# Patient Record
Sex: Male | Born: 1940 | Race: White | Hispanic: No | Marital: Married | Smoking: Never smoker
Health system: Southern US, Community
[De-identification: ages and names within clinical notes are randomized; demographics above are authoritative.]

## PROBLEM LIST (undated history)

## (undated) DIAGNOSIS — I2510 Atherosclerotic heart disease of native coronary artery without angina pectoris: Secondary | ICD-10-CM

## (undated) DIAGNOSIS — Z889 Allergy status to unspecified drugs, medicaments and biological substances status: Secondary | ICD-10-CM

## (undated) DIAGNOSIS — E559 Vitamin D deficiency, unspecified: Secondary | ICD-10-CM

## (undated) DIAGNOSIS — I10 Essential (primary) hypertension: Secondary | ICD-10-CM

## (undated) DIAGNOSIS — E785 Hyperlipidemia, unspecified: Secondary | ICD-10-CM

## (undated) DIAGNOSIS — K449 Diaphragmatic hernia without obstruction or gangrene: Secondary | ICD-10-CM

## (undated) DIAGNOSIS — N529 Male erectile dysfunction, unspecified: Secondary | ICD-10-CM

## (undated) DIAGNOSIS — I4891 Unspecified atrial fibrillation: Secondary | ICD-10-CM

## (undated) DIAGNOSIS — B359 Dermatophytosis, unspecified: Secondary | ICD-10-CM

## (undated) DIAGNOSIS — I429 Cardiomyopathy, unspecified: Secondary | ICD-10-CM

## (undated) DIAGNOSIS — K219 Gastro-esophageal reflux disease without esophagitis: Secondary | ICD-10-CM

## (undated) DIAGNOSIS — C61 Malignant neoplasm of prostate: Secondary | ICD-10-CM

## (undated) DIAGNOSIS — R7303 Prediabetes: Secondary | ICD-10-CM

## (undated) DIAGNOSIS — I739 Peripheral vascular disease, unspecified: Secondary | ICD-10-CM

## (undated) DIAGNOSIS — Z9289 Personal history of other medical treatment: Secondary | ICD-10-CM

## (undated) HISTORY — DX: Hyperlipidemia, unspecified: E78.5

## (undated) HISTORY — DX: Vitamin D deficiency, unspecified: E55.9

## (undated) HISTORY — DX: Malignant neoplasm of prostate: C61

## (undated) HISTORY — DX: Diaphragmatic hernia without obstruction or gangrene: K44.9

## (undated) HISTORY — DX: Prediabetes: R73.03

## (undated) HISTORY — PX: HERNIA REPAIR: SHX51

## (undated) HISTORY — DX: Personal history of other medical treatment: Z92.89

## (undated) HISTORY — DX: Dermatophytosis, unspecified: B35.9

## (undated) HISTORY — DX: Peripheral vascular disease, unspecified: I73.9

## (undated) HISTORY — PX: OTHER SURGICAL HISTORY: SHX169

## (undated) HISTORY — DX: Cardiomyopathy, unspecified: I42.9

## (undated) HISTORY — DX: Essential (primary) hypertension: I10

## (undated) HISTORY — DX: Atherosclerotic heart disease of native coronary artery without angina pectoris: I25.10

## (undated) HISTORY — DX: Male erectile dysfunction, unspecified: N52.9

---

## 1998-08-01 ENCOUNTER — Inpatient Hospital Stay (HOSPITAL_COMMUNITY): Admission: AD | Admit: 1998-08-01 | Discharge: 1998-08-05 | Payer: Self-pay | Admitting: Interventional Cardiology

## 1998-08-01 ENCOUNTER — Encounter: Payer: Self-pay | Admitting: Emergency Medicine

## 1998-08-01 DIAGNOSIS — I219 Acute myocardial infarction, unspecified: Secondary | ICD-10-CM

## 1998-08-01 HISTORY — DX: Acute myocardial infarction, unspecified: I21.9

## 1998-08-18 ENCOUNTER — Encounter (HOSPITAL_COMMUNITY): Admission: RE | Admit: 1998-08-18 | Discharge: 1998-11-16 | Payer: Self-pay | Admitting: Interventional Cardiology

## 2007-06-05 ENCOUNTER — Ambulatory Visit: Admission: RE | Admit: 2007-06-05 | Discharge: 2007-08-08 | Payer: Self-pay | Admitting: Radiation Oncology

## 2007-08-09 ENCOUNTER — Ambulatory Visit: Admission: RE | Admit: 2007-08-09 | Discharge: 2007-11-07 | Payer: Self-pay | Admitting: Radiation Oncology

## 2007-09-14 ENCOUNTER — Encounter: Admission: RE | Admit: 2007-09-14 | Discharge: 2007-09-14 | Payer: Self-pay | Admitting: Urology

## 2007-10-15 ENCOUNTER — Ambulatory Visit (HOSPITAL_BASED_OUTPATIENT_CLINIC_OR_DEPARTMENT_OTHER): Admission: RE | Admit: 2007-10-15 | Discharge: 2007-10-15 | Payer: Self-pay | Admitting: Urology

## 2007-11-21 ENCOUNTER — Ambulatory Visit: Admission: RE | Admit: 2007-11-21 | Discharge: 2007-12-09 | Payer: Self-pay | Admitting: Radiation Oncology

## 2010-08-30 ENCOUNTER — Encounter: Payer: Self-pay | Admitting: Internal Medicine

## 2010-09-15 NOTE — Letter (Addendum)
Summary: Letter from patient  Letter from patient   Imported By: Sherian Rein 09/10/2010 09:11:37  _____________________________________________________________________  External Attachment:    Type:   Image     Comment:   External Document  Appended Document: Letter from patient reviewed

## 2010-09-20 ENCOUNTER — Encounter (INDEPENDENT_AMBULATORY_CARE_PROVIDER_SITE_OTHER): Payer: Self-pay | Admitting: *Deleted

## 2010-09-22 ENCOUNTER — Encounter: Payer: Self-pay | Admitting: Internal Medicine

## 2010-09-29 NOTE — Letter (Signed)
Summary: [facility] Instructions  [facility]  [address] [place], [place] [address]   Phone: [phone]  Fax: [phone]       Ronald Duran    [birth year]    MRN: [id number]       Procedure Day Dorna Bloom:  Jake Shark  10/05/10     Arrival Time:   8:30AM     Procedure Time:  9:30AM     Location of Procedure:                    _ X_  [facility] (4th Floor)   PREPARATION FOR COLONOSCOPY WITH MIRALAX  Starting 5 days prior to your procedure 09/30/10 do not eat nuts, seeds, popcorn, corn, beans, peas,  salads, or any raw vegetables.  Do not take any fiber supplements (e.g. Metamucil, Citrucel, and Benefiber). ____________________________________________________________________________________________________   THE DAY BEFORE YOUR PROCEDURE         DATE: 10/04/10   DAY: MONDAY  1   Drink clear liquids the entire day-NO SOLID FOOD  2   Do not drink anything colored red or purple.  Avoid juices with pulp.  No orange juice.  3   Drink at least 64 oz. (8 glasses) of fluid/clear liquids during the day to prevent dehydration and help the prep work efficiently.  CLEAR LIQUIDS INCLUDE: Water Jello Ice Popsicles Tea (sugar ok, no milk/cream) Powdered fruit flavored drinks Coffee (sugar ok, no milk/cream) Gatorade Juice: apple, white grape, white cranberry  Lemonade Clear bullion, consomm, broth Carbonated beverages (any kind) Strained chicken noodle soup Hard Candy  4   Mix the entire bottle of Miralax with 64 oz. of Gatorade/Powerade in the morning and put in the refrigerator to chill.  5   At 3:00 pm take 2 Dulcolax/Bisacodyl tablets.  6   At 4:30 pm take one Reglan/Metoclopramide tablet.  7  Starting at 5:00 pm drink one 8 oz glass of the Miralax mixture every 15-20 minutes until you have finished drinking the entire 64 oz.  You should finish drinking prep around 7:30 or 8:00 pm.  8   If you are nauseated, you may take the 2nd Reglan/Metoclopramide  tablet at 6:30 pm.        9    At 8:00 pm take 2 more DULCOLAX/Bisacodyl tablets.     THE DAY OF YOUR PROCEDURE      DATE:  10/05/10  DAY: Jake Shark  You may drink clear liquids until 7:30AM  (2 HOURS BEFORE PROCEDURE).   MEDICATION INSTRUCTIONS  Unless otherwise instructed, you should take regular prescription medications with a small sip of water as early as possible the morning of your procedure.         OTHER INSTRUCTIONS  You will need a responsible adult at least 68 years of age to accompany you and drive you home.   This person must remain in the waiting room during your procedure.  Wear loose fitting clothing that is easily removed.  Leave jewelry and other valuables at home.  However, you may wish to bring a book to read or an iPod/MP3 player to listen to music as you wait for your procedure to start.  Remove all body piercing jewelry and leave at home.  Total time from sign-in until discharge is approximately 2-3 hours.  You should go home directly after your procedure and rest.  You can resume normal activities the day after your procedure.  The day of your procedure you should not:  Drive   Make legal decisions   Operate machinery   Drink alcohol   Return to work  You will receive specific instructions about eating, activities and medications before you leave.   The above instructions have been reviewed and explained to me by   Wyona Almas RN  September 22, 2010 9:56 AM     I fully understand and can verbalize these instructions _____________________________ Date _______

## 2010-09-29 NOTE — Miscellaneous (Signed)
Summary: [facility] Previsit/prep  Clinical Lists Changes  Medications: Added new medication of DULCOLAX 5 MG  TBEC (BISACODYL) Day before procedure take 2 at 3pm and 2 at 8pm. - Signed Added new medication of METOCLOPRAMIDE HCL 10 MG  TABS (METOCLOPRAMIDE HCL) As per prep instructions. - Signed Added new medication of MIRALAX   POWD (POLYETHYLENE GLYCOL 3350) As per prep  instructions. - Signed Rx of DULCOLAX 5 MG  TBEC (BISACODYL) Day before procedure take 2 at 3pm and 2 at 8pm.;  #4 x 0;  Signed;  Entered by: Wyona Almas RN;  Authorized by: Hart Carwin MD;  Method used: Electronically to [facility]. #[id number]*, [address] [place], [place], [place]  [address], Ph: [phone], Fax: [phone] Rx of METOCLOPRAMIDE HCL 10 MG  TABS (METOCLOPRAMIDE HCL) As per prep instructions.;  #2 x 0;  Signed;  Entered by: Wyona Almas RN;  Authorized by: Hart Carwin MD;  Method used: Electronically to [facility]. #[id number]*, [address], [place], [place], [place]  [address], Ph: [phone], Fax: [phone] Rx of MIRALAX   POWD (POLYETHYLENE GLYCOL 3350) As per prep  instructions.;  #255gm x 0;  Signed;  Entered by: Wyona Almas RN;  Authorized by: Hart Carwin MD;  Method used: Electronically to [facility] #[id number]*, [address], [place], [place], [place]  [address], Ph: [phone], Fax: [phone] Observations: Added new observation of NKA: T (09/22/2010 9:26)    Prescriptions: MIRALAX   POWD (POLYETHYLENE GLYCOL 3350) As per prep  instructions.  #255gm x 0   Entered by:   Wyona Almas RN   Authorized by:   Hart Carwin MD   Signed by:   Wyona Almas RN on 09/22/2010   Method used:   Electronically to        [facility]. [phone]* (retail)       [address]       [place], [place]  [address]       Ph: [phone]       Fax: [phone]   RxID:   [id number] METOCLOPRAMIDE HCL 10 MG  TABS  (METOCLOPRAMIDE HCL) As per prep instructions.  #2 x 0   Entered by:   Wyona Almas RN   Authorized by:   Hart Carwin MD   Signed by:   Wyona Almas RN on 09/22/2010   Method used:   Electronically to        [facility]. [phone]* (retail)       [address]       [place], [place]  [address]       Ph: [phone]       Fax: [phone]   RxID:   [id number] DULCOLAX 5 MG  TBEC (BISACODYL) Day before procedure take 2 at 3pm and 2 at 8pm.  #4 x 0   Entered by:   Wyona Almas RN   Authorized by:   Hart Carwin MD   Signed by:   Wyona Almas RN on 09/22/2010   Method used:   Electronically to        [facility]. [phone]* (retail)       [address]       [place], [place]  [address]       Ph: [phone]  Fax: [phone]   RxID:   [id number]

## 2010-10-05 ENCOUNTER — Other Ambulatory Visit: Payer: Self-pay | Admitting: Internal Medicine

## 2010-10-05 ENCOUNTER — Other Ambulatory Visit (AMBULATORY_SURGERY_CENTER): Payer: Medicare Other | Admitting: Internal Medicine

## 2010-10-05 DIAGNOSIS — Z1211 Encounter for screening for malignant neoplasm of colon: Secondary | ICD-10-CM

## 2010-10-05 DIAGNOSIS — D128 Benign neoplasm of rectum: Secondary | ICD-10-CM

## 2010-10-05 DIAGNOSIS — D129 Benign neoplasm of anus and anal canal: Secondary | ICD-10-CM

## 2010-10-05 DIAGNOSIS — D126 Benign neoplasm of colon, unspecified: Secondary | ICD-10-CM

## 2010-10-11 ENCOUNTER — Encounter: Payer: Self-pay | Admitting: Internal Medicine

## 2010-10-14 NOTE — Procedures (Addendum)
Summary: Colonoscopy  Patient: Ronald Duran Note: All result statuses are Final unless otherwise noted.  Tests: (1) Colonoscopy (COL)   COL Colonoscopy           DONE     [facility]     [address]     [place], [place]  [address]          COLONOSCOPY PROCEDURE REPORT          PATIENT:  Ronald, Duran  MR#:  [id number]     BIRTHDATE:  1941-05-27, 69 yrs. old  GENDER:  male     ENDOSCOPIST:  Hedwig Morton. Juanda Chance, MD     REF. BY:  R. Robley Fries, M.D.     PROCEDURE DATE:  10/05/2010     PROCEDURE:  Colonoscopy 44010     ASA CLASS:  Class II     INDICATIONS:  Routine Risk Screening last colon 2001 was normal,     s/p prostate cancer, s/p radioactive seed implants 3 years ago     MEDICATIONS:   Versed 6 mg, Fentanyl 50 mcg          DESCRIPTION OF PROCEDURE:   After the risks benefits and     alternatives of the procedure were thoroughly explained, informed     consent was obtained.  Digital rectal exam was performed and     revealed no rectal masses.   The LB [id number]-[id number] endoscope     was introduced through the anus and advanced to the cecum, which     was identified by both the appendix and ileocecal valve, without     limitations.  The quality of the prep was good, using MiraLax.     The instrument was then slowly withdrawn as the colon was fully     examined.     <<PROCEDUREIMAGES>>          FINDINGS:  Two polyps were found. at 5 cm and 15 cm sessile 4 mm     polyps The polyps were removed using cold biopsy forceps (see     image6, image5, and image4).  This was otherwise a normal     examination of the colon (see image3, image2, and image1).     Retroflexed views in the rectum revealed no abnormalities.    The     scope was then withdrawn from the patient and the procedure     completed.          COMPLICATIONS:  None     ENDOSCOPIC IMPRESSION:     1) Two polyps     2) Otherwise normal examination     no evidence of radiation proctitis  RECOMMENDATIONS:     1) Await pathology results     2) High fiber diet.     REPEAT EXAM:  In 5 - 10 year(s) for.  recall interval depending on     the pathology          ______________________________     Hedwig Morton. Juanda Chance, MD          CC:          n.     eSIGNED:   Hedwig Morton. Larah Kuntzman at 10/05/2010 10:14 AM          Elberta Fortis, [phone]  Note: An exclamation mark (!) indicates a result that was not dispersed into the flowsheet. Document Creation Date: 10/05/2010 10:14 AM _______________________________________________________________________  (1) Order result status: Final Collection or observation date-time: 10/05/2010 10:05  Requested date-time:  Receipt date-time:  Reported date-time:  Referring Physician:   Ordering Physician: [name] [phone]) Specimen Source:  Source: Launa Grill Order Number: [id number] Lab site:   Appended Document: Colonoscopy     Procedures Next Due Date:    Colonoscopy: 10/2015

## 2010-10-19 NOTE — Letter (Signed)
Summary: Patient Notice- Polyp Results  [facility]  [address] [place], [place] [address]   Phone: [phone]  Fax: [phone]        October 11, 2010 MRN: [id number]    Ronald Duran [address] [place], [place]  [address]    Dear [name],  I am pleased to inform you that the colon polyp(s) removed during your recent colonoscopy was (were) found to be benign (no cancer detected) upon pathologic examination.The polyp was adenomatous, precancerous.  I recommend you have a repeat colonoscopy examination in 5_ years to look for recurrent polyps, as having colon polyps increases your risk for having recurrent polyps or even colon cancer in the future.  Should you develop new or worsening symptoms of abdominal pain, bowel habit changes or bleeding from the rectum or bowels, please schedule an evaluation with either your primary care physician or with me.  Additional information/recommendations:  _x_ No further action with gastroenterology is needed at this time. Please      follow-up with your primary care physician for your other healthcare      needs.  __ Please call [phone] to schedule a return visit to review your      situation.  __ Please keep your follow-up visit as already scheduled.  __ Continue treatment plan as outlined the day of your exam.  Please call us if you are having persistent problems or have questions about your condition that have not been fully answered at this time.  Sincerely,  Hart Carwin MD  This letter has been electronically signed by your physician.  Appended Document: Patient Notice- Polyp Results letter mailed

## 2010-12-16 ENCOUNTER — Other Ambulatory Visit: Payer: Self-pay | Admitting: Dermatology

## 2010-12-21 NOTE — Op Note (Signed)
NAME:  Ronald Duran, Ronald Duran NO.:  [id number]   MEDICAL RECORD NO.:  [id number]          PATIENT TYPE:  AMB   LOCATION:  [facility]                         FACILITY:  [facility]   PHYSICIAN:  Valetta Fuller, M.D.  DATE OF BIRTH:  [birth year]   DATE OF PROCEDURE:  10/15/2007  DATE OF DISCHARGE:                               OPERATIVE REPORT   PREOPERATIVE DIAGNOSIS:  Clinical stage T1C adenocarcinoma prostate.   POSTOPERATIVE DIAGNOSIS:  Clinical stage T1C adenocarcinoma prostate.   PROCEDURE PERFORMED:  1. I-125 seed implantation of the prostate utilizing a robotic      nucletron robotic implanter.  2. Flexible cystoscopy.   SURGEON:  Valetta Fuller, M.D.   ASSISTANT:  Maryln Gottron, M.D.   ANESTHESIA:  General.   INDICATIONS:  Ronald Duran is a 70 year old male.  He was diagnosed with  favorable clinical stage T1C adenocarcinoma of the prostate.  At the  time of biopsy, the patient had a 65 gram gland.  PSA was only minimally  elevated at about 5.2.  Biopsy was positive but only 1 out of 12 cores  was positive for Gleason 3+3=6 tumor involving only about 5% of biopsy  material.  We talked about surveillance versus attempt at curative  therapy.  We did feel the surgical approach would probably be over  aggressive given this pathology.  We did feel that if we could get a  reasonable downsizing of his prostate that it would be reasonable to  consider I-125 brachytherapy.  The patient was diagnosed in October of  last year.  The patient did receive Lupron for downsizing.  He  essentially followed up with Dr. [name] and his prostate was in the 35-  40 grams range.  He was felt to be a candidate for implantation based on  his CT ARCH study.  He presents now for the procedure.   TECHNIQUE AND FINDINGS:  The patient was brought to the operating room  where he had successful induction of general anesthesia.  He received  ciprofloxacin.  He was placed in a moderate lithotomy  position and  prepped and draped in the usual manner.  Dr. [name] in the radiation  oncology team initiated the procedure.  Transrectal ultrasound probe was  placed and anchored to a stand.  Anchoring needles were also placed in  the prostate.  Real time imaging and contouring of the prostate, rectum  and urethra was then performed.  Intraoperative plan was created.  Once  they were very satisfied with the plan, we came into the operating room.  A total of 24 needles were placed.  Each needle was placed with direct  real time ultrasound guidance.  Once needle positioning was confirmed  then the nucletron robotic implanter implanted the seeds.  A total  number of 72 active seeds was implanted.  No complications or problems  occurred.  At the completion of procedure, fluoroscopic imaging revealed  an excellent distribution of the seeds.  Representative pictures were  taken.  The Foley catheter which had previously been placed with the  contrast in the balloon was removed.  Flexible cystoscopy was performed.  There were no seeds noted  within the prostatic urethra or bladder.  Lidocaine jelly was instilled  per urethra and a new catheter was placed which drained clear urine.  The patient was brought to recovery room in stable condition.  No  obvious complications or problems occurred.           ______________________________  Valetta Fuller, M.D.  Electronically Signed     DSG/MEDQ  D:  10/15/2007  T:  10/16/2007  Job:  [id number]

## 2011-05-02 LAB — CBC
HCT: 43.2
Hemoglobin: 15.1
MCHC: 35
MCV: 92.5
Platelets: 121 — ABNORMAL LOW
RBC: 4.68
RDW: 13.2
WBC: 10

## 2011-05-02 LAB — COMPREHENSIVE METABOLIC PANEL
ALT: 37
AST: 31
Albumin: 3.9
Calcium: 9.5
Chloride: 106
Creatinine, Ser: 1.14
GFR calc Af Amer: >60
Sodium: 140

## 2011-05-02 LAB — APTT: aPTT: 28

## 2013-06-11 ENCOUNTER — Encounter: Payer: Self-pay | Admitting: *Deleted

## 2013-06-11 DIAGNOSIS — E785 Hyperlipidemia, unspecified: Secondary | ICD-10-CM | POA: Insufficient documentation

## 2013-06-11 DIAGNOSIS — I5022 Chronic systolic (congestive) heart failure: Secondary | ICD-10-CM | POA: Insufficient documentation

## 2013-06-11 DIAGNOSIS — C61 Malignant neoplasm of prostate: Secondary | ICD-10-CM | POA: Insufficient documentation

## 2013-06-11 DIAGNOSIS — E559 Vitamin D deficiency, unspecified: Secondary | ICD-10-CM | POA: Insufficient documentation

## 2013-06-11 DIAGNOSIS — B359 Dermatophytosis, unspecified: Secondary | ICD-10-CM | POA: Insufficient documentation

## 2013-06-11 DIAGNOSIS — N529 Male erectile dysfunction, unspecified: Secondary | ICD-10-CM | POA: Insufficient documentation

## 2013-06-11 DIAGNOSIS — I10 Essential (primary) hypertension: Secondary | ICD-10-CM | POA: Insufficient documentation

## 2013-06-11 DIAGNOSIS — I2510 Atherosclerotic heart disease of native coronary artery without angina pectoris: Secondary | ICD-10-CM | POA: Insufficient documentation

## 2013-06-11 DIAGNOSIS — R7303 Prediabetes: Secondary | ICD-10-CM | POA: Insufficient documentation

## 2013-06-11 DIAGNOSIS — K449 Diaphragmatic hernia without obstruction or gangrene: Secondary | ICD-10-CM | POA: Insufficient documentation

## 2013-06-11 DIAGNOSIS — E782 Mixed hyperlipidemia: Secondary | ICD-10-CM | POA: Insufficient documentation

## 2013-06-12 ENCOUNTER — Encounter: Payer: Self-pay | Admitting: Interventional Cardiology

## 2013-06-12 ENCOUNTER — Ambulatory Visit (INDEPENDENT_AMBULATORY_CARE_PROVIDER_SITE_OTHER): Payer: Medicare Other | Admitting: Interventional Cardiology

## 2013-06-12 VITALS — BP 140/82 | HR 55 | Ht 73.0 in | Wt 207.0 lb

## 2013-06-12 DIAGNOSIS — M25511 Pain in right shoulder: Secondary | ICD-10-CM

## 2013-06-12 DIAGNOSIS — I2510 Atherosclerotic heart disease of native coronary artery without angina pectoris: Secondary | ICD-10-CM

## 2013-06-12 DIAGNOSIS — E785 Hyperlipidemia, unspecified: Secondary | ICD-10-CM

## 2013-06-12 DIAGNOSIS — M25519 Pain in unspecified shoulder: Secondary | ICD-10-CM

## 2013-06-12 DIAGNOSIS — I10 Essential (primary) hypertension: Secondary | ICD-10-CM

## 2013-06-12 NOTE — Progress Notes (Signed)
Patient ID: Ronald Duran, male   DOB: 12/11/1940, 72 y.o.   MRN: [id number]    [address] [place], [place]  [address] Phone: [phone] Fax:  [phone]  Date:  06/12/2013   ID:  Ronald Duran, DOB 1941-01-18, MRN [id number]  PCP:  Pearla Dubonnet, MD   ASSESSMENT:  1. coronary artery disease, asymptomatic 2. Hypertension, controlled 3. Hyperlipidemia followed by primary physician, Shaune Pollack 4. Right shoulder discomfort, felt to be mechanical and 9 cardiac related. Okay to use ibuprofen in a limited fashion  PLAN:  1. I encouraged aerobic activities such as walking and biking 2. One-year clinical followup 3. Call of chest discomfort 4. Okay to use ibuprofen in a limited manner for the right shoulder discomfort   SUBJECTIVE: Ronald Duran is a 72 y.o. male is doing well. His only trouble his right arm/shoulder discomfort while playing tennis and golf. Movement exacerbates her discomfort. It improved after a steroid injection at [facility]. He denies orthopnea, palpitations, transient neurological symptoms, angina, lower extremity edema, and other complaints.   Wt Readings from Last 3 Encounters:  06/12/13 207 lb (93.895 kg)     Past Medical History  Diagnosis Date  . Erectile dysfunction   . Hyperlipidemia   . HTN (hypertension)   . Ringworm   . Vitamin D deficiency   . Pre-diabetes   . CAD (coronary artery disease)     status post MI with LAD stent placement December, 1999 -last stress test November 2008 with no EKG evidence of ischemia   . Prostate cancer     with seed implant therapy following Lupron therapy in 2008 and 2009 - followed by Dr. Isabel Caprice  . Hiatal hernia     with esophageal strictures and dilatations in the past - Dr. [name]  . Dyslipidemia   . Cardiomyopathy     Ischemic cardiomyopathy with EF 40% by echo 2005    Current Outpatient Prescriptions  Medication Sig Dispense Refill  . aspirin 81 MG tablet  Take 81 mg by mouth daily.      . B Complex Vitamins (B COMPLEX 100) tablet Take 1 tablet by mouth daily.      . benazepril-hydrochlorthiazide (LOTENSIN HCT) 20-12.5 MG per tablet daily.       . cetirizine (ZYRTEC) 10 MG tablet Take 10 mg by mouth daily.      . Cholecalciferol (VITAMIN D3) 2000 UNITS TABS Take by mouth.      . Coenzyme Q10 (CO Q-10) 100 MG CAPS Take by mouth.      . fish oil-omega-3 fatty acids 1000 MG capsule Take by mouth daily. 28000 iu's      . FLUZONE HIGH-DOSE injection Flu  shot      . metoprolol (LOPRESSOR) 50 MG tablet 25 mg daily. in the morning and 50 mg in the evening      . Multiple Vitamins-Minerals (CENTRUM) tablet Take 1 tablet by mouth daily.      . niacin (NIASPAN) 500 MG CR tablet 1,000 mg Nightly.       . nitroGLYCERIN (NITROSTAT) 0.4 MG SL tablet Place 0.4 mg under the tongue every 5 (five) minutes as needed for chest pain.      Marland Kitchen omeprazole (PRILOSEC) 20 MG capsule 20 mg daily.       . simvastatin (ZOCOR) 80 MG tablet 40 mg daily.        No current facility-administered medications for this visit.    Allergies:  No Known Allergies  Social History:  The patient  reports that he has never smoked. He does not have any smokeless tobacco history on file.   ROS:  Please see the history of present illness.   Absent   All other systems reviewed and negative.   OBJECTIVE: VS:  BP 140/82  Pulse 55  Ht 6\' 1"  (1.854 m)  Wt 207 lb (93.895 kg)  BMI 27.32 kg/m2 Well nourished, well developed, in no acute distress, appearing younger than stated age HEENT: normal Neck: JVD flat. Carotid bruit 2+  Cardiac:  normal S1, S2; RRR; no murmur Lungs:  clear to auscultation bilaterally, no wheezing, rhonchi or rales Abd: soft, nontender, no hepatomegaly. No pulsatile masses Ext: Edema absent. Pulses 2+ Skin: warm and dry Neuro:  CNs 2-12 intact, no focal abnormalities noted  EKG:  Normal sinus rhythm with old anteroseptal myocardial infarction        Signed, Darci Needle III, MD 06/12/2013 10:39 AM

## 2013-06-12 NOTE — Patient Instructions (Signed)
Your physician recommends that you continue on your current medications as directed. Please refer to the Current Medication list given to you today.  Your physician wants you to follow-up in: 1 year You will receive a reminder letter in the mail two months in advance. If you don't receive a letter, please call our office to schedule the follow-up appointment.  Continue to stay active and monitor your diet

## 2013-07-30 ENCOUNTER — Other Ambulatory Visit: Payer: Self-pay

## 2013-07-30 MED ORDER — BENAZEPRIL-HYDROCHLOROTHIAZIDE 20-12.5 MG PO TABS: 1.0000 | ORAL_TABLET | Freq: Every day | ORAL | Status: DC

## 2014-03-11 ENCOUNTER — Encounter: Payer: Self-pay | Admitting: Interventional Cardiology

## 2014-03-12 ENCOUNTER — Other Ambulatory Visit: Payer: Self-pay

## 2014-03-12 MED ORDER — BENAZEPRIL-HYDROCHLOROTHIAZIDE 20-12.5 MG PO TABS: 1.0000 | ORAL_TABLET | Freq: Every day | ORAL | Status: DC

## 2014-06-12 ENCOUNTER — Encounter: Payer: Self-pay | Admitting: Interventional Cardiology

## 2014-06-12 ENCOUNTER — Ambulatory Visit (INDEPENDENT_AMBULATORY_CARE_PROVIDER_SITE_OTHER): Payer: Medicare Other | Admitting: Interventional Cardiology

## 2014-06-12 VITALS — BP 144/82 | HR 55 | Ht 72.0 in | Wt 211.0 lb

## 2014-06-12 DIAGNOSIS — I5042 Chronic combined systolic (congestive) and diastolic (congestive) heart failure: Secondary | ICD-10-CM

## 2014-06-12 DIAGNOSIS — I10 Essential (primary) hypertension: Secondary | ICD-10-CM

## 2014-06-12 DIAGNOSIS — I2510 Atherosclerotic heart disease of native coronary artery without angina pectoris: Secondary | ICD-10-CM

## 2014-06-12 DIAGNOSIS — E785 Hyperlipidemia, unspecified: Secondary | ICD-10-CM

## 2014-06-12 NOTE — Patient Instructions (Signed)
Your physician recommends that you continue on your current medications as directed. Please refer to the Current Medication list given to you today.  Your physician has requested that you have an exercise tolerance test. For further information please visit [email or web address]. Please also follow instruction sheet, as given.( To be scheduled in November 2016)  Your physician wants you to follow-up in: 2 years with Dr.Smith You will receive a reminder letter in the mail two months in advance. If you don't receive a letter, please call our office to schedule the follow-up appointment.

## 2014-06-12 NOTE — Progress Notes (Signed)
Patient ID: Ronald Duran, male   DOB: 06-04-1941, 73 y.o.   MRN: [id number]    [address] [place], [place]  [address] Phone: [phone] Fax:  [phone]  Date:  06/12/2014   ID:  Ronald Duran, DOB [birth year], MRN [id number]  PCP:  Henrine Screws, MD   ASSESSMENT:  1. Coronary atherosclerotic heart disease stable without angina 2. Hyperlipidemia  3. Hypertension, essential, controlled 4. Combined chronic systolic and diastolic heart failure  PLAN:  1. No change in current medical regimen  2. Excess treadmill test in 12 months 3. Office visit with me in 2 years unless symptoms develop in the interim    SUBJECTIVE: Ronald Duran is a 73 y.o. male who is doing well. He has no limitations when he plays golf or exercises. He denies orthopnea, PND, claudication, palpitations, and syncope.   Wt Readings from Last 3 Encounters:  06/12/14 211 lb (95.709 kg)  06/12/13 207 lb (93.895 kg)     Past Medical History  Diagnosis Date  . Erectile dysfunction   . Hyperlipidemia   . HTN (hypertension)   . Ringworm   . Vitamin D deficiency   . Pre-diabetes   . CAD (coronary artery disease)     status post MI with LAD stent placement December, 1999 -last stress test November 2008 with no EKG evidence of ischemia   . Prostate cancer     with seed implant therapy following Lupron therapy in 2008 and 2009 - followed by [name]  . Hiatal hernia     with esophageal strictures and dilatations in the past - Dr. Delfin Edis  . Dyslipidemia   . Cardiomyopathy     Ischemic cardiomyopathy with EF 40% by echo 2005    Current Outpatient Prescriptions  Medication Sig Dispense Refill  . aspirin 81 MG tablet Take 81 mg by mouth daily.    . B Complex Vitamins (B COMPLEX 100) tablet Take 1 tablet by mouth daily.    . benazepril-hydrochlorthiazide (LOTENSIN HCT) 20-12.5 MG per tablet Take 1 tablet by mouth daily. 90 tablet 1  . cetirizine (ZYRTEC) 10 MG tablet  Take 10 mg by mouth daily.    . Cholecalciferol (VITAMIN D3) 2000 UNITS TABS Take by mouth.    . Coenzyme Q10 (CO Q-10) 100 MG CAPS Take by mouth.    . fish oil-omega-3 fatty acids 1000 MG capsule Take by mouth daily. 28000 iu's    . FLUZONE HIGH-DOSE injection Flu  shot    . metoprolol (LOPRESSOR) 50 MG tablet 25 mg daily. in the morning and 50 mg in the evening    . Multiple Vitamins-Minerals (CENTRUM) tablet Take 1 tablet by mouth daily.    . niacin (NIASPAN) 500 MG CR tablet 1,000 mg Nightly.     . nitroGLYCERIN (NITROSTAT) 0.4 MG SL tablet Place 0.4 mg under the tongue every 5 (five) minutes as needed for chest pain.    Marland Kitchen omeprazole (PRILOSEC) 20 MG capsule 20 mg daily.     . simvastatin (ZOCOR) 80 MG tablet 40 mg daily.      No current facility-administered medications for this visit.    Allergies:   No Known Allergies  Social History:  The patient  reports that he has never smoked. He does not have any smokeless tobacco history on file.   ROS:  Please see the history of present illness.    There is no peripheral edema. Denies transient neurological symptoms. No abdominal swelling.  All other systems reviewed and negative.   OBJECTIVE: VS:  BP 144/82 mmHg  Pulse 55  Ht 6' (1.829 m)  Wt 211 lb (95.709 kg)  BMI 28.61 kg/m2 Well nourished, well developed, in no acute distress, younger than stated age 38: normal Neck: JVD flat. Carotid bruit absent  Cardiac:  normal S1, S2; RRR; no murmur Lungs:  clear to auscultation bilaterally, no wheezing, rhonchi or rales Abd: soft, nontender, no hepatomegaly Ext: Edema absent. Pulses 2+ and symmetric Skin: warm and dry Neuro:  CNs 2-12 intact, no focal abnormalities noted  EKG:  Normal sinus rhythm with poor R-wave progression V1 through V5, otherwise no change.   Ronald Desanctis III, MD 06/12/2014 8:48 AM

## 2014-09-12 ENCOUNTER — Other Ambulatory Visit: Payer: Self-pay | Admitting: *Deleted

## 2014-09-12 ENCOUNTER — Encounter: Payer: Self-pay | Admitting: Interventional Cardiology

## 2014-09-12 MED ORDER — BENAZEPRIL-HYDROCHLOROTHIAZIDE 20-12.5 MG PO TABS: 1.0000 | ORAL_TABLET | Freq: Every day | ORAL | Status: DC

## 2015-02-02 ENCOUNTER — Other Ambulatory Visit: Payer: Self-pay

## 2015-02-23 ENCOUNTER — Other Ambulatory Visit: Payer: Self-pay | Admitting: Interventional Cardiology

## 2015-02-26 ENCOUNTER — Encounter: Payer: Self-pay | Admitting: Internal Medicine

## 2015-02-27 ENCOUNTER — Encounter: Payer: Self-pay | Admitting: Internal Medicine

## 2015-03-10 ENCOUNTER — Other Ambulatory Visit: Payer: Self-pay | Admitting: *Deleted

## 2015-03-10 DIAGNOSIS — I2510 Atherosclerotic heart disease of native coronary artery without angina pectoris: Secondary | ICD-10-CM

## 2015-06-09 ENCOUNTER — Other Ambulatory Visit: Payer: Self-pay | Admitting: Internal Medicine

## 2015-06-09 DIAGNOSIS — I739 Peripheral vascular disease, unspecified: Secondary | ICD-10-CM

## 2015-06-10 ENCOUNTER — Ambulatory Visit
Admission: RE | Admit: 2015-06-10 | Discharge: 2015-06-10 | Disposition: A | Discharge status: Home / Self Care | Payer: Medicare Other | Source: Ambulatory Visit | Attending: Internal Medicine | Admitting: Internal Medicine

## 2015-06-10 ENCOUNTER — Encounter: Payer: Self-pay | Admitting: Physician Assistant

## 2015-06-10 DIAGNOSIS — I739 Peripheral vascular disease, unspecified: Secondary | ICD-10-CM

## 2015-06-15 ENCOUNTER — Other Ambulatory Visit: Payer: Self-pay | Admitting: Internal Medicine

## 2015-06-15 DIAGNOSIS — I739 Peripheral vascular disease, unspecified: Secondary | ICD-10-CM

## 2015-06-18 ENCOUNTER — Other Ambulatory Visit: Payer: Medicare Other

## 2015-06-23 ENCOUNTER — Other Ambulatory Visit: Payer: Self-pay | Admitting: Interventional Radiology

## 2015-06-23 ENCOUNTER — Ambulatory Visit
Admission: RE | Admit: 2015-06-23 | Discharge: 2015-06-23 | Disposition: A | Discharge status: Home / Self Care | Payer: Medicare Other | Source: Ambulatory Visit | Attending: Internal Medicine | Admitting: Internal Medicine

## 2015-06-23 DIAGNOSIS — I2510 Atherosclerotic heart disease of native coronary artery without angina pectoris: Secondary | ICD-10-CM

## 2015-06-23 DIAGNOSIS — I739 Peripheral vascular disease, unspecified: Secondary | ICD-10-CM | POA: Insufficient documentation

## 2015-06-23 NOTE — Consult Note (Signed)
Chief Complaint: Right leg pain with walking.  Referring Physician(s): Dr. Josetta Huddle  History of Present Illness: Ronald Duran is a 74 y.o. male presenting to vascular and interventional radiology clinic today for evaluation of right lower extremity pain with walking. He has been kindly referred by Dr. Josetta Huddle on the results of a noninvasive arterial examination demonstrating moderate right arterial occlusive disease.  Mr. Tano reports that he is quite active in retirement, and in September 2016, specifically September 20, he noticed painful right calf during a round of golf. Pain is only present with short distance walking, he estimates about a quarter mile, and is not present at rest. He has no complaints of prior lower extremity ulcers, and no complaints of sensory changes, muscle weakness, or a cold right lower extremity. He denies any left sided symptoms. Symptoms have been reliably reproducible since September when they first started with short distance ambulation.  Mr. Comolli is a gentleman with a history of vascular disease, with cardiovascular risk factors including coronary artery disease (myocardial infarction treated by Dr. Tamala Julian with coronary stenting in 1999), hypertension, dyslipidemia, family history.  Noninvasive arterial examination which was performed 06/10/2015 demonstrates right-sided resting ABI which is moderate range of arterial occlusive disease. Remainder of the study demonstrates evidence of superficial femoral artery occlusion or possibly high-grade stenosis. No significant left-sided disease.  Mr. Kulikowski has palpable pulses on exam although they are decreased.  His goals are to be able to ambulate comfortably with golf, as he states that he is playing less tennis these days. He frequently goes to the gym and walks on indoor track, which she finds to be uncomfortable after 3/10 of a mile.  Past Medical History  Diagnosis Date  . Erectile  dysfunction   . Hyperlipidemia   . HTN (hypertension)   . Ringworm   . Vitamin D deficiency   . Pre-diabetes   . CAD (coronary artery disease)     status post MI with LAD stent placement December, 1999 -last stress test November 2008 with no EKG evidence of ischemia   . Prostate cancer [facility])     with seed implant therapy following Lupron therapy in 2008 and 2009 - followed by [name]  . Hiatal hernia     with esophageal strictures and dilatations in the past - Dr. Delfin Edis  . Dyslipidemia   . Cardiomyopathy ([place])     Ischemic cardiomyopathy with EF 40% by echo 2005    Past Surgical History  Procedure Laterality Date  . Lad stent placement - dr. Daneen Schick    . Esophageal strictures with dilatations in the past per dr. Delfin Edis    . Prostatic radioactive seed implantation - dr. Valere Dross    . Left inguinal hernia repair - dr. Magdalene River      Allergies: Review of patient's allergies indicates no known allergies.  Medications: Prior to Admission medications   Medication Sig Start Date End Date Taking? Authorizing Provider  aspirin 81 MG tablet Take 81 mg by mouth daily.    Historical Provider, MD  B Complex Vitamins (B COMPLEX 100) tablet Take 1 tablet by mouth daily.    Historical Provider, MD  benazepril-hydrochlorthiazide (LOTENSIN HCT) 20-12.5 MG per tablet Take 1 tablet by mouth daily. 09/12/14   Belva Crome, MD  cetirizine (ZYRTEC) 10 MG tablet Take 10 mg by mouth daily.    Historical Provider, MD  Cholecalciferol (VITAMIN D3) 2000 UNITS TABS Take by mouth.  Historical Provider, MD  Coenzyme Q10 (CO Q-10) 100 MG CAPS Take by mouth.    Historical Provider, MD  fish oil-omega-3 fatty acids 1000 MG capsule Take by mouth daily. 28000 iu's    Historical Provider, MD  FLUZONE HIGH-DOSE injection Flu  shot 04/19/13   Historical Provider, MD  metoprolol (LOPRESSOR) 50 MG tablet 25 mg daily. in the morning and 50 mg in the evening 03/19/13   Historical Provider, MD    Multiple Vitamins-Minerals (CENTRUM) tablet Take 1 tablet by mouth daily.    Historical Provider, MD  niacin (NIASPAN) 500 MG CR tablet 1,000 mg Nightly.  03/19/13   Historical Provider, MD  NITROSTAT 0.4 MG SL tablet place 1 tablet under the tongue if needed every 5 minutes for chest pain for 3 doses IF NO RELIEF AFTER 3RD DOSE CALL PRESCRIBER OR 911. 02/24/15   Belva Crome, MD  omeprazole (PRILOSEC) 20 MG capsule 20 mg daily.  05/13/13   Historical Provider, MD  simvastatin (ZOCOR) 80 MG tablet 40 mg daily.  03/19/13   Historical Provider, MD     Family History  Problem Relation Age of Onset  . CAD      Social History   Social History  . Marital Status: Married    Spouse Name: N/A  . Number of Children: N/A  . Years of Education: N/A   Social History Main Topics  . Smoking status: Never Smoker   . Smokeless tobacco: Never Used  . Alcohol Use: 0.0 oz/week    0 Standard drinks or equivalent per week     Comment: occasionally for special occasions    . Drug Use: Not on file  . Sexual Activity: Not on file   Other Topics Concern  . Not on file   Social History Narrative       Review of Systems: A 12 point ROS discussed and pertinent positives are indicated in the HPI above.  All other systems are negative.  Review of Systems  Vital Signs: BP 145/86 mmHg  Pulse 57  Temp(Src) 97.8 F (36.6 C) (Oral)  Resp 14  Ht 6' 0.5" (1.842 m)  Wt 203 lb (92.08 kg)  BMI 27.14 kg/m2  SpO2 94%  Physical Exam   Atraumatic, normocephalic. Mucous membranes moist pink. Conjugate gaze. No scleral icterus or scleral injection. Full range of motion of the cervical region. Alert and oriented to person place and time. Appropriate insight and questions. Moving all 4 extremities equally. Gross motor intact. Gross sensory intact. Clear to auscultation bilaterally with no wheezes rhonchi or rails. Regular rate and rhythm with no third heart sound appreciated. No bruit was auscultated at  the left or right neck. Abdomen soft nontender with no pulsatile mass. No guarding or rigidity. Genitourinary deferred. Palpable pulses at the bilateral pedal, though decreased on the right. Bilateral radial pulses palpable. No swelling of the lower extremities. No open wound or ulceration, including the intertriginous regions. Flaking skin. No telangiectasias or stasis dermatitis. No varicosities. Bilateral thickened nails.  Mallampati Score:  2  Imaging: US Arterial Seg Multiple  06/10/2015  CLINICAL DATA:  74 year old male with a history of claudication. Cardiovascular risk factors include prior Coronary artery disease, known vascular disease. EXAM: NONINVASIVE PHYSIOLOGIC VASCULAR STUDY OF BILATERAL LOWER EXTREMITIES TECHNIQUE: Evaluation of both lower extremities was performed at rest, including calculation of ankle-brachial indices, multiple segmental pressure evaluation, segmental Doppler and segmental pulse volume recording. COMPARISON:  None. FINDINGS: Right: Resting ankle brachial index:  0.80 Segmental blood pressure:  Symmetric upper extremity brachial pressures. Appropriate increase in the right thigh compared to the arm. Significant drop from low thigh the calf. Doppler: Segmental Doppler of the right lower extremity demonstrates patent common femoral artery with triphasic waveform. Monophasic waveform of the popliteal, posterior tibial and dorsalis pedis artery. Pulse volume recording: Segmental PVR of the right lower extremity demonstrates maintained amplitude in the thigh. Loss of augmentation from high thigh to below knee. Deterioration at the ankle and right great toe. Left: Resting ankle brachial index: 1.10 Segmental blood pressure: Symmetric upper extremity pressures. Appropriate increase in the thigh from the upper extremity. Doppler: Segmental Doppler of the left lower extremity demonstrates patent common femoral artery with triphasic waveform. Triphasic waveform of the popliteal  artery, posterior tibial artery, and dorsalis pedis. Pulse volume recording: Segmental PVR on the left demonstrates maintained waveform in high thigh and below knee with augmentation maintained. Ankle segment maintained with decreased amplitude at the left great toe. Additional: IMPRESSION: Right: Resting ankle-brachial index demonstrates mild arterial occlusive disease, though the value would likely decrease after exercise. Remainder of the study demonstrates either SFA or proximal popliteal high-grade stenosis or occlusion, with likely developing tibial disease. Left: No evidence of significant arterial disease by resting ABI. Remainder of the study suggest developing small vessel disease of the foot. These results were called by telephone at the time of interpretation on 06/10/2015 at 4:41 pm to Dr. Josetta Huddle , who verbally acknowledged these results. Signed, Dulcy Fanny. Earleen Newport, DO [facility] Electronically Signed   By: Corrie Mckusick D.O.   On: 06/10/2015 16:41    Labs:  CBC: No results for input(s): WBC, HGB, HCT, PLT in the last 8760 hours.  COAGS: No results for input(s): INR, APTT in the last 8760 hours.  BMP: No results for input(s): NA, K, CL, CO2, GLUCOSE, BUN, CALCIUM, CREATININE, GFRNONAA, GFRAA in the last 8760 hours.  Invalid input(s): CMP  LIVER FUNCTION TESTS: No results for input(s): BILITOT, AST, ALT, ALKPHOS, PROT, ALBUMIN in the last 8760 hours.  TUMOR MARKERS: No results for input(s): AFPTM, CEA, CA199, CHROMGRNA in the last 8760 hours.  Assessment and Plan:  Mr. Goldsworthy is a 74 year old gentleman with lifestyle limiting [place] 3 class symptoms of peripheral arterial disease. Noninvasive arterial examination suggests a right superficial femoral artery high-grade stenosis or occlusion.  I had a lengthy discussion with Mr. Discher regarding peripheral arterial disease, anatomy, pathology/pathophysiology, and  natural history. I discussed medical options and more aggressive endovascular options for improving his symptoms. I did discuss with him available literature regarding success with supervised walking program and medical therapy.  I also discussed endovascular treatment, including risks and benefit of the procedure. Specific risks discussed include bleeding, infection, arterial injury, need for further surgery/procedure, contrast reaction, kidney injury, limb threat/loss, cardiopulmonary collapse, death.  After our discussion, Mr Vanwie would prefer to continue a walking program and medical therapy.  We will see him in 2 - 3 months, or at his convenience, if anything changes in the interval, or should he decide that he would prefer to try a more aggressive approach to his right sided claudication.    I would not repeat a non-invasive examination before 6 months unless any acute changes occur, and I see that he is currently on appropriate medical therapy with anti-hypertensive meds, cholesterol meds, and he is taking 81mg  per day of ASA.        Thank you for this interesting consult.  I greatly enjoyed  meeting Thedore Mins and look forward to participating in their care.    SignedCorrie Mckusick 06/23/2015, 10:04 AM   I spent a total of  40 Minutes   in face to face in clinical consultation, greater than 50% of which was counseling/coordinating care for lifestyle limiting claudication, possible endovascular repair.

## 2015-06-30 ENCOUNTER — Ambulatory Visit
Admission: RE | Admit: 2015-06-30 | Discharge: 2015-06-30 | Disposition: A | Discharge status: Home / Self Care | Payer: Medicare Other | Source: Ambulatory Visit | Attending: Interventional Radiology | Admitting: Interventional Radiology

## 2015-06-30 DIAGNOSIS — I2510 Atherosclerotic heart disease of native coronary artery without angina pectoris: Secondary | ICD-10-CM

## 2015-07-08 ENCOUNTER — Ambulatory Visit (INDEPENDENT_AMBULATORY_CARE_PROVIDER_SITE_OTHER): Payer: Medicare Other

## 2015-07-08 ENCOUNTER — Encounter: Payer: Medicare Other | Admitting: Physician Assistant

## 2015-07-08 ENCOUNTER — Other Ambulatory Visit: Payer: Self-pay | Admitting: Physician Assistant

## 2015-07-08 DIAGNOSIS — I2510 Atherosclerotic heart disease of native coronary artery without angina pectoris: Secondary | ICD-10-CM | POA: Diagnosis not present

## 2015-07-08 DIAGNOSIS — R9439 Abnormal result of other cardiovascular function study: Secondary | ICD-10-CM

## 2015-07-08 LAB — EXERCISE TOLERANCE TEST
MPHR: 148 {beats}/min
Rest HR: 68 {beats}/min

## 2015-07-09 ENCOUNTER — Telehealth (HOSPITAL_COMMUNITY): Payer: Self-pay | Admitting: *Deleted

## 2015-07-09 NOTE — Telephone Encounter (Signed)
Patient given detailed instructions per Myocardial Perfusion Study Information Sheet for the test on 07/13/15 at 1000. Patient notified to arrive 15 minutes early and that it is imperative to arrive on time for appointment to keep from having the test rescheduled.  If you need to cancel or reschedule your appointment, please call the office within 24 hours of your appointment. Failure to do so may result in a cancellation of your appointment, and a $50 no show fee. Patient verbalized understanding.Hubbard Robinson, RN

## 2015-07-13 ENCOUNTER — Ambulatory Visit (HOSPITAL_COMMUNITY): Payer: Medicare Other | Attending: Cardiology

## 2015-07-13 DIAGNOSIS — R9439 Abnormal result of other cardiovascular function study: Secondary | ICD-10-CM | POA: Diagnosis not present

## 2015-07-13 DIAGNOSIS — I2510 Atherosclerotic heart disease of native coronary artery without angina pectoris: Secondary | ICD-10-CM | POA: Diagnosis not present

## 2015-07-13 DIAGNOSIS — I739 Peripheral vascular disease, unspecified: Secondary | ICD-10-CM | POA: Diagnosis not present

## 2015-07-13 DIAGNOSIS — I10 Essential (primary) hypertension: Secondary | ICD-10-CM | POA: Insufficient documentation

## 2015-07-13 LAB — MYOCARDIAL PERFUSION IMAGING
CHL CUP NUCLEAR SRS: 18
CHL CUP NUCLEAR SSS: 24
LHR: 0.39
LV dias vol: 148 mL
LV sys vol: 78 mL
NUC STRESS TID: 1.04
Peak HR: 85 {beats}/min
Rest HR: 63 {beats}/min
SDS: 6

## 2015-07-13 MED ORDER — TECHNETIUM TC 99M SESTAMIBI GENERIC - CARDIOLITE
31.1000 | Freq: Once | INTRAVENOUS | Status: AC | PRN
  Administered 2015-07-13: 31.1 via INTRAVENOUS

## 2015-07-13 MED ORDER — TECHNETIUM TC 99M SESTAMIBI GENERIC - CARDIOLITE
10.8000 | Freq: Once | INTRAVENOUS | Status: AC | PRN
  Administered 2015-07-13: 11 via INTRAVENOUS

## 2015-07-13 MED ORDER — REGADENOSON 0.4 MG/5ML IV SOLN
0.4000 mg | Freq: Once | INTRAVENOUS | Status: AC
  Administered 2015-07-13: 0.4 mg via INTRAVENOUS

## 2015-07-15 ENCOUNTER — Encounter: Payer: Self-pay | Admitting: Physician Assistant

## 2015-07-21 ENCOUNTER — Ambulatory Visit (INDEPENDENT_AMBULATORY_CARE_PROVIDER_SITE_OTHER): Payer: Medicare Other | Admitting: Physician Assistant

## 2015-07-21 ENCOUNTER — Encounter: Payer: Self-pay | Admitting: Physician Assistant

## 2015-07-21 ENCOUNTER — Telehealth: Payer: Self-pay | Admitting: *Deleted

## 2015-07-21 ENCOUNTER — Ambulatory Visit: Payer: Medicare Other | Admitting: Nurse Practitioner

## 2015-07-21 ENCOUNTER — Other Ambulatory Visit: Payer: Self-pay | Admitting: Physician Assistant

## 2015-07-21 VITALS — BP 150/92 | HR 62 | Ht 72.0 in | Wt 205.0 lb

## 2015-07-21 DIAGNOSIS — I739 Peripheral vascular disease, unspecified: Secondary | ICD-10-CM

## 2015-07-21 DIAGNOSIS — R9439 Abnormal result of other cardiovascular function study: Secondary | ICD-10-CM

## 2015-07-21 LAB — PROTIME-INR
INR: 1 (ref <1.50)
Prothrombin Time: 13.3 seconds (ref 11.6–15.2)

## 2015-07-21 LAB — BASIC METABOLIC PANEL
BUN: 16 mg/dL (ref 7–25)
CALCIUM: 9.6 mg/dL (ref 8.6–10.3)
CO2: 27 mmol/L (ref 20–31)
CREATININE: 1.23 mg/dL — AB (ref 0.70–1.18)
Chloride: 102 mmol/L (ref 98–110)
Glucose, Bld: 118 mg/dL — ABNORMAL HIGH (ref 65–99)
POTASSIUM: 4.1 mmol/L (ref 3.5–5.3)
Sodium: 140 mmol/L (ref 135–146)

## 2015-07-21 LAB — CBC WITH DIFFERENTIAL/PLATELET
BASOS ABS: 0 10*3/uL (ref 0.0–0.1)
BASOS PCT: 0 % (ref 0–1)
EOS ABS: 0.1 10*3/uL (ref 0.0–0.7)
EOS PCT: 1 % (ref 0–5)
HCT: 47.9 % (ref 39.0–52.0)
Hemoglobin: 17 g/dL (ref 13.0–17.0)
Lymphocytes Relative: 12 % (ref 12–46)
Lymphs Abs: 1.2 10*3/uL (ref 0.7–4.0)
MCH: 32.4 pg (ref 26.0–34.0)
MCHC: 35.5 g/dL (ref 30.0–36.0)
MCV: 91.4 fL (ref 78.0–100.0)
MPV: 12.2 fL (ref 8.6–12.4)
Monocytes Absolute: 0.5 10*3/uL (ref 0.1–1.0)
Monocytes Relative: 5 % (ref 3–12)
Neutro Abs: 8 10*3/uL — ABNORMAL HIGH (ref 1.7–7.7)
Neutrophils Relative %: 82 % — ABNORMAL HIGH (ref 43–77)
PLATELETS: 132 10*3/uL — AB (ref 150–400)
RBC: 5.24 MIL/uL (ref 4.22–5.81)
RDW: 12.9 % (ref 11.5–15.5)
WBC: 9.7 10*3/uL (ref 4.0–10.5)

## 2015-07-21 LAB — APTT: APTT: 31 s (ref 24–37)

## 2015-07-21 NOTE — Progress Notes (Signed)
Cardiology Office Note   Date:  07/21/2015   ID:  Ronald Duran, DOB 1940-12-04, MRN [id number]  PCP:  Ronald Screws, MD  Cardiologist:  Dr. Tamala Duran  Chief Complaint  Patient presents with  . Claudication  . Follow-up    Dr. Tamala Duran, for abnormal stress test      History of Present Illness: Ronald Duran is a 74 y.o. male with PMH of CAD s/p MI with PCI to LAD in 07/1998, h/o ICM with EF 40% by echo 2005, HTN, HLD and prediabetes who presents today for evaluation of abnormal stress test. Patient was last seen by Dr. Tamala Duran on 06/12/2014 at which time he was doing well from cardiac perspective. He recently underwent ETT on 07/08/2015 during which test borderline ST depression was noted in inferolateral leads. He subsequently underwent myoview which showed EF 45%, large severe apical anterior, anteroseptal and inferoseptal infarct with peri-infarct ischemia, overall intermediate risk study. Patient presents today for cardiology visit and discussion regarding further treatment options.    Since his last visit, he did have carotid doppler and LE arterial doppler in Nov which shows no carotid blockage, but does not monophasic waveforms in RLE. His RLE ABI is only 0.8, but study shows potential R SFA or popliteal blockage. He has seen his radiologist Dr. Earleen Duran who recommended walking. He says the event leading up to LE arterial doppler was he noticed R leg pain with golf on 9/20. Since then he noticed RLE pain if he walk >3/4 of a mile. He denies any recent chest pain even while he was doing ETT. He obtained most of the labs in June at [facility]. His A1C was 5.1. His Lipid Panel shows Chol 130, HDL 44, Trig 86, LDL 69. Cr was 1.1. Hgb 17.3. Plat 118. TSH 1.65.     ETT 07/08/2015 1. ST segment depression was noted during stress.   Good exercise capacity. No chest pain. He did half L calf claudication (known PAD). Normal BP response to exercise.  There was borderline ST depression  in the inferolateral leads (1 mm).  Will arrange [employer].   Myoview 07/13/2015 2. Nuclear stress EF: 47%. 3. There was no ST segment deviation noted during stress. 4. Findings consistent with prior myocardial infarction with peri-infarct ischemia. 5. This is an intermediate risk study. 6. The left ventricular ejection fraction is mildly decreased (45-54%).  1. There was a large, severe mid to apical anterior, anteroseptal, and inferoseptal perfusion defect. This defect was primarily fixed with some reversibility, suggesting infarction with peri-infarct ischemia.  2. EF 47% with peri-apical hypokinesis.  3. Intermediate risk study (primarily infarction).     Past Medical History  Diagnosis Date  . Erectile dysfunction   . Hyperlipidemia   . HTN (hypertension)   . Ringworm   . Vitamin D deficiency   . Pre-diabetes   . CAD (coronary artery disease)     status post MI with LAD stent placement December, 1999 -last stress test November 2008 with no EKG evidence of ischemia   . Prostate cancer [facility])     with seed implant therapy following Lupron therapy in 2008 and 2009 - followed by Dr. Risa Duran  . Hiatal hernia     with esophageal strictures and dilatations in the past - Dr. Delfin Duran  . Dyslipidemia   . Cardiomyopathy ([place])     Ischemic cardiomyopathy with EF 40% by echo 2005  . History of nuclear stress test     Myoview 12/16: EF  47%, anterior, anteroseptal and inferoseptal defect suggesting infarct with peri-infarct ischemia, intermediate risk    Past Surgical History  Procedure Laterality Date  . Lad stent placement - dr. Daneen Schick    . Esophageal strictures with dilatations in the past per dr. Delfin Duran    . Prostatic radioactive seed implantation - dr. Valere Duran    . Left inguinal hernia repair - dr. Magdalene Duran       Current Outpatient Prescriptions  Medication Sig Dispense Refill  . aspirin 81 MG tablet Take 81 mg by mouth daily.    . B Complex  Vitamins (B COMPLEX 100) tablet Take 1 tablet by mouth daily.    . benazepril-hydrochlorthiazide (LOTENSIN HCT) 20-12.5 MG per tablet Take 1 tablet by mouth daily. 90 tablet 3  . cetirizine (ZYRTEC) 10 MG tablet Take 10 mg by mouth daily.    . Cholecalciferol (VITAMIN D3) 2000 UNITS TABS Take by mouth.    . Coenzyme Q10 (CO Q-10) 100 MG CAPS Take by mouth.    . fish oil-omega-3 fatty acids 1000 MG capsule Take by mouth daily. 28000 iu's    . metoprolol (LOPRESSOR) 50 MG tablet 25 mg daily. in the morning and 50 mg in the evening    . Multiple Vitamins-Minerals (CENTRUM) tablet Take 1 tablet by mouth daily.    . niacin (NIASPAN) 500 MG CR tablet 1,000 mg Nightly.     Marland Kitchen NITROSTAT 0.4 MG SL tablet place 1 tablet under the tongue if needed every 5 minutes for chest pain for 3 doses IF NO RELIEF AFTER 3RD DOSE CALL PRESCRIBER OR 911. 25 tablet 5  . omeprazole (PRILOSEC) 20 MG capsule 20 mg daily.     . simvastatin (ZOCOR) 80 MG tablet 40 mg daily.      No current facility-administered medications for this visit.    Allergies:   Review of patient's allergies indicates no known allergies.    Social History:  The patient  reports that he has never smoked. He has never used smokeless tobacco. He reports that he drinks alcohol.   Family History:  The patient's family history includes CAD in an other family member.    ROS:  Please see the history of present illness.   Otherwise, review of systems are positive for claudication.   All other systems are reviewed and negative.    PHYSICAL EXAM: VS:  BP 150/92 mmHg  Pulse 62  Ht 6' (1.829 m)  Wt 205 lb (92.987 kg)  BMI 27.80 kg/m2  SpO2 95% , BMI Body mass index is 27.8 kg/(m^2). GEN: Well nourished, well developed, in no acute distress HEENT: normal Neck: no JVD, carotid bruits, or masses Cardiac: RRR; no murmurs, rubs, or gallops,no edema  Respiratory:  clear to auscultation bilaterally, normal work of breathing GI: soft, nontender,  nondistended, + BS MS: no deformity or atrophy Skin: warm and dry, no rash Neuro:  Strength and sensation are intact Psych: euthymic mood, full affect   EKG:  EKG is not ordered today.   Recent Labs: No results found for requested labs within last 365 days.    Lipid Panel No results found for: CHOL, TRIG, HDL, CHOLHDL, VLDL, LDLCALC, LDLDIRECT    Wt Readings from Last 3 Encounters:  07/21/15 205 lb (92.987 kg)  07/13/15 211 lb (95.709 kg)  06/23/15 203 lb (92.08 kg)      Other studies Reviewed: Additional studies/ records that were reviewed today include: recent LE arterial doppler, previous office note, recent labs from  PCP. Recent carotid doopler Review of the above records demonstrates: newly diagnosed RLE PAD, also has abnormal stress test   ASSESSMENT AND PLAN:  1. Abnormal stress test  - abnormal ETT on 07/08/2015, myoview on 07/13/2015 showed EF 47%, intermediate risk study, large severe mid to apical anterior, anteroseptal and inferoseptal perfusion defect with peri-infarct ischemia  - patient seen with Dr. Tamala Duran, who recommended definitive assessment via cardiac cath. Will schedule for this Demetrius Charity, lab work prior to cath to check renal function and platelet level  -Risk and benefit of procedure explained to the patient who display clear understanding and agree to proceed. Discussed with patient possible procedural risk include bleeding, vascular injury, renal injury, arrythmia, MI, stroke and loss of limb or life.  2. R LE PAD: noticed in Sep 2016 while playing golf, has RLE pain with ambulating >3/4 mile. Discussed with Dr. Tamala Duran, will refer to Dr. Fletcher Anon in Jan or Feb.   3. 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 h/o ICM with EF 40% by echo 2005: EF 47% by myoview  5. HTN: BP in office was 150. However BP was home was 125, will hold off on adding new BP med  6. HLD: currently on fish oil, niacin, and zocor  7. Thrombocytopenia: platelet  118 on 02/04/2015   Current medicines are reviewed at length with the patient today.  The patient does not have concerns regarding medicines.  The following changes have been made:  no change  Labs/ tests ordered today include:   Orders Placed This Encounter  Procedures  . Basic Metabolic Panel (BMET)  . CBC w/Diff  . INR/PT  . PTT     Disposition:   FU with cardiology in 2 week, refer to Dr. Fletcher Anon in Jan or Feb for evaluation of RLE PAD  Weston Brass Almyra Deforest [facility] 07/21/2015 9:08 AM    [address], [place], [place]  [address] Phone: [phone]; Fax: [phone]

## 2015-07-21 NOTE — Telephone Encounter (Signed)
Pt called in to ask if pt needed to take ( 81 mg ) of ASA day of procedure.  Stated yes per Truitt Merle, NP and Almyra Deforest, PA-C

## 2015-07-21 NOTE — Patient Instructions (Addendum)
Medication Instructions:  Your physician recommends that you continue on your current medications as directed. Please refer to the Current Medication list given to you today.   Labwork: Your physician recommends that you have  lab work today: bmet/ptt/cbc/pt/inr   Testing/Procedures: -None  Follow-Up: Your physician recommends that you keep your scheduled  follow-up appointment with Almyra Deforest, PA-C/Brittiany Ladoris Gene Your physician recommends that you keep your scheduled  follow-up appointment with Dr. Fletcher Anon   Any Other Special Instructions Will Be Listed Below (If Applicable).  You are scheduled for a cardiac catheterization on Thursday, December 15 with Dr. Tamala Julian or associate.  Go to [facility] 2nd Floor Short Stay on Thursday, December 15 at 5:30 am.  Enter thru the [address] entrance A No food or drink after midnight on Wednesday, December 14. You may take your medications with a sip of water on the day of your procedure.     If you need a refill on your cardiac medications before your next appointment, please call your pharmacy.

## 2015-07-23 ENCOUNTER — Ambulatory Visit (HOSPITAL_COMMUNITY)
Admission: RE | Admit: 2015-07-23 | Discharge: 2015-07-23 | Disposition: A | Discharge status: Home / Self Care | Payer: Medicare Other | Source: Ambulatory Visit | Attending: Interventional Cardiology | Admitting: Interventional Cardiology

## 2015-07-23 ENCOUNTER — Telehealth: Payer: Self-pay | Admitting: *Deleted

## 2015-07-23 ENCOUNTER — Encounter (HOSPITAL_COMMUNITY)
Admission: RE | Disposition: A | Discharge status: Home / Self Care | Payer: Self-pay | Source: Ambulatory Visit | Attending: Interventional Cardiology

## 2015-07-23 DIAGNOSIS — Z955 Presence of coronary angioplasty implant and graft: Secondary | ICD-10-CM | POA: Insufficient documentation

## 2015-07-23 DIAGNOSIS — Z7982 Long term (current) use of aspirin: Secondary | ICD-10-CM | POA: Diagnosis not present

## 2015-07-23 DIAGNOSIS — R9439 Abnormal result of other cardiovascular function study: Secondary | ICD-10-CM | POA: Diagnosis present

## 2015-07-23 DIAGNOSIS — Z79899 Other long term (current) drug therapy: Secondary | ICD-10-CM | POA: Insufficient documentation

## 2015-07-23 DIAGNOSIS — R7303 Prediabetes: Secondary | ICD-10-CM | POA: Diagnosis not present

## 2015-07-23 DIAGNOSIS — Z8249 Family history of ischemic heart disease and other diseases of the circulatory system: Secondary | ICD-10-CM | POA: Insufficient documentation

## 2015-07-23 DIAGNOSIS — E785 Hyperlipidemia, unspecified: Secondary | ICD-10-CM | POA: Diagnosis not present

## 2015-07-23 DIAGNOSIS — I2582 Chronic total occlusion of coronary artery: Secondary | ICD-10-CM | POA: Diagnosis not present

## 2015-07-23 DIAGNOSIS — I10 Essential (primary) hypertension: Secondary | ICD-10-CM | POA: Diagnosis not present

## 2015-07-23 DIAGNOSIS — I255 Ischemic cardiomyopathy: Secondary | ICD-10-CM | POA: Insufficient documentation

## 2015-07-23 DIAGNOSIS — D696 Thrombocytopenia, unspecified: Secondary | ICD-10-CM | POA: Insufficient documentation

## 2015-07-23 DIAGNOSIS — I2510 Atherosclerotic heart disease of native coronary artery without angina pectoris: Secondary | ICD-10-CM | POA: Diagnosis not present

## 2015-07-23 DIAGNOSIS — I2584 Coronary atherosclerosis due to calcified coronary lesion: Secondary | ICD-10-CM | POA: Diagnosis not present

## 2015-07-23 DIAGNOSIS — Z8546 Personal history of malignant neoplasm of prostate: Secondary | ICD-10-CM | POA: Insufficient documentation

## 2015-07-23 DIAGNOSIS — I252 Old myocardial infarction: Secondary | ICD-10-CM | POA: Diagnosis not present

## 2015-07-23 DIAGNOSIS — I5022 Chronic systolic (congestive) heart failure: Secondary | ICD-10-CM | POA: Diagnosis present

## 2015-07-23 HISTORY — PX: CARDIAC CATHETERIZATION: SHX172

## 2015-07-23 SURGERY — LEFT HEART CATH AND CORONARY ANGIOGRAPHY

## 2015-07-23 MED ORDER — HEPARIN SODIUM (PORCINE) 1000 UNIT/ML IJ SOLN
INTRAMUSCULAR | Status: DC | PRN
  Administered 2015-07-23: 4500 [IU] via INTRAVENOUS

## 2015-07-23 MED ORDER — SODIUM CHLORIDE 0.9 % IV SOLN
250.0000 mL | INTRAVENOUS | Status: DC | PRN
Start: 2015-07-23 — End: 2015-07-23

## 2015-07-23 MED ORDER — LIDOCAINE HCL (PF) 1 % IJ SOLN
INTRAMUSCULAR | Status: DC | PRN
  Administered 2015-07-23: 2 mL

## 2015-07-23 MED ORDER — FENTANYL CITRATE (PF) 100 MCG/2ML IJ SOLN
INTRAMUSCULAR | Status: DC | PRN
  Administered 2015-07-23: 50 ug via INTRAVENOUS

## 2015-07-23 MED ORDER — HEPARIN (PORCINE) IN NACL 2-0.9 UNIT/ML-% IJ SOLN
INTRAMUSCULAR | Status: AC
  Filled 2015-07-23: qty 1500

## 2015-07-23 MED ORDER — ONDANSETRON HCL 4 MG/2ML IJ SOLN: 4.0000 mg | Freq: Four times a day (QID) | INTRAMUSCULAR | Status: DC | PRN

## 2015-07-23 MED ORDER — SODIUM CHLORIDE 0.9 % IJ SOLN: 3.0000 mL | Freq: Two times a day (BID) | INTRAMUSCULAR | Status: DC

## 2015-07-23 MED ORDER — FENTANYL CITRATE (PF) 100 MCG/2ML IJ SOLN
INTRAMUSCULAR | Status: AC
  Filled 2015-07-23: qty 2

## 2015-07-23 MED ORDER — SODIUM CHLORIDE 0.9 % WEIGHT BASED INFUSION
3.0000 mL/kg/h | INTRAVENOUS | Status: DC
  Administered 2015-07-23: 3 mL/kg/h via INTRAVENOUS

## 2015-07-23 MED ORDER — OXYCODONE-ACETAMINOPHEN 5-325 MG PO TABS: 1.0000 | ORAL_TABLET | ORAL | Status: DC | PRN

## 2015-07-23 MED ORDER — HEPARIN SODIUM (PORCINE) 1000 UNIT/ML IJ SOLN
INTRAMUSCULAR | Status: AC
  Filled 2015-07-23: qty 1

## 2015-07-23 MED ORDER — SODIUM CHLORIDE 0.9 % WEIGHT BASED INFUSION: 1.0000 mL/kg/h | INTRAVENOUS | Status: DC

## 2015-07-23 MED ORDER — MIDAZOLAM HCL 2 MG/2ML IJ SOLN
INTRAMUSCULAR | Status: DC | PRN
  Administered 2015-07-23: 1 mg via INTRAVENOUS

## 2015-07-23 MED ORDER — SODIUM CHLORIDE 0.9 % IJ SOLN: 3.0000 mL | INTRAMUSCULAR | Status: DC | PRN

## 2015-07-23 MED ORDER — NITROGLYCERIN 1 MG/10 ML FOR IR/CATH LAB
INTRA_ARTERIAL | Status: DC | PRN
  Administered 2015-07-23: 200 ug via INTRACORONARY

## 2015-07-23 MED ORDER — NITROGLYCERIN 1 MG/10 ML FOR IR/CATH LAB
INTRA_ARTERIAL | Status: AC
  Filled 2015-07-23: qty 10

## 2015-07-23 MED ORDER — VERAPAMIL HCL 2.5 MG/ML IV SOLN
INTRAVENOUS | Status: DC | PRN
  Administered 2015-07-23: 10 mL via INTRA_ARTERIAL

## 2015-07-23 MED ORDER — MIDAZOLAM HCL 2 MG/2ML IJ SOLN
INTRAMUSCULAR | Status: AC
  Filled 2015-07-23: qty 2

## 2015-07-23 MED ORDER — ACETAMINOPHEN 325 MG PO TABS: 650.0000 mg | ORAL_TABLET | ORAL | Status: DC | PRN

## 2015-07-23 MED ORDER — SODIUM CHLORIDE 0.9 % WEIGHT BASED INFUSION
1.0000 mL/kg/h | INTRAVENOUS | Status: DC
  Administered 2015-07-23: 1 mL/kg/h via INTRAVENOUS

## 2015-07-23 MED ORDER — IOHEXOL 350 MG/ML SOLN
INTRAVENOUS | Status: DC | PRN
  Administered 2015-07-23: 100 mL via INTRAVENOUS
  Administered 2015-07-23: 50 mL via INTRAVENOUS

## 2015-07-23 MED ORDER — SODIUM CHLORIDE 0.9 % IV SOLN: 250.0000 mL | INTRAVENOUS | Status: DC | PRN

## 2015-07-23 MED ORDER — LIDOCAINE HCL (PF) 1 % IJ SOLN
INTRAMUSCULAR | Status: AC
  Filled 2015-07-23: qty 30

## 2015-07-23 MED ORDER — NITROGLYCERIN 1 MG/10 ML FOR IR/CATH LAB
INTRA_ARTERIAL | Status: DC | PRN
  Administered 2015-07-23: 08:00:00

## 2015-07-23 MED ORDER — ASPIRIN 81 MG PO CHEW
81.0000 mg | CHEWABLE_TABLET | ORAL | Status: AC
  Administered 2015-07-23: 81 mg via ORAL

## 2015-07-23 MED ORDER — VERAPAMIL HCL 2.5 MG/ML IV SOLN
INTRAVENOUS | Status: AC
  Filled 2015-07-23: qty 2

## 2015-07-23 MED ORDER — ASPIRIN 81 MG PO CHEW
CHEWABLE_TABLET | ORAL | Status: AC
  Administered 2015-07-23: 81 mg via ORAL
  Filled 2015-07-23: qty 1

## 2015-07-23 MED ORDER — SODIUM CHLORIDE 0.9 % IJ SOLN
3.0000 mL | INTRAMUSCULAR | Status: DC | PRN
Start: 2015-07-23 — End: 2015-07-23

## 2015-07-23 SURGICAL SUPPLY — 9 items
CATH INFINITI 5 FR JL3.5 (CATHETERS) ×3 IMPLANT
CATH INFINITI JR4 5F (CATHETERS) ×3 IMPLANT
DEVICE RAD COMP TR BAND LRG (VASCULAR PRODUCTS) ×3 IMPLANT
GLIDESHEATH SLEND A-KIT 6F 22G (SHEATH) ×3 IMPLANT
KIT HEART LEFT (KITS) ×3 IMPLANT
PACK CARDIAC CATHETERIZATION (CUSTOM PROCEDURE TRAY) ×3 IMPLANT
TRANSDUCER W/STOPCOCK (MISCELLANEOUS) ×3 IMPLANT
TUBING CIL FLEX 10 FLL-RA (TUBING) ×3 IMPLANT
WIRE SAFE-T 1.5MM-J .035X260CM (WIRE) ×3 IMPLANT

## 2015-07-23 NOTE — Telephone Encounter (Signed)
-----   Message from [place], [place] sent at 07/23/2015  1:07 PM EST ----- precath labs stable, cath report reviewed, followup on 12/28 as previously scheduled

## 2015-07-23 NOTE — H&P (View-Only) (Signed)
Cardiology Office Note   Date:  07/21/2015   ID:  Ronald Duran, DOB 11/04/1940, MRN [id number]  PCP:  Ronald Screws, MD  Cardiologist:  Dr. Tamala Duran  Chief Complaint  Patient presents with  . Claudication  . Follow-up    Dr. Tamala Duran, for abnormal stress test      History of Present Illness: Ronald Duran is a 74 y.o. male with PMH of CAD s/p MI with PCI to LAD in 07/1998, h/o ICM with EF 40% by echo 2005, HTN, HLD and prediabetes who presents today for evaluation of abnormal stress test. Patient was last seen by Dr. Tamala Duran on 06/12/2014 at which time he was doing well from cardiac perspective. He recently underwent ETT on 07/08/2015 during which test borderline ST depression was noted in inferolateral leads. He subsequently underwent myoview which showed EF 45%, large severe apical anterior, anteroseptal and inferoseptal infarct with peri-infarct ischemia, overall intermediate risk study. Patient presents today for cardiology visit and discussion regarding further treatment options.    Since his last visit, he did have carotid doppler and LE arterial doppler in Nov which shows no carotid blockage, but does not monophasic waveforms in RLE. His RLE ABI is only 0.8, but study shows potential R SFA or popliteal blockage. He has seen his radiologist Dr. Earleen Duran who recommended walking. He says the event leading up to LE arterial doppler was he noticed R leg pain with golf on 9/20. Since then he noticed RLE pain if he walk >3/4 of a mile. He denies any recent chest pain even while he was doing ETT. He obtained most of the labs in June at [facility]. His A1C was 5.1. His Lipid Panel shows Chol 130, HDL 44, Trig 86, LDL 69. Cr was 1.1. Hgb 17.3. Plat 118. TSH 1.65.     ETT 07/08/2015 1. ST segment depression was noted during stress.   Good exercise capacity. No chest pain. He did half L calf claudication (known PAD). Normal BP response to exercise.  There was borderline ST depression  in the inferolateral leads (1 mm).  Will arrange [employer].   Myoview 07/13/2015 2. Nuclear stress EF: 47%. 3. There was no ST segment deviation noted during stress. 4. Findings consistent with prior myocardial infarction with peri-infarct ischemia. 5. This is an intermediate risk study. 6. The left ventricular ejection fraction is mildly decreased (45-54%).  1. There was a large, severe mid to apical anterior, anteroseptal, and inferoseptal perfusion defect. This defect was primarily fixed with some reversibility, suggesting infarction with peri-infarct ischemia.  2. EF 47% with peri-apical hypokinesis.  3. Intermediate risk study (primarily infarction).     Past Medical History  Diagnosis Date  . Erectile dysfunction   . Hyperlipidemia   . HTN (hypertension)   . Ringworm   . Vitamin D deficiency   . Pre-diabetes   . CAD (coronary artery disease)     status post MI with LAD stent placement December, 1999 -last stress test November 2008 with no EKG evidence of ischemia   . Prostate cancer [facility])     with seed implant therapy following Lupron therapy in 2008 and 2009 - followed by Ronald Duran  . Hiatal hernia     with esophageal strictures and dilatations in the past - Ronald Duran  . Dyslipidemia   . Cardiomyopathy ([place])     Ischemic cardiomyopathy with EF 40% by echo 2005  . History of nuclear stress test     Myoview 12/16: EF  47%, anterior, anteroseptal and inferoseptal defect suggesting infarct with peri-infarct ischemia, intermediate risk    Past Surgical History  Procedure Laterality Date  . Lad stent placement - dr. Daneen Schick    . Esophageal strictures with dilatations in the past per Ronald Duran    . Prostatic radioactive seed implantation - Ronald Duran    . Left inguinal hernia repair - Ronald Duran       Current Outpatient Prescriptions  Medication Sig Dispense Refill  . aspirin 81 MG tablet Take 81 mg by mouth daily.    . B Complex  Vitamins (B COMPLEX 100) tablet Take 1 tablet by mouth daily.    . benazepril-hydrochlorthiazide (LOTENSIN HCT) 20-12.5 MG per tablet Take 1 tablet by mouth daily. 90 tablet 3  . cetirizine (ZYRTEC) 10 MG tablet Take 10 mg by mouth daily.    . Cholecalciferol (VITAMIN D3) 2000 UNITS TABS Take by mouth.    . Coenzyme Q10 (CO Q-10) 100 MG CAPS Take by mouth.    . fish oil-omega-3 fatty acids 1000 MG capsule Take by mouth daily. 28000 iu's    . metoprolol (LOPRESSOR) 50 MG tablet 25 mg daily. in the morning and 50 mg in the evening    . Multiple Vitamins-Minerals (CENTRUM) tablet Take 1 tablet by mouth daily.    . niacin (NIASPAN) 500 MG CR tablet 1,000 mg Nightly.     Marland Kitchen NITROSTAT 0.4 MG SL tablet place 1 tablet under the tongue if needed every 5 minutes for chest pain for 3 doses IF NO RELIEF AFTER 3RD DOSE CALL PRESCRIBER OR 911. 25 tablet 5  . omeprazole (PRILOSEC) 20 MG capsule 20 mg daily.     . simvastatin (ZOCOR) 80 MG tablet 40 mg daily.      No current facility-administered medications for this visit.    Allergies:   Review of patient's allergies indicates no known allergies.    Social History:  The patient  reports that he has never smoked. He has never used smokeless tobacco. He reports that he drinks alcohol.   Family History:  The patient's family history includes CAD in an other family member.    ROS:  Please see the history of present illness.   Otherwise, review of systems are positive for claudication.   All other systems are reviewed and negative.    PHYSICAL EXAM: VS:  BP 150/92 mmHg  Pulse 62  Ht 6' (1.829 m)  Wt 205 lb (92.987 kg)  BMI 27.80 kg/m2  SpO2 95% , BMI Body mass index is 27.8 kg/(m^2). GEN: Well nourished, well developed, in no acute distress HEENT: normal Neck: no JVD, carotid bruits, or masses Cardiac: RRR; no murmurs, rubs, or gallops,no edema  Respiratory:  clear to auscultation bilaterally, normal work of breathing GI: soft, nontender,  nondistended, + BS MS: no deformity or atrophy Skin: warm and dry, no rash Neuro:  Strength and sensation are intact Psych: euthymic mood, full affect   EKG:  EKG is not ordered today.   Recent Labs: No results found for requested labs within last 365 days.    Lipid Panel No results found for: CHOL, TRIG, HDL, CHOLHDL, VLDL, LDLCALC, LDLDIRECT    Wt Readings from Last 3 Encounters:  07/21/15 205 lb (92.987 kg)  07/13/15 211 lb (95.709 kg)  06/23/15 203 lb (92.08 kg)      Other studies Reviewed: Additional studies/ records that were reviewed today include: recent LE arterial doppler, previous office note, recent labs from  PCP. Recent carotid doopler Review of the above records demonstrates: newly diagnosed RLE PAD, also has abnormal stress test   ASSESSMENT AND PLAN:  1. Abnormal stress test  - abnormal ETT on 07/08/2015, myoview on 07/13/2015 showed EF 47%, intermediate risk study, large severe mid to apical anterior, anteroseptal and inferoseptal perfusion defect with peri-infarct ischemia  - patient seen with Dr. Tamala Duran, who recommended definitive assessment via cardiac cath. Will schedule for this Demetrius Charity, lab work prior to cath to check renal function and platelet level  -Risk and benefit of procedure explained to the patient who display clear understanding and agree to proceed. Discussed with patient possible procedural risk include bleeding, vascular injury, renal injury, arrythmia, MI, stroke and loss of limb or life.  2. R LE PAD: noticed in Sep 2016 while playing golf, has RLE pain with ambulating >3/4 mile. Discussed with Dr. Tamala Duran, will refer to Dr. Fletcher Anon in Jan or Feb.   3. 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 h/o ICM with EF 40% by echo 2005: EF 47% by myoview  5. HTN: BP in office was 150. However BP was home was 125, will hold off on adding new BP med  6. HLD: currently on fish oil, niacin, and zocor  7. Thrombocytopenia: platelet  118 on 02/04/2015   Current medicines are reviewed at length with the patient today.  The patient does not have concerns regarding medicines.  The following changes have been made:  no change  Labs/ tests ordered today include:   Orders Placed This Encounter  Procedures  . Basic Metabolic Panel (BMET)  . CBC w/Diff  . INR/PT  . PTT     Disposition:   FU with cardiology in 2 week, refer to Dr. Fletcher Anon in Jan or Feb for evaluation of RLE PAD  Weston Brass Almyra Deforest [facility] 07/21/2015 9:08 AM    [address], [place], [place]  [address] Phone: [phone]; Fax: [phone]

## 2015-07-23 NOTE — Interval H&P Note (Signed)
Cath Lab Visit (complete for each Cath Lab visit)  Clinical Evaluation Leading to the Procedure:   ACS: No.  Non-ACS:    Anginal Classification: CCS I  Anti-ischemic medical therapy: Minimal Therapy (1 class of medications)  Non-Invasive Test Results: Intermediate-risk stress test findings: cardiac mortality 1-3%/year  Prior CABG: No previous CABG      History and Physical Interval Note:  07/23/2015 7:37 AM  Ronald Duran  has presented today for surgery, with the diagnosis of abnormal stress test  The various methods of treatment have been discussed with the patient and family. After consideration of risks, benefits and other options for treatment, the patient has consented to  Procedure(s): Left Heart Cath and Coronary Angiography (N/A) as a surgical intervention .  The patient's history has been reviewed, patient examined, no change in status, stable for surgery.  I have reviewed the patient's chart and labs.  Questions were answered to the patient's satisfaction.     Sinclair Grooms

## 2015-07-23 NOTE — Telephone Encounter (Signed)
Called pt to let him know his labs were stable and to keep his follow- appointment on 08/05/15.  Pt verbalized understanding.

## 2015-07-23 NOTE — Discharge Instructions (Signed)
Radial Site Care °Refer to this sheet in the next few weeks. These instructions provide you with information about caring for yourself after your procedure. Your health care provider may also give you more specific instructions. Your treatment has been planned according to current medical practices, but problems sometimes occur. Call your health care provider if you have any problems or questions after your procedure. °WHAT TO EXPECT AFTER THE PROCEDURE °After your procedure, it is typical to have the following: °· Bruising at the radial site that usually fades within 1-2 weeks. °· Blood collecting in the tissue (hematoma) that may be painful to the touch. It should usually decrease in size and tenderness within 1-2 weeks. °HOME CARE INSTRUCTIONS °· Take medicines only as directed by your health care provider. °· You may shower 24-48 hours after the procedure or as directed by your health care provider. Remove the bandage (dressing) and gently wash the site with plain soap and water. Pat the area dry with a clean towel. Do not rub the site, because this may cause bleeding. °· Do not take baths, swim, or use a hot tub until your health care provider approves. °· Check your insertion site every day for redness, swelling, or drainage. °· Do not apply powder or lotion to the site. °· Do not flex or bend the affected arm for 24 hours or as directed by your health care provider. °· Do not push or pull heavy objects with the affected arm for 24 hours or as directed by your health care provider. °· Do not lift over 10 lb (4.5 kg) for 5 days after your procedure or as directed by your health care provider. °· Ask your health care provider when it is okay to: °¨ Return to work or school. °¨ Resume usual physical activities or sports. °¨ Resume sexual activity. °· Do not drive home if you are discharged the same day as the procedure. Have someone else drive you. °· You may drive 24 hours after the procedure unless otherwise  instructed by your health care provider. °· Do not operate machinery or power tools for 24 hours after the procedure. °· If your procedure was done as an outpatient procedure, which means that you went home the same day as your procedure, a responsible adult should be with you for the first 24 hours after you arrive home. °· Keep all follow-up visits as directed by your health care provider. This is important. °SEEK MEDICAL CARE IF: °· You have a fever. °· You have chills. °· You have increased bleeding from the radial site. Hold pressure on the site. °SEEK IMMEDIATE MEDICAL CARE IF: °· You have unusual pain at the radial site. °· You have redness, warmth, or swelling at the radial site. °· You have drainage (other than a small amount of blood on the dressing) from the radial site. °· The radial site is bleeding, and the bleeding does not stop after 30 minutes of holding steady pressure on the site. °· Your arm or hand becomes pale, cool, tingly, or numb. °  °This information is not intended to replace advice given to you by your health care provider. Make sure you discuss any questions you have with your health care provider. °  °Document Released: 08/27/2010 Document Revised: 08/15/2014 Document Reviewed: 02/10/2014 °Elsevier Interactive Patient Education ©2016 Else[employer] Inc. ° °

## 2015-07-24 ENCOUNTER — Encounter (HOSPITAL_COMMUNITY): Payer: Self-pay | Admitting: Interventional Cardiology

## 2015-08-05 ENCOUNTER — Ambulatory Visit (INDEPENDENT_AMBULATORY_CARE_PROVIDER_SITE_OTHER): Payer: Medicare Other | Admitting: Physician Assistant

## 2015-08-05 ENCOUNTER — Encounter: Payer: Self-pay | Admitting: Physician Assistant

## 2015-08-05 VITALS — BP 151/90 | HR 57 | Resp 18 | Ht 72.0 in | Wt 205.0 lb

## 2015-08-05 DIAGNOSIS — I2510 Atherosclerotic heart disease of native coronary artery without angina pectoris: Secondary | ICD-10-CM

## 2015-08-05 DIAGNOSIS — I739 Peripheral vascular disease, unspecified: Secondary | ICD-10-CM

## 2015-08-05 NOTE — Patient Instructions (Signed)
Medication Instructions:  Your physician recommends that you continue on your current medications as directed. Please refer to the Current Medication list given to you today.  Lab work: NONE  Testing/Procedures: NONE  Follow-Up: Your physician wants you to follow-up in: 1 year with Dr. Tamala Julian. You will receive a reminder letter in the mail two months in advance. If you don't receive a letter, please call our office to schedule the follow-up appointment.   Your physician wants you to keep walking for exercise and to watch your cholesterol  through making healthy eating choices.  If you need a refill on your cardiac medications before your next appointment, please call your pharmacy.

## 2015-08-05 NOTE — Progress Notes (Signed)
Cardiology Office Note   Date:  08/05/2015   ID:  Ronald, Duran 1941/02/07, MRN [id number]  PCP:  Henrine Screws, MD  Cardiologist:  Dr. Suzzanne Cloud specialist: Dr. Fletcher Anon (1st app Jan 2017)  Chief Complaint  Patient presents with  . Follow-up    seen for Dr. Tamala Julian, post cath 07/23/2015  . Coronary Artery Disease  . PAD      History of Present Illness: Ronald Duran is a 74 y.o. male who presents for post cath followup. He has a PMH of CAD s/p MI with PCI to LAD in 07/1998, h/o ICM with EF 40% by echo 2005, HTN, HLD and prediabetes. Patient was last seen by Dr. Tamala Julian on 06/12/2014 at which time he was doing well from cardiac perspective. He recently underwent ETT on 07/08/2015 during which test borderline ST depression was noted in inferolateral leads. He subsequently underwent myoview which showed EF 45%, large severe apical anterior, anteroseptal and inferoseptal infarct with peri-infarct ischemia, overall intermediate risk study. He had carotid doppler and LE arterial doppler in Nov which shows no carotid blockage, but does not monophasic waveforms in RLE. His RLE ABI is only 0.8, but study shows potential R SFA or popliteal blockage. He has seen his radiologist Dr. Earleen Newport who recommended walking. He says the event leading up to LE arterial doppler was he noticed R leg pain with golf on 9/20. Since then he noticed RLE pain if he walk >3/4 of a mile.  He was seen by me on 07/21/2015, Dr. Tamala Julian recommended cardiac cath and referral to Dr. Fletcher Anon for evaluation and management of PAD. He underwent cardiac cath on 07/23/2015 which showed total occlusion of previous mid LAD stent with well developped apical collaterals from dominant RCA which likely explain his recent abnormal myoview. Given lack of symptom, medical therapy was recommended. He presents today for post cath followup. He is doing well. He says the original ETT this morning was for followup and he did not have any  chest pain that's reminiscent of prior angina. He did not have any chest pain during ETT. Post cath, he has started getting back to golfing. He went golfing yesterday.  He obtained most of the labs in June at [facility]. His A1C was 5.1. His Lipid Panel shows Chol 130, HDL 44, Trig 86, LDL 69. Cr was 1.1. Hgb 17.3. Plat 118. TSH 1.65.     Past Medical History  Diagnosis Date  . Erectile dysfunction   . Hyperlipidemia   . HTN (hypertension)   . Ringworm   . Vitamin D deficiency   . Pre-diabetes   . CAD (coronary artery disease)     a. s/p MI with LAD stent placement December, 1999 -last stress test November 2008 with no EKG evidence of ischemia  b. cath 07/23/2015 after abnormal ETT and nuc, occluded mid LAD stent with good distal collateral, medical therapy   . Prostate cancer [facility])     with seed implant therapy following Lupron therapy in 2008 and 2009 - followed by [name]  . Hiatal hernia     with esophageal strictures and dilatations in the past - Dr. Delfin Edis  . Dyslipidemia   . Cardiomyopathy ([place])     Ischemic cardiomyopathy with EF 40% by echo 2005  . History of nuclear stress test     Myoview 12/16: EF 47%, anterior, anteroseptal and inferoseptal defect suggesting infarct with peri-infarct ischemia, intermediate risk  . PAD (peripheral artery disease) ([place])  Past Surgical History  Procedure Laterality Date  . Lad stent placement - dr. Daneen Schick    . Esophageal strictures with dilatations in the past per dr. Delfin Edis    . Prostatic radioactive seed implantation - dr. Valere Dross    . Left inguinal hernia repair - dr. Magdalene River    . Cardiac catheterization N/A 07/23/2015    Procedure: Left Heart Cath and Coronary Angiography;  Surgeon: Belva Crome, MD;  Location: [facility];  Service: Cardiovascular;  Laterality: N/A;     Current Outpatient Prescriptions  Medication Sig Dispense Refill  . aspirin EC 81 MG tablet Take 81 mg by mouth daily.    . B  Complex Vitamins (B COMPLEX 100) tablet Take 1 tablet by mouth daily.    . benazepril-hydrochlorthiazide (LOTENSIN HCT) 20-12.5 MG per tablet Take 1 tablet by mouth daily. 90 tablet 3  . cetirizine (ZYRTEC) 10 MG tablet Take 10 mg by mouth daily.    . Cholecalciferol (VITAMIN D3) 2000 UNITS TABS Take 1 tablet by mouth daily.     . Coenzyme Q10 (CO Q-10) 100 MG CAPS Take by mouth.    . fish oil-omega-3 fatty acids 1000 MG capsule Take 2 g by mouth daily. 2400 iu's    . metoprolol (LOPRESSOR) 50 MG tablet Take 25 mg by mouth 2 (two) times daily. Take 1/2 tablet (25mg ) in the morning and 1 tablet (50mg ) in the evening    . Multiple Vitamins-Minerals (CENTRUM) tablet Take 1 tablet by mouth daily.    . niacin (NIASPAN) 500 MG CR tablet Take 1,000 mg by mouth at bedtime.     Marland Kitchen omeprazole (PRILOSEC) 20 MG capsule Take 20 mg by mouth daily.     . simvastatin (ZOCOR) 80 MG tablet Take 40 mg by mouth daily.     Marland Kitchen NITROSTAT 0.4 MG SL tablet place 1 tablet under the tongue if needed every 5 minutes for chest pain for 3 doses IF NO RELIEF AFTER 3RD DOSE CALL PRESCRIBER OR 911. (Patient not taking: Reported on 08/05/2015) 25 tablet 5   No current facility-administered medications for this visit.    Allergies:   Review of patient's allergies indicates no known allergies.    Social History:  The patient  reports that he has never smoked. He has never used smokeless tobacco. He reports that he drinks alcohol.   Family History:  The patient's family history includes CAD in his sister; CVA in his mother; CVA (age of onset: 30) in his father.    ROS:  Please see the history of present illness.   Otherwise, review of systems are positive for none.   All other systems are reviewed and negative.    PHYSICAL EXAM: VS:  BP 151/90 mmHg  Pulse 57  Resp 18  Ht 6' (1.829 m)  Wt 205 lb (92.987 kg)  BMI 27.80 kg/m2  SpO2 97% , BMI Body mass index is 27.8 kg/(m^2). GEN: Well nourished, well developed, in no acute  distress HEENT: normal Neck: no JVD, carotid bruits, or masses Cardiac: RRR; no murmurs, rubs, or gallops,no edema  Respiratory:  clear to auscultation bilaterally, normal work of breathing GI: soft, nontender, nondistended, + BS MS: no deformity or atrophy Skin: warm and dry, no rash Neuro:  Strength and sensation are intact Psych: euthymic mood, full affect   EKG:  EKG is not ordered today.   Recent Labs: 07/21/2015: BUN 16; Creat 1.23*; Hemoglobin 17.0; Platelets 132*; Potassium 4.1; Sodium 140  Lipid Panel No results found for: CHOL, TRIG, HDL, CHOLHDL, VLDL, LDLCALC, LDLDIRECT    Wt Readings from Last 3 Encounters:  08/05/15 205 lb (92.987 kg)  07/23/15 200 lb (90.719 kg)  07/21/15 205 lb (92.987 kg)      Other studies Reviewed: Additional studies/ records that were reviewed today include:   ETT 07/08/2015 1. ST segment depression was noted during stress.   Good exercise capacity. No chest pain. He did half L calf claudication (known PAD). Normal BP response to exercise.  There was borderline ST depression in the inferolateral leads (1 mm).  Will arrange [employer].   Myoview 07/13/2015  Nuclear stress EF: 47%.  There was no ST segment deviation noted during stress.  Findings consistent with prior myocardial infarction with peri-infarct ischemia.  This is an intermediate risk study.  The left ventricular ejection fraction is mildly decreased (45-54%).  1. There was a large, severe mid to apical anterior, anteroseptal, and inferoseptal perfusion defect. This defect was primarily fixed with some reversibility, suggesting infarction with peri-infarct ischemia.  2. EF 47% with peri-apical hypokinesis.  3. Intermediate risk study (primarily infarction).    Cardiac cath 07/23/2015 Conclusion    2. Mid LAD lesion, 99% stenosed. The lesion was previously treated with a stent (bare metal). LAD receives collaterals from the right  coronary 3. Mid LAD to Dist LAD lesion, 80% stenosed.   Total occlusion of the mid LAD after the first diagonal within the previously stented segment. Well-developed apical collaterals from the dominant right coronary.  Widely patent dominant RCA and circumflex coronary arteries.  Akinesis of the apical segment with intramyocardial calcification. EF 45-50%.  The anatomy as described above explains the abnormalities noted on the recent myocardial perfusion study. In the absence of symptoms, conservative medical management will continue.  Recommendations:   Continue aggressive risk factor modification  Continue walking program  Notify if any anginal symptoms.     Review of the above records demonstrates: Recent abnormal nuc, underwent cath, medical therapy. Has has new PAD, referred to Dr. Fletcher Anon in Jan 2017. No significant carotid artery disease   ASSESSMENT AND PLAN:  1. Abnormal stress test - abnormal ETT on 07/08/2015, myoview on 07/13/2015 showed EF 47%, intermediate risk study, large severe mid to apical anterior, anteroseptal and inferoseptal perfusion defect with peri-infarct ischemia - cath 07/23/2015 occluded mid LAD stent with good R to L collateral from dominant RCA. Medical therapy. Continue BB, statin, ASA, Benazepril/HCTZ. 1 year followup with Dr. Tamala Julian  2. R LE PAD: noticed in Sep 2016 while playing golf, has RLE pain with ambulating >3/4 mile. Referred to Dr. Fletcher Anon in Jan. Discussed with Dr. Tamala Julian during last visit on 07/21/2015   3. CAD s/p MI with PCI to LAD in 07/1998: stable, no angina  4. h/o ICM with EF 40% by echo 2005: EF 47% by myoview  5. HTN: BP in office was 150 by his machine, 135/90 by me. However BP was home was 125. Will not add Imdur at this time, may consider in the future.  6. HLD: currently on fish oil, niacin, and zocor. PCP check it every June, see last result in HPI  7. Thrombocytopenia: platelet 118 on  02/04/2015   Current medicines are reviewed at length with the patient today.  The patient does not have concerns regarding medicines.  The following changes have been made:  no change  Labs/ tests ordered today include:  No orders of the defined types were placed in this encounter.  Disposition:   FU with Dr. Tamala Julian in 1 year  Signed, [place] 08/05/2015 8:56 AM    [address], [place], [place]  [address] Phone: [phone]; Fax: [phone]

## 2015-09-07 ENCOUNTER — Other Ambulatory Visit: Payer: Self-pay | Admitting: Interventional Cardiology

## 2015-09-08 ENCOUNTER — Encounter: Payer: Self-pay | Admitting: Cardiovascular Disease

## 2015-09-08 ENCOUNTER — Ambulatory Visit (INDEPENDENT_AMBULATORY_CARE_PROVIDER_SITE_OTHER): Payer: Medicare Other | Admitting: Cardiovascular Disease

## 2015-09-08 VITALS — BP 126/66 | HR 72 | Ht 72.0 in | Wt 202.0 lb

## 2015-09-08 DIAGNOSIS — E785 Hyperlipidemia, unspecified: Secondary | ICD-10-CM

## 2015-09-08 DIAGNOSIS — I2510 Atherosclerotic heart disease of native coronary artery without angina pectoris: Secondary | ICD-10-CM | POA: Diagnosis not present

## 2015-09-08 DIAGNOSIS — J302 Other seasonal allergic rhinitis: Secondary | ICD-10-CM | POA: Insufficient documentation

## 2015-09-08 DIAGNOSIS — I739 Peripheral vascular disease, unspecified: Secondary | ICD-10-CM | POA: Diagnosis not present

## 2015-09-08 DIAGNOSIS — I10 Essential (primary) hypertension: Secondary | ICD-10-CM | POA: Insufficient documentation

## 2015-09-08 NOTE — Assessment & Plan Note (Signed)
Per guidelines, consider decreasing the dose of simvastatin to 40 mg once daily or switching to another potent statin such as atorvastatin or rosuvastatin.

## 2015-09-08 NOTE — Assessment & Plan Note (Signed)
He reports no anginal symptoms at the present time.

## 2015-09-08 NOTE — Patient Instructions (Signed)

## 2015-09-08 NOTE — Progress Notes (Signed)
Primary cardiologist: Dr. Tamala Julian. Primary care physician: Dr. Inda Merlin  HPI  This is a pleasant 75 year old male who was referred by Dr. Tamala Julian for evaluation and management of peripheral arterial disease. He has a PMH of CAD s/p MI with PCI to LAD in 07/1998, h/o ICM with EF 40% by echo 2005, HTN, HLD and prediabetes.  He recently underwent ETT on 07/08/2015 during which test borderline ST depression was noted in inferolateral leads. He underwent cardiac cath on 07/23/2015 which showed total occlusion of previous mid LAD stent with well developped apical collaterals from dominant RCA which likely explain his recent abnormal myoview.    He started having right calf pain in November which was mainly exertional. He underwent noninvasive vascular evaluation which showed an ABI of 0.8 on the right and 1.0 on the left. Waveforms suggested SFA or popliteal disease.  Since then, the patient started a walking program with gradual improvement in claudication. He doesn't have any rest pain or lower extremity ulceration. When he had his stress test on November 30, he was able to exercise for 5-1/2 minutes. He walked yesterday for 45 minutes with no significant claudication. He is not a smoker and has no diabetes. His father had a stroke at the age of 78 and uncles had myocardial infarction at the relatively young age.  No Known Allergies   Current Outpatient Prescriptions on File Prior to Visit  Medication Sig Dispense Refill  . aspirin EC 81 MG tablet Take 81 mg by mouth daily.    . B Complex Vitamins (B COMPLEX 100) tablet Take 1 tablet by mouth daily.    . benazepril-hydrochlorthiazide (LOTENSIN HCT) 20-12.5 MG tablet take 1 tablet by mouth once daily 90 tablet 3  . cetirizine (ZYRTEC) 10 MG tablet Take 10 mg by mouth daily.    . Cholecalciferol (VITAMIN D3) 2000 UNITS TABS Take 1 tablet by mouth daily.     . Coenzyme Q10 (CO Q-10) 100 MG CAPS Take by mouth.    . fish oil-omega-3 fatty acids 1000 MG  capsule Take 2 g by mouth daily. 2400 iu's    . metoprolol (LOPRESSOR) 50 MG tablet Take 25 mg by mouth 2 (two) times daily. Take 1/2 tablet (25mg ) in the morning and 1 tablet (50mg ) in the evening    . Multiple Vitamins-Minerals (CENTRUM) tablet Take 1 tablet by mouth daily.    . niacin (NIASPAN) 500 MG CR tablet Take 1,000 mg by mouth at bedtime.     Marland Kitchen NITROSTAT 0.4 MG SL tablet place 1 tablet under the tongue if needed every 5 minutes for chest pain for 3 doses IF NO RELIEF AFTER 3RD DOSE CALL PRESCRIBER OR 911. 25 tablet 5  . omeprazole (PRILOSEC) 20 MG capsule Take 20 mg by mouth daily.     . simvastatin (ZOCOR) 80 MG tablet Take 40 mg by mouth daily.      No current facility-administered medications on file prior to visit.     Past Medical History  Diagnosis Date  . Erectile dysfunction   . Hyperlipidemia   . HTN (hypertension)   . Ringworm   . Vitamin D deficiency   . Pre-diabetes   . CAD (coronary artery disease)     a. s/p MI with LAD stent placement December, 1999 -last stress test November 2008 with no EKG evidence of ischemia  b. cath 07/23/2015 after abnormal ETT and nuc, occluded mid LAD stent with good distal collateral, medical therapy   . Prostate cancer ([place])  with seed implant therapy following Lupron therapy in 2008 and 2009 - followed by [name]  . Hiatal hernia     with esophageal strictures and dilatations in the past - Dr. Delfin Edis  . Dyslipidemia   . Cardiomyopathy ([place])     Ischemic cardiomyopathy with EF 40% by echo 2005  . History of nuclear stress test     Myoview 12/16: EF 47%, anterior, anteroseptal and inferoseptal defect suggesting infarct with peri-infarct ischemia, intermediate risk  . PAD (peripheral artery disease) [facility])      Past Surgical History  Procedure Laterality Date  . Lad stent placement - dr. Daneen Schick    . Esophageal strictures with dilatations in the past per dr. Delfin Edis    . Prostatic radioactive seed implantation  - dr. Valere Dross    . Left inguinal hernia repair - dr. Magdalene River    . Cardiac catheterization N/A 07/23/2015    Procedure: Left Heart Cath and Coronary Angiography;  Surgeon: Belva Crome, MD;  Location: [place] [facility];  Service: Cardiovascular;  Laterality: N/A;     Family History  Problem Relation Age of Onset  . CAD Sister   . CVA Father 41  . CVA Mother      Social History   Social History  . Marital Status: Married    Spouse Name: N/A  . Number of Children: N/A  . Years of Education: N/A   Occupational History  . Not on file.   Social History Main Topics  . Smoking status: Never Smoker   . Smokeless tobacco: Never Used  . Alcohol Use: 0.0 oz/week    0 Standard drinks or equivalent per week     Comment: occasionally for special occasions    . Drug Use: Not on file  . Sexual Activity: Not on file   Other Topics Concern  . Not on file   Social History Narrative     ROS A 10 point review of system was performed. It is negative other than that mentioned in the history of present illness.   PHYSICAL EXAM   BP 126/66 mmHg  Pulse 72  Ht 6' (1.829 m)  Wt 202 lb (91.627 kg)  BMI 27.39 kg/m2 Constitutional: He is oriented to person, place, and time. He appears well-developed and well-nourished. No distress.  HENT: No nasal discharge.  Head: Normocephalic and atraumatic.  Eyes: Pupils are equal and round.  No discharge. Neck: Normal range of motion. Neck supple. No JVD present. No thyromegaly present.  Cardiovascular: Normal rate, regular rhythm, normal heart sounds. Exam reveals no gallop and no friction rub. No murmur heard.  Pulmonary/Chest: Effort normal and breath sounds normal. No stridor. No respiratory distress. He has no wheezes. He has no rales. He exhibits no tenderness.  Abdominal: Soft. Bowel sounds are normal. He exhibits no distension. There is no tenderness. There is no rebound and no guarding.  Musculoskeletal: Normal range of motion. He  exhibits no edema and no tenderness.  Neurological: He is alert and oriented to person, place, and time. Coordination normal.  Skin: Skin is warm and dry. No rash noted. He is not diaphoretic. No erythema. No pallor.  Psychiatric: He has a normal mood and affect. His behavior is normal. Judgment and thought content normal.  Vascular: Femoral pulses are normal bilaterally. Posterior tibial is absent on the right side and +1 on the left side. Dorsalis pedis is nonpalpable.      ASSESSMENT AND PLAN

## 2015-09-08 NOTE — Assessment & Plan Note (Signed)
The patient has mild right calf claudication which is not lifestyle limiting and has improved significantly with a walking program. His ABI was 0.8 with suggestion of SFA/popliteal disease. Given that his symptoms are mild, I recommend continuing medical therapy in an exercise program. There is currently no indication for revascularization. Given that he is not diabetic and is not a smoker, the chance of him progressing to critical limb  ischemia is very small.  He is currently on optimal medical therapy.  Follow-up with me on a yearly basis or earlier if symptoms worsen.

## 2015-09-15 ENCOUNTER — Telehealth: Payer: Self-pay | Admitting: *Deleted

## 2015-09-15 NOTE — Telephone Encounter (Signed)
I called Mr. Ronald Duran for a f/u with Dr. Earleen Newport and although he was pleased with his visit with Dr. Earleen Newport, Dr Tamala Julian reccommended he f/u with Dr. Fletcher Anon in the same practice. The patient is in agreement with Dr. Darliss Ridgel recommendation and will have all f/u with Dr. Fletcher Anon.

## 2015-10-14 ENCOUNTER — Encounter: Payer: Self-pay | Admitting: Gastroenterology

## 2015-10-15 ENCOUNTER — Other Ambulatory Visit: Payer: Self-pay | Admitting: Gastroenterology

## 2015-11-12 ENCOUNTER — Encounter (HOSPITAL_COMMUNITY): Payer: Self-pay | Admitting: *Deleted

## 2015-11-24 ENCOUNTER — Encounter (HOSPITAL_COMMUNITY): Payer: Self-pay | Admitting: *Deleted

## 2015-11-24 ENCOUNTER — Ambulatory Visit (HOSPITAL_COMMUNITY): Payer: Medicare Other | Admitting: Certified Registered Nurse Anesthetist

## 2015-11-24 ENCOUNTER — Ambulatory Visit (HOSPITAL_COMMUNITY)
Admission: RE | Admit: 2015-11-24 | Discharge: 2015-11-24 | Disposition: A | Discharge status: Home / Self Care | Payer: Medicare Other | Source: Ambulatory Visit | Attending: Gastroenterology | Admitting: Gastroenterology

## 2015-11-24 ENCOUNTER — Encounter (HOSPITAL_COMMUNITY)
Admission: RE | Disposition: A | Discharge status: Home / Self Care | Payer: Self-pay | Source: Ambulatory Visit | Attending: Gastroenterology

## 2015-11-24 DIAGNOSIS — I2510 Atherosclerotic heart disease of native coronary artery without angina pectoris: Secondary | ICD-10-CM | POA: Insufficient documentation

## 2015-11-24 DIAGNOSIS — I10 Essential (primary) hypertension: Secondary | ICD-10-CM | POA: Insufficient documentation

## 2015-11-24 DIAGNOSIS — E7800 Pure hypercholesterolemia, unspecified: Secondary | ICD-10-CM | POA: Insufficient documentation

## 2015-11-24 DIAGNOSIS — Z79899 Other long term (current) drug therapy: Secondary | ICD-10-CM | POA: Insufficient documentation

## 2015-11-24 DIAGNOSIS — Z86010 Personal history of colonic polyps: Secondary | ICD-10-CM | POA: Diagnosis not present

## 2015-11-24 DIAGNOSIS — I7300 Raynaud's syndrome without gangrene: Secondary | ICD-10-CM | POA: Diagnosis not present

## 2015-11-24 DIAGNOSIS — I252 Old myocardial infarction: Secondary | ICD-10-CM | POA: Diagnosis not present

## 2015-11-24 DIAGNOSIS — I739 Peripheral vascular disease, unspecified: Secondary | ICD-10-CM | POA: Diagnosis not present

## 2015-11-24 DIAGNOSIS — Z1211 Encounter for screening for malignant neoplasm of colon: Secondary | ICD-10-CM | POA: Diagnosis not present

## 2015-11-24 DIAGNOSIS — D125 Benign neoplasm of sigmoid colon: Secondary | ICD-10-CM | POA: Diagnosis not present

## 2015-11-24 DIAGNOSIS — Z955 Presence of coronary angioplasty implant and graft: Secondary | ICD-10-CM | POA: Insufficient documentation

## 2015-11-24 DIAGNOSIS — I255 Ischemic cardiomyopathy: Secondary | ICD-10-CM | POA: Insufficient documentation

## 2015-11-24 DIAGNOSIS — Z85820 Personal history of malignant melanoma of skin: Secondary | ICD-10-CM | POA: Diagnosis not present

## 2015-11-24 DIAGNOSIS — Z8546 Personal history of malignant neoplasm of prostate: Secondary | ICD-10-CM | POA: Diagnosis not present

## 2015-11-24 HISTORY — PX: COLONOSCOPY WITH PROPOFOL: SHX5780

## 2015-11-24 HISTORY — DX: Allergy status to unspecified drugs, medicaments and biological substances: Z88.9

## 2015-11-24 SURGERY — COLONOSCOPY WITH PROPOFOL
Anesthesia: Monitor Anesthesia Care

## 2015-11-24 MED ORDER — ONDANSETRON HCL 4 MG/2ML IJ SOLN
INTRAMUSCULAR | Status: AC
  Filled 2015-11-24: qty 2

## 2015-11-24 MED ORDER — PROPOFOL 10 MG/ML IV BOLUS
INTRAVENOUS | Status: AC
  Filled 2015-11-24: qty 40

## 2015-11-24 MED ORDER — SODIUM CHLORIDE 0.9 % IV SOLN
INTRAVENOUS | Status: DC
Start: 2015-11-24 — End: 2015-11-24

## 2015-11-24 MED ORDER — PROPOFOL 10 MG/ML IV BOLUS
INTRAVENOUS | Status: DC | PRN
  Administered 2015-11-24: 20 mg via INTRAVENOUS

## 2015-11-24 MED ORDER — ONDANSETRON HCL 4 MG/2ML IJ SOLN
INTRAMUSCULAR | Status: DC | PRN
  Administered 2015-11-24: 4 mg via INTRAVENOUS

## 2015-11-24 MED ORDER — LIDOCAINE HCL (CARDIAC) 20 MG/ML IV SOLN
INTRAVENOUS | Status: DC | PRN
  Administered 2015-11-24: 60 mg via INTRATRACHEAL

## 2015-11-24 MED ORDER — LACTATED RINGERS IV SOLN
INTRAVENOUS | Status: DC
  Administered 2015-11-24: 1000 mL via INTRAVENOUS

## 2015-11-24 MED ORDER — PROPOFOL 500 MG/50ML IV EMUL
INTRAVENOUS | Status: DC | PRN
  Administered 2015-11-24: 75 ug/kg/min via INTRAVENOUS

## 2015-11-24 MED ORDER — LIDOCAINE HCL (CARDIAC) 20 MG/ML IV SOLN
INTRAVENOUS | Status: AC
  Filled 2015-11-24: qty 5

## 2015-11-24 SURGICAL SUPPLY — 22 items

## 2015-11-24 NOTE — Anesthesia Procedure Notes (Signed)
Procedure Name: MAC Date/Time: 11/24/2015 8:11 AM Performed by: West Pugh Pre-anesthesia Checklist: Patient identified, Timeout performed, Emergency Drugs available, Suction available and Patient being monitored Patient Re-evaluated:Patient Re-evaluated prior to inductionOxygen Delivery Method: Simple face mask Dental Injury: Teeth and Oropharynx as per pre-operative assessment

## 2015-11-24 NOTE — Discharge Instructions (Signed)
Colonoscopy, Care After °Refer to this sheet in the next few weeks. These instructions provide you with information on caring for yourself after your procedure. Your health care provider may also give you more specific instructions. Your treatment has been planned according to current medical practices, but problems sometimes occur. Call your health care provider if you have any problems or questions after your procedure. °WHAT TO EXPECT AFTER THE PROCEDURE  °After your procedure, it is typical to have the following: °· A small amount of blood in your stool. °· Moderate amounts of gas and mild abdominal cramping or bloating. °HOME CARE INSTRUCTIONS °· Do not drive, operate machinery, or sign important documents for 24 hours. °· You may shower and resume your regular physical activities, but move at a slower pace for the first 24 hours. °· Take frequent rest periods for the first 24 hours. °· Walk around or put a warm pack on your abdomen to help reduce abdominal cramping and bloating. °· Drink enough fluids to keep your urine clear or pale yellow. °· You may resume your normal diet as instructed by your health care provider. Avoid heavy or fried foods that are hard to digest. °· Avoid drinking alcohol for 24 hours or as instructed by your health care provider. °· Only take over-the-counter or prescription medicines as directed by your health care provider. °· If a tissue sample (biopsy) was taken during your procedure: °¨ Do not take aspirin or blood thinners for 7 days, or as instructed by your health care provider. °¨ Do not drink alcohol for 7 days, or as instructed by your health care provider. °¨ Eat soft foods for the first 24 hours. °SEEK MEDICAL CARE IF: °You have persistent spotting of blood in your stool 2-3 days after the procedure. °SEEK IMMEDIATE MEDICAL CARE IF: °· You have more than a small spotting of blood in your stool. °· You pass large blood clots in your stool. °· Your abdomen is swollen  (distended). °· You have nausea or vomiting. °· You have a fever. °· You have increasing abdominal pain that is not relieved with medicine. °  °This information is not intended to replace advice given to you by your health care provider. Make sure you discuss any questions you have with your health care provider. °  °Document Released: 03/08/2004 Document Revised: 05/15/2013 Document Reviewed: 04/01/2013 °Elsevier Interactive Patient Education ©2016 [employer] ° °

## 2015-11-24 NOTE — Anesthesia Postprocedure Evaluation (Signed)
Anesthesia Post Note  Patient: Ronald Duran  Procedure(s) Performed: Procedure(s) (LRB): COLONOSCOPY WITH PROPOFOL (N/A)  Patient location during evaluation: PACU Anesthesia Type: MAC Level of consciousness: awake and alert Pain management: pain level controlled Vital Signs Assessment: post-procedure vital signs reviewed and stable Respiratory status: spontaneous breathing, nonlabored ventilation, respiratory function stable and patient connected to nasal cannula oxygen Cardiovascular status: stable and blood pressure returned to baseline Anesthetic complications: no    Last Vitals:  Filed Vitals:   11/24/15 0850 11/24/15 0900  BP: 122/54 140/59  Pulse: 44 48  Temp:    Resp: 12 11    Last Pain: There were no vitals filed for this visit.               Konstantine Gervasi J

## 2015-11-24 NOTE — Transfer of Care (Signed)
Immediate Anesthesia Transfer of Care Note  Patient: Ronald Duran  Procedure(s) Performed: Procedure(s): COLONOSCOPY WITH PROPOFOL (N/A)  Patient Location: PACU  Anesthesia Type:MAC  Level of Consciousness:  sedated, patient cooperative and responds to stimulation  Airway & Oxygen Therapy:Patient Spontanous Breathing and Patient connected to face mask oxgen  Post-op Assessment:  Report given to PACU RN and Post -op Vital signs reviewed and stable  Post vital signs:  Reviewed and stable  Last Vitals:  Filed Vitals:   11/24/15 0735  BP: 145/81  Pulse: 46  Temp: 36.7 C  Resp: 16    Complications: No apparent anesthesia complications

## 2015-11-24 NOTE — Op Note (Signed)
[facility] Patient Name: Ronald Duran Procedure Date: 11/24/2015 MRN: [id number] Attending MD: Garlan Fair , MD Date of Birth: 09/28/1940 CSN:  Age: 75 Admit Type: Outpatient Procedure:                Colonoscopy Indications:              High risk colon cancer surveillance: Personal                            history of adenoma less than 10 mm in size Providers:                Garlan Fair, MD, Carolynn Comment, RN, Elspeth Cho, Technician Referring MD:              Medicines:                Propofol per Anesthesia Complications:            No immediate complications. Estimated Blood Loss:     Estimated blood loss: none. Procedure:                Pre-Anesthesia Assessment:                           - Prior to the procedure, a History and Physical                            was performed, and patient medications and                            allergies were reviewed. The patient's tolerance of                            previous anesthesia was also reviewed. The risks                            and benefits of the procedure and the sedation                            options and risks were discussed with the patient.                            All questions were answered, and informed consent                            was obtained. Prior Anticoagulants: The patient has                            taken aspirin, last dose was day of procedure. ASA                            Grade Assessment: III - A patient with severe  systemic disease. After reviewing the risks and                            benefits, the patient was deemed in satisfactory                            condition to undergo the procedure.                           After obtaining informed consent, the colonoscope                            was passed under direct vision. Throughout the                            procedure, the  patient's blood pressure, pulse, and                            oxygen saturations were monitored continuously. The                            Colonoscope was introduced through the anus and                            advanced to the the cecum, identified by                            appendiceal orifice and ileocecal valve. The                            colonoscopy was performed without difficulty. The                            patient tolerated the procedure well. The quality                            of the bowel preparation was good. The ileocecal                            valve, the appendiceal orifice and the rectum were                            photographed. Scope In: 8:15:16 AM Scope Out: 8:32:03 AM Scope Withdrawal Time: 0 hours 8 minutes 13 seconds  Total Procedure Duration: 0 hours 16 minutes 47 seconds  Findings:      The perianal and digital rectal examinations were normal.      A 3 mm polyp was found in the distal sigmoid colon. The polyp was       sessile. The polyp was removed with a cold biopsy forceps. Resection and       retrieval were complete.      The exam was otherwise without abnormality. Impression:               - One 3 mm polyp in the distal sigmoid colon,  removed with a cold biopsy forceps. Resected and                            retrieved.                           - The examination was otherwise normal. Moderate Sedation:      N/A- Per Anesthesia Care Recommendation:           - Patient has a contact number available for                            emergencies. The signs and symptoms of potential                            delayed complications were discussed with the                            patient. Return to normal activities tomorrow.                            Written discharge instructions were provided to the                            patient.                           - Repeat colonoscopy is not recommended for                             surveillance.                           - Resume previous diet.                           - Continue present medications. Procedure Code(s):        --- Professional ---                           [phone], Colonoscopy, flexible; with biopsy, single                            or multiple Diagnosis Code(s):        --- Professional ---                           Z86.010, Personal history of colonic polyps                           D12.5, Benign neoplasm of sigmoid colon CPT copyright 2016 American Medical Association. All rights reserved. The codes documented in this report are preliminary and upon coder review may  be revised to meet current compliance requirements. Earle Gell, MD Garlan Fair, MD 11/24/2015 8:38:39 AM This report has been signed electronically. Number of Addenda: 0

## 2015-11-24 NOTE — H&P (Signed)
  Procedure: Surveillance colonoscopy. 10/14/2010 colonoscopy was performed with removal of a 4 mm adenomatous rectal polyp  History: The patient is a 75 year old male born in 05/26/1941. He is scheduled to undergo a surveillance colonoscopy today.  Past medical history: Coronary artery disease. Myocardial infarction with left anterior descending coronary artery stent placement in December 1999. Prostate cancer treated with seed implant therapy. Right arm melanoma surgery. Benign positional vertigo. Hiatal hernia associated with an esophageal stricture dilated in the past. Hypercholesterolemia. Gastroesophageal reflux. Raynaud's phenomenon. Ischemic cardiomyopathy. Peripheral vascular disease. Claudication. Left inguinal hernia repair.  Exam: The patient is alert and lying comfortably on the endoscopy stretcher. Abdomen soft and nontender to palpation. Lungs are clear to auscultation. Cardiac exam reveals a regular rhythm.  Plan: Proceed with surveillance colonoscopy

## 2015-11-24 NOTE — Anesthesia Preprocedure Evaluation (Addendum)
Anesthesia Evaluation  Patient identified by MRN, date of birth, ID band Patient awake    Reviewed: Allergy & Precautions, NPO status , Patient's Chart, lab work & pertinent test results  Airway Mallampati: II  TM Distance: >3 FB Neck ROM: Full    Dental no notable dental hx.    Pulmonary neg pulmonary ROS,    Pulmonary exam normal breath sounds clear to auscultation       Cardiovascular hypertension, Pt. on medications + CAD and + Peripheral Vascular Disease  Normal cardiovascular exam Rhythm:Regular Rate:Normal     Neuro/Psych  Neuromuscular disease negative psych ROS   GI/Hepatic Neg liver ROS, hiatal hernia,   Endo/Other  negative endocrine ROS  Renal/GU negative Renal ROS  negative genitourinary   Musculoskeletal negative musculoskeletal ROS (+)   Abdominal   Peds negative pediatric ROS (+)  Hematology negative hematology ROS (+)   Anesthesia Other Findings   Reproductive/Obstetrics negative OB ROS                             Anesthesia Physical Anesthesia Plan  ASA: III  Anesthesia Plan: MAC   Post-op Pain Management:    Induction: Intravenous  Airway Management Planned: Natural Airway  Additional Equipment:   Intra-op Plan:   Post-operative Plan:   Informed Consent: I have reviewed the patients History and Physical, chart, labs and discussed the procedure including the risks, benefits and alternatives for the proposed anesthesia with the patient or authorized representative who has indicated his/her understanding and acceptance.   Dental advisory given  Plan Discussed with: CRNA  Anesthesia Plan Comments:         Anesthesia Quick Evaluation

## 2015-11-25 ENCOUNTER — Encounter (HOSPITAL_COMMUNITY): Payer: Self-pay | Admitting: Gastroenterology

## 2016-08-22 ENCOUNTER — Other Ambulatory Visit: Payer: Self-pay | Admitting: Interventional Cardiology

## 2016-08-30 ENCOUNTER — Ambulatory Visit (INDEPENDENT_AMBULATORY_CARE_PROVIDER_SITE_OTHER): Payer: Medicare Other | Admitting: Interventional Cardiology

## 2016-08-30 ENCOUNTER — Encounter: Payer: Self-pay | Admitting: Interventional Cardiology

## 2016-08-30 VITALS — BP 122/86 | HR 43 | Ht 72.0 in | Wt 202.2 lb

## 2016-08-30 DIAGNOSIS — I10 Essential (primary) hypertension: Secondary | ICD-10-CM | POA: Diagnosis not present

## 2016-08-30 DIAGNOSIS — R001 Bradycardia, unspecified: Secondary | ICD-10-CM

## 2016-08-30 DIAGNOSIS — I739 Peripheral vascular disease, unspecified: Secondary | ICD-10-CM | POA: Diagnosis not present

## 2016-08-30 DIAGNOSIS — I5042 Chronic combined systolic (congestive) and diastolic (congestive) heart failure: Secondary | ICD-10-CM

## 2016-08-30 DIAGNOSIS — I2510 Atherosclerotic heart disease of native coronary artery without angina pectoris: Secondary | ICD-10-CM | POA: Diagnosis not present

## 2016-08-30 MED ORDER — BENAZEPRIL-HYDROCHLOROTHIAZIDE 20-12.5 MG PO TABS: 1.0000 | ORAL_TABLET | Freq: Every day | ORAL | 3 refills | Status: DC

## 2016-08-30 MED ORDER — METOPROLOL TARTRATE 25 MG PO TABS: 25.0000 mg | ORAL_TABLET | Freq: Two times a day (BID) | ORAL | 3 refills | Status: DC

## 2016-08-30 NOTE — Progress Notes (Signed)
Cardiology Office Note    Date:  08/30/2016   ID:  [name], [name] March 22, 1941, MRN [id number]  PCP:  Henrine Screws, MD  Cardiologist: Sinclair Grooms, MD   Chief Complaint  Patient presents with  . Coronary Artery Disease    History of Present Illness:  Ronald Duran is a 76 y.o. male who has a PMH of CAD s/p MI with PCI to LAD in 07/1998, h/o ICM with EF 40% by echo 2005, HTN, HLD and prediabetes. Relatively recent cath demonstrated total occlusion of the LAD with plentiful collaterals from the RCA which is a dominant vessel. Also has PAD with predominantly right leg claudication that is improved with a walking program.  He denies angina.He denies dyspnea. He is staying active. No medication side effects. No episodes of syncope. He has seen Dr. Fletcher Anon for claudication and was prescribed an exercise program. He is markedly improved with this program. No other complaints from cardiac standpoint.   Past Medical History:  Diagnosis Date  . CAD (coronary artery disease)    a. s/p MI with LAD stent placement December, 1999 -last stress test November 2008 with no EKG evidence of ischemia  b. cath 07/23/2015 after abnormal ETT and nuc, occluded mid LAD stent with good distal collateral, medical therapy   . Cardiomyopathy ([place])    Ischemic cardiomyopathy with EF 40% by echo 2005  . Dyslipidemia   . Erectile dysfunction   . Hiatal hernia    with esophageal strictures and dilatations in the past - Dr. Delfin Edis  . History of nuclear stress test    Myoview 12/16: EF 47%, anterior, anteroseptal and inferoseptal defect suggesting infarct with peri-infarct ischemia, intermediate risk  . HTN (hypertension)   . Hx of seasonal allergies   . Hyperlipidemia   . PAD (peripheral artery disease) ([place])   . Pre-diabetes   . Prostate cancer [facility])    with seed implant therapy following Lupron therapy in 2008 and 2009 - followed by [name]  . Ringworm   . Vitamin D deficiency      Past Surgical History:  Procedure Laterality Date  . CARDIAC CATHETERIZATION N/A 07/23/2015   Procedure: Left Heart Cath and Coronary Angiography;  Surgeon: Belva Crome, MD;  Location: [place] [facility];  Service: Cardiovascular;  Laterality: N/A;  . COLONOSCOPY WITH PROPOFOL N/A 11/24/2015   Procedure: COLONOSCOPY WITH PROPOFOL;  Surgeon: Garlan Fair, MD;  Location: [facility];  Service: Endoscopy;  Laterality: N/A;  . esophageal strictures with dilatations in the past per Dr. Delfin Edis     last '98  . HERNIA REPAIR Left   . LAD stent placement - Dr. Daneen Schick    . left inguinal hernia repair - Dr. Magdalene River    . prostatic radioactive seed implantation - Dr. Valere Dross     '09    Current Medications: Outpatient Medications Prior to Visit  Medication Sig Dispense Refill  . aspirin EC 81 MG tablet Take 81 mg by mouth daily.    . B Complex Vitamins (B COMPLEX 100) tablet Take 1 tablet by mouth daily.    . cetirizine (ZYRTEC) 10 MG tablet Take 10 mg by mouth daily.    . Cholecalciferol (VITAMIN D3) 2000 UNITS TABS Take 1 tablet by mouth daily.     . Coenzyme Q10 (CO Q-10) 100 MG CAPS Take 1 capsule by mouth daily.     . Multiple Vitamins-Minerals (CENTRUM) tablet Take 1 tablet by mouth daily.    Marland Kitchen  niacin (NIASPAN) 500 MG CR tablet Take 1,000 mg by mouth at bedtime.     Marland Kitchen NITROSTAT 0.4 MG SL tablet place 1 tablet under the tongue if needed every 5 minutes for chest pain for 3 doses IF NO RELIEF AFTER 3RD DOSE CALL PRESCRIBER OR 911. 25 tablet 5  . Omega-3 Fatty Acids (OMEGA 3 PO) Take 2,400 mg by mouth daily.    Marland Kitchen omeprazole (PRILOSEC) 20 MG capsule Take 20 mg by mouth every other day.     . simvastatin (ZOCOR) 80 MG tablet Take 40 mg by mouth daily.     . benazepril-hydrochlorthiazide (LOTENSIN HCT) 20-12.5 MG tablet take 1 tablet by mouth once daily 30 tablet 0  . metoprolol (LOPRESSOR) 50 MG tablet Take 25-50 mg by mouth 2 (two) times daily. Take 1/2 tablet (25mg ) in  the morning and 1 tablet (50mg ) in the evening     No facility-administered medications prior to visit.      Allergies:   Patient has no known allergies.   Social History   Social History  . Marital status: Married    Spouse name: N/A  . Number of children: N/A  . Years of education: N/A   Social History Main Topics  . Smoking status: Never Smoker  . Smokeless tobacco: Never Used  . Alcohol use 0.0 oz/week     Comment: occasionally for special occasions    . Drug use: No  . Sexual activity: Not Asked   Other Topics Concern  . None   Social History Narrative  . None     Family History:  The patient's family history includes CAD in his sister; CVA in his mother; CVA (age of onset: 57) in his father.   ROS:   Please see the history of present illness.    No medication side effects. LDL is suboptimally managed at 84 on simvastatin and niacin. This is managed by primary care.  All other systems reviewed and are negative.   PHYSICAL EXAM:   VS:  BP 122/86 (BP Location: Left Arm)   Pulse (!) 43   Ht 6' (1.829 m)   Wt 202 lb 3.2 oz (91.7 kg)   BMI 27.42 kg/m    GEN: Well nourished, well developed, in no acute distress  HEENT: normal  Neck: no JVD, carotid bruits, or masses Cardiac: RRR; no murmurs, rubs, or gallops,no edema .Pulses are diminished in both lower extremities.  Respiratory:  clear to auscultation bilaterally, normal work of breathing GI: soft, nontender, nondistended, + BS MS: no deformity or atrophy  Skin: warm and dry, no rash Neuro:  Alert and Oriented x 3, Strength and sensation are intact Psych: euthymic mood, full affect  Wt Readings from Last 3 Encounters:  08/30/16 202 lb 3.2 oz (91.7 kg)  11/24/15 202 lb (91.6 kg)  09/08/15 202 lb (91.6 kg)      Studies/Labs Reviewed:   EKG:  EKG  Marked sinus bradycardia at 43 bpm with normal PR interval and QS pattern V1 through V3.  Recent Labs: No results found for requested labs within last 8760  hours.   Lipid Panel No results found for: CHOL, TRIG, HDL, CHOLHDL, VLDL, LDLCALC, LDLDIRECT  Additional studies/ records that were reviewed today include:  Reviewed laboratory data from Dr. Mertha Finders. LDL is 84.   ASSESSMENT:    1. Coronary artery disease involving native coronary artery of native heart without angina pectoris   2. Chronic combined systolic and diastolic heart failure, NYHA class 1 (  [place])   3. Essential hypertension   4. Claudication of right lower extremity ([place])   5. Bradycardia      PLAN:  In order of problems listed above:  1. Cardiac status is stable. He denies both dyspnea and angina with moderate physical activity. His EKG does demonstrate anteroseptal Q waves I every this is unchanged compared to one year ago. No change in therapy other than slightly decreasing metoprolol intensity because of marked resting bradycardia. 2. There is no evidence of volume overload. Continue beta blocker therapy as listed along with ACE inhibitor therapy to prevent progressive left ventricular enlargement and dysfunction. 3. 2 g sodium diet, weight control, an exerciser abdicated. A pressure target 140/90 mmHg or less. 4. Marked improvement in right calf claudication. He felt that there may be left lower extremity involvement however the walking program has abolish in the left leg complaint. He will see Dr.Arida soon. 5. Marked sinus bradycardia. Will slightly trim the beta blocker dose which should help improve this. This may also improve claudication. He needs to remain on beta blocker therapy for preservation of LV size and function and also to diminish potential anterior wall ischemia. 6.     Medication Adjustments/Labs and Tests Ordered: Current medicines are reviewed at length with the patient today.  Concerns regarding medicines are outlined above.  Medication changes, Labs and Tests ordered today are listed in the Patient Instructions below. Patient Instructions   Medication Instructions:  1) DECREASE Metoprolol Tartrate to 25mg  twice daily  Labwork: None  Testing/Procedures: None  Follow-Up: Your physician wants you to follow-up in: 1 year with Dr. Tamala Julian.  You will receive a reminder letter in the mail two months in advance. If you don't receive a letter, please call our office to schedule the follow-up appointment.   Any Other Special Instructions Will Be Listed Below (If Applicable).     If you need a refill on your cardiac medications before your next appointment, please call your pharmacy.      Signed, Sinclair Grooms, MD  08/30/2016 9:48 AM    [address], [place], [place]  [address] Phone: [phone]; Fax: [phone]

## 2016-08-30 NOTE — Patient Instructions (Signed)
Medication Instructions:  1) DECREASE Metoprolol Tartrate to 25mg  twice daily  Labwork: None  Testing/Procedures: None  Follow-Up: Your physician wants you to follow-up in: 1 year with Dr. Tamala Julian.  You will receive a reminder letter in the mail two months in advance. If you don't receive a letter, please call our office to schedule the follow-up appointment.   Any Other Special Instructions Will Be Listed Below (If Applicable).     If you need a refill on your cardiac medications before your next appointment, please call your pharmacy.

## 2016-09-12 NOTE — Progress Notes (Signed)
Cardiology Office Note   Date:  09/13/2016   ID:  Ronald Duran, DOB 1940/09/02, MRN [id number]  PCP:  Henrine Screws, MD  Cardiologist: Dr. Tamala Julian   No chief complaint on file.     History of Present Illness: Ronald Duran is a 76 y.o. male who presents for a follow-up visit regarding peripheral arterial disease. He has a PMH of CAD , h/o ICM with EF 40% by echo 2005, HTN, HLD and prediabetes.   He was seen last year for right calf claudication which was mild overall. He underwent noninvasive vascular evaluation which showed an ABI of 0.8 on the right and 1.0 on the left. Waveforms suggested SFA or popliteal disease.  He is not a smoker and has no diabetes. He was treated medically. He was seen recently by Dr. Tamala Julian with no new complaints. He has been doing extremely well and denies any chest pain or shortness of breath. He continues to be very active and walks daily for 45 minutes. He plays golf frequently. He describes minimal discomfort in the right calf that does not affect his ability to continue. He did have some left calf discomfort last year as well as assess also resolved completely. He was recently noted to be bradycardic and the dose of metoprolol was decreased to 25 mg twice daily. He denies any dizziness, syncope or presyncope.    Past Medical History:  Diagnosis Date  . CAD (coronary artery disease)    a. s/p MI with LAD stent placement December, 1999 -last stress test November 2008 with no EKG evidence of ischemia  b. cath 07/23/2015 after abnormal ETT and nuc, occluded mid LAD stent with good distal collateral, medical therapy   . Cardiomyopathy ([place])    Ischemic cardiomyopathy with EF 40% by echo 2005  . Dyslipidemia   . Erectile dysfunction   . Hiatal hernia    with esophageal strictures and dilatations in the past - Dr. Delfin Edis  . History of nuclear stress test    Myoview 12/16: EF 47%, anterior, anteroseptal and inferoseptal defect suggesting  infarct with peri-infarct ischemia, intermediate risk  . HTN (hypertension)   . Hx of seasonal allergies   . Hyperlipidemia   . PAD (peripheral artery disease) ([place])   . Pre-diabetes   . Prostate cancer [facility])    with seed implant therapy following Lupron therapy in 2008 and 2009 - followed by [name]  . Ringworm   . Vitamin D deficiency     Past Surgical History:  Procedure Laterality Date  . CARDIAC CATHETERIZATION N/A 07/23/2015   Procedure: Left Heart Cath and Coronary Angiography;  Surgeon: Belva Crome, MD;  Location: [place] [facility];  Service: Cardiovascular;  Laterality: N/A;  . COLONOSCOPY WITH PROPOFOL N/A 11/24/2015   Procedure: COLONOSCOPY WITH PROPOFOL;  Surgeon: Garlan Fair, MD;  [facility];  Service: Endoscopy;  Laterality: N/A;  . esophageal strictures with dilatations in the past per Dr. Delfin Edis     last '98  . HERNIA REPAIR Left   . LAD stent placement - Dr. Daneen Schick    . left inguinal hernia repair - Dr. Magdalene River    . prostatic radioactive seed implantation - Dr. Valere Dross     '09     Current Outpatient Prescriptions  Medication Sig Dispense Refill  . aspirin EC 81 MG tablet Take 81 mg by mouth daily.    . B Complex Vitamins (B COMPLEX 100) tablet Take 1 tablet  by mouth daily.    . benazepril-hydrochlorthiazide (LOTENSIN HCT) 20-12.5 MG tablet Take 1 tablet by mouth daily. 90 tablet 3  . cetirizine (ZYRTEC) 10 MG tablet Take 10 mg by mouth daily.    . Cholecalciferol (VITAMIN D3) 2000 UNITS TABS Take 1 tablet by mouth daily.     . Coenzyme Q10 (CO Q-10) 100 MG CAPS Take 1 capsule by mouth daily.     . metoprolol tartrate (LOPRESSOR) 25 MG tablet Take 1 tablet (25 mg total) by mouth 2 (two) times daily. 180 tablet 3  . Multiple Vitamins-Minerals (CENTRUM) tablet Take 1 tablet by mouth daily.    . niacin (NIASPAN) 500 MG CR tablet Take 1,000 mg by mouth at bedtime.     Marland Kitchen NITROSTAT 0.4 MG SL tablet place 1 tablet under the tongue  if needed every 5 minutes for chest pain for 3 doses IF NO RELIEF AFTER 3RD DOSE CALL PRESCRIBER OR 911. 25 tablet 5  . Omega-3 Fatty Acids (OMEGA 3 PO) Take 2,400 mg by mouth daily.    Marland Kitchen omeprazole (PRILOSEC) 20 MG capsule Take 20 mg by mouth every other day.     . simvastatin (ZOCOR) 80 MG tablet Take 40 mg by mouth daily.      No current facility-administered medications for this visit.     Allergies:   Patient has no known allergies.    Social History:  The patient  reports that he has never smoked. He has never used smokeless tobacco. He reports that he drinks alcohol. He reports that he does not use drugs.   Family History:  The patient's family history includes CAD in his sister; CVA in his mother; CVA (age of onset: 16) in his father.    ROS:  Please see the history of present illness.   Otherwise, review of systems are positive for none.   All other systems are reviewed and negative.    PHYSICAL EXAM: VS:  BP 124/80   Pulse (!) 48   Ht 6' (1.829 m)   Wt 202 lb (91.6 kg)   SpO2 97%   BMI 27.40 kg/m  , BMI Body mass index is 27.4 kg/m. GEN: Well nourished, well developed, in no acute distress  HEENT: normal  Neck: no JVD, carotid bruits, or masses Cardiac: RRR; no murmurs, rubs, or gallops,no edema  Respiratory:  clear to auscultation bilaterally, normal work of breathing GI: soft, nontender, nondistended, + BS MS: no deformity or atrophy  Skin: warm and dry, no rash Neuro:  Strength and sensation are intact Psych: euthymic mood, full affect   EKG:  EKG is not ordered today.    Recent Labs: No results found for requested labs within last 8760 hours.    Lipid Panel No results found for: CHOL, TRIG, HDL, CHOLHDL, VLDL, LDLCALC, LDLDIRECT    Wt Readings from Last 3 Encounters:  09/13/16 202 lb (91.6 kg)  08/30/16 202 lb 3.2 oz (91.7 kg)  11/24/15 202 lb (91.6 kg)       No flowsheet data found.    ASSESSMENT AND PLAN:  1.  Peripheral arterial  disease with mild right leg claudication: His symptoms are clearly not lifestyle limiting and have improved significantly with a walking program. Continue medical therapy. No indication for revascularization.  2. Coronary artery disease involving native coronary arteries: He has no anginal symptoms.  3. Hyperlipidemia: Most recent lipid profile in June showed an LDL of 84. Given his coronary artery disease and peripheral artery disease, I recommend  a target LDL of less than 70. Consider switching to atorvastatin or rosuvastatin.  4. Bradycardia: Overall asymptomatic. This has improved after decreasing the dose of metoprolol.    Disposition:   FU with me as needed if claudication worsens.   Signed,  Kathlyn [place], MD  09/13/2016 9:42 AM    [facility]

## 2016-09-13 ENCOUNTER — Ambulatory Visit (INDEPENDENT_AMBULATORY_CARE_PROVIDER_SITE_OTHER): Payer: Medicare Other | Admitting: Cardiovascular Disease

## 2016-09-13 ENCOUNTER — Encounter: Payer: Self-pay | Admitting: Cardiovascular Disease

## 2016-09-13 VITALS — BP 124/80 | HR 48 | Ht 72.0 in | Wt 202.0 lb

## 2016-09-13 DIAGNOSIS — I2510 Atherosclerotic heart disease of native coronary artery without angina pectoris: Secondary | ICD-10-CM | POA: Diagnosis not present

## 2016-09-13 DIAGNOSIS — R21 Rash and other nonspecific skin eruption: Secondary | ICD-10-CM | POA: Insufficient documentation

## 2016-09-13 DIAGNOSIS — Z0000 Encounter for general adult medical examination without abnormal findings: Secondary | ICD-10-CM | POA: Insufficient documentation

## 2016-09-13 DIAGNOSIS — R739 Hyperglycemia, unspecified: Secondary | ICD-10-CM | POA: Insufficient documentation

## 2016-09-13 DIAGNOSIS — K222 Esophageal obstruction: Secondary | ICD-10-CM | POA: Insufficient documentation

## 2016-09-13 DIAGNOSIS — L820 Inflamed seborrheic keratosis: Secondary | ICD-10-CM | POA: Insufficient documentation

## 2016-09-13 DIAGNOSIS — I739 Peripheral vascular disease, unspecified: Secondary | ICD-10-CM

## 2016-09-13 DIAGNOSIS — R259 Unspecified abnormal involuntary movements: Secondary | ICD-10-CM | POA: Insufficient documentation

## 2016-09-13 DIAGNOSIS — E7800 Pure hypercholesterolemia, unspecified: Secondary | ICD-10-CM | POA: Diagnosis not present

## 2016-09-13 DIAGNOSIS — Z1331 Encounter for screening for depression: Secondary | ICD-10-CM | POA: Insufficient documentation

## 2016-09-13 DIAGNOSIS — Z86010 Personal history of colonic polyps: Secondary | ICD-10-CM | POA: Insufficient documentation

## 2016-09-13 DIAGNOSIS — Z0001 Encounter for general adult medical examination with abnormal findings: Secondary | ICD-10-CM | POA: Insufficient documentation

## 2016-09-13 DIAGNOSIS — M1000 Idiopathic gout, unspecified site: Secondary | ICD-10-CM | POA: Insufficient documentation

## 2016-09-13 DIAGNOSIS — Z7901 Long term (current) use of anticoagulants: Secondary | ICD-10-CM | POA: Insufficient documentation

## 2016-09-13 DIAGNOSIS — R001 Bradycardia, unspecified: Secondary | ICD-10-CM | POA: Diagnosis not present

## 2016-09-13 DIAGNOSIS — Z79899 Other long term (current) drug therapy: Secondary | ICD-10-CM | POA: Insufficient documentation

## 2016-09-13 DIAGNOSIS — J40 Bronchitis, not specified as acute or chronic: Secondary | ICD-10-CM | POA: Insufficient documentation

## 2016-09-13 DIAGNOSIS — L989 Disorder of the skin and subcutaneous tissue, unspecified: Secondary | ICD-10-CM | POA: Insufficient documentation

## 2016-09-13 DIAGNOSIS — I252 Old myocardial infarction: Secondary | ICD-10-CM | POA: Insufficient documentation

## 2016-09-13 DIAGNOSIS — R030 Elevated blood-pressure reading, without diagnosis of hypertension: Secondary | ICD-10-CM | POA: Insufficient documentation

## 2016-09-13 NOTE — Patient Instructions (Signed)

## 2016-12-15 IMAGING — NM NM MISC PROCEDURE
3 series · 18 of 18 positions shown · non-contrast
Comparison: none

[Series 1: stress-gsp-mc summed_(id)_sa · 6.4mm · 6.40mm/px · 6 of 64 frames shown]
[frame 6/64]
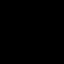
[frame 16/64]
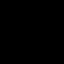
[frame 27/64]
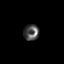
[frame 38/64]
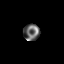
[frame 48/64]
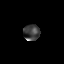
[frame 59/64]
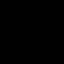

[Series 1: stress-gsp-mc_(id)_sa · 6.4mm · 6.40mm/px · 6 of 448 frames shown]
[frame 38/448]
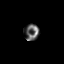
[frame 112/448]
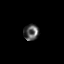
[frame 187/448]
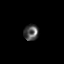
[frame 262/448]
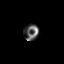
[frame 336/448]
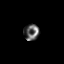
[frame 411/448]
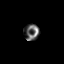

[Series 1: rest_(id)_sa · 6.4mm · 6.40mm/px · 6 of 64 frames shown]
[frame 6/64]
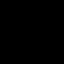
[frame 16/64]
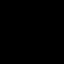
[frame 27/64]
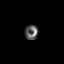
[frame 38/64]
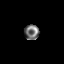
[frame 48/64]
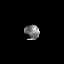
[frame 59/64]
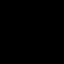

[18 of 18 positions shown; findings below may reference images not displayed]

Canned report from images found in remote index.

Refer to host system for actual result text.

## 2017-01-16 ENCOUNTER — Other Ambulatory Visit: Payer: Self-pay | Admitting: Interventional Cardiology

## 2017-01-16 ENCOUNTER — Other Ambulatory Visit: Payer: Self-pay | Admitting: *Deleted

## 2017-01-16 MED ORDER — NITROGLYCERIN 0.4 MG SL SUBL: SUBLINGUAL_TABLET | SUBLINGUAL | 1 refills | Status: DC

## 2017-04-20 DIAGNOSIS — I639 Cerebral infarction, unspecified: Secondary | ICD-10-CM

## 2017-04-20 HISTORY — DX: Cerebral infarction, unspecified: I63.9

## 2017-04-24 ENCOUNTER — Encounter: Payer: Self-pay | Admitting: Interventional Cardiology

## 2017-04-24 DIAGNOSIS — I639 Cerebral infarction, unspecified: Secondary | ICD-10-CM | POA: Insufficient documentation

## 2017-05-22 NOTE — Progress Notes (Signed)
CARDIOLOGY OFFICE NOTE  Date:  05/23/2017    Ronald Duran Date of Birth: 29-Dec-1940 Medical Record #[id number]  PCP:  Josetta Huddle, MD  Cardiologist:  Tamala Julian  Chief Complaint  Patient presents with  . Cerebrovascular Accident    Post hospital visit - seen for Dr. Tamala Julian.     History of Present Illness: Ronald Duran is a 76 y.o. male who presents today for a post hospital visit. Seen for Dr. Tamala Julian.   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 Last cath demonstrated total occlusion of the LAD with plentiful collaterals from the RCA which is a dominant vessel. Also has PAD with predominantly right leg claudication that is improved with a walking program - followed by Dr. Fletcher Anon.  Last seen by Dr. Fletcher Anon back in February. Saw Dr. Tamala Julian back in January. Both felt he was stable.   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 Zocor changed to Lipitor. Echo then showed apical thrombus. Seen by cardiology who recommended stopping Plavix and placing on Lovenox and Coumadin. Felt to probably need life long anticoagulation.    Comes in today. Here alone. He is trembling - says its from "getting stress with the parking and having to fill out forms here today". Admits to having really "bad anxiety". But also with essential tremor. Son getting chemo for testicular cancer - he has been there trying to help out and that is why he went to [place]. He is on aspirin and coumadin - understands this is lifelong. He has been getting his INR checked at [facility] but will be transitioning to Dr. Inda Merlin next week. Last INR on Friday was 2.6. He says he is doing ok. No real residual from the stroke. No bleeding/bruising. No  chest pain. Breathing is ok. BP is better at home - he admits to having some degree of white coat syndrome.   Past Medical History:  Diagnosis Date  . CAD (coronary artery disease)    a. s/p MI with LAD stent placement December, 1999 -last stress test November 2008 with no EKG evidence of ischemia  b. cath 07/23/2015 after abnormal ETT and nuc, occluded mid LAD stent with good distal collateral, medical therapy   . Cardiomyopathy ([place])    Ischemic cardiomyopathy with EF 40% by echo 2005  . Dyslipidemia   . Erectile dysfunction   . Hiatal hernia    with esophageal strictures and dilatations in the past - Dr. Delfin Edis  . History of nuclear stress test    Myoview 12/16: EF 47%, anterior, anteroseptal and inferoseptal defect suggesting infarct with peri-infarct ischemia, intermediate risk  . HTN (hypertension)   . Hx of seasonal allergies   . Hyperlipidemia   . PAD (peripheral artery disease) ([place])   . Pre-diabetes   . Prostate cancer [facility])    with seed implant therapy following Lupron therapy in 2008 and 2009 - followed by [name]  . Ringworm   . Vitamin D deficiency     Past Surgical History:  Procedure Laterality Date  . CARDIAC CATHETERIZATION N/A 07/23/2015   Procedure: Left Heart Cath and Coronary Angiography;  Surgeon: Belva Crome, MD;  Location: [facility];  Service: Cardiovascular;  Laterality: N/A;  . COLONOSCOPY WITH PROPOFOL N/A 11/24/2015   Procedure: COLONOSCOPY WITH PROPOFOL;  Surgeon: Garlan Fair, MD;  [facility];  Service: Endoscopy;  Laterality: N/A;  . esophageal strictures with dilatations in the past per Dr. Delfin Edis     last '98  . HERNIA REPAIR Left   . LAD stent placement - Dr. Daneen Schick    . left inguinal hernia repair - Dr. Magdalene River    . prostatic radioactive seed implantation - Dr. Valere Dross     '09     Medications: Current Meds  Medication Sig  . aspirin 81 MG chewable tablet Chew 81 mg by mouth daily.  Marland Kitchen  atorvastatin (LIPITOR) 40 MG tablet Take 40 mg by mouth.  . B Complex Vitamins (B COMPLEX 100) tablet Take 1 tablet by mouth daily.  . benazepril-hydrochlorthiazide (LOTENSIN HCT) 20-12.5 MG tablet Take 1 tablet by mouth daily.  . cetirizine (ZYRTEC) 10 MG tablet Take 10 mg by mouth daily.  . Cholecalciferol (VITAMIN D3) 2000 UNITS TABS Take 1 tablet by mouth daily.   . Coenzyme Q10 (CO Q-10) 100 MG CAPS Take 1 capsule by mouth daily.   . metoprolol tartrate (LOPRESSOR) 25 MG tablet Take 1 tablet (25 mg total) by mouth 2 (two) times daily.  . Multiple Vitamins-Minerals (CENTRUM) tablet Take 1 tablet by mouth daily.  . niacin (NIASPAN) 500 MG CR tablet Take 1,000 mg by mouth at bedtime.   . nitroGLYCERIN (NITROSTAT) 0.4 MG SL tablet place 1 tablet under the tongue if needed every 5 minutes for chest pain for 3 doses IF NO RELIEF AFTER 3RD DOSE CALL PRESCRIBER OR 911.  . Omega-3 Fatty Acids (OMEGA 3 PO) Take 2,400 mg by mouth daily.  Marland Kitchen omeprazole (PRILOSEC) 20 MG capsule Take 20 mg by mouth every other day.   . warfarin (COUMADIN) 5 MG tablet take 1 tablet by mouth once daily or as directed     Allergies: No Known Allergies  Social History: The patient  reports that he has never smoked. He has never used smokeless tobacco. He reports that he drinks alcohol. He reports that he does not use drugs.   Family History: The patient's family history includes CAD in his sister; CVA in his mother; CVA (age of onset: 76) in his father.   Review of Systems: Please see the history of present illness.   Otherwise, the review of systems is positive for none.   All other systems are reviewed and negative.   Physical Exam: VS:  BP (!) 160/80 (BP Location: Left Arm, Patient Position: Sitting, Cuff Size: Normal)   Pulse 64   Ht 6' (1.829 m)   Wt 205 lb 1.9 oz (93 kg)   BMI 27.82 kg/m  .  BMI Body mass index is 27.82 kg/m.  Wt Readings from Last 3 Encounters:  05/23/17 205 lb 1.9 oz (93 kg)    09/13/16 202 lb (91.6 kg)  08/30/16 202 lb 3.2 oz (91.7 kg)   BP recheck by me is down to 140/80.   General: Anxious. Very talkative.  Alert and in no acute distress.   HEENT: Normal.  Neck: Supple, no JVD, carotid bruits, or masses noted.  Cardiac: Regular rate and rhythm. No murmurs, rubs, or gallops. No edema.  Respiratory:  Lungs are clear to auscultation bilaterally with normal work of breathing.  GI: Soft and nontender.  MS: No deformity or atrophy. Gait and ROM  intact.  Skin: Warm and dry. Color is normal.  Neuro:  Strength and sensation are intact and no gross focal deficits noted.  Psych: Alert, appropriate and with normal affect.   LABORATORY DATA:  EKG:  EKG is not ordered today.  Lab Results  Component Value Date   WBC 9.7 07/21/2015   HGB 17.0 07/21/2015   HCT 47.9 07/21/2015   PLT 132 (L) 07/21/2015   GLUCOSE 118 (H) 07/21/2015   ALT 37 10/08/2007   AST 31 10/08/2007   NA 140 07/21/2015   K 4.1 07/21/2015   CL 102 07/21/2015   CREATININE 1.23 (H) 07/21/2015   BUN 16 07/21/2015   CO2 27 07/21/2015   INR 1.00 07/21/2015     BNP (last 3 results) No results for input(s): BNP in the last 8760 hours.  ProBNP (last 3 results) No results for input(s): PROBNP in the last 8760 hours.   Other Studies Reviewed Today:  LIMITED ECHO IMPRESSIONS 04/2017  Limited echocardiogram to follow-up on left ventricular apical thrombus.  Utilizing Definity contrast, a 1.3 cm x 2 cm round left ventricular apical clot is identified.  FINDINGS  Left Ventricle .45 mL definity was used.Oval Linsey, RN injected.  Apical clot is visualized. 2cm X 1.3cm  ECHO IMPRESSIONS 04/2017  Normal global left ventricular size with reducedsystolic function. Akinetic apex. Appears to be thrombus in apex, measuring  approximately 1.4 cm by 1.4 cm. Mild left ventricular hypertrophy. Mildly abnormal left ventricular ejection fraction estimated at  40-45%. Septal E/E' ratio is 11.1  indicating normal filling pressure. Abnormal relaxation filling pattern of the left ventricle for age  (stage 1 diastolic dysfunction). No previous echo for comparison. An interatrial shunt was not identified with and without Valsalva.  Left atrial dilatation. Mitral annular calcification. Mild thickening/calcification of the mitral valve leaflets. Trace to mild mitral regurgitation.  Aortic valve appears to be sclerotic without evidence of stenosis. No significant aortic regurgitation noted.  Trace tricuspid regurgitation. Unable to estimate PASP.  FINDINGS  Left Ventricle Normal global left ventricular size with reducedsystolic function. Akinetic apex. Appears to be thrombus  in apex, measuring approximately 1.4 cm by 1.4 cm. Mild left ventricular hypertrophy. Mildly abnormal left  ventricular ejection fraction estimated at 40-45%. Septal E/E' ratio is 11.1 indicating normal filling pressure.  Abnormal relaxation filling pattern of the left ventricle for age (stage 1 diastolic dysfunction). No previous  echo for comparison.  Right Ventricle Right ventricle not well visualized, but appears normal.  Right Atrium Normal right atrial size. 75mL of agitated saline were injected into patient's left antecubital by [place],  RN. An interatrial shunt was not identified with and without Valsalva.  Left Atrium Left atrial dilatation.  Mitral Valve Normal function and mobility of the mitral valve leaflets. Mitral annular calcification. Mild  thickening/calcification of the mitral valve leaflets. Trace to mild mitral regurgitation.  Aortic Valve Trileaflet aortic valve. Aortic valve appears to be sclerotic without evidence of stenosis. No significant  aortic regurgitation noted.  Tricuspid Valve Structurally normal tricuspid valve. Trace tricuspid regurgitation. Unable to estimate PASP.  Pulmonic Valve Pulmonic valve not well visualized. No pulmonic regurgitation noted.  Pericardium No obvious  pericardial or pleural effusion.  Vessels Normal size aortic root and proximal ascending aorta. Inferior vena cava not well visualized but appears  grossly normal.  Carotid Doppler Impression 04/2017  There are no significant bilateral internal carotid artery stenoses, normal bilateral vertebral artery flow, heterogeneous plaque is seen  in the bilateral carotid bifurcations.  Esmeralda Links MD   MRI BRAIN IMPRESSION 04/2017: Small acute infarction left frontal lobe.  Signed (Electronic Signature): 04/20/2017 1:55 PM  Signed By: Ledell Noss, MD    Assessment/Plan:  1. 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 To transition his INR over to his PCP next week. Most likely be on life long therapy. No problems noted.   2. Left frontal lobe infarct - noted on MRI  3. CAD - remote PCI - no active chest pain. Would continue with medical management.   4. Ischemic CM - EF of 40 to 45%. On beta blocker and ACE. May need to consider Aldactone in the future.   5. HTN - He says he has better BP control at home - seeing PCP next week - I asked him to take his diary with him for that visit.   6. HLD - now on Lipitor - needs fasting labs with PCP. Would be ok with stopping Niaspan based on prior studies.   Current medicines are reviewed with the patient today.  The patient does not have concerns regarding medicines other than what has been noted above.  The following changes have been made:  See above.  Labs/ tests ordered today include:    Orders Placed This Encounter  Procedures  . Basic metabolic panel  . CBC     Disposition:   FU with Dr. Tamala Julian as planned in January. Lab today.    Patient is agreeable to this plan and will call if any problems develop in the interim.   SignedTruitt Merle, NP  05/23/2017 10:08 AM  [facility] [address] [place], [place]  [address] Phone: [phone] Fax: [phone]

## 2017-05-23 ENCOUNTER — Encounter: Payer: Self-pay | Admitting: Nurse Practitioner

## 2017-05-23 ENCOUNTER — Ambulatory Visit (INDEPENDENT_AMBULATORY_CARE_PROVIDER_SITE_OTHER): Payer: Medicare Other | Admitting: Nurse Practitioner

## 2017-05-23 VITALS — BP 160/80 | HR 64 | Ht 72.0 in | Wt 205.1 lb

## 2017-05-23 DIAGNOSIS — E7800 Pure hypercholesterolemia, unspecified: Secondary | ICD-10-CM

## 2017-05-23 DIAGNOSIS — I513 Intracardiac thrombosis, not elsewhere classified: Secondary | ICD-10-CM

## 2017-05-23 DIAGNOSIS — Z79899 Other long term (current) drug therapy: Secondary | ICD-10-CM | POA: Diagnosis not present

## 2017-05-23 DIAGNOSIS — Z7901 Long term (current) use of anticoagulants: Secondary | ICD-10-CM

## 2017-05-23 DIAGNOSIS — I2510 Atherosclerotic heart disease of native coronary artery without angina pectoris: Secondary | ICD-10-CM

## 2017-05-23 DIAGNOSIS — I5042 Chronic combined systolic (congestive) and diastolic (congestive) heart failure: Secondary | ICD-10-CM

## 2017-05-23 LAB — CBC
Hematocrit: 47.7 % (ref 37.5–51.0)
Hemoglobin: 17 g/dL (ref 13.0–17.7)
MCH: 33 pg (ref 26.6–33.0)
MCHC: 35.6 g/dL (ref 31.5–35.7)
MCV: 93 fL (ref 79–97)
Platelets: 135 10*3/uL — ABNORMAL LOW (ref 150–379)
RBC: 5.15 x10E6/uL (ref 4.14–5.80)
RDW: 12.9 % (ref 12.3–15.4)
WBC: 9.3 10*3/uL (ref 3.4–10.8)

## 2017-05-23 LAB — BASIC METABOLIC PANEL
BUN/Creatinine Ratio: 16 (ref 10–24)
BUN: 15 mg/dL (ref 8–27)
CO2: 24 mmol/L (ref 20–29)
Calcium: 9.6 mg/dL (ref 8.6–10.2)
Chloride: 103 mmol/L (ref 96–106)
Creatinine, Ser: 0.95 mg/dL (ref 0.76–1.27)
GFR calc Af Amer: 91 mL/min/{1.73_m2} (ref >59)
GFR calc non Af Amer: 79 mL/min/{1.73_m2} (ref >59)
Glucose: 114 mg/dL — ABNORMAL HIGH (ref 65–99)
Potassium: 3.8 mmol/L (ref 3.5–5.2)
Sodium: 143 mmol/L (ref 134–144)

## 2017-05-23 NOTE — Patient Instructions (Addendum)
We will be checking the following labs today - BMET and CBC   Medication Instructions:    Continue with your current medicines.     Testing/Procedures To Be Arranged:  N/A  Follow-Up:   See Dr. Tamala Julian in January as planned     Other Special Instructions:   Keep track on your blood pressure - show your diary to Dr. Inda Merlin and bring back when you see Dr. Tamala Julian in January.     If you need a refill on your cardiac medications before your next appointment, please call your pharmacy.   Call the [place] office at [phone] if you have any questions, problems or concerns.

## 2017-08-02 DIAGNOSIS — Z889 Allergy status to unspecified drugs, medicaments and biological substances status: Secondary | ICD-10-CM | POA: Insufficient documentation

## 2017-08-02 DIAGNOSIS — R7303 Prediabetes: Secondary | ICD-10-CM | POA: Insufficient documentation

## 2017-08-02 DIAGNOSIS — Z9289 Personal history of other medical treatment: Secondary | ICD-10-CM | POA: Insufficient documentation

## 2017-08-02 DIAGNOSIS — I739 Peripheral vascular disease, unspecified: Secondary | ICD-10-CM | POA: Insufficient documentation

## 2017-08-02 DIAGNOSIS — E785 Hyperlipidemia, unspecified: Secondary | ICD-10-CM | POA: Insufficient documentation

## 2017-08-02 DIAGNOSIS — I429 Cardiomyopathy, unspecified: Secondary | ICD-10-CM | POA: Insufficient documentation

## 2017-08-02 DIAGNOSIS — I2510 Atherosclerotic heart disease of native coronary artery without angina pectoris: Secondary | ICD-10-CM | POA: Insufficient documentation

## 2017-08-02 DIAGNOSIS — C61 Malignant neoplasm of prostate: Secondary | ICD-10-CM | POA: Insufficient documentation

## 2017-08-02 DIAGNOSIS — I10 Essential (primary) hypertension: Secondary | ICD-10-CM | POA: Insufficient documentation

## 2017-08-22 NOTE — Progress Notes (Signed)
Cardiology Office Note    Date:  08/23/2017   ID:  Ronald, Duran [birth year], MRN [id number]  PCP:  Josetta Huddle, MD  Cardiologist: Sinclair Grooms, MD   Chief Complaint  Patient presents with  . Coronary Artery Disease  . Cerebrovascular Accident    History of Present Illness:  Ronald Duran is a 77 y.o. 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 Relatively recent cath demonstrated total occlusion of the LAD with plentiful collaterals from the RCA which is a dominant vessel. Also has PAD with predominantly right leg claudication that is improved with a walking program.  Recent CVA, September 2018, acute small left frontal felt to be embolic from LV apex.Marland Kitchen  He is doing well.  No recurrent neurological complaints.  He has not had any bleeding complications related to anticoagulation therapy/aspirin.  He denies episodes of angina.  Claudication is stable.  He is walking on a regular basis.   Past Medical History:  Diagnosis Date  . CAD (coronary artery disease)    a. s/p MI with LAD stent placement December, 1999 -last stress test November 2008 with no EKG evidence of ischemia  b. cath 07/23/2015 after abnormal ETT and nuc, occluded mid LAD stent with good distal collateral, medical therapy   . Cardiomyopathy ([place])    Ischemic cardiomyopathy with EF 40% by echo 2005  . Dyslipidemia   . Erectile dysfunction   . Hiatal hernia    with esophageal strictures and dilatations in the past - Dr. Delfin Edis  . History of nuclear stress test    Myoview 12/16: EF 47%, anterior, anteroseptal and inferoseptal defect suggesting infarct with peri-infarct ischemia, intermediate risk  . HTN (hypertension)   . Hx of seasonal allergies   . Hyperlipidemia   . PAD (peripheral artery disease) ([place])   . Pre-diabetes   . Prostate cancer [facility])    with seed implant therapy following Lupron therapy in 2008 and  2009 - followed by [name]  . Ringworm   . Vitamin D deficiency     Past Surgical History:  Procedure Laterality Date  . CARDIAC CATHETERIZATION N/A 07/23/2015   Procedure: Left Heart Cath and Coronary Angiography;  Surgeon: Belva Crome, MD;  Location: [place] [facility];  Service: Cardiovascular;  Laterality: N/A;  . COLONOSCOPY WITH PROPOFOL N/A 11/24/2015   Procedure: COLONOSCOPY WITH PROPOFOL;  Surgeon: Garlan Fair, MD;  [facility];  Service: Endoscopy;  Laterality: N/A;  . esophageal strictures with dilatations in the past per Dr. Delfin Edis     last '98  . HERNIA REPAIR Left   . LAD stent placement - Dr. Daneen Schick    . left inguinal hernia repair - Dr. Magdalene River    . prostatic radioactive seed implantation - Dr. Valere Dross     '09    Current Medications: Outpatient Medications Prior to Visit  Medication Sig Dispense Refill  . aspirin 81 MG chewable tablet Chew 81 mg by mouth daily.    Marland Kitchen atorvastatin (LIPITOR) 40 MG tablet Take 40 mg by mouth.    . B Complex Vitamins (B COMPLEX 100) tablet Take 1 tablet by mouth daily.    . benazepril-hydrochlorthiazide (LOTENSIN HCT) 20-12.5 MG tablet Take 1 tablet by mouth daily. 90 tablet 3  . cetirizine (ZYRTEC) 10 MG tablet Take 10 mg by mouth daily.    Marland Kitchen  Cholecalciferol (VITAMIN D3) 2000 UNITS TABS Take 1 tablet by mouth daily.     . Coenzyme Q10 (CO Q-10) 100 MG CAPS Take 1 capsule by mouth daily.     . Multiple Vitamins-Minerals (CENTRUM) tablet Take 1 tablet by mouth daily.    . niacin (NIASPAN) 500 MG CR tablet Take 1,000 mg by mouth at bedtime.     . nitroGLYCERIN (NITROSTAT) 0.4 MG SL tablet place 1 tablet under the tongue if needed every 5 minutes for chest pain for 3 doses IF NO RELIEF AFTER 3RD DOSE CALL PRESCRIBER OR 911. 25 tablet 1  . Omega-3 Fatty Acids (OMEGA 3 PO) Take 2,400 mg by mouth daily.    Marland Kitchen omeprazole (PRILOSEC) 20 MG capsule Take 20 mg by mouth every other day.     . warfarin (COUMADIN) 5  MG tablet take 1 tablet by mouth once daily or as directed  0  . metoprolol tartrate (LOPRESSOR) 25 MG tablet Take 1 tablet (25 mg total) by mouth 2 (two) times daily. 180 tablet 3   No facility-administered medications prior to visit.      Allergies:   Patient has no known allergies.   Social History   Socioeconomic History  . Marital status: Married    Spouse name: None  . Number of children: None  . Years of education: None  . Highest education level: None  Social Needs  . Financial resource strain: None  . Food insecurity - worry: None  . Food insecurity - inability: None  . Transportation needs - medical: None  . Transportation needs - non-medical: None  Occupational History  . None  Tobacco Use  . Smoking status: Never Smoker  . Smokeless tobacco: Never Used  Substance and Sexual Activity  . Alcohol use: Yes    Alcohol/week: 0.0 oz    Comment: occasionally for special occasions    . Drug use: No  . Sexual activity: None  Other Topics Concern  . None  Social History Narrative  . None     Family History:  The patient's family history includes CAD in his sister; CVA in his mother; CVA (age of onset: 38) in his father.   ROS:   Please see the history of present illness.    No residual neurological complaints.  Difficulty with expressive a aphasia resolved within 60 seconds to 5 minutes of onset September.  Right lower extremity claudication, stable. All other systems reviewed and are negative.   PHYSICAL EXAM:   VS:  BP (!) 146/88   Pulse (!) 52   Ht 6' (1.829 m)   Wt 211 lb 9.6 oz (96 kg)   BMI 28.70 kg/m    GEN: Well nourished, well developed, in no acute distress  HEENT: normal  Neck: no JVD, carotid bruits, or masses Cardiac: RRR; no murmurs, rubs, or gallops,no edema  Respiratory:  clear to auscultation bilaterally, normal work of breathing GI: soft, nontender, nondistended, + BS MS: no deformity or atrophy  Skin: warm and dry, no rash Neuro:   Alert and Oriented x 3, Strength and sensation are intact Psych: euthymic mood, full affect  Wt Readings from Last 3 Encounters:  08/23/17 211 lb 9.6 oz (96 kg)  05/23/17 205 lb 1.9 oz (93 kg)  09/13/16 202 lb (91.6 kg)      Studies/Labs Reviewed:   EKG:  EKG sinus bradycardia, nonspecific T wave flattening.  QS pattern V1 through V3.  Compared to one year ago, the heart rate is faster.  Recent Labs: 05/23/2017: BUN 15; Creatinine, Ser 0.95; Hemoglobin 17.0; Platelets 135; Potassium 3.8; Sodium 143   Lipid Panel No results found for: CHOL, TRIG, HDL, CHOLHDL, VLDL, LDLCALC, LDLDIRECT  Additional studies/ records that were reviewed today include:  LDL was 65 when evaluated in [place] during the acute stroke.    ASSESSMENT:    1. Coronary artery disease of native artery of native heart with stable angina pectoris ([place])   2. Benign essential HTN   3. Chronic systolic heart failure (HCC)   4. 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 PAD (peripheral artery disease) ([place])   6. Dyslipidemia   7. Long term current use of therapeutic drug      PLAN:  In order of problems listed above:  1. Overall doing well.  No angina.  Probable embolic left brain small CVA related to chronic left ventricular apical thrombus.  Now on Coumadin therapy.  Target blood pressure 130/85 mmHg or less. 2. Blood pressures well controlled. 3. No evidence of volume overload. 4. Felt to be embolic secondary to chronic left ventricular apical thrombus.  Now on Coumadin therapy. 5. Stable on a walking program. 6. Documented recent LDL target has been achieved and is less than 70.  We discussed this target for future reference. 7. Coumadin therapy indefinitely to prevent recurrent embolic events.  Overall doing well.  No episodes of syncope.   Medication Adjustments/Labs and Tests Ordered: Current medicines are reviewed at length with the patient today.  Concerns  regarding medicines are outlined above.  Medication changes, Labs and Tests ordered today are listed in the Patient Instructions below. Patient Instructions  Medication Instructions:  Your physician recommends that you continue on your current medications as directed. Please refer to the Current Medication list given to you today.  Labwork: None  Testing/Procedures: None  Follow-Up: Your physician wants you to follow-up in: 1 year with Dr. Tamala Julian.  You will receive a reminder letter in the mail two months in advance. If you don't receive a letter, please call our office to schedule the follow-up appointment.   Any Other Special Instructions Will Be Listed Below (If Applicable).     If you need a refill on your cardiac medications before your next appointment, please call your pharmacy.      Signed, Sinclair Grooms, MD  08/23/2017 10:16 AM    [facility] [place], [place], [place]  [address] Phone: [phone]; Fax: [phone]

## 2017-08-23 ENCOUNTER — Ambulatory Visit (INDEPENDENT_AMBULATORY_CARE_PROVIDER_SITE_OTHER): Payer: Medicare Other | Admitting: Interventional Cardiology

## 2017-08-23 ENCOUNTER — Encounter: Payer: Self-pay | Admitting: Interventional Cardiology

## 2017-08-23 VITALS — BP 146/88 | HR 52 | Ht 72.0 in | Wt 211.6 lb

## 2017-08-23 DIAGNOSIS — I739 Peripheral vascular disease, unspecified: Secondary | ICD-10-CM | POA: Diagnosis not present

## 2017-08-23 DIAGNOSIS — I6349 Cerebral infarction due to embolism of other cerebral artery: Secondary | ICD-10-CM

## 2017-08-23 DIAGNOSIS — I5022 Chronic systolic (congestive) heart failure: Secondary | ICD-10-CM | POA: Diagnosis not present

## 2017-08-23 DIAGNOSIS — I10 Essential (primary) hypertension: Secondary | ICD-10-CM | POA: Diagnosis not present

## 2017-08-23 DIAGNOSIS — I25118 Atherosclerotic heart disease of native coronary artery with other forms of angina pectoris: Secondary | ICD-10-CM | POA: Diagnosis not present

## 2017-08-23 DIAGNOSIS — Z79899 Other long term (current) drug therapy: Secondary | ICD-10-CM | POA: Diagnosis not present

## 2017-08-23 DIAGNOSIS — E785 Hyperlipidemia, unspecified: Secondary | ICD-10-CM | POA: Diagnosis not present

## 2017-08-23 NOTE — Patient Instructions (Signed)

## 2017-09-19 ENCOUNTER — Ambulatory Visit (INDEPENDENT_AMBULATORY_CARE_PROVIDER_SITE_OTHER): Payer: Medicare Other | Admitting: Cardiovascular Disease

## 2017-09-19 ENCOUNTER — Encounter: Payer: Self-pay | Admitting: Cardiovascular Disease

## 2017-09-19 VITALS — BP 110/68 | HR 57 | Ht 72.0 in | Wt 204.6 lb

## 2017-09-19 DIAGNOSIS — I739 Peripheral vascular disease, unspecified: Secondary | ICD-10-CM

## 2017-09-19 DIAGNOSIS — E785 Hyperlipidemia, unspecified: Secondary | ICD-10-CM

## 2017-09-19 DIAGNOSIS — I2510 Atherosclerotic heart disease of native coronary artery without angina pectoris: Secondary | ICD-10-CM

## 2017-09-19 DIAGNOSIS — I10 Essential (primary) hypertension: Secondary | ICD-10-CM | POA: Diagnosis not present

## 2017-09-19 NOTE — Patient Instructions (Signed)
Medication Instructions: Your physician recommends that you continue on your current medications as directed. Please refer to the Current Medication list given to you today.  If you need a refill on your cardiac medications before your next appointment, please call your pharmacy.    Follow-Up: Your physician wants you to follow-up as needed with Dr. Arida.   Thank you for choosing [facility]!!      

## 2017-09-19 NOTE — Progress Notes (Signed)
Cardiology Office Note   Date:  09/19/2017   ID:  Ronald, Duran 03-Nov-1940, MRN [id number]  PCP:  Ronald Huddle, MD  Cardiologist: Dr. Tamala Duran   No chief complaint on file.     History of Present Illness: Ronald Duran is a 77 y.o. male who presents for a follow-up visit regarding peripheral arterial disease. He has a PMH of CAD , h/o ICM with EF 40% by echo 2005, HTN, HLD and prediabetes.   He was seen last year for right calf claudication which was mild overall. He underwent noninvasive vascular evaluation which showed an ABI of 0.8 on the right and 1.0 on the left. Waveforms suggested SFA or popliteal disease.  He is not a smoker and has no diabetes. He was treated medically. He had a CVA in September 2018 due to acute small left frontal lobe infarct felt to be embolic from LV apex.  He is currently on warfarin. He has been doing well overall with no recent chest pain, shortness of breath or palpitations. He reports that he no longer has calf claudication.  He walks regularly with no limitations.   Past Medical History:  Diagnosis Date  . CAD (coronary artery disease)    a. s/p MI with LAD stent placement December, 1999 -last stress test November 2008 with no EKG evidence of ischemia  b. cath 07/23/2015 after abnormal ETT and nuc, occluded mid LAD stent with good distal collateral, medical therapy   . Cardiomyopathy ([place])    Ischemic cardiomyopathy with EF 40% by echo 2005  . Dyslipidemia   . Erectile dysfunction   . Hiatal hernia    with esophageal strictures and dilatations in the past - Dr. Delfin Edis  . History of nuclear stress test    Myoview 12/16: EF 47%, anterior, anteroseptal and inferoseptal defect suggesting infarct with peri-infarct ischemia, intermediate risk  . HTN (hypertension)   . Hx of seasonal allergies   . Hyperlipidemia   . PAD (peripheral artery disease) ([place])   . Pre-diabetes   . Prostate cancer [facility])    with seed implant therapy  following Lupron therapy in 2008 and 2009 - followed by [name]  . Ringworm   . Vitamin D deficiency     Past Surgical History:  Procedure Laterality Date  . CARDIAC CATHETERIZATION N/A 07/23/2015   Procedure: Left Heart Cath and Coronary Angiography;  Surgeon: Belva Crome, MD;  Location: [place] [facility];  Service: Cardiovascular;  Laterality: N/A;  . COLONOSCOPY WITH PROPOFOL N/A 11/24/2015   Procedure: COLONOSCOPY WITH PROPOFOL;  Surgeon: Garlan Fair, MD;  Location: [facility];  Service: Endoscopy;  Laterality: N/A;  . esophageal strictures with dilatations in the past per Dr. Delfin Edis     last '98  . HERNIA REPAIR Left   . LAD stent placement - Dr. Daneen Schick    . left inguinal hernia repair - Dr. Magdalene River    . prostatic radioactive seed implantation - Dr. Valere Dross     '09     Current Outpatient Medications  Medication Sig Dispense Refill  . aspirin 81 MG chewable tablet Chew 81 mg by mouth daily.    Marland Kitchen atorvastatin (LIPITOR) 40 MG tablet Take 40 mg by mouth.    . B Complex Vitamins (B COMPLEX 100) tablet Take 1 tablet by mouth daily.    . benazepril-hydrochlorthiazide (LOTENSIN HCT) 20-12.5 MG tablet Take 1 tablet by mouth daily. 90 tablet 3  . cetirizine (ZYRTEC) 10  MG tablet Take 10 mg by mouth daily.    . Cholecalciferol (VITAMIN D3) 2000 UNITS TABS Take 1 tablet by mouth daily.     . Coenzyme Q10 (CO Q-10) 100 MG CAPS Take 1 capsule by mouth daily.     . Multiple Vitamins-Minerals (CENTRUM) tablet Take 1 tablet by mouth daily.    . niacin (NIASPAN) 500 MG CR tablet Take 1,000 mg by mouth at bedtime.     . nitroGLYCERIN (NITROSTAT) 0.4 MG SL tablet place 1 tablet under the tongue if needed every 5 minutes for chest pain for 3 doses IF NO RELIEF AFTER 3RD DOSE CALL PRESCRIBER OR 911. 25 tablet 1  . Omega-3 Fatty Acids (OMEGA 3 PO) Take 2,400 mg by mouth daily.    Marland Kitchen omeprazole (PRILOSEC) 20 MG capsule Take 20 mg by mouth every other day.     . warfarin  (COUMADIN) 5 MG tablet take 1 tablet by mouth once daily or as directed  0  . metoprolol tartrate (LOPRESSOR) 25 MG tablet Take 1 tablet (25 mg total) by mouth 2 (two) times daily. 180 tablet 3   No current facility-administered medications for this visit.     Allergies:   Patient has no known allergies.    Social History:  The patient  reports that  has never smoked. he has never used smokeless tobacco. He reports that he drinks alcohol. He reports that he does not use drugs.   Family History:  The patient's family history includes CAD in his sister; CVA in his mother; CVA (age of onset: 68) in his father.    ROS:  Please see the history of present illness.   Otherwise, review of systems are positive for none.   All other systems are reviewed and negative.    PHYSICAL EXAM: VS:  BP 110/68   Pulse (!) 57   Ht 6' (1.829 m)   Wt 204 lb 9.6 oz (92.8 kg)   SpO2 97%   BMI 27.75 kg/m  , BMI Body mass index is 27.75 kg/m. GEN: Well nourished, well developed, in no acute distress  HEENT: normal  Neck: no JVD, carotid bruits, or masses Cardiac: RRR; no murmurs, rubs, or gallops,no edema  Respiratory:  clear to auscultation bilaterally, normal work of breathing GI: soft, nontender, nondistended, + BS MS: no deformity or atrophy  Skin: warm and dry, no rash Neuro:  Strength and sensation are intact Psych: euthymic mood, full affect Distal pulses are palpable on both sides.  EKG:  EKG is not ordered today.    Recent Labs: 05/23/2017: BUN 15; Creatinine, Ser 0.95; Hemoglobin 17.0; Platelets 135; Potassium 3.8; Sodium 143    Lipid Panel No results found for: CHOL, TRIG, HDL, CHOLHDL, VLDL, LDLCALC, LDLDIRECT    Wt Readings from Last 3 Encounters:  09/19/17 204 lb 9.6 oz (92.8 kg)  08/23/17 211 lb 9.6 oz (96 kg)  05/23/17 205 lb 1.9 oz (93 kg)       No flowsheet data found.    ASSESSMENT AND PLAN:  1.  Peripheral arterial disease with mild right leg claudication: His  symptoms has resolved completely and he has been able to walk regularly with no limitations.  His distal pulses are palpable.  Continue medical therapy.  He can follow-up with me as needed if there is worsening in his peripheral arterial disease.   2. Coronary artery disease involving native coronary arteries: He has no anginal symptoms.  3. Hyperlipidemia: Currently on atorvastatin.  I reviewed lipid  profile that was done in September 2018 which showed an LDL of 63 and triglyceride of 86.  Both were at target.  4.  Essential hypertension: Blood pressure is controlled on current medications  5.  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  Currently on anticoagulation with warfarin.    Disposition:   FU with me as needed if claudication worsens.   Signed,  Kathlyn Sacramento, MD  09/19/2017 10:23 AM    [facility]

## 2017-09-20 ENCOUNTER — Other Ambulatory Visit: Payer: Self-pay

## 2017-09-20 MED ORDER — BENAZEPRIL-HYDROCHLOROTHIAZIDE 20-12.5 MG PO TABS: 1.0000 | ORAL_TABLET | Freq: Every day | ORAL | 3 refills | Status: DC

## 2017-09-20 MED ORDER — METOPROLOL TARTRATE 25 MG PO TABS: 25.0000 mg | ORAL_TABLET | Freq: Two times a day (BID) | ORAL | 3 refills | Status: DC

## 2018-01-30 ENCOUNTER — Other Ambulatory Visit: Payer: Self-pay | Admitting: Interventional Cardiology

## 2018-08-23 NOTE — Progress Notes (Signed)
Cardiology Office Note:    Date:  08/24/2018   ID:  Ronald Duran, DOB [birth year], MRN [id number]  PCP:  Josetta Huddle, MD  Cardiologist:  Sinclair Grooms, MD   Referring MD: Josetta Huddle, MD   Chief Complaint  Patient presents with  . Coronary Artery Disease  . PAD    History of Present Illness:    Ronald Duran is a 78 y.o. male with a hx of CAD s/p MI with PCI to LAD in 07/1998, h/o ICM with EF 40% by echo 2005, HTN, HLD and prediabetes.Relatively recent cath demonstrated total occlusion of the LAD with plentiful collaterals from the RCA which is a dominant vessel. Also has PAD with predominantly right leg claudication that is improved with a walking program.  Recent CVA, September 2018, acute small left frontal felt to be embolic from LV apex..  Feels well.  Has absolutely no symptoms suggestive of angina.  Walks 45 minutes 4 times per week does other physical activities without chest pain or dyspnea.  With activity, right calf claudication is completely resolved.  He has not needed to use nitroglycerin.  No medication side effects.  He monitors his blood pressure at home and states that his pressures are frequently in the 130 to 831 mmHg systolic range.  He also has diastolics that run in the 85 to 90 mmHg range.  He has had no recurrent neurological symptoms.  No bleeding on Coumadin.   Past Medical History:  Diagnosis Date  . CAD (coronary artery disease)    a. s/p MI with LAD stent placement December, 1999 -last stress test November 2008 with no EKG evidence of ischemia  b. cath 07/23/2015 after abnormal ETT and nuc, occluded mid LAD stent with good distal collateral, medical therapy   . Cardiomyopathy ([place])    Ischemic cardiomyopathy with EF 40% by echo 2005  . Dyslipidemia   . Erectile dysfunction   . Hiatal hernia    with esophageal strictures and dilatations in the past - Dr. Delfin Edis  . History of nuclear stress test    Myoview 12/16: EF 47%,  anterior, anteroseptal and inferoseptal defect suggesting infarct with peri-infarct ischemia, intermediate risk  . HTN (hypertension)   . Hx of seasonal allergies   . Hyperlipidemia   . PAD (peripheral artery disease) ([place])   . Pre-diabetes   . Prostate cancer [facility])    with seed implant therapy following Lupron therapy in 2008 and 2009 - followed by [name]  . Ringworm   . Vitamin D deficiency     Past Surgical History:  Procedure Laterality Date  . CARDIAC CATHETERIZATION N/A 07/23/2015   Procedure: Left Heart Cath and Coronary Angiography;  Surgeon: Belva Crome, MD;  Location: [place] [facility];  Service: Cardiovascular;  Laterality: N/A;  . COLONOSCOPY WITH PROPOFOL N/A 11/24/2015   Procedure: COLONOSCOPY WITH PROPOFOL;  Surgeon: Garlan Fair, MD;  [facility];  Service: Endoscopy;  Laterality: N/A;  . esophageal strictures with dilatations in the past per Dr. Delfin Edis     last '98  . HERNIA REPAIR Left   . LAD stent placement - Dr. Daneen Schick    . left inguinal hernia repair - Dr. Magdalene River    . prostatic radioactive seed implantation - Dr. Valere Dross     '09    Current Medications: Current Meds  Medication Sig  . aspirin 81 MG chewable tablet Chew 81 mg by mouth daily.  Marland Kitchen atorvastatin (LIPITOR)  40 MG tablet Take 40 mg by mouth.  . B Complex Vitamins (B COMPLEX 100) tablet Take 1 tablet by mouth daily.  . benazepril-hydrochlorthiazide (LOTENSIN HCT) 20-12.5 MG tablet Take 1 tablet by mouth daily.  . cetirizine (ZYRTEC) 10 MG tablet Take 10 mg by mouth daily.  . Cholecalciferol (VITAMIN D3) 2000 UNITS TABS Take 1 tablet by mouth daily.   . Coenzyme Q10 (CO Q-10) 100 MG CAPS Take 1 capsule by mouth daily.   . metoprolol tartrate (LOPRESSOR) 25 MG tablet Take 1 tablet (25 mg total) by mouth 2 (two) times daily.  . Multiple Vitamins-Minerals (CENTRUM) tablet Take 1 tablet by mouth daily.  . niacin (NIASPAN) 500 MG CR tablet Take 1,000 mg by mouth at  bedtime.   . nitroGLYCERIN (NITROSTAT) 0.4 MG SL tablet PLACE 1 TABLET UNDER THE TONGUE IF NEEDED EVERY 5 MINUTES FOR CHEST PAIN FOR 3 DOSES IF NO RELIEF AFTER FIRST DOSE CALL PRESCRIBER OR 911.  . Omega-3 Fatty Acids (OMEGA 3 PO) Take 2,400 mg by mouth daily.  Marland Kitchen omeprazole (PRILOSEC) 20 MG capsule Take 20 mg by mouth every other day.   . warfarin (COUMADIN) 5 MG tablet take 1 tablet by mouth once daily or as directed     Allergies:   Patient has no known allergies.   Social History   Socioeconomic History  . Marital status: Married    Spouse name: Not on file  . Number of children: Not on file  . Years of education: Not on file  . Highest education level: Not on file  Occupational History  . Not on file  Social Needs  . Financial resource strain: Not on file  . Food insecurity:    Worry: Not on file    Inability: Not on file  . Transportation needs:    Medical: Not on file    Non-medical: Not on file  Tobacco Use  . Smoking status: Never Smoker  . Smokeless tobacco: Never Used  Substance and Sexual Activity  . Alcohol use: Yes    Alcohol/week: 0.0 standard drinks    Comment: occasionally for special occasions    . Drug use: No  . Sexual activity: Not on file  Lifestyle  . Physical activity:    Days per week: Not on file    Minutes per session: Not on file  . Stress: Not on file  Relationships  . Social connections:    Talks on phone: Not on file    Gets together: Not on file    Attends religious service: Not on file    Active member of club or organization: Not on file    Attends meetings of clubs or organizations: Not on file    Relationship status: Not on file  Other Topics Concern  . Not on file  Social History Narrative  . Not on file     Family History: The patient's family history includes CAD in his sister; CVA in his mother; CVA (age of onset: 67) in his father.  ROS:   Please see the history of present illness.    No new data or complaints other  than as mentioned above.  All other systems reviewed and are negative.  EKGs/Labs/Other Studies Reviewed:    The following studies were reviewed today: No new functional or imaging data  EKG:  EKG reveals sinus rhythm, isolated PVCs, QS pattern V1 through V3.  When compared to the most recent prior tracing from January 2019, the PVC's are new.  Recent Labs: No results found for requested labs within last 8760 hours.  Recent Lipid Panel No results found for: CHOL, TRIG, HDL, CHOLHDL, VLDL, LDLCALC, LDLDIRECT  Physical Exam:    VS:  BP (!) 150/88   Pulse (!) 59   Ht 6' (1.829 m)   Wt 204 lb (92.5 kg)   SpO2 95%   BMI 27.67 kg/m     Wt Readings from Last 3 Encounters:  08/24/18 204 lb (92.5 kg)  09/19/17 204 lb 9.6 oz (92.8 kg)  08/23/17 211 lb 9.6 oz (96 kg)     GEN: Healthy appearing younger than stated age. No acute distress HEENT: Normal NECK: No JVD. LYMPHATICS: No lymphadenopathy CARDIAC: RRR.  No murmur, no gallop, no edema VASCULAR: 2+ radial and carotid bilateral pulses, absent carotid bruits RESPIRATORY:  Clear to auscultation without rales, wheezing or rhonchi  ABDOMEN: Soft, non-tender, non-distended, No pulsatile mass, MUSCULOSKELETAL: No deformity  SKIN: Warm and dry NEUROLOGIC:  Alert and oriented x 3 PSYCHIATRIC:  Normal affect   ASSESSMENT:    1. Coronary artery disease of native artery of native heart with stable angina pectoris ([place])   2. Hyperlipidemia, unspecified hyperlipidemia type   3. Essential hypertension   4. PAD (peripheral artery disease) ([place])   5. Cerebrovascular accident (CVA) due to embolism of other cerebral artery ([place])   6. Chronic combined systolic and diastolic heart failure, NYHA class 1 ([place])   7. Chronic anticoagulation    PLAN:    In order of problems listed above:  1. Stable coronary disease.  Long discussion concerning secondary risk prevention.  See below. 2. LDL is targeted less than 70.  Most recent data identified  an LDL of 63.  Triglyceride was normal at 113.  Could consider icosapent to layer additional risk reduction. 3. Blood pressure is higher than it should be.  Add amlodipine 2.5 mg/day and monitor at home.  Send [place] results of blood pressures that are taken between 2 and 4 hours after a.m. medications. 4. Continue aerobic activity 5. Secondary risk prevention is discussed as per coronary disease discussion above and below. 6. No evidence of volume overload or decompensated heart failure. 7. No bleeding on anticoagulation which is indefinite lifetime related to possible embolic CVA due to apical thrombus.  Overall education and awareness concerning primary/secondary risk prevention was discussed in detail: LDL less than 70, hemoglobin A1c less than 7, blood pressure target less than 130/80 mmHg, >150 minutes of moderate aerobic activity per week, avoidance of smoking, weight control (via diet and exercise), and continued surveillance/management of/for obstructive sleep apnea.  Clinical follow-up in 1 year unless we have difficulty controlling the blood pressure.     Medication Adjustments/Labs and Tests Ordered: Current medicines are reviewed at length with the patient today.  Concerns regarding medicines are outlined above.  Orders Placed This Encounter  Procedures  . EKG 12-Lead   Meds ordered this encounter  Medications  . amLODipine (NORVASC) 2.5 MG tablet    Sig: Take 1 tablet (2.5 mg total) by mouth daily.    Dispense:  90 tablet    Refill:  3    Patient Instructions  Medication Instructions:  1) START Amlodipine 2.5mg  once daily   If you need a refill on your cardiac medications before your next appointment, please call your pharmacy.   Lab work: None If you have labs (blood work) drawn today and your tests are completely normal, you will receive your results only by: Marland Kitchen MyChart Message (if  you have MyChart) OR . A paper copy in the mail If you have any lab test that is  abnormal or we need to change your treatment, we will call you to review the results.  Testing/Procedures: None  Follow-Up: At [facility], you and your health needs are our priority.  As part of our continuing mission to provide you with exceptional heart care, we have created designated Provider Care Teams.  These Care Teams include your primary Cardiologist (physician) and Advanced Practice Providers (APPs -  Physician Assistants and Nurse Practitioners) who all work together to provide you with the care you need, when you need it. You will need a follow up appointment in 12 months.  Please call our office 2 months in advance to schedule this appointment.  You may see Sinclair Grooms, MD or one of the following Advanced Practice Providers on your designated Care Team:   Truitt Merle, NP Cecilie Kicks, NP . Kathyrn Drown, NP  Any Other Special Instructions Will Be Listed Below (If Applicable).  Please monitor your blood pressure 2-4 hours after the Amlodipine.  Contact the office if it is consistently higher than 130/80.      Signed, Sinclair Grooms, MD  08/24/2018 10:03 AM    [place]

## 2018-08-24 ENCOUNTER — Ambulatory Visit (INDEPENDENT_AMBULATORY_CARE_PROVIDER_SITE_OTHER): Payer: Medicare Other | Admitting: Interventional Cardiology

## 2018-08-24 ENCOUNTER — Encounter: Payer: Self-pay | Admitting: Interventional Cardiology

## 2018-08-24 VITALS — BP 150/88 | HR 59 | Ht 72.0 in | Wt 204.0 lb

## 2018-08-24 DIAGNOSIS — Z7901 Long term (current) use of anticoagulants: Secondary | ICD-10-CM

## 2018-08-24 DIAGNOSIS — E785 Hyperlipidemia, unspecified: Secondary | ICD-10-CM | POA: Diagnosis not present

## 2018-08-24 DIAGNOSIS — I739 Peripheral vascular disease, unspecified: Secondary | ICD-10-CM | POA: Diagnosis not present

## 2018-08-24 DIAGNOSIS — I6349 Cerebral infarction due to embolism of other cerebral artery: Secondary | ICD-10-CM

## 2018-08-24 DIAGNOSIS — I10 Essential (primary) hypertension: Secondary | ICD-10-CM

## 2018-08-24 DIAGNOSIS — I5042 Chronic combined systolic (congestive) and diastolic (congestive) heart failure: Secondary | ICD-10-CM

## 2018-08-24 DIAGNOSIS — I25118 Atherosclerotic heart disease of native coronary artery with other forms of angina pectoris: Secondary | ICD-10-CM | POA: Diagnosis not present

## 2018-08-24 MED ORDER — AMLODIPINE BESYLATE 2.5 MG PO TABS: 2.5000 mg | ORAL_TABLET | Freq: Every day | ORAL | 3 refills | Status: DC

## 2018-08-24 NOTE — Patient Instructions (Signed)
Medication Instructions:  1) START Amlodipine 2.5mg  once daily   If you need a refill on your cardiac medications before your next appointment, please call your pharmacy.   Lab work: None If you have labs (blood work) drawn today and your tests are completely normal, you will receive your results only by: Marland Kitchen MyChart Message (if you have MyChart) OR . A paper copy in the mail If you have any lab test that is abnormal or we need to change your treatment, we will call you to review the results.  Testing/Procedures: None  Follow-Up: At [facility], you and your health needs are our priority.  As part of our continuing mission to provide you with exceptional heart care, we have created designated Provider Care Teams.  These Care Teams include your primary Cardiologist (physician) and Advanced Practice Providers (APPs -  Physician Assistants and Nurse Practitioners) who all work together to provide you with the care you need, when you need it. You will need a follow up appointment in 12 months.  Please call our office 2 months in advance to schedule this appointment.  You may see Sinclair Grooms, MD or one of the following Advanced Practice Providers on your designated Care Team:   Truitt Merle, NP Cecilie Kicks, NP . Kathyrn Drown, NP  Any Other Special Instructions Will Be Listed Below (If Applicable).  Please monitor your blood pressure 2-4 hours after the Amlodipine.  Contact the office if it is consistently higher than 130/80.

## 2018-09-18 ENCOUNTER — Other Ambulatory Visit: Payer: Self-pay | Admitting: Interventional Cardiology

## 2018-09-18 MED ORDER — BENAZEPRIL-HYDROCHLOROTHIAZIDE 20-12.5 MG PO TABS: 1.0000 | ORAL_TABLET | Freq: Every day | ORAL | 3 refills | Status: DC

## 2018-09-25 ENCOUNTER — Other Ambulatory Visit: Payer: Self-pay | Admitting: *Deleted

## 2018-09-25 MED ORDER — METOPROLOL TARTRATE 25 MG PO TABS: 25.0000 mg | ORAL_TABLET | Freq: Two times a day (BID) | ORAL | 3 refills | Status: DC

## 2019-07-24 ENCOUNTER — Other Ambulatory Visit: Payer: Self-pay

## 2019-07-24 MED ORDER — AMLODIPINE BESYLATE 2.5 MG PO TABS: 2.5000 mg | ORAL_TABLET | Freq: Every day | ORAL | 0 refills | Status: DC

## 2019-08-09 DIAGNOSIS — U071 COVID-19: Secondary | ICD-10-CM

## 2019-08-09 HISTORY — DX: COVID-19: U07.1

## 2019-08-23 ENCOUNTER — Ambulatory Visit: Payer: Medicare Other | Attending: Internal Medicine

## 2019-08-23 DIAGNOSIS — Z23 Encounter for immunization: Secondary | ICD-10-CM | POA: Insufficient documentation

## 2019-08-23 NOTE — Progress Notes (Signed)
   Covid-19 Vaccination Clinic  Name:  Ronald Duran    MRN: [id number] DOB: [birth year]  08/23/2019  Mr. Konow was observed post Covid-19 immunization for 15 minutes without incidence. He was provided with Vaccine Information Sheet and instruction to access the V-Safe system.   Mr. Mcgrue was instructed to call 911 with any severe reactions post vaccine: Marland Kitchen Difficulty breathing  . Swelling of your face and throat  . A fast heartbeat  . A bad rash all over your body  . Dizziness and weakness    Immunizations Administered    Name Date Dose VIS Date Route   Pfizer COVID-19 Vaccine 08/23/2019  9:28 AM 0.3 mL 07/19/2019 Intramuscular   Manufacturer: [employer], [employer]   Lot: [id number]   [place]: [id number]

## 2019-09-02 NOTE — Progress Notes (Signed)
Cardiology Office Note:    Date:  09/03/2019   ID:  Ronald Duran, DOB [birth year], MRN [id number]  PCP:  Josetta Huddle, MD  Cardiologist:  Sinclair Grooms, MD   Referring MD: Josetta Huddle, MD   Chief Complaint  Patient presents with  . Coronary Artery Disease  . Advice Only    History of Present Illness:    Ronald Duran is a 79 y.o. male with a hx of CAD s/p MI with PCI to LAD in 07/1998, h/o ICM with EF 40% by echo 2005, HTN, HLD and prediabetes.Relatively recent cath demonstrated total occlusion of the LAD with plentiful collaterals from the RCA which is a dominant vessel. Also has PAD with predominantly right leg claudication that is improved with a walking program.Recent CVA, September 77, acute small left frontal felt to be embolic from LV apex..  There are no specific cardiac complaints.  Exertional tolerance has not changed in the past 12 months.  Denies orthopnea, PND, exertional chest pain, and peripheral edema.  He is compliant with his medical regimen.  No neurological complaints.  Past Medical History:  Diagnosis Date  . CAD (coronary artery disease)    a. s/p MI with LAD stent placement December, 1999 -last stress test November 2008 with no EKG evidence of ischemia  b. cath 07/23/2015 after abnormal ETT and nuc, occluded mid LAD stent with good distal collateral, medical therapy   . Cardiomyopathy ([place])    Ischemic cardiomyopathy with EF 40% by echo 2005  . Dyslipidemia   . Erectile dysfunction   . Hiatal hernia    with esophageal strictures and dilatations in the past - Dr. Delfin Edis  . History of nuclear stress test    Myoview 12/16: EF 47%, anterior, anteroseptal and inferoseptal defect suggesting infarct with peri-infarct ischemia, intermediate risk  . HTN (hypertension)   . Hx of seasonal allergies   . Hyperlipidemia   . PAD (peripheral artery disease) ([place])   . Pre-diabetes   . Prostate cancer [facility])    with seed implant therapy following  Lupron therapy in 2008 and 2009 - followed by [name]  . Ringworm   . Vitamin D deficiency     Past Surgical History:  Procedure Laterality Date  . CARDIAC CATHETERIZATION N/A 07/23/2015   Procedure: Left Heart Cath and Coronary Angiography;  Surgeon: Belva Crome, MD;  Location: [place] [facility];  Service: Cardiovascular;  Laterality: N/A;  . COLONOSCOPY WITH PROPOFOL N/A 11/24/2015   Procedure: COLONOSCOPY WITH PROPOFOL;  Surgeon: Garlan Fair, MD;  Location: [facility];  Service: Endoscopy;  Laterality: N/A;  . esophageal strictures with dilatations in the past per Dr. Delfin Edis     last '98  . HERNIA REPAIR Left   . LAD stent placement - Dr. Daneen Schick    . left inguinal hernia repair - Dr. Magdalene River    . prostatic radioactive seed implantation - Dr. Valere Dross     '09    Current Medications: Current Meds  Medication Sig  . aspirin 81 MG chewable tablet Chew 81 mg by mouth daily.  Marland Kitchen atorvastatin (LIPITOR) 40 MG tablet Take 40 mg by mouth.  . B Complex Vitamins (B COMPLEX 100) tablet Take 1 tablet by mouth daily.  . benazepril-hydrochlorthiazide (LOTENSIN HCT) 20-12.5 MG tablet Take 1 tablet by mouth daily.  . cetirizine (ZYRTEC) 10 MG tablet Take 10 mg by mouth daily.  . Cholecalciferol (VITAMIN D3) 2000 UNITS TABS Take 1 tablet by  mouth daily.   . Coenzyme Q10 (CO Q-10) 100 MG CAPS Take 1 capsule by mouth daily.   . metoprolol tartrate (LOPRESSOR) 25 MG tablet Take 1 tablet (25 mg total) by mouth 2 (two) times daily.  . Multiple Vitamins-Minerals (CENTRUM) tablet Take 1 tablet by mouth daily.  . niacin (NIASPAN) 500 MG CR tablet Take 1,000 mg by mouth at bedtime.   . nitroGLYCERIN (NITROSTAT) 0.4 MG SL tablet PLACE 1 TABLET UNDER THE TONGUE IF NEEDED EVERY 5 MINUTES FOR CHEST PAIN FOR 3 DOSES IF NO RELIEF AFTER FIRST DOSE CALL PRESCRIBER OR 911.  . Omega-3 Fatty Acids (OMEGA 3 PO) Take 2,400 mg by mouth daily.  Marland Kitchen omeprazole (PRILOSEC) 20 MG capsule Take 20 mg by  mouth every other day.   . warfarin (COUMADIN) 5 MG tablet take 1 tablet by mouth once daily or as directed  . [DISCONTINUED] amLODipine (NORVASC) 2.5 MG tablet Take 1 tablet (2.5 mg total) by mouth daily.  . [DISCONTINUED] nitroGLYCERIN (NITROSTAT) 0.4 MG SL tablet PLACE 1 TABLET UNDER THE TONGUE IF NEEDED EVERY 5 MINUTES FOR CHEST PAIN FOR 3 DOSES IF NO RELIEF AFTER FIRST DOSE CALL PRESCRIBER OR 911.     Allergies:   Patient has no known allergies.   Social History   Socioeconomic History  . Marital status: Married    Spouse name: Not on file  . Number of children: Not on file  . Years of education: Not on file  . Highest education level: Not on file  Occupational History  . Not on file  Tobacco Use  . Smoking status: Never Smoker  . Smokeless tobacco: Never Used  Substance and Sexual Activity  . Alcohol use: Yes    Alcohol/week: 0.0 standard drinks    Comment: occasionally for special occasions    . Drug use: No  . Sexual activity: Not on file  Other Topics Concern  . Not on file  Social History Narrative  . Not on file   Social Determinants of Health   Financial Resource Strain:   . Difficulty of Paying Living Expenses: Not on file  Food Insecurity:   . Worried About Charity fundraiser in the Last Year: Not on file  . Ran Out of Food in the Last Year: Not on file  Transportation Needs:   . Lack of Transportation (Medical): Not on file  . Lack of Transportation (Non-Medical): Not on file  Physical Activity:   . Days of Exercise per Week: Not on file  . Minutes of Exercise per Session: Not on file  Stress:   . Feeling of Stress : Not on file  Social Connections:   . Frequency of Communication with Friends and Family: Not on file  . Frequency of Social Gatherings with Friends and Family: Not on file  . Attends Religious Services: Not on file  . Active Member of Clubs or Organizations: Not on file  . Attends Archivist Meetings: Not on file  . Marital  Status: Not on file     Family History: The patient's family history includes CAD in his sister; CVA in his mother; CVA (age of onset: 106) in his father.  ROS:   Please see the history of present illness.    Has concerns about the COVID-19 vaccine, recent prescription of prednisone, and griseofulvin interaction with warfarin.  I referred him back to his primary care physician, Dr. Mertha Finders.  All other systems reviewed and are negative.  EKGs/Labs/Other Studies Reviewed:  The following studies were reviewed today: No new data  EKG:  EKG sinus bradycardia at 49 bpm with sagging inferior and anterolateral ST segments.  QS pattern V1 through V4.  Vertical axis.  When compared to August 23, 2017, no significant changes noted.  Recent Labs: No results found for requested labs within last 8760 hours.  Recent Lipid Panel No results found for: CHOL, TRIG, HDL, CHOLHDL, VLDL, LDLCALC, LDLDIRECT  Physical Exam:    VS:  BP (!) 164/100   Pulse (!) 49   Ht 6' (1.829 m)   Wt 194 lb (88 kg)   SpO2 98%   BMI 26.31 kg/m     Wt Readings from Last 3 Encounters:  09/03/19 194 lb (88 kg)  08/24/18 204 lb (92.5 kg)  09/19/17 204 lb 9.6 oz (92.8 kg)     GEN: Healthy-appearing. No acute distress HEENT: Normal NECK: No JVD. LYMPHATICS: No lymphadenopathy CARDIAC:  RRR without murmur, gallop, or edema. VASCULAR:  Normal Pulses. No bruits. RESPIRATORY:  Clear to auscultation without rales, wheezing or rhonchi  ABDOMEN: Soft, non-tender, non-distended, No pulsatile mass, MUSCULOSKELETAL: No deformity  SKIN: Warm and dry NEUROLOGIC:  Alert and oriented x 3 PSYCHIATRIC:  Normal affect   ASSESSMENT:    1. Coronary artery disease of native artery of native heart with stable angina pectoris ([place])   2. Hyperlipidemia, unspecified hyperlipidemia type   3. Essential hypertension   4. PAD (peripheral artery disease) ([place])   5. Chronic combined systolic and diastolic heart failure, NYHA  class 1 ([place])   6. Chronic anticoagulation   7. 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 Educated about COVID-19 virus infection    PLAN:    In order of problems listed above:  1. He denies angina.  Secondary prevention discussed 2. LDL is at target 3. Blood pressure is elevated.  Amlodipine is increased to 5 mg/day.  Target is 130/80 mmHg.  He will call with follow-up blood pressures over the next 7 days. 4. Asymptomatic 5. No evidence of volume overload.  Probably needs a echocardiogram at the next office visit. 6. Coumadin therapy without complications.  Currently on griseofulvin and prednisone prescribed by dermatology.  INR is followed at [facility].  I asked that he speak with his primary physician today concerning interaction and adjustments. 7. No new symptoms. 8. 3W's and COVID-19 vaccine prior advocated and accepted.  Overall education and awareness concerning primary/secondary risk prevention was discussed in detail: LDL less than 70, hemoglobin A1c less than 7, blood pressure target less than 130/80 mmHg, >150 minutes of moderate aerobic activity per week, avoidance of smoking, weight control (via diet and exercise), and continued surveillance/management of/for obstructive sleep apnea.    Medication Adjustments/Labs and Tests Ordered: Current medicines are reviewed at length with the patient today.  Concerns regarding medicines are outlined above.  Orders Placed This Encounter  Procedures  . EKG 12-Lead   Meds ordered this encounter  Medications  . nitroGLYCERIN (NITROSTAT) 0.4 MG SL tablet    Sig: PLACE 1 TABLET UNDER THE TONGUE IF NEEDED EVERY 5 MINUTES FOR CHEST PAIN FOR 3 DOSES IF NO RELIEF AFTER FIRST DOSE CALL PRESCRIBER OR 911.    Dispense:  25 tablet    Refill:  4  . amLODipine (NORVASC) 5 MG tablet    Sig: Take 1 tablet (5 mg total) by mouth daily.    Dispense:  90 tablet    Refill:  3  Dose change    Patient  Instructions  Medication Instructions:  1) INCREASE Amlodipine to 5mg  once daily.  Monitor your blood pressure daily and call us or send a MyChart message with those readings in two weeks.  Make sure you are taking your blood pressure at least two hours after your Amlodipine.   *If you need a refill on your cardiac medications before your next appointment, please call your pharmacy*  Lab Work: None If you have labs (blood work) drawn today and your tests are completely normal, you will receive your results only by: Marland Kitchen MyChart Message (if you have MyChart) OR . A paper copy in the mail If you have any lab test that is abnormal or we need to change your treatment, we will call you to review the results.  Testing/Procedures: None  Follow-Up: At [facility], you and your health needs are our priority.  As part of our continuing mission to provide you with exceptional heart care, we have created designated Provider Care Teams.  These Care Teams include your primary Cardiologist (physician) and Advanced Practice Providers (APPs -  Physician Assistants and Nurse Practitioners) who all work together to provide you with the care you need, when you need it.  Your next appointment:   12 month(s)  The format for your next appointment:   In Person  Provider:   You may see Sinclair Grooms, MD or one of the following Advanced Practice Providers on your designated Care Team:    Truitt Merle, NP  Cecilie Kicks, NP  Kathyrn Drown, NP   Other Instructions      Signed, Sinclair Grooms, MD  09/03/2019 10:06 AM    [place]

## 2019-09-03 ENCOUNTER — Ambulatory Visit (INDEPENDENT_AMBULATORY_CARE_PROVIDER_SITE_OTHER): Payer: Medicare Other | Admitting: Interventional Cardiology

## 2019-09-03 ENCOUNTER — Other Ambulatory Visit: Payer: Self-pay

## 2019-09-03 ENCOUNTER — Encounter: Payer: Self-pay | Admitting: Interventional Cardiology

## 2019-09-03 VITALS — BP 164/100 | HR 49 | Ht 72.0 in | Wt 194.0 lb

## 2019-09-03 DIAGNOSIS — E785 Hyperlipidemia, unspecified: Secondary | ICD-10-CM | POA: Diagnosis not present

## 2019-09-03 DIAGNOSIS — I739 Peripheral vascular disease, unspecified: Secondary | ICD-10-CM | POA: Diagnosis not present

## 2019-09-03 DIAGNOSIS — Z7189 Other specified counseling: Secondary | ICD-10-CM

## 2019-09-03 DIAGNOSIS — I10 Essential (primary) hypertension: Secondary | ICD-10-CM

## 2019-09-03 DIAGNOSIS — I25118 Atherosclerotic heart disease of native coronary artery with other forms of angina pectoris: Secondary | ICD-10-CM | POA: Diagnosis not present

## 2019-09-03 DIAGNOSIS — I5042 Chronic combined systolic (congestive) and diastolic (congestive) heart failure: Secondary | ICD-10-CM

## 2019-09-03 DIAGNOSIS — Z7901 Long term (current) use of anticoagulants: Secondary | ICD-10-CM

## 2019-09-03 DIAGNOSIS — I6349 Cerebral infarction due to embolism of other cerebral artery: Secondary | ICD-10-CM

## 2019-09-03 MED ORDER — AMLODIPINE BESYLATE 5 MG PO TABS: 5.0000 mg | ORAL_TABLET | Freq: Every day | ORAL | 3 refills | Status: DC

## 2019-09-03 MED ORDER — NITROGLYCERIN 0.4 MG SL SUBL: SUBLINGUAL_TABLET | SUBLINGUAL | 4 refills | Status: DC

## 2019-09-03 NOTE — Patient Instructions (Signed)
Medication Instructions:  1) INCREASE Amlodipine to 5mg  once daily.  Monitor your blood pressure daily and call us or send a MyChart message with those readings in two weeks.  Make sure you are taking your blood pressure at least two hours after your Amlodipine.   *If you need a refill on your cardiac medications before your next appointment, please call your pharmacy*  Lab Work: None If you have labs (blood work) drawn today and your tests are completely normal, you will receive your results only by: Marland Kitchen MyChart Message (if you have MyChart) OR . A paper copy in the mail If you have any lab test that is abnormal or we need to change your treatment, we will call you to review the results.  Testing/Procedures: None  Follow-Up: At [facility], you and your health needs are our priority.  As part of our continuing mission to provide you with exceptional heart care, we have created designated Provider Care Teams.  These Care Teams include your primary Cardiologist (physician) and Advanced Practice Providers (APPs -  Physician Assistants and Nurse Practitioners) who all work together to provide you with the care you need, when you need it.  Your next appointment:   12 month(s)  The format for your next appointment:   In Person  Provider:   You may see Sinclair Grooms, MD or one of the following Advanced Practice Providers on your designated Care Team:    Truitt Merle, NP  Cecilie Kicks, NP  Kathyrn Drown, NP   Other Instructions

## 2019-09-05 ENCOUNTER — Other Ambulatory Visit: Payer: Self-pay | Admitting: Interventional Cardiology

## 2019-09-05 MED ORDER — BENAZEPRIL-HYDROCHLOROTHIAZIDE 20-12.5 MG PO TABS: 1.0000 | ORAL_TABLET | Freq: Every day | ORAL | 3 refills | Status: DC

## 2019-09-09 ENCOUNTER — Other Ambulatory Visit: Payer: Self-pay | Admitting: Interventional Cardiology

## 2019-09-09 MED ORDER — METOPROLOL TARTRATE 25 MG PO TABS: 25.0000 mg | ORAL_TABLET | Freq: Two times a day (BID) | ORAL | 3 refills | Status: DC

## 2019-09-11 ENCOUNTER — Ambulatory Visit: Payer: Medicare Other | Attending: Internal Medicine

## 2019-09-11 DIAGNOSIS — Z23 Encounter for immunization: Secondary | ICD-10-CM | POA: Insufficient documentation

## 2019-09-11 NOTE — Progress Notes (Signed)
   Covid-19 Vaccination Clinic  Name:  Ronald Duran    MRN: [id number] DOB: 05/25/1941  09/11/2019  [name] was observed post Covid-19 immunization for 15 minutes without incidence. He was provided with Vaccine Information Sheet and instruction to access the V-Safe system.   [name] was instructed to call 911 with any severe reactions post vaccine: Marland Kitchen Difficulty breathing  . Swelling of your face and throat  . A fast heartbeat  . A bad rash all over your body  . Dizziness and weakness    Immunizations Administered    Name Date Dose VIS Date Route   Pfizer COVID-19 Vaccine 09/11/2019 10:11 AM 0.3 mL 07/19/2019 Intramuscular   Manufacturer: [place]   Lot: [id number]   [place]: [id number]

## 2020-09-07 NOTE — Progress Notes (Signed)
Cardiology Office Note:    Date:  09/09/2020   ID:  Ronald Duran, DOB 1940-12-11, MRN [id number]  PCP:  Ronald Huddle, MD  Cardiologist:  Ronald Grooms, MD   Referring MD: Ronald Huddle, MD   Chief Complaint  Patient presents with  . Congestive Heart Failure  . Coronary Artery Disease  . Claudication    History of Present Illness:    Ronald Duran is a 80 y.o. male with a hx of CAD s/p MI with PCI to LAD in 07/1998, h/o ICM with EF 40% by echo 2005, HTN, HLD and prediabetes, occluded LAD with collaterals from RCA, PAD with predominantly right leg claudication, coumadin therapy, and CVA September 2018 small left frontal felt to be embolic from LV apex.  He is physically active.  He plays golf.  Stopped playing tennis because he was concerned about stumbling and having head injury on Coumadin.  Claudication is resolved.  He goes on a 1 hour walk at least 3 times per week.  He has not had chest discomfort.  Nitroglycerin is expired.  No shortness of breath, edema, palpitations, or other cardiac complaints.  Past Medical History:  Diagnosis Date  . CAD (coronary artery disease)    a. s/p MI with LAD stent placement December, 1999 -last stress test November 2008 with no EKG evidence of ischemia  b. cath 07/23/2015 after abnormal ETT and nuc, occluded mid LAD stent with good distal collateral, medical therapy   . Cardiomyopathy ([place])    Ischemic cardiomyopathy with EF 40% by echo 2005  . Dyslipidemia   . Erectile dysfunction   . Hiatal hernia    with esophageal strictures and dilatations in the past - Dr. Delfin Duran  . History of nuclear stress test    Myoview 12/16: EF 47%, anterior, anteroseptal and inferoseptal defect suggesting infarct with peri-infarct ischemia, intermediate risk  . HTN (hypertension)   . Hx of seasonal allergies   . Hyperlipidemia   . PAD (peripheral artery disease) ([place])   . Pre-diabetes   . Prostate cancer [facility])    with seed implant  therapy following Lupron therapy in 2008 and 2009 - followed by Ronald Duran  . Ringworm   . Vitamin D deficiency     Past Surgical History:  Procedure Laterality Date  . CARDIAC CATHETERIZATION N/A 07/23/2015   Procedure: Left Heart Cath and Coronary Angiography;  Surgeon: Ronald Crome, MD;  Location: [place] [facility];  Service: Cardiovascular;  Laterality: N/A;  . COLONOSCOPY WITH PROPOFOL N/A 11/24/2015   Procedure: COLONOSCOPY WITH PROPOFOL;  Surgeon: Ronald Fair, MD;  [facility];  Service: Endoscopy;  Laterality: N/A;  . esophageal strictures with dilatations in the past per Dr. Delfin Duran     last '98  . HERNIA REPAIR Left   . LAD stent placement - Ronald Duran    . left inguinal hernia repair - Ronald Duran    . prostatic radioactive seed implantation - Ronald Duran     '09    Current Medications: Current Meds  Medication Sig  . aspirin 81 MG chewable tablet Chew 81 mg by mouth daily.  Marland Kitchen atorvastatin (LIPITOR) 40 MG tablet Take 40 mg by mouth.  . B Complex Vitamins (B COMPLEX 100) tablet Take 1 tablet by mouth daily.  . cetirizine (ZYRTEC) 10 MG tablet Take 10 mg by mouth daily.  . Cholecalciferol (VITAMIN D3) 2000 UNITS TABS Take 1 tablet by mouth daily.   Marland Kitchen  Coenzyme Q10 (CO Q-10) 100 MG CAPS Take 1 capsule by mouth daily.   . Multiple Vitamins-Minerals (CENTRUM) tablet Take 1 tablet by mouth daily.  . niacin (NIASPAN) 500 MG CR tablet Take 1,000 mg by mouth at bedtime.   . Omega-3 Fatty Acids (OMEGA 3 PO) Take 2,400 mg by mouth daily.  Marland Kitchen warfarin (COUMADIN) 5 MG tablet take 1 tablet by mouth once daily or as directed  . [DISCONTINUED] amLODipine (NORVASC) 5 MG tablet Take 1 tablet (5 mg total) by mouth daily.  . [DISCONTINUED] benazepril-hydrochlorthiazide (LOTENSIN HCT) 20-12.5 MG tablet Take 1 tablet by mouth daily.  . [DISCONTINUED] metoprolol tartrate (LOPRESSOR) 25 MG tablet Take 1 tablet (25 mg total) by mouth 2 (two) times daily.  .  [DISCONTINUED] nitroGLYCERIN (NITROSTAT) 0.4 MG SL tablet PLACE 1 TABLET UNDER THE TONGUE IF NEEDED EVERY 5 MINUTES FOR CHEST PAIN FOR 3 DOSES IF NO RELIEF AFTER FIRST DOSE CALL PRESCRIBER OR 911.     Allergies:   Patient has no known allergies.   Social History   Socioeconomic History  . Marital status: Married    Spouse name: Not on file  . Number of children: Not on file  . Years of education: Not on file  . Highest education level: Not on file  Occupational History  . Not on file  Tobacco Use  . Smoking status: Never Smoker  . Smokeless tobacco: Never Used  Vaping Use  . Vaping Use: Never used  Substance and Sexual Activity  . Alcohol use: Yes    Alcohol/week: 0.0 standard drinks    Comment: occasionally for special occasions    . Drug use: No  . Sexual activity: Not on file  Other Topics Concern  . Not on file  Social History Narrative  . Not on file   Social Determinants of Health   Financial Resource Strain: Not on file  Food Insecurity: Not on file  Transportation Needs: Not on file  Physical Activity: Not on file  Stress: Not on file  Social Connections: Not on file     Family History: The patient's family history includes CAD in his sister; CVA in his mother; CVA (age of onset: 33) in his father.  ROS:   Please see the history of present illness.    No major complaints.  He is staying active.  No instances of Covid infection.  All other systems reviewed and are negative.  EKGs/Labs/Other Studies Reviewed:    The following studies were reviewed today:  2D Doppler echocardiogram 2018: IMPRESSIONS  Normal global left ventricular size with reduced systolic function. Akinetic apex. Appears to be thrombus in apex, measuring  approximately 1.4 cm by 1.4 cm. Mild left ventricular hypertrophy. Mildly abnormal left ventricular ejection fraction estimated at  40-45%. Septal E/E' ratio is 11.1 indicating normal filling pressure. Abnormal relaxation filling  pattern of the left ventricle for age  (stage 1 diastolic dysfunction). No previous echo for comparison.   An interatrial shunt was not identified with and without Valsalva.  Left atrial dilatation.   Mitral annular calcification. Mild thickening/calcification of the mitral valve leaflets. Trace to mild mitral regurgitation.  Aortic valve appears to be sclerotic without evidence of stenosis. No significant aortic regurgitation noted.  Trace tricuspid regurgitation. Unable to estimate PASP.    EKG:  EKG sinus bradycardia, anterior QS pattern V1 through V4.  PACs.  Recent Labs: No results found for requested labs within last 8760 hours.  Recent Lipid Panel No results found for: CHOL, TRIG,  HDL, CHOLHDL, VLDL, LDLCALC, LDLDIRECT  Physical Exam:    VS:  BP 136/72   Pulse (!) 48   Ht 6' (1.829 m)   Wt 192 lb 9.6 oz (87.4 kg)   SpO2 99%   BMI 26.12 kg/m     Wt Readings from Last 3 Encounters:  09/09/20 192 lb 9.6 oz (87.4 kg)  09/03/19 194 lb (88 kg)  08/24/18 204 lb (92.5 kg)     GEN: Healthy-appearing. No acute distress HEENT: Normal NECK: No JVD. LYMPHATICS: No lymphadenopathy CARDIAC: No murmur. RRR no gallop, or edema. VASCULAR:  Normal Pulses. No bruits. RESPIRATORY:  Clear to auscultation without rales, wheezing or rhonchi  ABDOMEN: Soft, non-tender, non-distended, No pulsatile mass, MUSCULOSKELETAL: No deformity  SKIN: Warm and dry NEUROLOGIC:  Alert and oriented x 3 PSYCHIATRIC:  Normal affect   ASSESSMENT:    1. 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 Hyperlipidemia, unspecified hyperlipidemia type   3. Essential hypertension   4. PAD (peripheral artery disease) ([place])   5. Chronic combined systolic and diastolic heart failure, NYHA class 1 ([place])   6. Chronic anticoagulation   7. 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 Educated about COVID-19 virus  infection    PLAN:    In order of problems listed above:  1. Secondary prevention discussed.  He is doing well in all regards. 2. LDL is below target.  Continue current intensity statin. 3. Excellent blood pressure control on Lopressor, Lotensin HCTZ, and amlodipine. 4. No discomfort in over 2 years since he started a walking program.  Continue secondary prevention. 5. No heart failure symptoms.  He is on a low grade heart failure regimen.  He has mildly decreased LV systolic function when last evaluated in 2016.  Also had an echocardiogram 6. Continue Coumadin. 7. No recurrent neurological symptoms. 8. Vaccinated, boosted, and practicing social distancing.  Overall education and awareness concerning primary/secondary risk prevention was discussed in detail: LDL less than 70, hemoglobin A1c less than 7, blood pressure target less than 130/80 mmHg, >150 minutes of moderate aerobic activity per week, avoidance of smoking, weight control (via diet and exercise), and continued surveillance/management of/for obstructive sleep apnea.    Medication Adjustments/Labs and Tests Ordered: Current medicines are reviewed at length with the patient today.  Concerns regarding medicines are outlined above.  Orders Placed This Encounter  Procedures  . EKG 12-Lead   Meds ordered this encounter  Medications  . DISCONTD: nitroGLYCERIN (NITROSTAT) 0.4 MG SL tablet    Sig: PLACE 1 TABLET UNDER THE TONGUE IF NEEDED EVERY 5 MINUTES FOR CHEST PAIN FOR 3 DOSES IF NO RELIEF AFTER FIRST DOSE CALL PRESCRIBER OR 911.    Dispense:  25 tablet    Refill:  4  . DISCONTD: metoprolol tartrate (LOPRESSOR) 25 MG tablet    Sig: Take 1 tablet (25 mg total) by mouth 2 (two) times daily.    Dispense:  180 tablet    Refill:  3  . DISCONTD: benazepril-hydrochlorthiazide (LOTENSIN HCT) 20-12.5 MG tablet    Sig: Take 1 tablet by mouth daily.    Dispense:  90 tablet    Refill:  3  . benazepril-hydrochlorthiazide (LOTENSIN  HCT) 20-12.5 MG tablet    Sig: Take 1 tablet by mouth daily.    Dispense:  90 tablet    Refill:  3  . nitroGLYCERIN (NITROSTAT) 0.4 MG SL tablet  Sig: PLACE 1 TABLET UNDER THE TONGUE IF NEEDED EVERY 5 MINUTES FOR CHEST PAIN FOR 3 DOSES IF NO RELIEF AFTER FIRST DOSE CALL PRESCRIBER OR 911.    Dispense:  25 tablet    Refill:  4  . metoprolol tartrate (LOPRESSOR) 25 MG tablet    Sig: Take 1 tablet (25 mg total) by mouth 2 (two) times daily.    Dispense:  180 tablet    Refill:  3    There are no Patient Instructions on file for this visit.   Signed, Ronald Grooms, MD  09/09/2020 8:59 AM    [place]

## 2020-09-09 ENCOUNTER — Ambulatory Visit (INDEPENDENT_AMBULATORY_CARE_PROVIDER_SITE_OTHER): Payer: Medicare Other | Admitting: Interventional Cardiology

## 2020-09-09 ENCOUNTER — Other Ambulatory Visit: Payer: Self-pay

## 2020-09-09 ENCOUNTER — Encounter: Payer: Self-pay | Admitting: Interventional Cardiology

## 2020-09-09 VITALS — BP 136/72 | HR 48 | Ht 72.0 in | Wt 192.6 lb

## 2020-09-09 DIAGNOSIS — E785 Hyperlipidemia, unspecified: Secondary | ICD-10-CM | POA: Diagnosis not present

## 2020-09-09 DIAGNOSIS — I25118 Atherosclerotic heart disease of native coronary artery with other forms of angina pectoris: Secondary | ICD-10-CM | POA: Diagnosis not present

## 2020-09-09 DIAGNOSIS — I6349 Cerebral infarction due to embolism of other cerebral artery: Secondary | ICD-10-CM

## 2020-09-09 DIAGNOSIS — I10 Essential (primary) hypertension: Secondary | ICD-10-CM

## 2020-09-09 DIAGNOSIS — I739 Peripheral vascular disease, unspecified: Secondary | ICD-10-CM | POA: Diagnosis not present

## 2020-09-09 DIAGNOSIS — Z7189 Other specified counseling: Secondary | ICD-10-CM

## 2020-09-09 DIAGNOSIS — Z7901 Long term (current) use of anticoagulants: Secondary | ICD-10-CM

## 2020-09-09 DIAGNOSIS — I5042 Chronic combined systolic (congestive) and diastolic (congestive) heart failure: Secondary | ICD-10-CM

## 2020-09-09 MED ORDER — METOPROLOL TARTRATE 25 MG PO TABS: 25.0000 mg | ORAL_TABLET | Freq: Two times a day (BID) | ORAL | 3 refills | Status: DC

## 2020-09-09 MED ORDER — BENAZEPRIL-HYDROCHLOROTHIAZIDE 20-12.5 MG PO TABS: 1.0000 | ORAL_TABLET | Freq: Every day | ORAL | 3 refills | Status: DC

## 2020-09-09 MED ORDER — NITROGLYCERIN 0.4 MG SL SUBL: SUBLINGUAL_TABLET | SUBLINGUAL | 4 refills | Status: DC

## 2020-09-09 NOTE — Patient Instructions (Signed)

## 2020-09-21 ENCOUNTER — Other Ambulatory Visit: Payer: Self-pay | Admitting: Interventional Cardiology

## 2020-09-28 ENCOUNTER — Other Ambulatory Visit: Payer: Self-pay | Admitting: Interventional Cardiology

## 2020-09-28 MED ORDER — AMLODIPINE BESYLATE 5 MG PO TABS: 5.0000 mg | ORAL_TABLET | Freq: Every day | ORAL | 3 refills | Status: DC

## 2021-10-03 NOTE — Progress Notes (Signed)
Cardiology Office Note:    Date:  10/04/2021   ID:  Ronald Duran, DOB 15-Apr-1941, MRN [id number]  PCP:  Josetta Huddle, MD  Cardiologist:  Sinclair Grooms, MD   Referring MD: Josetta Huddle, MD   Chief Complaint  Patient presents with   Congestive Heart Failure   Coronary Artery Disease   Hyperlipidemia   Hypertension    History of Present Illness:    Ronald Duran is a 81 y.o. 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  Last echocardiogram 2018 at [facility].  He is doing well.  No neurological complaints have recurred.  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  Has been maintained on both aspirin and Coumadin since that time.  No excessive bleeding.  Denies angina.  Has significant bradycardia.  No lightheadedness, syncope, or presyncope.  Physically quite active without limitations.  Past Medical History:  Diagnosis Date   CAD (coronary artery disease)    a. s/p MI with LAD stent placement December, 1999 -last stress test November 2008 with no EKG evidence of ischemia  b. cath 07/23/2015 after abnormal ETT and nuc, occluded mid LAD stent with good distal collateral, medical therapy    Cardiomyopathy ([place])    Ischemic cardiomyopathy with EF 40% by echo 2005   Dyslipidemia    Erectile dysfunction    Hiatal hernia    with esophageal strictures and dilatations in the past - Dr. Delfin Edis   History of nuclear stress test    Myoview 12/16: EF 47%, anterior, anteroseptal and inferoseptal defect suggesting infarct with peri-infarct ischemia, intermediate risk   HTN (hypertension)    Hx of seasonal allergies    Hyperlipidemia    PAD (peripheral artery disease) ([place])    Pre-diabetes    Prostate cancer ([place])     with seed implant therapy following Lupron therapy in 2008 and 2009 - followed by [name]   Ringworm    Vitamin D deficiency     Past Surgical History:  Procedure Laterality Date   CARDIAC CATHETERIZATION N/A 07/23/2015   Procedure: Left Heart Cath and Coronary Angiography;  Surgeon: Belva Crome, MD;  Location: [facility];  Service: Cardiovascular;  Laterality: N/A;   COLONOSCOPY WITH PROPOFOL N/A 11/24/2015   Procedure: COLONOSCOPY WITH PROPOFOL;  Surgeon: Garlan Fair, MD;  Location: [facility];  Service: Endoscopy;  Laterality: N/A;   esophageal strictures with dilatations in the past per Dr. Delfin Edis     last '98   HERNIA REPAIR Left    LAD stent placement - Dr. Daneen Schick     left inguinal hernia repair - Dr. Magdalene River     prostatic radioactive seed implantation - Dr. Valere Dross     '09    Current Medications: Current Meds  Medication Sig   amLODipine (NORVASC) 5 MG tablet Take 1 tablet (5 mg total) by mouth daily.   atorvastatin (LIPITOR) 40 MG tablet Take 40 mg by mouth.   B Complex Vitamins (B COMPLEX 100) tablet Take 1 tablet by mouth daily.   benazepril-hydrochlorthiazide (LOTENSIN HCT) 20-12.5 MG tablet Take 1 tablet by mouth daily.   cetirizine (ZYRTEC) 10 MG tablet Take 10 mg by mouth  daily.   Cholecalciferol (VITAMIN D3) 2000 UNITS TABS Take 1 tablet by mouth daily.    Coenzyme Q10 (CO Q-10) 100 MG CAPS Take 1 capsule by mouth daily.    ketoconazole (NIZORAL) 2 % cream as needed.   metoprolol succinate (TOPROL XL) 25 MG 24 hr tablet Take 1 tablet (25 mg total) by mouth daily.   Multiple Vitamins-Minerals (CENTRUM) tablet Take 1 tablet by mouth daily.   niacin (NIASPAN) 500 MG CR tablet Take 1,000 mg by mouth at bedtime.    nitroGLYCERIN (NITROSTAT) 0.4 MG SL tablet PLACE 1 TABLET UNDER THE TONGUE IF NEEDED EVERY 5 MINUTES FOR CHEST PAIN FOR 3 DOSES IF NO RELIEF AFTER FIRST DOSE CALL PRESCRIBER OR 911.   Omega-3 Fatty Acids (OMEGA 3 PO) Take 2,400  mg by mouth daily.   warfarin (COUMADIN) 5 MG tablet take 1 tablet by mouth once daily or as directed   [DISCONTINUED] aspirin 81 MG chewable tablet Chew 81 mg by mouth daily.   [DISCONTINUED] metoprolol tartrate (LOPRESSOR) 25 MG tablet Take 1 tablet (25 mg total) by mouth 2 (two) times daily.     Allergies:   Patient has no known allergies.   Social History   Socioeconomic History   Marital status: Married    Spouse name: Not on file   Number of children: Not on file   Years of education: Not on file   Highest education level: Not on file  Occupational History   Not on file  Tobacco Use   Smoking status: Never   Smokeless tobacco: Never  Vaping Use   Vaping Use: Never used  Substance and Sexual Activity   Alcohol use: Yes    Alcohol/week: 0.0 standard drinks    Comment: occasionally for special occasions     Drug use: No   Sexual activity: Not on file  Other Topics Concern   Not on file  Social History Narrative   Not on file   Social Determinants of Health   Financial Resource Strain: Not on file  Food Insecurity: Not on file  Transportation Needs: Not on file  Physical Activity: Not on file  Stress: Not on file  Social Connections: Not on file     Family History: The patient's family history includes CAD in his sister; CVA in his mother; CVA (age of onset: 32) in his father.  ROS:   Please see the history of present illness.    Denies claudication.  No medication side effects.  No bleeding.  No recurrent neurological symptoms.  All other systems reviewed and are negative.  EKGs/Labs/Other Studies Reviewed:    The following studies were reviewed today: No recent LV assessment, last performed in 2018.  EKG:  EKG marked sinus bradycardia, anteroseptal infarction V1 through V4.  EKG on September 09, 2020 is essentially unchanged.  Recent Labs: No results found for requested labs within last 8760 hours.  Recent Lipid Panel No results found for: CHOL, TRIG,  HDL, CHOLHDL, VLDL, LDLCALC, LDLDIRECT  Physical Exam:    VS:  BP 122/78    Pulse (!) 47    Ht 6' (1.829 m)    Wt 193 lb 6.4 oz (87.7 kg)    SpO2 96%    BMI 26.23 kg/m     Wt Readings from Last 3 Encounters:  10/04/21 193 lb 6.4 oz (87.7 kg)  09/09/20 192 lb 9.6 oz (87.4 kg)  09/03/19 194 lb (88 kg)     GEN: Appears younger than stated age. No  acute distress HEENT: Normal NECK: No JVD. LYMPHATICS: No lymphadenopathy CARDIAC: No murmur. RRR no gallop, or edema. VASCULAR:  Normal Pulses. No bruits. RESPIRATORY:  Clear to auscultation without rales, wheezing or rhonchi  ABDOMEN: Soft, non-tender, non-distended, No pulsatile mass, MUSCULOSKELETAL: No deformity  SKIN: Warm and dry NEUROLOGIC:  Alert and oriented x 3 PSYCHIATRIC:  Normal affect   ASSESSMENT:    1. Chronic combined systolic and diastolic heart failure, NYHA class 1 ([place])   2. Coronary artery disease of native artery of native heart with stable angina pectoris ([place])   3. Hyperlipidemia, unspecified hyperlipidemia type   4. PAD (peripheral artery disease) ([place])   5. Essential hypertension   6. 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 Chronic anticoagulation   8. Bradycardia, severe sinus    PLAN:    In order of problems listed above:  Needs repeat echocardiogram.  May need to make adjustments in treatment algorithm based upon LV function.  Not currently on SGLT2 or MRA.  He is on moderate dose ARB.  I will switch metoprolol to tartrate to metoprolol succinate but decreased overall daily dose to 25 mg rather than 50 to account for the patient's significant bradycardia.  2D Doppler echocardiogram to reassess LV systolic function. Secondary prevention discussed. Continue high intensity statin therapy.  Most recent LDL was 58. Continue physical activity.  Follow-up with Dr. Fletcher Anon as recommended. Blood pressure control is adequate.  Pressure target 130/80 mmHg.  Need to monitor  blood pressure on lower dose metoprolol. Discontinue aspirin.  Consistent continue Coumadin. Have discontinued aspirin.  Continue Coumadin therapy.  Goal is to decrease the risk of bleeding complications downstream. Decrease metoprolol to 25 mg/day and switch to succinate rather than tartrate since he does have mild systolic dysfunction.    Overall education and awareness concerning secondary risk prevention was discussed in detail: LDL less than 70, hemoglobin A1c less than 7, blood pressure target less than 130/80 mmHg, >150 minutes of moderate aerobic activity per week, avoidance of smoking, weight control (via diet and exercise), and continued surveillance/management of/for obstructive sleep apnea.  Stop aspirin  Adjust heart failure therapy if needed based upon echo results.  Medication Adjustments/Labs and Tests Ordered: Current medicines are reviewed at length with the patient today.  Concerns regarding medicines are outlined above.  Orders Placed This Encounter  Procedures   EKG 12-Lead   ECHOCARDIOGRAM COMPLETE   Meds ordered this encounter  Medications   metoprolol succinate (TOPROL XL) 25 MG 24 hr tablet    Sig: Take 1 tablet (25 mg total) by mouth daily.    Dispense:  90 tablet    Refill:  3    D/c Tartrate    Patient Instructions  Medication Instructions:  1) DECREASE Metoprolol Tartrate to a half tablet twice a day until you give out and then, 2) START Metoprolol Succinate 25mg  once daily 3) DISCONTINUE Aspirin  *If you need a refill on your cardiac medications before your next appointment, please call your pharmacy*   Lab Work: None If you have labs (blood work) drawn today and your tests are completely normal, you will receive your results only by: [place] (if you have MyChart) OR A paper copy in the mail If you have any lab test that is abnormal or we need to change your treatment, we will call you to review the  results.   Testing/Procedures: Your physician has requested that you have an echocardiogram. Echocardiography is a  painless test that uses sound waves to create images of your heart. It provides your doctor with information about the size and shape of your heart and how well your hearts chambers and valves are working. This procedure takes approximately one hour. There are no restrictions for this procedure.   Follow-Up: At [facility], you and your health needs are our priority.  As part of our continuing mission to provide you with exceptional heart care, we have created designated Provider Care Teams.  These Care Teams include your primary Cardiologist (physician) and Advanced Practice Providers (APPs -  Physician Assistants and Nurse Practitioners) who all work together to provide you with the care you need, when you need it.  We recommend signing up for the patient portal called "MyChart".  Sign up information is provided on this After Visit Summary.  MyChart is used to connect with patients for Virtual Visits (Telemedicine).  Patients are able to view lab/test results, encounter notes, upcoming appointments, etc.  Non-urgent messages can be sent to your provider as well.   To learn more about what you can do with MyChart, go to [email or web address].    Your next appointment:   1 year(s)  The format for your next appointment:   In Person  Provider:   Sinclair Grooms, MD     Other Instructions     Signed, Sinclair Grooms, MD  10/04/2021 8:58 AM    [place]

## 2021-10-04 ENCOUNTER — Other Ambulatory Visit: Payer: Self-pay

## 2021-10-04 ENCOUNTER — Encounter: Payer: Self-pay | Admitting: Interventional Cardiology

## 2021-10-04 ENCOUNTER — Ambulatory Visit (INDEPENDENT_AMBULATORY_CARE_PROVIDER_SITE_OTHER): Payer: Medicare Other | Admitting: Interventional Cardiology

## 2021-10-04 VITALS — BP 122/78 | HR 47 | Ht 72.0 in | Wt 193.4 lb

## 2021-10-04 DIAGNOSIS — I739 Peripheral vascular disease, unspecified: Secondary | ICD-10-CM

## 2021-10-04 DIAGNOSIS — R001 Bradycardia, unspecified: Secondary | ICD-10-CM

## 2021-10-04 DIAGNOSIS — I5042 Chronic combined systolic (congestive) and diastolic (congestive) heart failure: Secondary | ICD-10-CM | POA: Diagnosis not present

## 2021-10-04 DIAGNOSIS — E785 Hyperlipidemia, unspecified: Secondary | ICD-10-CM | POA: Diagnosis not present

## 2021-10-04 DIAGNOSIS — Z7901 Long term (current) use of anticoagulants: Secondary | ICD-10-CM

## 2021-10-04 DIAGNOSIS — I6349 Cerebral infarction due to embolism of other cerebral artery: Secondary | ICD-10-CM

## 2021-10-04 DIAGNOSIS — I25118 Atherosclerotic heart disease of native coronary artery with other forms of angina pectoris: Secondary | ICD-10-CM

## 2021-10-04 DIAGNOSIS — I10 Essential (primary) hypertension: Secondary | ICD-10-CM

## 2021-10-04 MED ORDER — METOPROLOL SUCCINATE ER 25 MG PO TB24: 25.0000 mg | ORAL_TABLET | Freq: Every day | ORAL | 3 refills | Status: DC

## 2021-10-04 NOTE — Patient Instructions (Signed)
Medication Instructions:  1) DECREASE Metoprolol Tartrate to a half tablet twice a day until you give out and then, 2) START Metoprolol Succinate 25mg  once daily 3) DISCONTINUE Aspirin  *If you need a refill on your cardiac medications before your next appointment, please call your pharmacy*   Lab Work: None If you have labs (blood work) drawn today and your tests are completely normal, you will receive your results only by: [place] (if you have MyChart) OR A paper copy in the mail If you have any lab test that is abnormal or we need to change your treatment, we will call you to review the results.   Testing/Procedures: Your physician has requested that you have an echocardiogram. Echocardiography is a painless test that uses sound waves to create images of your heart. It provides your doctor with information about the size and shape of your heart and how well your hearts chambers and valves are working. This procedure takes approximately one hour. There are no restrictions for this procedure.   Follow-Up: At [facility], you and your health needs are our priority.  As part of our continuing mission to provide you with exceptional heart care, we have created designated Provider Care Teams.  These Care Teams include your primary Cardiologist (physician) and Advanced Practice Providers (APPs -  Physician Assistants and Nurse Practitioners) who all work together to provide you with the care you need, when you need it.  We recommend signing up for the patient portal called "MyChart".  Sign up information is provided on this After Visit Summary.  MyChart is used to connect with patients for Virtual Visits (Telemedicine).  Patients are able to view lab/test results, encounter notes, upcoming appointments, etc.  Non-urgent messages can be sent to your provider as well.   To learn more about what you can do with MyChart, go to [email or web address].    Your next appointment:   1  year(s)  The format for your next appointment:   In Person  Provider:   Sinclair Grooms, MD     Other Instructions

## 2021-10-21 ENCOUNTER — Other Ambulatory Visit: Payer: Self-pay

## 2021-10-21 ENCOUNTER — Ambulatory Visit (HOSPITAL_COMMUNITY): Payer: Medicare Other | Attending: Cardiology

## 2021-10-21 DIAGNOSIS — I5042 Chronic combined systolic (congestive) and diastolic (congestive) heart failure: Secondary | ICD-10-CM | POA: Diagnosis present

## 2021-10-21 LAB — ECHOCARDIOGRAM COMPLETE
AR max vel: 1.56 cm2
AV Area VTI: 1.72 cm2
AV Area mean vel: 1.56 cm2
AV Mean grad: 7 mmHg
AV Peak grad: 13.2 mmHg
Ao pk vel: 1.82 m/s
Area-P 1/2: 3.31 cm2
P 1/2 time: 1006 msec
S' Lateral: 3.55 cm

## 2021-10-21 MED ORDER — PERFLUTREN LIPID MICROSPHERE
1.0000 mL | INTRAVENOUS | Status: AC | PRN
  Administered 2021-10-21: 2 mL via INTRAVENOUS

## 2021-11-01 ENCOUNTER — Other Ambulatory Visit: Payer: Self-pay

## 2021-11-01 MED ORDER — AMLODIPINE BESYLATE 5 MG PO TABS: 5.0000 mg | ORAL_TABLET | Freq: Every day | ORAL | 3 refills | Status: DC

## 2021-11-29 ENCOUNTER — Other Ambulatory Visit: Payer: Self-pay | Admitting: *Deleted

## 2021-11-29 MED ORDER — BENAZEPRIL-HYDROCHLOROTHIAZIDE 20-12.5 MG PO TABS: 1.0000 | ORAL_TABLET | Freq: Every day | ORAL | 3 refills | Status: DC

## 2022-01-26 ENCOUNTER — Other Ambulatory Visit: Payer: Self-pay

## 2022-01-26 MED ORDER — NITROGLYCERIN 0.4 MG SL SUBL: SUBLINGUAL_TABLET | SUBLINGUAL | 9 refills | Status: DC

## 2022-05-04 ENCOUNTER — Telehealth: Payer: Self-pay | Admitting: Interventional Cardiology

## 2022-05-04 NOTE — Telephone Encounter (Signed)
Returning nurses call. Please advise

## 2022-05-04 NOTE — Telephone Encounter (Signed)
Pt's PCP office would like a callback regarding pt's recent EKG. Please advise

## 2022-05-04 NOTE — Telephone Encounter (Signed)
Spoke with Dr. Moses Manners nurse Ardelia Mems who states that the patient was seen in their office two days ago and he was found to have new onset Afib. He is already on warfarin due to a prior stroke. Dr. Inda Merlin wanted the patient to be seen in our office in the next couple of weeks to discuss. Lottie states that the patient called but they could not get him an appointment until December and Dr. Inda Merlin would like for the patient to be seen sooner.  I called the patient and offered him an appointment with an APP next week. Patient is agreeable to being seen next week by APP. He does however prefer to be seen by Dr. Tamala Julian if anything opens up.

## 2022-05-04 NOTE — Telephone Encounter (Signed)
Left message for Ronald Duran to call back.

## 2022-05-05 NOTE — Progress Notes (Signed)
Cardiology Office Note:    Date:  05/11/2022   ID:  Ronald Duran, DOB 1940/12/22, MRN [id number]  PCP:  Ronald Duran, [facility] Cardiologist:  Ronald Grooms, MD     Referring MD: Ronald Huddle, MD   Chief Complaint:  No chief complaint on file.     History of Present Illness:   Ronald Duran is a 81 y.o. 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   Last echocardiogram 2018 at [facility].  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 Has been maintained on both aspirin and Coumadin since that time. No excessive bleeding.  Patient saw Dr. Tamala Duran 09/2021. He was bradycardic so metoprolol decreased 25 mg daily. ASA stopped.. Repeat echo 10/21/21 LVEF 45-50%. No changes made  Patient found to be in new Afib at PCP visit. Was already on coumadin but added onto my schedule for further eval and treatment.  Patient doesn't feel his Afib at all. Rate controlled on Toprol XL.  Denies chest pain, palpitations, dyspnea, edema. Walks 2-3 miles 4-5 days/week. Labs 05/02/22 TSH 1.74, LDL 66.    Past Medical History:  Diagnosis Date   CAD (coronary artery disease)    a. s/p MI with LAD stent placement December, 1999 -last stress test November 2008 with no EKG evidence of ischemia  b. cath 07/23/2015 after abnormal ETT and nuc, occluded mid LAD stent with good distal collateral, medical therapy    Cardiomyopathy ([place])    Ischemic cardiomyopathy with EF 40% by echo 2005   Dyslipidemia    Erectile dysfunction    Hiatal hernia    with esophageal strictures and dilatations in the past - Dr. Delfin Duran   History of nuclear stress test    Myoview 12/16: EF 47%, anterior, anteroseptal and  inferoseptal defect suggesting infarct with peri-infarct ischemia, intermediate risk   HTN (hypertension)    Hx of seasonal allergies    Hyperlipidemia    PAD (peripheral artery disease) ([place])    Pre-diabetes    Prostate cancer (Ronald Duran)    with seed implant therapy following Lupron therapy in 2008 and 2009 - followed by Dr. Risa Duran   Ringworm    Vitamin D deficiency    Current Medications: Current Meds  Medication Sig   amLODipine (NORVASC) 5 MG tablet Take 1 tablet (5 mg total) by mouth daily.   atorvastatin (LIPITOR) 40 MG tablet Take 40 mg by mouth.   B Complex Vitamins (B COMPLEX 100) tablet Take 1 tablet by mouth daily.   benazepril-hydrochlorthiazide (LOTENSIN HCT) 20-12.5 MG tablet Take 1 tablet by mouth daily.   cetirizine (ZYRTEC) 10 MG tablet Take 10 mg by mouth daily.   Cholecalciferol (VITAMIN D3) 2000 UNITS TABS Take 1 tablet by mouth daily.    Coenzyme Q10 (CO Q-10) 100 MG CAPS Take 1 capsule by mouth daily.    ketoconazole (NIZORAL) 2 % cream as needed.   metoprolol succinate (TOPROL XL) 25 MG 24 hr tablet Take 1 tablet (25 mg total) by mouth daily.   Multiple Vitamins-Minerals (CENTRUM) tablet Take 1 tablet by mouth daily.   niacin (NIASPAN) 500 MG CR tablet Take 1,000 mg by  mouth at bedtime.    nitroGLYCERIN (NITROSTAT) 0.4 MG SL tablet PLACE 1 TABLET UNDER THE TONGUE IF NEEDED EVERY 5 MINUTES FOR CHEST PAIN FOR 3 DOSES IF NO RELIEF AFTER FIRST DOSE CALL 911.   Omega-3 Fatty Acids (OMEGA 3 PO) Take 2,400 mg by mouth daily.   warfarin (COUMADIN) 5 MG tablet take 1 tablet by mouth once daily or as directed    Allergies:   Patient has no known allergies.   Social History   Tobacco Use   Smoking status: Never   Smokeless tobacco: Never  Vaping Use   Vaping Use: Never used  Substance Use Topics   Alcohol use: Yes    Alcohol/week: 0.0 standard drinks of alcohol    Comment: occasionally for special occasions     Drug use: No    Family Hx: The patient's family  history includes CAD in his sister; CVA in his mother; CVA (age of onset: 48) in his father.  ROS   EKGs/Labs/Other Test Reviewed:    EKG:  EKG is not ordered today.  The ekg reviewed from Dr. Inda Duran Afib 77/m old ant MI nonspecific   Recent Labs: No results found for requested labs within last 365 days.   Recent Lipid Panel No results for input(s): "CHOL", "TRIG", "HDL", "VLDL", "LDLCALC", "LDLDIRECT" in the last 8760 hours.   Prior CV Studies:   Echo 10/2021 IMPRESSIONS     1. Left ventricular ejection fraction, by estimation, is 45 to 50%. The  left ventricle has mildly decreased function. The left ventricle  demonstrates regional wall motion abnormalities (see scoring  diagram/findings for description). Left ventricular  diastolic parameters are indeterminate. There is moderate hypokinesis of  the left ventricular, mid-apical inferior wall. There is of the left  ventricular, apical apical segment.   2. Right ventricular systolic function is normal. The right ventricular  size is normal. There is normal pulmonary artery systolic pressure.   3. Left atrial size was moderately dilated.   4. Right atrial size was mild to moderately dilated.   5. The mitral valve is normal in structure. Trivial mitral valve  regurgitation. No evidence of mitral stenosis.   6. The aortic valve is tricuspid. There is mild calcification of the  aortic valve. Aortic valve regurgitation is trivial. Aortic valve  sclerosis is present, with no evidence of aortic valve stenosis.   7. The inferior vena cava is normal in size with greater than 50%  respiratory variability, suggesting right atrial pressure of 3 mmHg.   Comparison(s): Prior images unable to be directly viewed, comparison made  by report only. Conclusion(s)/Recommendation(s): Mildly reduced LVEF with akinetic apex  and hypokinetic mid to distal inferior wall. No LV thormbus visualized  with echo contrast.     Risk  Assessment/Calculations/Metrics:    CHA2DS2-VASc Score = 7   This indicates a 11.2% annual risk of stroke. The patient's score is based upon: CHF History: 1 HTN History: 1 Diabetes History: 0 Stroke History: 2 Vascular Disease History: 1 Age Score: 2 Gender Score: 0             Physical Exam:    VS:  BP 122/68   Pulse 81   Ht 6' (1.829 m)   Wt 194 lb 12.8 oz (88.4 kg)   SpO2 100%   BMI 26.42 kg/m     Wt Readings from Last 3 Encounters:  05/10/22 194 lb 12.8 oz (88.4 kg)  10/04/21 193 lb 6.4 oz (87.7 kg)  09/09/20 192 lb  9.6 oz (87.4 kg)    Physical Exam  GEN: Thin, in no acute distress  Neck: no JVD, carotid bruits, or masses Cardiac:irreg irreg; no murmurs, rubs, or gallops  Respiratory:  clear to auscultation bilaterally, normal work of breathing GI: soft, nontender, nondistended, + BS Ext: without cyanosis, clubbing, or edema, Good distal pulses bilaterally Neuro:  Alert and Oriented x 3,  Psych: euthymic mood, full affect       ASSESSMENT & PLAN:   No problem-specific Assessment & Plan notes found for this encounter.   Afib new for patient-already on coumadin. Echo 10/2021 LVEF 45-50%, LA moderately dilated. Patient asymptomatic. Discussed possible DCCV vs rate control. I'll discuss with Dr. Tamala Duran for final recommendations. INR 2.7 04/28/22 and has been therapeutic per patient-managed by Dr. Moses Manners office.  Discussed with Dr. Tamala Duran who recommends patient proceed with DCCV for better long term outcome. I spoke with patient who is agreeable. He will have to have INR checked and therapeutic for 4 consecutive weeks and then DCCV.      CAD MI PCI LAD 1999, cath 2016 total LAD in stent with good collaterals, medical treatment. No angina  ICM EF 45-50% on echo 10/2021.   Chronic combined CHF compensated  HTN-well controlled  Bradycardia-metoprolol reduced 09/2021  HLD LDL 66 05/02/22  History of CVA on coumadin  PAD          Dispo:  No follow-ups on  file.   Medication Adjustments/Labs and Tests Ordered: Current medicines are reviewed at length with the patient today.  Concerns regarding medicines are outlined above.  Tests Ordered: No orders of the defined types were placed in this encounter.  Medication Changes: No orders of the defined types were placed in this encounter.  Signed, Ermalinda Barrios, PA-C  05/11/2022 1:11 PM    [address], [place],   [address] Phone: [phone]; Fax: [phone]

## 2022-05-10 ENCOUNTER — Encounter: Payer: Self-pay | Admitting: Physician Assistant

## 2022-05-10 ENCOUNTER — Ambulatory Visit: Payer: Medicare Other | Attending: Physician Assistant | Admitting: Physician Assistant

## 2022-05-10 VITALS — BP 122/68 | HR 81 | Ht 72.0 in | Wt 194.8 lb

## 2022-05-10 DIAGNOSIS — Z8673 Personal history of transient ischemic attack (TIA), and cerebral infarction without residual deficits: Secondary | ICD-10-CM

## 2022-05-10 DIAGNOSIS — I4819 Other persistent atrial fibrillation: Secondary | ICD-10-CM

## 2022-05-10 DIAGNOSIS — I5042 Chronic combined systolic (congestive) and diastolic (congestive) heart failure: Secondary | ICD-10-CM

## 2022-05-10 DIAGNOSIS — I255 Ischemic cardiomyopathy: Secondary | ICD-10-CM

## 2022-05-10 DIAGNOSIS — I10 Essential (primary) hypertension: Secondary | ICD-10-CM

## 2022-05-10 DIAGNOSIS — I739 Peripheral vascular disease, unspecified: Secondary | ICD-10-CM

## 2022-05-10 DIAGNOSIS — E782 Mixed hyperlipidemia: Secondary | ICD-10-CM

## 2022-05-10 DIAGNOSIS — I2510 Atherosclerotic heart disease of native coronary artery without angina pectoris: Secondary | ICD-10-CM

## 2022-05-10 DIAGNOSIS — R001 Bradycardia, unspecified: Secondary | ICD-10-CM

## 2022-05-10 NOTE — Patient Instructions (Signed)
Medication Instructions:  Your physician recommends that you continue on your current medications as directed. Please refer to the Current Medication list given to you today.  *If you need a refill on your cardiac medications before your next appointment, please call your pharmacy*   Lab Work: None ordered  If you have labs (blood work) drawn today and your tests are completely normal, you will receive your results only by: MyChart Message (if you have MyChart) OR A paper copy in the mail If you have any lab test that is abnormal or we need to change your treatment, we will call you to review the results.   Testing/Procedures: None ordered   Follow-Up: At  [facility], you and your health needs are our priority.  As part of our continuing mission to provide you with exceptional heart care, we have created designated Provider Care Teams.  These Care Teams include your primary Cardiologist (physician) and Advanced Practice Providers (APPs -  Physician Assistants and Nurse Practitioners) who all work together to provide you with the care you need, when you need it.  We recommend signing up for the patient portal called "MyChart".  Sign up information is provided on this After Visit Summary.  MyChart is used to connect with patients for Virtual Visits (Telemedicine).  Patients are able to view lab/test results, encounter notes, upcoming appointments, etc.  Non-urgent messages can be sent to your provider as well.   To learn more about what you can do with MyChart, go to [email or web address].    Your next appointment:   As scheduled  The format for your next appointment:   In Person  Provider:   Henry W Smith III, MD     Other Instructions   Important Information About Sugar       

## 2022-05-11 ENCOUNTER — Other Ambulatory Visit: Payer: Self-pay | Admitting: *Deleted

## 2022-05-11 DIAGNOSIS — I4819 Other persistent atrial fibrillation: Secondary | ICD-10-CM

## 2022-05-11 NOTE — Addendum Note (Signed)
Addended by: Imogene Burn on: 05/11/2022 01:11 PM   Modules accepted: Orders

## 2022-05-12 ENCOUNTER — Other Ambulatory Visit: Payer: Self-pay

## 2022-05-12 ENCOUNTER — Ambulatory Visit: Payer: Medicare Other | Attending: Cardiology

## 2022-05-12 DIAGNOSIS — I4891 Unspecified atrial fibrillation: Secondary | ICD-10-CM | POA: Insufficient documentation

## 2022-05-12 DIAGNOSIS — Z79899 Other long term (current) drug therapy: Secondary | ICD-10-CM

## 2022-05-12 DIAGNOSIS — I4819 Other persistent atrial fibrillation: Secondary | ICD-10-CM

## 2022-05-12 DIAGNOSIS — I6349 Cerebral infarction due to embolism of other cerebral artery: Secondary | ICD-10-CM

## 2022-05-12 DIAGNOSIS — Z5181 Encounter for therapeutic drug level monitoring: Secondary | ICD-10-CM | POA: Diagnosis not present

## 2022-05-12 LAB — POCT INR: INR: 2.4 (ref 2.0–3.0)

## 2022-05-12 NOTE — Patient Instructions (Signed)
Take 1 tablet Daily, except 0.5 tablet Monday, Wednesday and Friday. INR in 1 week. Cardioversion 10/27 weekly INRs.  A full discussion of the nature of anticoagulants has been carried out.  A benefit risk analysis has been presented to the patient, so that they understand the justification for choosing anticoagulation at this time. The need for frequent and regular monitoring, precise dosage adjustment and compliance is stressed.  Side effects of potential bleeding are discussed.  The patient should avoid any OTC items containing aspirin or ibuprofen, and should avoid great swings in general diet.  Avoid alcohol consumption.  Call if any signs of abnormal bleeding.  [phone]

## 2022-05-19 ENCOUNTER — Ambulatory Visit: Payer: Medicare Other | Attending: Interventional Cardiology | Admitting: *Deleted

## 2022-05-19 ENCOUNTER — Ambulatory Visit (INDEPENDENT_AMBULATORY_CARE_PROVIDER_SITE_OTHER): Payer: Medicare Other | Admitting: Interventional Cardiology

## 2022-05-19 ENCOUNTER — Encounter: Payer: Self-pay | Admitting: Interventional Cardiology

## 2022-05-19 VITALS — BP 132/76 | HR 87 | Ht 72.0 in | Wt 194.2 lb

## 2022-05-19 DIAGNOSIS — Z7901 Long term (current) use of anticoagulants: Secondary | ICD-10-CM

## 2022-05-19 DIAGNOSIS — Z79899 Other long term (current) drug therapy: Secondary | ICD-10-CM | POA: Diagnosis not present

## 2022-05-19 DIAGNOSIS — I4819 Other persistent atrial fibrillation: Secondary | ICD-10-CM

## 2022-05-19 DIAGNOSIS — I6349 Cerebral infarction due to embolism of other cerebral artery: Secondary | ICD-10-CM

## 2022-05-19 DIAGNOSIS — I2510 Atherosclerotic heart disease of native coronary artery without angina pectoris: Secondary | ICD-10-CM | POA: Diagnosis not present

## 2022-05-19 DIAGNOSIS — I10 Essential (primary) hypertension: Secondary | ICD-10-CM

## 2022-05-19 DIAGNOSIS — I5042 Chronic combined systolic (congestive) and diastolic (congestive) heart failure: Secondary | ICD-10-CM

## 2022-05-19 LAB — POCT INR: INR: 2.3 (ref 2.0–3.0)

## 2022-05-19 NOTE — Patient Instructions (Signed)
Medication Instructions:  Your physician has recommended you make the following change in your medication:   1) STOP niacin 2) STOP omega-3 fatty acid  *If you need a refill on your cardiac medications before your next appointment, please call your pharmacy*  Lab Work: NONE  Testing/Procedures: NONE  Follow-Up: At [facility], you and your health needs are our priority.  As part of our continuing mission to provide you with exceptional heart care, we have created designated Provider Care Teams.  These Care Teams include your primary Cardiologist (physician) and Advanced Practice Providers (APPs -  Physician Assistants and Nurse Practitioners) who all work together to provide you with the care you need, when you need it.  Your next appointment:   6-8 week(s) follow-up after cardioversion  The format for your next appointment:   In Person  Provider:   Christen Bame, NP  Important Information About Sugar

## 2022-05-19 NOTE — H&P (View-Only) (Signed)
Cardiology Office Note:    Date:  05/19/2022   ID:  SANCHEZ HEMMER, DOB 1941-04-26, MRN [id number]  PCP:  Ronald Huddle, MD  Cardiologist:  Ronald Grooms, MD   Referring MD: Ronald Huddle, MD   Chief Complaint  Patient presents with   Atrial Fibrillation   Coronary Artery Disease   Congestive Heart Failure    History of Present Illness:    Ronald Duran is a 81 y.o. 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   Ronald Duran is doing well.  He was incidentally found to have atrial fibrillation during his yearly exam with Dr. Mertha Duran.  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  Never before had atrial fibrillation been identified.  Clinically he is in atrial fibrillation today.  He still has typical physical activity endurance.  Did not notice any significant decrease in overall function and endurance.  He denies angina, orthopnea, PND.  No lower extremity swelling.  Electrical cardioversion has been sent for June 03, 2022 by Dr. Harrington Duran.  He will need to have follow-up a couple weeks after the cardioversion with EKG to ensure that he is still in rhythm.  If he has ERAF, he will need consideration for antiarrhythmic therapy and/or ablation.  Past Medical History:  Diagnosis Date   CAD (coronary artery disease)    a. s/p MI with LAD stent placement December, 1999 -last stress test  November 2008 with no EKG evidence of ischemia  b. cath 07/23/2015 after abnormal ETT and nuc, occluded mid LAD stent with good distal collateral, medical therapy    Cardiomyopathy ([place])    Ischemic cardiomyopathy with EF 40% by echo 2005   Dyslipidemia    Erectile dysfunction    Hiatal hernia    with esophageal strictures and dilatations in the past - Dr. Delfin Duran   History of nuclear stress test    Myoview 12/16: EF 47%, anterior, anteroseptal and inferoseptal defect suggesting infarct with peri-infarct ischemia, intermediate risk   HTN (hypertension)    Hx of seasonal allergies    Hyperlipidemia    PAD (peripheral artery disease) ([place])    Pre-diabetes    Prostate cancer ([place])    with seed implant therapy following Lupron therapy in 2008 and 2009 - followed by Dr. Risa Duran   Ringworm    Vitamin D deficiency     Past Surgical History:  Procedure Laterality Date   CARDIAC CATHETERIZATION N/A 07/23/2015   Procedure: Left Heart Cath and Coronary Angiography;  Surgeon: Ronald Crome, MD;  Location: [facility];  Service: Cardiovascular;  Laterality: N/A;   COLONOSCOPY WITH PROPOFOL N/A 11/24/2015   Procedure: COLONOSCOPY WITH PROPOFOL;  Surgeon: Ronald Fair, MD;  Location: [facility];  Service: Endoscopy;  Laterality: N/A;   esophageal strictures with dilatations in the past per Dr. Delfin Duran  last '98   HERNIA REPAIR Left    LAD stent placement - Dr. Daneen Duran     left inguinal hernia repair - Dr. Magdalene Duran     prostatic radioactive seed implantation - Dr. Valere Duran     '09    Current Medications: Current Meds  Medication Sig   amLODipine (NORVASC) 5 MG tablet Take 1 tablet (5 mg total) by mouth daily.   atorvastatin (LIPITOR) 40 MG tablet Take 40 mg by mouth.   B Complex Vitamins (B COMPLEX 100) tablet Take 1 tablet by mouth daily.   benazepril-hydrochlorthiazide (LOTENSIN HCT) 20-12.5 MG tablet Take 1 tablet by mouth daily.   cetirizine (ZYRTEC) 10 MG  tablet Take 10 mg by mouth daily.   Cholecalciferol (VITAMIN D3) 2000 UNITS TABS Take 1 tablet by mouth daily.    Coenzyme Q10 (CO Q-10) 100 MG CAPS Take 1 capsule by mouth daily.    ketoconazole (NIZORAL) 2 % cream as needed.   metoprolol succinate (TOPROL XL) 25 MG 24 hr tablet Take 1 tablet (25 mg total) by mouth daily.   Multiple Vitamins-Minerals (CENTRUM) tablet Take 1 tablet by mouth daily.   nitroGLYCERIN (NITROSTAT) 0.4 MG SL tablet PLACE 1 TABLET UNDER THE TONGUE IF NEEDED EVERY 5 MINUTES FOR CHEST PAIN FOR 3 DOSES IF NO RELIEF AFTER FIRST DOSE CALL 911.   warfarin (COUMADIN) 5 MG tablet take 1 tablet by mouth once daily or as directed   [DISCONTINUED] niacin (NIASPAN) 500 MG CR tablet Take 1,000 mg by mouth at bedtime.    [DISCONTINUED] Omega-3 Fatty Acids (OMEGA 3 PO) Take 2,400 mg by mouth daily.     Allergies:   Patient has no known allergies.   Social History   Socioeconomic History   Marital status: Married    Spouse name: Not on file   Number of children: Not on file   Years of education: Not on file   Highest education level: Not on file  Occupational History   Not on file  Tobacco Use   Smoking status: Never   Smokeless tobacco: Never  Vaping Use   Vaping Use: Never used  Substance and Sexual Activity   Alcohol use: Yes    Alcohol/week: 0.0 standard drinks of alcohol    Comment: occasionally for special occasions     Drug use: No   Sexual activity: Not on file  Other Topics Concern   Not on file  Social History Narrative   Not on file   Social Determinants of Health   Financial Resource Strain: Not on file  Food Insecurity: Not on file  Transportation Needs: Not on file  Physical Activity: Not on file  Stress: Not on file  Social Connections: Not on file     Family History: The patient's family history includes CAD in his sister; CVA in his mother; CVA (age of onset: 69) in his father.  ROS:   Please see the history of present illness.    We  answered questions about the planned upcoming cardioversion on October 27th.  All other systems reviewed and are negative.  EKGs/Labs/Other Studies Reviewed:    The following studies were reviewed today: Echo 04/2017 IMPRESSIONS   Normal global left ventricular size with reduced  systolic function. Akinetic apex. Appears to be thrombus in apex, measuring   approximately 1.4 cm by 1.4 cm. Mild left ventricular hypertrophy. Mildly abnormal left ventricular ejection fraction estimated at   40-45%. Septal E/E' ratio is 11.1 indicating normal filling pressure. Abnormal  relaxation filling pattern of the left ventricle for age   (stage 1 diastolic dysfunction). No previous echo for comparison.    An interatrial shunt was not identified with and without Valsalva.   Left atrial dilatation.    Mitral annular calcification. Mild thickening/calcification of the mitral valve leaflets. Trace to mild mitral regurgitation.   Aortic valve appears to be sclerotic without evidence of stenosis. No significant aortic regurgitation noted.   Trace tricuspid regurgitation. Unable to estimate PASP.    ECHOCARDIOGRAM 10/21/2021 IMPRESSIONS   1. Left ventricular ejection fraction, by estimation, is 45 to 50%. The  left ventricle has mildly decreased function. The left ventricle  demonstrates regional wall motion abnormalities (see scoring  diagram/findings for description). Left ventricular  diastolic parameters are indeterminate. There is moderate hypokinesis of  the left ventricular, mid-apical inferior wall. There is of the left  ventricular, apical apical segment.   2. Right ventricular systolic function is normal. The right ventricular  size is normal. There is normal pulmonary artery systolic pressure.   3. Left atrial size was moderately dilated.   4. Right atrial size was mild to moderately dilated.   5. The mitral valve is normal in structure. Trivial mitral valve  regurgitation. No evidence of mitral  stenosis.   6. The aortic valve is tricuspid. There is mild calcification of the  aortic valve. Aortic valve regurgitation is trivial. Aortic valve  sclerosis is present, with no evidence of aortic valve stenosis.   7. The inferior vena cava is normal in size with greater than 50%  respiratory variability, suggesting right atrial pressure of 3 mmHg. EKG:  EKG not repeated at  Recent Labs: No results found for requested labs within last 365 days.  Recent Lipid Panel No results found for: "CHOL", "TRIG", "HDL", "CHOLHDL", "VLDL", "LDLCALC", "LDLDIRECT"  Physical Exam:    VS:  BP 132/76   Pulse 87   Ht 6' (1.829 m)   Wt 194 lb 3.2 oz (88.1 kg)   SpO2 96%   BMI 26.34 kg/m     Wt Readings from Last 3 Encounters:  05/19/22 194 lb 3.2 oz (88.1 kg)  05/10/22 194 lb 12.8 oz (88.4 kg)  10/04/21 193 lb 6.4 oz (87.7 kg)     GEN: Healthy appearing. No acute distress HEENT: Normal NECK: No JVD. LYMPHATICS: No lymphadenopathy CARDIAC: No murmur. IIRR no gallop, or edema. VASCULAR:  Normal Pulses. No bruits. RESPIRATORY:  Clear to auscultation without rales, wheezing or rhonchi  ABDOMEN: Soft, non-tender, non-distended, No pulsatile mass, MUSCULOSKELETAL: No deformity  SKIN: Warm and dry NEUROLOGIC:  Alert and oriented x 3 PSYCHIATRIC:  Normal affect   ASSESSMENT:    1. Persistent atrial fibrillation ([place])   2. Long term current use of therapeutic drug   3. Cerebrovascular accident (CVA) due to embolism of other cerebral artery ([place])   4. Coronary artery disease involving native coronary artery of native heart without angina pectoris   5. Chronic combined systolic and diastolic heart failure, NYHA class 1 ([employer])   6. Essential hypertension    PLAN:    In order of problems listed above:  Duration is unknown.  Moderate left atrial enlargement by echo 9 months ago.  He has been set up for electrical cardioversion since he already has mild reduction in LV systolic function dating  back to his myocardial infarction greater than 23 years ago.  Excepting persistent atrial fibrillation will cause downstream likelihood of progressive LV and RV dysfunction due to mitral and tricuspid  valve dysfunction.  He is very active.  His long-term survivability over the next 10 years should be relatively good. Continue Coumadin Did not discuss Continue secondary prevention with aggressive lipid-lowering.  He is on over-the-counter omega-3 fatty acids and over-the-counter niacin, neither of which is necessarily helpful for him.  His triglyceride is 91.  Suggested that he discontinue both of those but continue his statin.  Have a lipid panel done when he sees his new primary physician in November.  If triglyceride is less than 135, I do not believe any specific therapy for triglyceride therapy will be needed. Though his LV function has been stable for 20 years in the 40 to 45% range, it makes sense to consider recalibrating his heart failure therapy to include an ARNI/SGLT2/an MRA if blood pressure tolerates.  He has significant sinus bradycardia at rest on low-dose metoprolol so this can probably not be further titrated. Blood pressure is well controlled  Overall education and awareness concerning primary/secondary risk prevention was discussed in detail: LDL less than 70, hemoglobin A1c less than 7, blood pressure target less than 130/80 mmHg, >150 minutes of moderate aerobic activity per week, avoidance of smoking, weight control (via diet and exercise), and continued surveillance/management of/for obstructive sleep apnea.    Medication Adjustments/Labs and Tests Ordered: Current medicines are reviewed at length with the patient today.  Concerns regarding medicines are outlined above.  No orders of the defined types were placed in this encounter.  No orders of the defined types were placed in this encounter.   Patient Instructions  Medication Instructions:  Your physician has recommended  you make the following change in your medication:   1) STOP niacin 2) STOP omega-3 fatty acid  *If you need a refill on your cardiac medications before your next appointment, please call your pharmacy*  Lab Work: NONE  Testing/Procedures: NONE  Follow-Up: At [facility], you and your health needs are our priority.  As part of our continuing mission to provide you with exceptional heart care, we have created designated Provider Care Teams.  These Care Teams include your primary Cardiologist (physician) and Advanced Practice Providers (APPs -  Physician Assistants and Nurse Practitioners) who all work together to provide you with the care you need, when you need it.  Your next appointment:   6-8 week(s) follow-up after cardioversion  The format for your next appointment:   In Person  Provider:   Christen Bame, NP  Important Information About Sugar         Signed, Ronald Grooms, MD  05/19/2022 12:42 PM    [place]

## 2022-05-19 NOTE — Patient Instructions (Addendum)
Description   Continue taking 1 tablet Daily, except 1/2 tablet Monday, Wednesday and Friday. INR in 1 week. Cardioversion 10/27 weekly INRs. Continue leafy veggies every 2-3 days.  Antico[facility] Clinic-[phone]

## 2022-05-19 NOTE — Progress Notes (Signed)
Cardiology Office Note:    Date:  05/19/2022   ID:  Ronald Duran, DOB Mar 13, 1941, MRN [id number]  PCP:  Ronald Huddle, MD  Cardiologist:  Ronald Grooms, MD   Referring MD: Ronald Huddle, MD   Chief Complaint  Patient presents with   Atrial Fibrillation   Coronary Artery Disease   Congestive Heart Failure    History of Present Illness:    Ronald Duran is a 81 y.o. 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   Ronald Duran is doing well.  He was incidentally found to have atrial fibrillation during his yearly exam with Dr. Mertha Duran.  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  Never before had atrial fibrillation been identified.  Clinically he is in atrial fibrillation today.  He still has typical physical activity endurance.  Did not notice any significant decrease in overall function and endurance.  He denies angina, orthopnea, PND.  No lower extremity swelling.  Electrical cardioversion has been sent for June 03, 2022 by Dr. Harrington Duran.  He will need to have follow-up a couple weeks after the cardioversion with EKG to ensure that he is still in rhythm.  If he has ERAF, he will need consideration for antiarrhythmic therapy and/or ablation.  Past Medical History:  Diagnosis Date   CAD (coronary artery disease)    a. s/p MI with LAD stent placement December, 1999 -last stress test  November 2008 with no EKG evidence of ischemia  b. cath 07/23/2015 after abnormal ETT and nuc, occluded mid LAD stent with good distal collateral, medical therapy    Cardiomyopathy ([place])    Ischemic cardiomyopathy with EF 40% by echo 2005   Dyslipidemia    Erectile dysfunction    Hiatal hernia    with esophageal strictures and dilatations in the past - Dr. Delfin Duran   History of nuclear stress test    Myoview 12/16: EF 47%, anterior, anteroseptal and inferoseptal defect suggesting infarct with peri-infarct ischemia, intermediate risk   HTN (hypertension)    Hx of seasonal allergies    Hyperlipidemia    PAD (peripheral artery disease) ([place])    Pre-diabetes    Prostate cancer ([place])    with seed implant therapy following Lupron therapy in 2008 and 2009 - followed by Dr. Risa Duran   Ringworm    Vitamin D deficiency     Past Surgical History:  Procedure Laterality Date   CARDIAC CATHETERIZATION N/A 07/23/2015   Procedure: Left Heart Cath and Coronary Angiography;  Surgeon: Ronald Crome, MD;  Location: [facility];  Service: Cardiovascular;  Laterality: N/A;   COLONOSCOPY WITH PROPOFOL N/A 11/24/2015   Procedure: COLONOSCOPY WITH PROPOFOL;  Surgeon: Ronald Fair, MD;  Location: [facility];  Service: Endoscopy;  Laterality: N/A;   esophageal strictures with dilatations in the past per Dr. Delfin Duran  last '98   HERNIA REPAIR Left    LAD stent placement - Dr. Daneen Duran     left inguinal hernia repair - Dr. Magdalene Duran     prostatic radioactive seed implantation - Dr. Valere Duran     '09    Current Medications: Current Meds  Medication Sig   amLODipine (NORVASC) 5 MG tablet Take 1 tablet (5 mg total) by mouth daily.   atorvastatin (LIPITOR) 40 MG tablet Take 40 mg by mouth.   B Complex Vitamins (B COMPLEX 100) tablet Take 1 tablet by mouth daily.   benazepril-hydrochlorthiazide (LOTENSIN HCT) 20-12.5 MG tablet Take 1 tablet by mouth daily.   cetirizine (ZYRTEC) 10 MG  tablet Take 10 mg by mouth daily.   Cholecalciferol (VITAMIN D3) 2000 UNITS TABS Take 1 tablet by mouth daily.    Coenzyme Q10 (CO Q-10) 100 MG CAPS Take 1 capsule by mouth daily.    ketoconazole (NIZORAL) 2 % cream as needed.   metoprolol succinate (TOPROL XL) 25 MG 24 hr tablet Take 1 tablet (25 mg total) by mouth daily.   Multiple Vitamins-Minerals (CENTRUM) tablet Take 1 tablet by mouth daily.   nitroGLYCERIN (NITROSTAT) 0.4 MG SL tablet PLACE 1 TABLET UNDER THE TONGUE IF NEEDED EVERY 5 MINUTES FOR CHEST PAIN FOR 3 DOSES IF NO RELIEF AFTER FIRST DOSE CALL 911.   warfarin (COUMADIN) 5 MG tablet take 1 tablet by mouth once daily or as directed   [DISCONTINUED] niacin (NIASPAN) 500 MG CR tablet Take 1,000 mg by mouth at bedtime.    [DISCONTINUED] Omega-3 Fatty Acids (OMEGA 3 PO) Take 2,400 mg by mouth daily.     Allergies:   Patient has no known allergies.   Social History   Socioeconomic History   Marital status: Married    Spouse name: Not on file   Number of children: Not on file   Years of education: Not on file   Highest education level: Not on file  Occupational History   Not on file  Tobacco Use   Smoking status: Never   Smokeless tobacco: Never  Vaping Use   Vaping Use: Never used  Substance and Sexual Activity   Alcohol use: Yes    Alcohol/week: 0.0 standard drinks of alcohol    Comment: occasionally for special occasions     Drug use: No   Sexual activity: Not on file  Other Topics Concern   Not on file  Social History Narrative   Not on file   Social Determinants of Health   Financial Resource Strain: Not on file  Food Insecurity: Not on file  Transportation Needs: Not on file  Physical Activity: Not on file  Stress: Not on file  Social Connections: Not on file     Family History: The patient's family history includes CAD in his sister; CVA in his mother; CVA (age of onset: 82) in his father.  ROS:   Please see the history of present illness.    We  answered questions about the planned upcoming cardioversion on October 27th.  All other systems reviewed and are negative.  EKGs/Labs/Other Studies Reviewed:    The following studies were reviewed today: Echo 04/2017 IMPRESSIONS   Normal global left ventricular size with reduced  systolic function. Akinetic apex. Appears to be thrombus in apex, measuring   approximately 1.4 cm by 1.4 cm. Mild left ventricular hypertrophy. Mildly abnormal left ventricular ejection fraction estimated at   40-45%. Septal E/E' ratio is 11.1 indicating normal filling pressure. Abnormal  relaxation filling pattern of the left ventricle for age   (stage 1 diastolic dysfunction). No previous echo for comparison.    An interatrial shunt was not identified with and without Valsalva.   Left atrial dilatation.    Mitral annular calcification. Mild thickening/calcification of the mitral valve leaflets. Trace to mild mitral regurgitation.   Aortic valve appears to be sclerotic without evidence of stenosis. No significant aortic regurgitation noted.   Trace tricuspid regurgitation. Unable to estimate PASP.    ECHOCARDIOGRAM 10/21/2021 IMPRESSIONS   1. Left ventricular ejection fraction, by estimation, is 45 to 50%. The  left ventricle has mildly decreased function. The left ventricle  demonstrates regional wall motion abnormalities (see scoring  diagram/findings for description). Left ventricular  diastolic parameters are indeterminate. There is moderate hypokinesis of  the left ventricular, mid-apical inferior wall. There is of the left  ventricular, apical apical segment.   2. Right ventricular systolic function is normal. The right ventricular  size is normal. There is normal pulmonary artery systolic pressure.   3. Left atrial size was moderately dilated.   4. Right atrial size was mild to moderately dilated.   5. The mitral valve is normal in structure. Trivial mitral valve  regurgitation. No evidence of mitral  stenosis.   6. The aortic valve is tricuspid. There is mild calcification of the  aortic valve. Aortic valve regurgitation is trivial. Aortic valve  sclerosis is present, with no evidence of aortic valve stenosis.   7. The inferior vena cava is normal in size with greater than 50%  respiratory variability, suggesting right atrial pressure of 3 mmHg. EKG:  EKG not repeated at  Recent Labs: No results found for requested labs within last 365 days.  Recent Lipid Panel No results found for: "CHOL", "TRIG", "HDL", "CHOLHDL", "VLDL", "LDLCALC", "LDLDIRECT"  Physical Exam:    VS:  BP 132/76   Pulse 87   Ht 6' (1.829 m)   Wt 194 lb 3.2 oz (88.1 kg)   SpO2 96%   BMI 26.34 kg/m     Wt Readings from Last 3 Encounters:  05/19/22 194 lb 3.2 oz (88.1 kg)  05/10/22 194 lb 12.8 oz (88.4 kg)  10/04/21 193 lb 6.4 oz (87.7 kg)     GEN: Healthy appearing. No acute distress HEENT: Normal NECK: No JVD. LYMPHATICS: No lymphadenopathy CARDIAC: No murmur. IIRR no gallop, or edema. VASCULAR:  Normal Pulses. No bruits. RESPIRATORY:  Clear to auscultation without rales, wheezing or rhonchi  ABDOMEN: Soft, non-tender, non-distended, No pulsatile mass, MUSCULOSKELETAL: No deformity  SKIN: Warm and dry NEUROLOGIC:  Alert and oriented x 3 PSYCHIATRIC:  Normal affect   ASSESSMENT:    1. Persistent atrial fibrillation ([place])   2. Long term current use of therapeutic drug   3. Cerebrovascular accident (CVA) due to embolism of other cerebral artery ([place])   4. Coronary artery disease involving native coronary artery of native heart without angina pectoris   5. Chronic combined systolic and diastolic heart failure, NYHA class 1 ([place])   6. Essential hypertension    PLAN:    In order of problems listed above:  Duration is unknown.  Moderate left atrial enlargement by echo 9 months ago.  He has been set up for electrical cardioversion since he already has mild reduction in LV systolic function dating  back to his myocardial infarction greater than 23 years ago.  Excepting persistent atrial fibrillation will cause downstream likelihood of progressive LV and RV dysfunction due to mitral and tricuspid  valve dysfunction.  He is very active.  His long-term survivability over the next 10 years should be relatively good. Continue Coumadin Did not discuss Continue secondary prevention with aggressive lipid-lowering.  He is on over-the-counter omega-3 fatty acids and over-the-counter niacin, neither of which is necessarily helpful for him.  His triglyceride is 91.  Suggested that he discontinue both of those but continue his statin.  Have a lipid panel done when he sees his new primary physician in November.  If triglyceride is less than 135, I do not believe any specific therapy for triglyceride therapy will be needed. Though his LV function has been stable for 20 years in the 40 to 45% range, it makes sense to consider recalibrating his heart failure therapy to include an ARNI/SGLT2/an MRA if blood pressure tolerates.  He has significant sinus bradycardia at rest on low-dose metoprolol so this can probably not be further titrated. Blood pressure is well controlled  Overall education and awareness concerning primary/secondary risk prevention was discussed in detail: LDL less than 70, hemoglobin A1c less than 7, blood pressure target less than 130/80 mmHg, >150 minutes of moderate aerobic activity per week, avoidance of smoking, weight control (via diet and exercise), and continued surveillance/management of/for obstructive sleep apnea.    Medication Adjustments/Labs and Tests Ordered: Current medicines are reviewed at length with the patient today.  Concerns regarding medicines are outlined above.  No orders of the defined types were placed in this encounter.  No orders of the defined types were placed in this encounter.   Patient Instructions  Medication Instructions:  Your physician has recommended  you make the following change in your medication:   1) STOP niacin 2) STOP omega-3 fatty acid  *If you need a refill on your cardiac medications before your next appointment, please call your pharmacy*  Lab Work: NONE  Testing/Procedures: NONE  Follow-Up: At [facility], you and your health needs are our priority.  As part of our continuing mission to provide you with exceptional heart care, we have created designated Provider Care Teams.  These Care Teams include your primary Cardiologist (physician) and Advanced Practice Providers (APPs -  Physician Assistants and Nurse Practitioners) who all work together to provide you with the care you need, when you need it.  Your next appointment:   6-8 week(s) follow-up after cardioversion  The format for your next appointment:   In Person  Provider:   Christen Bame, NP  Important Information About Sugar         Signed, Ronald Grooms, MD  05/19/2022 12:42 PM    [place]

## 2022-05-26 ENCOUNTER — Ambulatory Visit: Payer: Medicare Other | Attending: Interventional Cardiology

## 2022-05-26 DIAGNOSIS — I4819 Other persistent atrial fibrillation: Secondary | ICD-10-CM | POA: Diagnosis not present

## 2022-05-26 DIAGNOSIS — I6349 Cerebral infarction due to embolism of other cerebral artery: Secondary | ICD-10-CM

## 2022-05-26 DIAGNOSIS — Z7901 Long term (current) use of anticoagulants: Secondary | ICD-10-CM | POA: Diagnosis not present

## 2022-05-26 DIAGNOSIS — Z79899 Other long term (current) drug therapy: Secondary | ICD-10-CM

## 2022-05-26 LAB — POCT INR: INR: 3.8 — AB (ref 2.0–3.0)

## 2022-05-26 NOTE — Patient Instructions (Signed)
Description   Only take 0.5 tablet today and then continue taking 1 tablet Daily, except 1/2 tablet Monday, Wednesday and Friday. INR in 1 week. Cardioversion 10/27 weekly INRs. Continue leafy veggies every 2-3 days.  Anticoagulation Clinic-[phone]

## 2022-05-27 LAB — CBC
Hematocrit: 49 % (ref 37.5–51.0)
Hemoglobin: 17.3 g/dL (ref 13.0–17.7)
MCH: 33.4 pg — ABNORMAL HIGH (ref 26.6–33.0)
MCHC: 35.3 g/dL (ref 31.5–35.7)
MCV: 95 fL (ref 79–97)
Platelets: 138 10*3/uL — ABNORMAL LOW (ref 150–450)
RBC: 5.18 x10E6/uL (ref 4.14–5.80)
RDW: 12.5 % (ref 11.6–15.4)
WBC: 8 10*3/uL (ref 3.4–10.8)

## 2022-05-27 LAB — BASIC METABOLIC PANEL
BUN/Creatinine Ratio: 15 (ref 10–24)
BUN: 17 mg/dL (ref 8–27)
CO2: 26 mmol/L (ref 20–29)
Calcium: 9.7 mg/dL (ref 8.6–10.2)
Chloride: 102 mmol/L (ref 96–106)
Creatinine, Ser: 1.12 mg/dL (ref 0.76–1.27)
Glucose: 97 mg/dL (ref 70–99)
Potassium: 4.1 mmol/L (ref 3.5–5.2)
Sodium: 143 mmol/L (ref 134–144)
eGFR: 67 mL/min/{1.73_m2} (ref >59)

## 2022-06-02 ENCOUNTER — Ambulatory Visit: Payer: Medicare Other | Attending: Interventional Cardiology | Admitting: *Deleted

## 2022-06-02 DIAGNOSIS — Z79899 Other long term (current) drug therapy: Secondary | ICD-10-CM

## 2022-06-02 DIAGNOSIS — I4819 Other persistent atrial fibrillation: Secondary | ICD-10-CM | POA: Diagnosis not present

## 2022-06-02 DIAGNOSIS — Z7901 Long term (current) use of anticoagulants: Secondary | ICD-10-CM | POA: Diagnosis not present

## 2022-06-02 DIAGNOSIS — I6349 Cerebral infarction due to embolism of other cerebral artery: Secondary | ICD-10-CM | POA: Diagnosis not present

## 2022-06-02 LAB — POCT INR: INR: 2.5 (ref 2.0–3.0)

## 2022-06-02 NOTE — Patient Instructions (Signed)
Description   Continue taking 1 tablet daily, except 1/2 tablet Monday, Wednesday and Friday. INR in 1 week. Cardioversion 10/27 weekly INRs. Continue leafy veggies every 2-3 days.  Anticoagulation Clinic-[phone]

## 2022-06-03 ENCOUNTER — Encounter (HOSPITAL_COMMUNITY): Payer: Self-pay | Admitting: Internal Medicine

## 2022-06-03 ENCOUNTER — Ambulatory Visit (HOSPITAL_COMMUNITY): Payer: Medicare Other | Admitting: General Practice

## 2022-06-03 ENCOUNTER — Encounter: Payer: Self-pay | Admitting: Interventional Cardiology

## 2022-06-03 ENCOUNTER — Other Ambulatory Visit: Payer: Self-pay

## 2022-06-03 ENCOUNTER — Ambulatory Visit (HOSPITAL_BASED_OUTPATIENT_CLINIC_OR_DEPARTMENT_OTHER): Payer: Medicare Other | Admitting: General Practice

## 2022-06-03 ENCOUNTER — Ambulatory Visit (HOSPITAL_COMMUNITY)
Admission: RE | Admit: 2022-06-03 | Discharge: 2022-06-03 | Disposition: A | Discharge status: Home / Self Care | Payer: Medicare Other | Source: Ambulatory Visit | Attending: Internal Medicine | Admitting: Internal Medicine

## 2022-06-03 ENCOUNTER — Encounter (HOSPITAL_COMMUNITY)
Admission: RE | Disposition: A | Discharge status: Home / Self Care | Payer: Self-pay | Source: Ambulatory Visit | Attending: Internal Medicine

## 2022-06-03 DIAGNOSIS — K449 Diaphragmatic hernia without obstruction or gangrene: Secondary | ICD-10-CM | POA: Diagnosis not present

## 2022-06-03 DIAGNOSIS — I2510 Atherosclerotic heart disease of native coronary artery without angina pectoris: Secondary | ICD-10-CM | POA: Insufficient documentation

## 2022-06-03 DIAGNOSIS — I4819 Other persistent atrial fibrillation: Secondary | ICD-10-CM

## 2022-06-03 DIAGNOSIS — I252 Old myocardial infarction: Secondary | ICD-10-CM | POA: Diagnosis not present

## 2022-06-03 DIAGNOSIS — E785 Hyperlipidemia, unspecified: Secondary | ICD-10-CM | POA: Insufficient documentation

## 2022-06-03 DIAGNOSIS — I739 Peripheral vascular disease, unspecified: Secondary | ICD-10-CM | POA: Insufficient documentation

## 2022-06-03 DIAGNOSIS — R7303 Prediabetes: Secondary | ICD-10-CM | POA: Insufficient documentation

## 2022-06-03 DIAGNOSIS — Z7901 Long term (current) use of anticoagulants: Secondary | ICD-10-CM | POA: Insufficient documentation

## 2022-06-03 DIAGNOSIS — I10 Essential (primary) hypertension: Secondary | ICD-10-CM | POA: Diagnosis not present

## 2022-06-03 DIAGNOSIS — I4891 Unspecified atrial fibrillation: Secondary | ICD-10-CM | POA: Diagnosis present

## 2022-06-03 DIAGNOSIS — Z8673 Personal history of transient ischemic attack (TIA), and cerebral infarction without residual deficits: Secondary | ICD-10-CM | POA: Diagnosis not present

## 2022-06-03 HISTORY — PX: CARDIOVERSION: SHX1299

## 2022-06-03 SURGERY — CARDIOVERSION
Anesthesia: General

## 2022-06-03 MED ORDER — SODIUM CHLORIDE 0.9 % IV SOLN: INTRAVENOUS | Status: DC

## 2022-06-03 MED ORDER — LIDOCAINE 2% (20 MG/ML) 5 ML SYRINGE
INTRAMUSCULAR | Status: DC | PRN
  Administered 2022-06-03: 80 mg via INTRAVENOUS

## 2022-06-03 MED ORDER — PROPOFOL 10 MG/ML IV BOLUS
INTRAVENOUS | Status: DC | PRN
  Administered 2022-06-03: 60 mg via INTRAVENOUS
  Administered 2022-06-03 (×2): 20 mg via INTRAVENOUS

## 2022-06-03 NOTE — Discharge Instructions (Signed)

## 2022-06-03 NOTE — Anesthesia Preprocedure Evaluation (Signed)
Anesthesia Evaluation  Patient identified by MRN, date of birth, ID band Patient awake    Reviewed: Allergy & Precautions, NPO status , Patient's Chart, lab work & pertinent test results  Airway Mallampati: II  TM Distance: >3 FB Neck ROM: Full    Dental  (+) Dental Advisory Given, Chipped   Pulmonary neg pulmonary ROS,    Pulmonary exam normal breath sounds clear to auscultation       Cardiovascular hypertension, Pt. on medications and Pt. on home beta blockers + CAD, + Past MI and + Peripheral Vascular Disease   Rhythm:Irregular Rate:Normal  Echo 10/2021 1. Left ventricular ejection fraction, by estimation, is 45 to 50%. The left ventricle has mildly decreased function. The left ventricle demonstrates regional wall motion abnormalities (see scoring diagram/findings for description). Left ventricular diastolic parameters are indeterminate. There is moderate hypokinesis of  the left ventricular, mid-apical inferior wall. There is of the left ventricular, apical apical segment.  2. Right ventricular systolic function is normal. The right ventricular size is normal. There is normal pulmonary artery systolic pressure.  3. Left atrial size was moderately dilated.  4. Right atrial size was mild to moderately dilated.  5. The mitral valve is normal in structure. Trivial mitral valve regurgitation. No evidence of mitral stenosis.  6. The aortic valve is tricuspid. There is mild calcification of the aortic valve. Aortic valve regurgitation is trivial. Aortic valve sclerosis is present, with no evidence of aortic valve stenosis.  7. The inferior vena cava is normal in size with greater than 50% respiratory variability, suggesting right atrial pressure of 3 mmHg.   Comparison(s): Prior images unable to be directly viewed, comparison made by report only.    Neuro/Psych  Neuromuscular disease negative psych ROS   GI/Hepatic Neg liver ROS,  hiatal hernia,   Endo/Other  negative endocrine ROS  Renal/GU negative Renal ROS  negative genitourinary   Musculoskeletal negative musculoskeletal ROS (+)   Abdominal   Peds negative pediatric ROS (+)  Hematology negative hematology ROS (+)   Anesthesia Other Findings   Reproductive/Obstetrics negative OB ROS                             Anesthesia Physical  Anesthesia Plan  ASA: 3  Anesthesia Plan: General   Post-op Pain Management: Minimal or no pain anticipated   Induction: Intravenous  PONV Risk Score and Plan: 1 and Propofol infusion, TIVA and Treatment may vary due to age or medical condition  Airway Management Planned: Natural Airway and Mask  Additional Equipment:   Intra-op Plan:   Post-operative Plan:   Informed Consent: I have reviewed the patients History and Physical, chart, labs and discussed the procedure including the risks, benefits and alternatives for the proposed anesthesia with the patient or authorized representative who has indicated his/her understanding and acceptance.     Dental advisory given  Plan Discussed with: CRNA  Anesthesia Plan Comments:         Anesthesia Quick Evaluation

## 2022-06-03 NOTE — Transfer of Care (Signed)
Immediate Anesthesia Transfer of Care Note  Patient: Ronald Duran  Procedure(s) Performed: CARDIOVERSION  Patient Location: Endoscopy Unit  Anesthesia Type:General  Level of Consciousness: awake and drowsy  Airway & Oxygen Therapy: Patient Spontanous Breathing  Post-op Assessment: Report given to RN and Post -op Vital signs reviewed and stable  Post vital signs: Reviewed and stable  Last Vitals:  Vitals Value Taken Time  BP 106/68 (81)   Temp    Pulse 82   Resp 14   SpO2 93     Last Pain:  Vitals:   06/03/22 0901  PainSc: 0-No pain         Complications: No notable events documented.

## 2022-06-03 NOTE — Anesthesia Procedure Notes (Signed)
Procedure Name: General with mask airway Date/Time: 06/03/2022 9:16 AM  Performed by: Dorann Lodge, CRNAPre-anesthesia Checklist: Patient identified, Emergency Drugs available, Suction available and Patient being monitored Patient Re-evaluated:Patient Re-evaluated prior to induction Oxygen Delivery Method: Ambu bag Preoxygenation: Pre-oxygenation with 100% oxygen Induction Type: IV induction Dental Injury: Teeth and Oropharynx as per pre-operative assessment

## 2022-06-03 NOTE — Interval H&P Note (Signed)
History and Physical Interval Note:  06/03/2022 9:12 AM  Ronald Duran  has presented today for surgery, with the diagnosis of AFIB.  The various methods of treatment have been discussed with the patient and family. After consideration of risks, benefits and other options for treatment, the patient has consented to  Procedure(s): CARDIOVERSION (N/A) as a surgical intervention.  The patient's history has been reviewed, patient examined, no change in status, stable for surgery.  I have reviewed the patient's chart and labs.  Questions were answered to the patient's satisfaction.     Dorris Carnes

## 2022-06-03 NOTE — Anesthesia Postprocedure Evaluation (Signed)
Anesthesia Post Note  Patient: Ronald Duran  Procedure(s) Performed: CARDIOVERSION     Patient location during evaluation: Endoscopy Anesthesia Type: General Level of consciousness: awake and alert Pain management: pain level controlled Vital Signs Assessment: post-procedure vital signs reviewed and stable Respiratory status: spontaneous breathing, nonlabored ventilation, respiratory function stable and patient connected to nasal cannula oxygen Cardiovascular status: blood pressure returned to baseline and stable Postop Assessment: no apparent nausea or vomiting Anesthetic complications: no   No notable events documented.  Last Vitals:  Vitals:   06/03/22 0950 06/03/22 1000  BP: (!) 115/90 135/89  Pulse: 76 73  Resp: 16 17  Temp:    SpO2: 95% 98%    Last Pain:  Vitals:   06/03/22 1000  TempSrc:   PainSc: 0-No pain                 Nur Krasinski S

## 2022-06-03 NOTE — CV Procedure (Signed)
CARDIOVERSION  Indication:  Atrial fibrillation  Patient sedated by anesthesia using 80 mg lidocaine and 100 mg propofol intravenously     WIth pads in AP position, patient cardioverted to SR with 200 J synchronized biphasic energy.   Patient reverted after several seconds to SR  A second cardioversion performed since still anesthetized.  200 J synchronized biphasic energy again used.   Pt converted to SR but within minute was back in atrial fibrillation   Procedures were without complication.  Dorris Carnes MD

## 2022-06-05 ENCOUNTER — Encounter (HOSPITAL_COMMUNITY): Payer: Self-pay | Admitting: Internal Medicine

## 2022-06-06 NOTE — Telephone Encounter (Signed)
Called and spoke with patient to discuss Dr. Thompson Caul recommendations based on cardioversion report.  Per Dr. Tamala Julian: Needs referral to EP for consideration of advanced therapy such as ablation +/- antiarrhythmic therapy.   Patient would like to have consultation with EP physician to discuss ablation/antiarrhythmic therapy.  Order for EP referral placed, scheduler to call and setup appt for patient.  Patient verbalized understanding and expressed appreciation for call.

## 2022-06-08 ENCOUNTER — Ambulatory Visit: Payer: Medicare Other | Attending: Cardiology

## 2022-06-08 DIAGNOSIS — Z79899 Other long term (current) drug therapy: Secondary | ICD-10-CM | POA: Diagnosis not present

## 2022-06-08 DIAGNOSIS — I4819 Other persistent atrial fibrillation: Secondary | ICD-10-CM | POA: Diagnosis not present

## 2022-06-08 DIAGNOSIS — Z7901 Long term (current) use of anticoagulants: Secondary | ICD-10-CM

## 2022-06-08 DIAGNOSIS — I6349 Cerebral infarction due to embolism of other cerebral artery: Secondary | ICD-10-CM | POA: Diagnosis not present

## 2022-06-08 LAB — POCT INR: INR: 2.7 (ref 2.0–3.0)

## 2022-06-08 NOTE — Patient Instructions (Signed)
Description   Continue taking 1 tablet daily, except 1/2 tablet Monday, Wednesday and Friday.  Recheck INR in 2 weeks.  Continue leafy veggies every 2-3 days.  Anticoagulation Clinic-[phone]

## 2022-06-23 ENCOUNTER — Other Ambulatory Visit: Payer: Self-pay

## 2022-06-23 ENCOUNTER — Ambulatory Visit: Payer: Medicare Other | Attending: Interventional Cardiology

## 2022-06-23 DIAGNOSIS — I4819 Other persistent atrial fibrillation: Secondary | ICD-10-CM

## 2022-06-23 DIAGNOSIS — Z7901 Long term (current) use of anticoagulants: Secondary | ICD-10-CM

## 2022-06-23 DIAGNOSIS — I6349 Cerebral infarction due to embolism of other cerebral artery: Secondary | ICD-10-CM | POA: Diagnosis not present

## 2022-06-23 DIAGNOSIS — Z79899 Other long term (current) drug therapy: Secondary | ICD-10-CM

## 2022-06-23 LAB — POCT INR: INR: 2 (ref 2.0–3.0)

## 2022-06-23 MED ORDER — WARFARIN SODIUM 5 MG PO TABS: ORAL_TABLET | ORAL | 0 refills | Status: DC

## 2022-06-23 NOTE — Patient Instructions (Addendum)
Description   Take 1.5 tablets today and then continue taking 1 tablet daily, except 1/2 tablet Monday, Wednesday and Friday.  Recheck INR in 3 weeks at [facility] - Confirmed with PharmD  Continue leafy veggies every 2-3 days.  Anticoagulation Clinic-[phone]

## 2022-06-23 NOTE — Telephone Encounter (Signed)
Prescription refill request received for warfarin Lov: 05/19/22 Tamala Julian)  Next INR check: 07/14/22 Warfarin tablet strength: '5mg'$   Appropriate dose and refill sent to requested pharmacy.

## 2022-06-28 ENCOUNTER — Other Ambulatory Visit: Payer: Self-pay | Admitting: Gastroenterology

## 2022-06-28 DIAGNOSIS — R1310 Dysphagia, unspecified: Secondary | ICD-10-CM

## 2022-06-28 DIAGNOSIS — K449 Diaphragmatic hernia without obstruction or gangrene: Secondary | ICD-10-CM

## 2022-06-29 ENCOUNTER — Telehealth: Payer: Self-pay

## 2022-06-29 NOTE — Telephone Encounter (Signed)
..     Pre-operative Risk Assessment    Patient Name: Ronald Duran  DOB: 08-Jun-1941 MRN: [id number]      Request for Surgical Clearance    Procedure:   VITRECTOMY 25 GAUGE MEMBRANE LEFT EYE  Date of Surgery:  Clearance TBD                                 Surgeon:  DR Thompson Caul Surgeon's Group or Practice Name:  [facility] Phone number:  [phone] Fax number:  [phone]   Type of Clearance Requested:   - Medical  - Pharmacy:  Hold Aspirin     Type of Anesthesia:  MAC/local   Additional requests/questions:    Gwenlyn Found   06/29/2022, 12:48 PM

## 2022-07-04 NOTE — Telephone Encounter (Signed)
Clearance request to hold ASA, however patient is on Coumadin. He underwent unsuccessful DCCV on 06/03/2022. He can interrupt Coumadin as of 07/04/2022. Will route to Pharm D regarding holding coumadin.   Pre-op callback, will you find out procedure date?  He has an upcoming appointment with [name] on 07/14/2022. If procedure is after this, can defer decision to [name].

## 2022-07-04 NOTE — Telephone Encounter (Signed)
Patient with diagnosis of afib and stroke on warfarin for anticoagulation.    Procedure: VITRECTOMY 25 GAUGE MEMBRANE LEFT EYE  Date of procedure: TBD  CHA2DS2-VASc Score = 7  This indicates a 11.2% annual risk of stroke. The patient's score is based upon: CHF History: 1 HTN History: 1 Diabetes History: 0 Stroke History: 2 Vascular Disease History: 1 Age Score: 2 Gender Score: 0   CrCl 41m/min Platelet count 138K  Pt has EP appt on 12/4 to discuss potential ablation/antiarrhythmics. Will defer clearance rec until after this visit as treatment plan would affect clearance rec. Also has APP appt on 12/7 - can preop clarify if this is still needed since he has 12/4 MD appt as well? Will have INR checked at upcoming appt - PharmD can [place] (previously on warfarin for stroke, since then afib has been detected so DOAC use is now on label and would avoid need for Lovenox bridging when he interrupts his anticoagulation, anticipated copay $47/month).  **This guidance is not considered finalized until pre-operative APP has relayed final recommendations.**

## 2022-07-05 ENCOUNTER — Encounter: Payer: Self-pay | Admitting: Cardiovascular Disease

## 2022-07-05 ENCOUNTER — Other Ambulatory Visit: Payer: Self-pay

## 2022-07-05 ENCOUNTER — Ambulatory Visit: Payer: Medicare Other | Attending: Cardiovascular Disease | Admitting: Cardiovascular Disease

## 2022-07-05 VITALS — BP 132/78 | HR 102 | Ht 72.0 in | Wt 198.0 lb

## 2022-07-05 DIAGNOSIS — I4819 Other persistent atrial fibrillation: Secondary | ICD-10-CM | POA: Diagnosis not present

## 2022-07-05 NOTE — Progress Notes (Signed)
Electrophysiology Office Note:    Date:  07/05/2022   ID:  Ronald Duran, DOB September 27, 1940, MRN [id number]  PCP:  Josetta Huddle, MD   [place] [facility] Cardiologist:  Sinclair Grooms, MD     Referring MD: Belva Crome, MD    History of Present Illness:    Ronald Duran is a 81 y.o. 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  AF was detected incidentally in September 2023 by his PCP. The previous normal exam was in May. The patient has not noticed any palpitations or change in his exercise tolerance. He had a cardioversion but quickly reverted back to AF.  He was referred to consider AF ablation and rhythm control due to a history of cardiomyopathy.  Today, he denies any significant dyspnea, chest pain, palpitations.    Past Medical History:  Diagnosis Date   CAD (coronary artery disease)    a. s/p MI with LAD stent placement December, 1999 -last stress test November 2008 with no EKG evidence of ischemia  b. cath 07/23/2015 after abnormal ETT and nuc, occluded mid LAD stent with good distal collateral, medical therapy    Cardiomyopathy ([place])    Ischemic cardiomyopathy with EF 40% by echo 2005   Dyslipidemia    Erectile dysfunction    Hiatal hernia    with esophageal strictures and dilatations in the past - Dr. Delfin Edis   History of nuclear stress test    Myoview 12/16: EF 47%, anterior, anteroseptal and inferoseptal defect suggesting infarct with peri-infarct ischemia, intermediate risk   HTN (hypertension)    Hx of seasonal allergies    Hyperlipidemia    PAD (peripheral artery disease) ([place])    Pre-diabetes    Prostate cancer ([place])    with seed implant therapy following Lupron therapy in 2008 and 2009 - followed by [name]   Ringworm    Vitamin D deficiency     Past Surgical History:  Procedure Laterality Date   CARDIAC CATHETERIZATION N/A  07/23/2015   Procedure: Left Heart Cath and Coronary Angiography;  Surgeon: Belva Crome, MD;  Location: [facility];  Service: Cardiovascular;  Laterality: N/A;   CARDIOVERSION N/A 06/03/2022   Procedure: CARDIOVERSION;  Surgeon: Fay Records, MD;  Location: [facility];  Service: Cardiovascular;  Laterality: N/A;   COLONOSCOPY WITH PROPOFOL N/A 11/24/2015   Procedure: COLONOSCOPY WITH PROPOFOL;  Surgeon: Garlan Fair, MD;  Location: [facility];  Service: Endoscopy;  Laterality: N/A;   esophageal strictures with dilatations in the past per Dr. Delfin Edis     last '98   HERNIA REPAIR Left    LAD stent placement - Dr. Daneen Schick     left inguinal hernia repair - Dr. Magdalene River     prostatic radioactive seed implantation - Dr. Valere Dross     '09    Current Medications: Current Meds  Medication Sig   amLODipine (NORVASC) 5 MG tablet Take 1 tablet (5 mg total) by mouth daily.   atorvastatin (LIPITOR) 40 MG tablet Take 40 mg by mouth at bedtime.   B Complex Vitamins (B COMPLEX 100) tablet Take 1 tablet by mouth daily. Balance   benazepril-hydrochlorthiazide (LOTENSIN HCT) 20-12.5 MG tablet Take 1 tablet by mouth daily.   cetirizine (ZYRTEC) 10 MG tablet Take 10 mg by mouth daily.   Cholecalciferol (VITAMIN D3) 2000 UNITS TABS Take 2,000 Units by mouth  daily.   Coenzyme Q10 (CO Q-10) 100 MG CAPS Take 100 mg by mouth daily with supper.   metoprolol succinate (TOPROL XL) 25 MG 24 hr tablet Take 1 tablet (25 mg total) by mouth daily.   Multiple Vitamins-Minerals (CENTRUM) tablet Take 1 tablet by mouth daily.   nitroGLYCERIN (NITROSTAT) 0.4 MG SL tablet PLACE 1 TABLET UNDER THE TONGUE IF NEEDED EVERY 5 MINUTES FOR CHEST PAIN FOR 3 DOSES IF NO RELIEF AFTER FIRST DOSE CALL 911.   omeprazole (PRILOSEC) 20 MG capsule Take 20 mg by mouth every morning.   warfarin (COUMADIN) 5 MG tablet TAKE 1/2 TABLET TO 1 TABLET BY MOUTH DAILY AS DIRECTED BY COUMADIN CLINIC     Allergies:   Patient  has no known allergies.   Social History   Socioeconomic History   Marital status: Married    Spouse name: Not on file   Number of children: Not on file   Years of education: Not on file   Highest education level: Not on file  Occupational History   Not on file  Tobacco Use   Smoking status: Never   Smokeless tobacco: Never  Vaping Use   Vaping Use: Never used  Substance and Sexual Activity   Alcohol use: Yes    Alcohol/week: 0.0 standard drinks of alcohol    Comment: occasionally for special occasions     Drug use: No   Sexual activity: Not on file  Other Topics Concern   Not on file  Social History Narrative   Not on file   Social Determinants of Health   Financial Resource Strain: Not on file  Food Insecurity: Not on file  Transportation Needs: Not on file  Physical Activity: Not on file  Stress: Not on file  Social Connections: Not on file     Family History: The patient's family history includes CAD in his sister; CVA in his mother; CVA (age of onset: 52) in his father.  ROS:   Please see the history of present illness.    All other systems reviewed and are negative.  EKGs/Labs/Other Studies Reviewed Today:     TTE 10/21/2021 EF 45-50%, improved from 40% in 2018  EKG:  Last EKG results: today - AF, rightward axis   Recent Labs: 05/26/2022: BUN 17; Creatinine, Ser 1.12; Hemoglobin 17.3; Platelets 138; Potassium 4.1; Sodium 143     Physical Exam:    VS:  BP 132/78   Pulse (!) 102   Ht 6' (1.829 m)   Wt 198 lb (89.8 kg)   SpO2 98%   BMI 26.85 kg/m     Wt Readings from Last 3 Encounters:  07/05/22 198 lb (89.8 kg)  06/03/22 190 lb (86.2 kg)  05/19/22 194 lb 3.2 oz (88.1 kg)     GEN: Well nourished, well developed in no acute distress CARDIAC: Irregular rhythm, no murmurs, rubs, gallops RESPIRATORY:  Normal work of breathing MUSCULOSKELETAL: no edema    ASSESSMENT & PLAN:    Persistent atrial fibrillation: mild symptoms, but with  history of cardiomyopathy, I think he would benefit from rhythm control to prevent progression of his cardiac disease. We discussed the indication, rationale, logistics, anticipated benefits, and potential risks of the ablation procedure including but not limited to -- bleed at the groin access site, chest pain, damage to nearby organs such as the diaphragm, lungs, or esophagus, need for a drainage tube, or prolonged hospitalization. I explained that the risk for stroke, heart attack, need for open chest surgery, or  even death is very low but not zero. he  expressed understanding and wishes to proceed. Secondary hypercoagulable state: Continue warfarin given data from FRAIL-AF. He is not particularly frail, however, so DOAC could be considered if he has issues with warfarin. Warfarin should not be interrupted for the ablation or for at least 30 days after. Cardiomyopathy: improved. CAD Sinus bradycardia: may limit medical management of AF        Medication Adjustments/Labs and Tests Ordered: Current medicines are reviewed at length with the patient today.  Concerns regarding medicines are outlined above.  Orders Placed This Encounter  Procedures   CT CARDIAC MORPH/PULM VEIN W/CM&W/O CA SCORE   Basic metabolic panel   CBC with Differential/Platelet   EKG 12-Lead   No orders of the defined types were placed in this encounter.    Signed, Melida Quitter, MD  07/05/2022 4:53 PM    [place]

## 2022-07-05 NOTE — Patient Instructions (Signed)
Medication Instructions:  Your physician recommends that you continue on your current medications as directed. Please refer to the Current Medication list given to you today.  *If you need a refill on your cardiac medications before your next appointment, please call your pharmacy*  Lab Work: BMET and CBC If you have labs (blood work) drawn today and your tests are completely normal, you will receive your results only by: [place] (if you have MyChart) OR A paper copy in the mail If you have any lab test that is abnormal or we need to change your treatment, we will call you to review the results.  Testing/Procedures: Your physician has requested that you have cardiac CT. Cardiac computed tomography (CT) is a painless test that uses an x-ray machine to take clear, detailed pictures of your heart. For further information please visit [facility]. Please follow instruction sheet as given.  Your physician has recommended that you have an ablation. Catheter ablation is a medical procedure used to treat some cardiac arrhythmias (irregular heartbeats). During catheter ablation, a long, thin, flexible tube is put into a blood vessel in your groin (upper thigh), or neck. This tube is called an ablation catheter. It is then guided to your heart through the blood vessel. Radio frequency waves destroy small areas of heart tissue where abnormal heartbeats may cause an arrhythmia to start. Please see the instruction sheet given to you today.   Follow-Up: At [facility], you and your health needs are our priority.  As part of our continuing mission to provide you with exceptional heart care, we have created designated Provider Care Teams.  These Care Teams include your primary Cardiologist (physician) and Advanced Practice Providers (APPs -  Physician Assistants and Nurse Practitioners) who all work together to provide you with the care you need, when you need it.  Your next  appointment:   See instruction letter  Important Information About Sugar

## 2022-07-05 NOTE — Telephone Encounter (Signed)
I did d/w Christen Bame, NP in regard to if appt with is still needed. Per NP: would ask preop to look at it, its pretty involved and I think we need to clarify if he is going to interrupt warfarin or doac for the eye surgery    I will forward to the pre op pool for review per NP.

## 2022-07-06 ENCOUNTER — Telehealth: Payer: Self-pay | Admitting: *Deleted

## 2022-07-06 NOTE — Telephone Encounter (Signed)
Seen by Dr Myles Gip with mild symptoms related to atrial fibrillation but no chest pain nor dyspnea. Need to determine what timing acceptable for holding Warfarin given his atrial fibrillation ablation scheduled for 09/01/22.  Keep apt with Ronald Bame, NP as Dr. Tamala Julian suggested titration of HF medications which would be addressed by general cardiology, not EP team. _________  [name] - Pt pending vitrectomy. Date TBD based on clearance. Given his atrial fibrillation ablation upcoming 09/01/22 - what time period does he need to have uninterrupted anticoagulation prior to ablation? Please route response to [facility] PREOP.  Ronald Dubonnet, NP

## 2022-07-06 NOTE — Telephone Encounter (Signed)
Placed a note on the pt's next appt that pt is pending Afib Ablation on 09/01/22 and needs weekly checks.

## 2022-07-06 NOTE — Telephone Encounter (Signed)
-----   Message from Bernestine Amass, RN sent at 07/05/2022  5:14 PM EST ----- Patient is scheduled for an AFIB ablation with [name] on 1/25.   Cardiac CT   On coumadin - needs 4 weeks of INR checks.   Routine Follow up   Thanks!!!! Bethann Berkshire

## 2022-07-11 ENCOUNTER — Institutional Professional Consult (permissible substitution): Payer: Medicare Other | Admitting: Cardiovascular Disease

## 2022-07-11 NOTE — Progress Notes (Unsigned)
Cardiology Office Note:    Date:  07/14/2022   ID:  Ronald Duran, DOB 02/05/1941, MRN [id number]  PCP:  Josetta Huddle, MD   [facility] Providers Cardiologist:  Sinclair Grooms, MD     Referring MD: Josetta Huddle, MD   Chief Complaint: preoperative cardiac evaluation  History of Present Illness:    Ronald Duran is a very pleasant 81 y.o. male with a hx of persistent atrial fibrillation on chronic anticoagulation with Coumadin, CAD, ICM, HTN, HLD, CVA, PAD with predominantly right leg claudication  History of MI with LAD stent placement December 1999, ICM with EF 40% by echo 2005.  Abnormal nuclear stress test 07/2015. Left heart catheterization 07/2015 with total occlusion of mid LAD after the first diagonal within the previously stented segment.  Well-developed apical collaterals from the dominant right coronary. Widely patent dominant RCA and circumflex arteries.  Akinesis of the apical segment with intramyocardial calcification, EF 45 to 50%.  Medical therapy recommended. 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  He has maintained consistent follow-up. LV function has been stable in the 40 to 45% range for several years and he has remained very active.  Atrial fibrillation detected incidentally September 2023 by his PCP.  Previous normal exam May 2023.  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  He had not noticed any palpitations or change in his exercise tolerance.  Seen by Dr. Tamala Julian on 05/19/2022 at which time HR was 87 bpm but with history of bradycardia, further up titration of BB was avoided.  Plan to closely monitor LV function with new onset atrial fibrillation. Had DCCV 06/03/22 but quickly reverted back to AF.  Referred to EP and seen by [name] on 07/05/22. Treatment options for persistent AF were reviewed as well as risks of invasive procedures.  Patient agreed to  proceed with ablation which is scheduled for 09/01/2022.  Clearance request for vitrectomy received 06/29/2022.  Request to hold anticoagulation.  Due to DCCV, anticoagulation may be held after 07/04/22.  Per Pharm.D recommendations, can discuss transition to [place].  If he remains on Coumadin he will need to be bridged with Lovenox.  Will need to consider timing of vitrectomy in light of ablation scheduled for 09/01/2022.   Today, he is here for preoperative cardiac evaluation and to discuss management of warfarin in regards to upcoming vitrectomy and a fib ablation. Was referred to Dr. Zadie Rhine for seeing a wrinkle in vision in his left eye. Dr. Zadie Rhine suggested  that he have repair within the next 5 months, not needed urgently. We discussed the mechanics of holding anticoagulation prior to surgery and the restrictions in regards to pre and post a fib ablation. He wants to postpone vitrectomy until after the ablation and understands he will need to continue anticoagulation for 3 months post procedure without interruption. No cardiac symptoms. Walks 2-3 miles 3 x per week, sometimes more, up and down inclines without symptoms of chest pain or SOB.  No edema, orthopnea, PND, presyncope, syncope, or bleeding concerns. Has overall been feeling well.  He is asymptomatic with atrial fibrillation.   Past Medical History:  Diagnosis Date   CAD (coronary artery disease)    a. s/p MI with LAD stent placement December, 1999 -last stress test November 2008 with no EKG evidence of ischemia  b. cath 07/23/2015 after abnormal ETT and nuc, occluded mid LAD stent with good distal  collateral, medical therapy    Cardiomyopathy ([place])    Ischemic cardiomyopathy with EF 40% by echo 2005   Dyslipidemia    Erectile dysfunction    Hiatal hernia    with esophageal strictures and dilatations in the past - Dr. Delfin Edis   History of nuclear stress test    Myoview 12/16: EF 47%, anterior, anteroseptal and inferoseptal defect  suggesting infarct with peri-infarct ischemia, intermediate risk   HTN (hypertension)    Hx of seasonal allergies    Hyperlipidemia    PAD (peripheral artery disease) ([place])    Pre-diabetes    Prostate cancer ([place])    with seed implant therapy following Lupron therapy in 2008 and 2009 - followed by [name]   Ringworm    Vitamin D deficiency     Past Surgical History:  Procedure Laterality Date   CARDIAC CATHETERIZATION N/A 07/23/2015   Procedure: Left Heart Cath and Coronary Angiography;  Surgeon: Belva Crome, MD;  Location: [facility];  Service: Cardiovascular;  Laterality: N/A;   CARDIOVERSION N/A 06/03/2022   Procedure: CARDIOVERSION;  Surgeon: Fay Records, MD;  Location: [facility];  Service: Cardiovascular;  Laterality: N/A;   COLONOSCOPY WITH PROPOFOL N/A 11/24/2015   Procedure: COLONOSCOPY WITH PROPOFOL;  Surgeon: Garlan Fair, MD;  Location: [facility];  Service: Endoscopy;  Laterality: N/A;   esophageal strictures with dilatations in the past per Dr. Delfin Edis     last '98   HERNIA REPAIR Left    LAD stent placement - Dr. Daneen Schick     left inguinal hernia repair - Dr. Magdalene River     prostatic radioactive seed implantation - Dr. Valere Dross     '09    Current Medications: Current Meds  Medication Sig   amLODipine (NORVASC) 5 MG tablet Take 1 tablet (5 mg total) by mouth daily.   atorvastatin (LIPITOR) 40 MG tablet Take 40 mg by mouth at bedtime.   B Complex Vitamins (B COMPLEX 100) tablet Take 1 tablet by mouth daily. Balance   benazepril-hydrochlorthiazide (LOTENSIN HCT) 20-12.5 MG tablet Take 1 tablet by mouth daily.   cetirizine (ZYRTEC) 10 MG tablet Take 10 mg by mouth daily.   Cholecalciferol (VITAMIN D3) 2000 UNITS TABS Take 2,000 Units by mouth daily.   Coenzyme Q10 (CO Q-10) 100 MG CAPS Take 100 mg by mouth daily with supper.   metoprolol succinate (TOPROL XL) 25 MG 24 hr tablet Take 1 tablet (25 mg total) by mouth daily.   Multiple  Vitamins-Minerals (CENTRUM) tablet Take 1 tablet by mouth daily.   nitroGLYCERIN (NITROSTAT) 0.4 MG SL tablet PLACE 1 TABLET UNDER THE TONGUE IF NEEDED EVERY 5 MINUTES FOR CHEST PAIN FOR 3 DOSES IF NO RELIEF AFTER FIRST DOSE CALL 911.   omeprazole (PRILOSEC) 20 MG capsule Take 20 mg by mouth every morning.   warfarin (COUMADIN) 5 MG tablet TAKE 1/2 TABLET TO 1 TABLET BY MOUTH DAILY AS DIRECTED BY COUMADIN CLINIC     Allergies:   Patient has no known allergies.   Social History   Socioeconomic History   Marital status: Married    Spouse name: Not on file   Number of children: Not on file   Years of education: Not on file   Highest education level: Not on file  Occupational History   Not on file  Tobacco Use   Smoking status: Never   Smokeless tobacco: Never  Vaping Use   Vaping Use: Never used  Substance and  Sexual Activity   Alcohol use: Yes    Alcohol/week: 0.0 standard drinks of alcohol    Comment: occasionally for special occasions     Drug use: No   Sexual activity: Not on file  Other Topics Concern   Not on file  Social History Narrative   Not on file   Social Determinants of Health   Financial Resource Strain: Not on file  Food Insecurity: Not on file  Transportation Needs: Not on file  Physical Activity: Not on file  Stress: Not on file  Social Connections: Not on file     Family History: The patient's family history includes CAD in his sister; CVA in his mother; CVA (age of onset: 48) in his father.  ROS:   Please see the history of present illness.   All other systems reviewed and are negative.  Labs/Other Studies Reviewed:    The following studies were reviewed today:   Echo 10/21/21 1. Left ventricular ejection fraction, by estimation, is 45 to 50%. The  left ventricle has mildly decreased function. The left ventricle  demonstrates regional wall motion abnormalities (see scoring  diagram/findings for description). Left ventricular  diastolic  parameters are indeterminate. There is moderate hypokinesis of  the left ventricular, mid-apical inferior wall. There is of the left  ventricular, apical apical segment.   2. Right ventricular systolic function is normal. The right ventricular  size is normal. There is normal pulmonary artery systolic pressure.   3. Left atrial size was moderately dilated.   4. Right atrial size was mild to moderately dilated.   5. The mitral valve is normal in structure. Trivial mitral valve  regurgitation. No evidence of mitral stenosis.   6. The aortic valve is tricuspid. There is mild calcification of the  aortic valve. Aortic valve regurgitation is trivial. Aortic valve  sclerosis is present, with no evidence of aortic valve stenosis.   7. The inferior vena cava is normal in size with greater than 50%  respiratory variability, suggesting right atrial pressure of 3 mmHg.   Comparison(s): Prior images unable to be directly viewed, comparison made  by report only.   Conclusion(s)/Recommendation(s): Mildly reduced LVEF with akinetic apex  and hypokinetic mid to distal inferior wall. No LV thormbus visualized  with echo contrast.  LHC 07/24/15  Mid LAD lesion, 99% stenosed. The lesion was previously treated with a stent (bare metal). LAD receives collaterals from the right coronary Mid LAD to Dist LAD lesion, 80% stenosed.   Total occlusion of the mid LAD after the first diagonal within the previously stented segment. Well-developed apical collaterals from the dominant right coronary. Widely patent dominant RCA and circumflex coronary arteries. Akinesis of the apical segment with intramyocardial calcification. EF 45-50%. The anatomy as described above explains the abnormalities noted on the recent myocardial perfusion study. In the absence of symptoms, conservative medical management will continue.   Recommendations:   Continue aggressive risk factor modification Continue walking program Notify if  any anginal symptoms.   Lexiscan Myoview 07/13/15 Nuclear stress EF: 47%. There was no ST segment deviation noted during stress. Findings consistent with prior myocardial infarction with peri-infarct ischemia. This is an intermediate risk study. The left ventricular ejection fraction is mildly decreased (45-54%).   1. There was a large, severe mid to apical anterior, anteroseptal, and inferoseptal perfusion defect.  This defect was primarily fixed with some reversibility, suggesting infarction with peri-infarct ischemia.  2. EF 47% with peri-apical hypokinesis.  3. Intermediate risk study (primarily infarction).  Recent Labs: 05/26/2022: BUN 17; Creatinine, Ser 1.12; Hemoglobin 17.3; Platelets 138; Potassium 4.1; Sodium 143  Recent Lipid Panel From [facility] 05/02/22 LDL 66, HDL 51, trigs 91   Risk Assessment/Calculations:    CHA2DS2-VASc Score = 7   This indicates a 11.2% annual risk of stroke. The patient's score is based upon: CHF History: 1 HTN History: 1 Diabetes History: 0 Stroke History: 2 Vascular Disease History: 1 Age Score: 2 Gender Score: 0    Physical Exam:    VS:  BP 138/86   Pulse 84   Ht 6' (1.829 m)   Wt 197 lb 12.8 oz (89.7 kg)   SpO2 97%   BMI 26.83 kg/m     Wt Readings from Last 3 Encounters:  07/14/22 197 lb 12.8 oz (89.7 kg)  07/05/22 198 lb (89.8 kg)  06/03/22 190 lb (86.2 kg)     GEN:  Well nourished, well developed in no acute distress HEENT: Normal NECK: No JVD; No carotid bruits CARDIAC: Irregular RR, no murmurs, rubs, gallops RESPIRATORY:  Clear to auscultation without rales, wheezing or rhonchi  ABDOMEN: Soft, non-tender, non-distended MUSCULOSKELETAL:  No edema; No deformity. 2+ pedal pulses, equal bilaterally SKIN: Warm and dry NEUROLOGIC:  Alert and oriented x 3 PSYCHIATRIC:  Normal affect   EKG:  EKG is ordered today.  The ekg ordered today demonstrates atrial fibrillation at 84 bpm, no acute ST abnormality    Diagnoses:     1. Preoperative cardiovascular examination   2. Persistent atrial fibrillation ([place])   3. Coronary artery disease involving native coronary artery of native heart without angina pectoris   4. Chronic HFrEF (heart failure with reduced ejection fraction) ([place])   5. Essential hypertension   6. Ischemic cardiomyopathy   7. 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 Long term current use of therapeutic drug    Assessment and Plan:     Preoperative cardiac evaluation: Planning to undergo vitrectomy in Spring 2024. Is overall doing well without specific cardiac symptoms. Is postponing vitrectomy until cleared to hold anticoagulation s/p a fib ablation. According to the Revised Cardiac Risk Index (RCRI), his Perioperative Risk of Major Cardiac Event is (%): 11. His Functional Capacity in METs is: 7.59 according to the [employer] Activity Status Index (DASI). He may proceed to surgery without further cardiac testing per AHA/ACC guidelines.  Advised him to notify us if he develops any concerning cardiac symptoms prior to vitrectomy. Guidance will be provided closer to the date of surgery regarding the specifics of holding coumadin and bridging with Lovenox.   Persistent atrial fibrillation on chronic anticoagulation: HR is well controlled. Referred to EP and seen by [name] 07/05/22 with plan to pursue a fib ablation January 2024. Remains asymptomatic with atrial fib. Has elected to proceed with Coumadin for anticoagulation. Have avoided up-titration of beta-blocker due to past history of bradycardia.  No bleeding concerns. Continue Coumadin, metoprolol.  Chronic anticoagulation: Has been on warfarin since CVA 2018. Feels most comfortable continuing warfarin instead of switching to a newer oral AC agent. Details regarding A-fib ablation and pending vitrectomy discussed and noted above.   CAD without angina: Total occlusion of mLAD within previously stented segment  with well-developed apical collaterals, widely patent RCA and circumflex on [facility] 07/2015. He denies chest pain, dyspnea, or other symptoms concerning for angina.  No indication for further ischemic evaluation at this time. LDL cholesterol is well-controlled.  Hyperlipidemia LDL goal < 70: LDL 66 on 05/02/2022.  Continue atorvastatin.   Chronic HFrEF/ICM: Mildly reduced LVEF 45 to 50%, indeterminate diastolic parameters, moderate hypokinesis of LV, midapical inferior wall. He denies dyspnea, chest pain, orthopnea, PND, edema.  Appears euvolemic on exam. Feeling well on current therapy. Could consider starting SGLT2 inhibitor in the future, however he is hesitant to make changes prior to ablation. Continue Lotensin, metoprolol.     Disposition: 6 months with Dr. Marlou Porch (transfer from Dr. Tamala Julian)  Medication Adjustments/Labs and Tests Ordered: Current medicines are reviewed at length with the patient today.  Concerns regarding medicines are outlined above.  Orders Placed This Encounter  Procedures   EKG 12-Lead   No orders of the defined types were placed in this encounter.   Patient Instructions  Medication Instructions:   Your physician recommends that you continue on your current medications as directed. Please refer to the Current Medication list given to you today.   *If you need a refill on your cardiac medications before your next appointment, please call your pharmacy*  Lab Work:  Your lab work will be done at [facility] the week prior to CT scan the week of January 15. You can come in on the day of your appointment anytime between 8:00-4:30, office closed for lunch 12-1.  If you have labs (blood work) drawn today and your tests are completely normal, you will receive your results only by: [place] (if you have MyChart) OR A paper copy in the mail If you have any lab test that is abnormal or we need to change your treatment, we will call you to review the  results.   Testing/Procedures:  None ordered.  Follow-Up: At [facility], you and your health needs are our priority.  As part of our continuing mission to provide you with exceptional heart care, we have created designated Provider Care Teams.  These Care Teams include your primary Cardiologist (physician) and Advanced Practice Providers (APPs -  Physician Assistants and Nurse Practitioners) who all work together to provide you with the care you need, when you need it.  We recommend signing up for the patient portal called "MyChart".  Sign up information is provided on this After Visit Summary.  MyChart is used to connect with patients for Virtual Visits (Telemedicine).  Patients are able to view lab/test results, encounter notes, upcoming appointments, etc.  Non-urgent messages can be sent to your provider as well.   To learn more about what you can do with MyChart, go to [email or web address].    Your next appointment:   6 month(s)  The format for your next appointment:   In Person  Provider:   Candee Furbish, MD    Important Information About Sugar         Signed, Ronald Life, NP  07/14/2022 1:25 PM    [place]

## 2022-07-11 NOTE — Telephone Encounter (Signed)
   Patient Name: Ronald Duran  DOB: 1940-11-18 MRN: [id number]  Primary Cardiologist: Sinclair Grooms, MD  Chart reviewed as part of pre-operative protocol coverage. As noted below, patient is now pending ablation 09/01/21. He has a post DCCV visit schedule this week 07/14/22 with Christen Bame NP at which time this clearance can be discussed and addressed, depending on plans per discussion with patient. Will route to [place] so she is aware. See pharmD input on duration of holding anticoagulation, and potential window to do so in December. Will also route to surgeon to let them know that patient is pending further appt to discuss.  [name], PA-C 07/11/2022, 9:30 AM

## 2022-07-11 NOTE — Telephone Encounter (Signed)
Pt sees Sharyn Lull on 12/7 at 10:30am, will need INR checked after. Now pending ablation on 09/01/22 - will not be able to interrupt anticoagulation for 3 weeks prior and 3 months after procedure. Ideally would have this procedure done in December 2023 to avoid interrupting ablation scheduled next month.   Will plan to discuss changing to [place] at this appt - not only will it avoid the need for Lovenox bridging, but would also avoid the need for weekly INR checks for the month leading up to pt's ablation.

## 2022-07-14 ENCOUNTER — Ambulatory Visit: Payer: Medicare Other | Attending: Nurse Practitioner | Admitting: Nurse Practitioner

## 2022-07-14 ENCOUNTER — Ambulatory Visit (INDEPENDENT_AMBULATORY_CARE_PROVIDER_SITE_OTHER): Payer: Medicare Other | Admitting: Pharmacist

## 2022-07-14 ENCOUNTER — Encounter: Payer: Self-pay | Admitting: Nurse Practitioner

## 2022-07-14 VITALS — BP 138/86 | HR 84 | Ht 72.0 in | Wt 197.8 lb

## 2022-07-14 DIAGNOSIS — I4891 Unspecified atrial fibrillation: Secondary | ICD-10-CM

## 2022-07-14 DIAGNOSIS — Z79899 Other long term (current) drug therapy: Secondary | ICD-10-CM

## 2022-07-14 DIAGNOSIS — I6349 Cerebral infarction due to embolism of other cerebral artery: Secondary | ICD-10-CM | POA: Diagnosis not present

## 2022-07-14 DIAGNOSIS — Z01810 Encounter for preprocedural cardiovascular examination: Secondary | ICD-10-CM | POA: Diagnosis not present

## 2022-07-14 DIAGNOSIS — Z7901 Long term (current) use of anticoagulants: Secondary | ICD-10-CM | POA: Diagnosis not present

## 2022-07-14 DIAGNOSIS — I2510 Atherosclerotic heart disease of native coronary artery without angina pectoris: Secondary | ICD-10-CM | POA: Diagnosis not present

## 2022-07-14 DIAGNOSIS — I4819 Other persistent atrial fibrillation: Secondary | ICD-10-CM | POA: Diagnosis not present

## 2022-07-14 DIAGNOSIS — I5022 Chronic systolic (congestive) heart failure: Secondary | ICD-10-CM | POA: Diagnosis not present

## 2022-07-14 DIAGNOSIS — I10 Essential (primary) hypertension: Secondary | ICD-10-CM | POA: Diagnosis not present

## 2022-07-14 DIAGNOSIS — I255 Ischemic cardiomyopathy: Secondary | ICD-10-CM

## 2022-07-14 LAB — POCT INR: INR: 2.1 (ref 2.0–3.0)

## 2022-07-14 NOTE — Telephone Encounter (Signed)
   Primary Cardiologist: Sinclair Grooms, MD  Chart reviewed as part of pre-operative protocol coverage. Based on ACC/AHA guidelines, Ronald Duran would be at acceptable risk for the planned procedure without further cardiovascular testing, however he has elected to postpone surgery until at least 90 days post atrial fibrillation ablation which is scheduled for 09/01/22. I have asked him to notify us once he has a surgical date.   As noted by pharmacist, he will start weekly INR checks 08/11/2021 and will be given advisement on bridging with Lovenox when appropriate.  Patient was advised that if he develops new symptoms prior to surgery to contact our office to arrange a follow-up appointment.  He verbalized understanding.  I will route this recommendation to the requesting party via Epic fax function and remove from pre-op pool.  Please call with questions.  Emmaline Life, NP-C  07/14/2022, 1:18 PM [address], [address] Office [phone] Fax [phone]

## 2022-07-14 NOTE — Patient Instructions (Signed)
Continue taking 1 tablet daily, except 1/2 tablet Monday, Wednesday and Friday.  Recheck INR in 4 weeks Start eating broccoli only every 3 days.  Anticoagulation Clinic-[phone]

## 2022-07-14 NOTE — Patient Instructions (Signed)
Medication Instructions:   Your physician recommends that you continue on your current medications as directed. Please refer to the Current Medication list given to you today.   *If you need a refill on your cardiac medications before your next appointment, please call your pharmacy*  Lab Work:  Your lab work will be done at [facility] the week prior to CT scan the week of January 15. You can come in on the day of your appointment anytime between 8:00-4:30, office closed for lunch 12-1.  If you have labs (blood work) drawn today and your tests are completely normal, you will receive your results only by: [place] (if you have MyChart) OR A paper copy in the mail If you have any lab test that is abnormal or we need to change your treatment, we will call you to review the results.   Testing/Procedures:  None ordered.  Follow-Up: At [facility], you and your health needs are our priority.  As part of our continuing mission to provide you with exceptional heart care, we have created designated Provider Care Teams.  These Care Teams include your primary Cardiologist (physician) and Advanced Practice Providers (APPs -  Physician Assistants and Nurse Practitioners) who all work together to provide you with the care you need, when you need it.  We recommend signing up for the patient portal called "MyChart".  Sign up information is provided on this After Visit Summary.  MyChart is used to connect with patients for Virtual Visits (Telemedicine).  Patients are able to view lab/test results, encounter notes, upcoming appointments, etc.  Non-urgent messages can be sent to your provider as well.   To learn more about what you can do with MyChart, go to [email or web address].    Your next appointment:   6 month(s)  The format for your next appointment:   In Person  Provider:   Candee Furbish, MD    Important Information About Sugar

## 2022-07-14 NOTE — Telephone Encounter (Signed)
Patient does not wish to switch to a DOAC. He plans to have his eye surgery after his ablation. Knows he has to wait 90 days post ablation and is ok with this. Will start weekly INR checks 1/4

## 2022-07-18 ENCOUNTER — Ambulatory Visit
Admission: RE | Admit: 2022-07-18 | Discharge: 2022-07-18 | Disposition: A | Discharge status: Home / Self Care | Payer: Medicare Other | Source: Ambulatory Visit | Attending: Gastroenterology | Admitting: Gastroenterology

## 2022-07-18 DIAGNOSIS — R1310 Dysphagia, unspecified: Secondary | ICD-10-CM

## 2022-07-18 DIAGNOSIS — K449 Diaphragmatic hernia without obstruction or gangrene: Secondary | ICD-10-CM

## 2022-07-25 ENCOUNTER — Ambulatory Visit: Payer: Medicare Other | Admitting: Interventional Cardiology

## 2022-08-09 ENCOUNTER — Telehealth: Payer: Self-pay | Admitting: Interventional Cardiology

## 2022-08-09 NOTE — Telephone Encounter (Signed)
STAT if patient feels like he/she is going to faint   Are you dizzy now? No   Do you feel faint or have you passed out? No   Do you have any other symptoms? No   Have you checked your HR and BP (record if available)? Yes, has been in a good range about 120/70's HR 60's-70's  Dizziness occurred for the first time yesterday morning when getting out of bed. States it has been occurring when he lays down and gets up since for about 15-20 seconds at a time. He states he had an episode of diarrhea 4 days ago that caused him to eat light up until Sunday when he ate a full meal with broccoli. Reports he has also been belching that started yesterday so he is unsure if indigestion could be the cause.

## 2022-08-10 NOTE — Telephone Encounter (Signed)
Returned call to patient.  Patient states he had an episode of diarrhea last week, none since then. He has been eating lighter foods as he had indulged in foods he doesn't normally eat over the holidays.  He reports frequent belching. Also reports episodes of dizziness lasting 15-20 seconds, when he gets up in the morning or when changing positions from prone to standing.  Patient denies any SOB or chest pain with these episodes. He states BP and HR have been WNL for him.  Encouraged patient to increase water intake by 1-2 extra glasses daily and to change positions slowly. Patient states he will resume his regular diet. Encouraged patient to follow-up with PCP if digestive issues persist, and to notify our office if his symptoms worsen or if he experiences SOB or chest pain.  Patient verbalized understanding and expressed appreciation for call.

## 2022-08-11 ENCOUNTER — Ambulatory Visit: Payer: Medicare Other | Attending: Cardiology

## 2022-08-11 DIAGNOSIS — I6349 Cerebral infarction due to embolism of other cerebral artery: Secondary | ICD-10-CM | POA: Diagnosis not present

## 2022-08-11 DIAGNOSIS — I4819 Other persistent atrial fibrillation: Secondary | ICD-10-CM | POA: Diagnosis not present

## 2022-08-11 DIAGNOSIS — Z79899 Other long term (current) drug therapy: Secondary | ICD-10-CM | POA: Diagnosis not present

## 2022-08-11 LAB — POCT INR: INR: 2.6 (ref 2.0–3.0)

## 2022-08-11 NOTE — Patient Instructions (Signed)
Description   Continue taking 1 tablet daily, except 1/2 tablet Monday, Wednesday and Friday.  Recheck INR in 1 week - weekly checks for ablation  Start eating broccoli only every 3 days.  Anticoagulation [phone]

## 2022-08-18 ENCOUNTER — Ambulatory Visit: Payer: Medicare Other | Attending: Interventional Cardiology | Admitting: *Deleted

## 2022-08-18 DIAGNOSIS — I6349 Cerebral infarction due to embolism of other cerebral artery: Secondary | ICD-10-CM

## 2022-08-18 DIAGNOSIS — Z79899 Other long term (current) drug therapy: Secondary | ICD-10-CM

## 2022-08-18 DIAGNOSIS — I4819 Other persistent atrial fibrillation: Secondary | ICD-10-CM | POA: Diagnosis not present

## 2022-08-18 LAB — POCT INR: POC INR: 3.5

## 2022-08-18 NOTE — Patient Instructions (Signed)
Description   Take 1/2 a tablet of warfarin today, then continue taking 1 tablet daily, except 1/2 tablet Monday, Wednesday and Friday.  Recheck INR in 1 week - weekly checks for ablation  Continue eating broccoli only every 2/3 days.  Anticoagulation Clinic-[phone]

## 2022-08-25 ENCOUNTER — Ambulatory Visit: Payer: Medicare Other | Attending: Interventional Cardiology

## 2022-08-25 DIAGNOSIS — Z79899 Other long term (current) drug therapy: Secondary | ICD-10-CM | POA: Diagnosis not present

## 2022-08-25 DIAGNOSIS — I4819 Other persistent atrial fibrillation: Secondary | ICD-10-CM | POA: Diagnosis not present

## 2022-08-25 DIAGNOSIS — I6349 Cerebral infarction due to embolism of other cerebral artery: Secondary | ICD-10-CM | POA: Diagnosis not present

## 2022-08-25 DIAGNOSIS — Z7901 Long term (current) use of anticoagulants: Secondary | ICD-10-CM | POA: Diagnosis not present

## 2022-08-25 LAB — CBC WITH DIFFERENTIAL/PLATELET
Basophils Absolute: 0.1 10*3/uL (ref 0.0–0.2)
Basos: 1 %
EOS (ABSOLUTE): 0.1 10*3/uL (ref 0.0–0.4)
Eos: 2 %
Hematocrit: 47.6 % (ref 37.5–51.0)
Hemoglobin: 16.5 g/dL (ref 13.0–17.7)
Immature Grans (Abs): 0.1 10*3/uL (ref 0.0–0.1)
Immature Granulocytes: 1 %
Lymphocytes Absolute: 1.4 10*3/uL (ref 0.7–3.1)
Lymphs: 19 %
MCH: 33 pg (ref 26.6–33.0)
MCHC: 34.7 g/dL (ref 31.5–35.7)
MCV: 95 fL (ref 79–97)
Monocytes Absolute: 0.7 10*3/uL (ref 0.1–0.9)
Monocytes: 10 %
Neutrophils Absolute: 4.9 10*3/uL (ref 1.4–7.0)
Neutrophils: 67 %
Platelets: 145 10*3/uL — ABNORMAL LOW (ref 150–450)
RBC: 5 x10E6/uL (ref 4.14–5.80)
RDW: 13 % (ref 11.6–15.4)
WBC: 7.3 10*3/uL (ref 3.4–10.8)

## 2022-08-25 LAB — POCT INR: INR: 2.2 (ref 2.0–3.0)

## 2022-08-25 NOTE — Patient Instructions (Signed)
Description   Take 1.5 tablets today, then continue taking 1 tablet daily, except 1/2 tablet Monday, Wednesday and Friday.  Recheck INR in 1 week - weekly checks for ablation  Continue eating broccoli only every 2/3 days Anticoagulation [phone]

## 2022-08-26 ENCOUNTER — Telehealth (HOSPITAL_COMMUNITY): Payer: Self-pay | Admitting: *Deleted

## 2022-08-26 LAB — BASIC METABOLIC PANEL
BUN/Creatinine Ratio: 13 (ref 10–24)
BUN: 14 mg/dL (ref 8–27)
CO2: 25 mmol/L (ref 20–29)
Calcium: 9.3 mg/dL (ref 8.6–10.2)
Chloride: 101 mmol/L (ref 96–106)
Creatinine, Ser: 1.08 mg/dL (ref 0.76–1.27)
Glucose: 96 mg/dL (ref 70–99)
Potassium: 4.1 mmol/L (ref 3.5–5.2)
Sodium: 141 mmol/L (ref 134–144)
eGFR: 70 mL/min/{1.73_m2} (ref >59)

## 2022-08-26 NOTE — Telephone Encounter (Signed)
Reaching out to patient to offer assistance regarding upcoming cardiac imaging study; pt verbalizes understanding of appt date/time, parking situation and where to check in, pre-test NPO status  and verified current allergies; name and call back number provided for further questions should they arise ? ?Ronald Reitan RN Navigator Cardiac Imaging ?[facility] ?[phone] office ?[phone] cell ? ?

## 2022-08-29 ENCOUNTER — Ambulatory Visit (HOSPITAL_BASED_OUTPATIENT_CLINIC_OR_DEPARTMENT_OTHER)
Admission: RE | Admit: 2022-08-29 | Discharge: 2022-08-29 | Disposition: A | Discharge status: Home / Self Care | Payer: Medicare Other | Source: Ambulatory Visit | Attending: Cardiovascular Disease | Admitting: Cardiovascular Disease

## 2022-08-29 ENCOUNTER — Encounter (HOSPITAL_BASED_OUTPATIENT_CLINIC_OR_DEPARTMENT_OTHER): Payer: Self-pay

## 2022-08-29 DIAGNOSIS — I4819 Other persistent atrial fibrillation: Secondary | ICD-10-CM | POA: Diagnosis not present

## 2022-08-29 MED ORDER — IOHEXOL 350 MG/ML SOLN
100.0000 mL | Freq: Once | INTRAVENOUS | Status: AC | PRN
  Administered 2022-08-29: 80 mL via INTRAVENOUS

## 2022-08-31 ENCOUNTER — Encounter: Payer: Self-pay | Admitting: *Deleted

## 2022-08-31 ENCOUNTER — Ambulatory Visit (HOSPITAL_COMMUNITY)
Admission: RE | Admit: 2022-08-31 | Discharge: 2022-08-31 | Disposition: A | Discharge status: Home / Self Care | Payer: Medicare Other | Source: Ambulatory Visit | Attending: Cardiovascular Disease | Admitting: Cardiovascular Disease

## 2022-08-31 ENCOUNTER — Encounter: Payer: Self-pay | Admitting: Cardiovascular Disease

## 2022-08-31 ENCOUNTER — Telehealth: Payer: Self-pay

## 2022-08-31 ENCOUNTER — Ambulatory Visit: Payer: Medicare Other

## 2022-08-31 ENCOUNTER — Encounter (HOSPITAL_COMMUNITY): Payer: Self-pay

## 2022-08-31 ENCOUNTER — Telehealth: Payer: Self-pay | Admitting: Cardiovascular Disease

## 2022-08-31 DIAGNOSIS — R918 Other nonspecific abnormal finding of lung field: Secondary | ICD-10-CM

## 2022-08-31 NOTE — Telephone Encounter (Signed)
[name] reviewed chest CT findings. Ablation for tomorrow is being cancelled. MD is calling pt to inform him of findings. STAT referral to pulmonology being placed. Pt will await their call to schedule him an appt and possible biopsy.

## 2022-08-31 NOTE — Telephone Encounter (Signed)
Called pt d/t missed [facility] appt. Pt stated his ablation was canceled due to chest CT results. Rescheduled [facility] appt for 09/21/22. Instructed pt to call [facility] Clinic if any other procedures are scheduled. Pt verbalized understanding.

## 2022-08-31 NOTE — Telephone Encounter (Signed)
[name] from [facility]  Radiology called in [place] noting multiple pulmonary nodules, most significant 9 mm suspicious.  Spoke to [name] as pt is scheduled for an ablation tomorrow.  Recommends stat chest CT today.

## 2022-08-31 NOTE — Telephone Encounter (Signed)
Pt informed of CT findings/pulm nodules. Aware [name] recommends stat chest CT today. Pt informed that I am looking into where he can get this done, preferably this morning. Aware I will call him back soon. Pt agreeable to plan.

## 2022-08-31 NOTE — Telephone Encounter (Signed)
[facility] radiology calling with a call report.

## 2022-09-01 ENCOUNTER — Encounter (HOSPITAL_COMMUNITY): Admission: RE | Payer: Self-pay | Source: Ambulatory Visit

## 2022-09-01 ENCOUNTER — Encounter: Payer: Self-pay | Admitting: Pulmonary Disease

## 2022-09-01 ENCOUNTER — Ambulatory Visit (HOSPITAL_COMMUNITY): Admission: RE | Admit: 2022-09-01 | Payer: Medicare Other | Source: Ambulatory Visit | Admitting: Cardiovascular Disease

## 2022-09-01 ENCOUNTER — Ambulatory Visit (INDEPENDENT_AMBULATORY_CARE_PROVIDER_SITE_OTHER): Payer: Medicare Other | Admitting: Pulmonary Disease

## 2022-09-01 VITALS — BP 140/90 | HR 90 | Ht 72.0 in | Wt 197.0 lb

## 2022-09-01 DIAGNOSIS — R918 Other nonspecific abnormal finding of lung field: Secondary | ICD-10-CM | POA: Diagnosis not present

## 2022-09-01 DIAGNOSIS — Z789 Other specified health status: Secondary | ICD-10-CM | POA: Diagnosis not present

## 2022-09-01 DIAGNOSIS — Z85820 Personal history of malignant melanoma of skin: Secondary | ICD-10-CM

## 2022-09-01 DIAGNOSIS — Z85828 Personal history of other malignant neoplasm of skin: Secondary | ICD-10-CM | POA: Diagnosis not present

## 2022-09-01 DIAGNOSIS — Z7901 Long term (current) use of anticoagulants: Secondary | ICD-10-CM

## 2022-09-01 DIAGNOSIS — Z8546 Personal history of malignant neoplasm of prostate: Secondary | ICD-10-CM

## 2022-09-01 SURGERY — ATRIAL FIBRILLATION ABLATION
Anesthesia: General

## 2022-09-01 NOTE — Patient Instructions (Signed)
Thank you for visiting Dr. Valeta Harms at [facility]. Today we recommend the following:  Orders Placed This Encounter  Procedures   NM PET Image Initial (PI) Skull Base To Thigh (F-18 FDG)   Please schedule prior to next appt   Return in about 3 weeks (around 09/22/2022) for with Eric Form, NP, or Dr. Valeta Harms. 15 min follow up appt.     Please do your part to reduce the spread of COVID-19.

## 2022-09-01 NOTE — Progress Notes (Signed)
Synopsis: Referred in January 2024 for pulmonary nodule by Mealor, Yetta Barre, MD  Subjective:   PATIENT ID: Ronald Duran GENDER: male DOB: 1941-01-19, MRN: [id number]  Chief Complaint  Patient presents with   Consult    Lung nodule.    This is a 82 year old gentleman, past medical history of coronary disease, ischemic cardiomyopathy, hiatal hernia, hypertension, hyperlipidemia, vitamin D deficiency.  Patient was diagnosed with prostate cancer in 2008 was given radiation seed implant and Lupron therapy.  He also had a melanoma of the right arm that he states was superficial and was treated with resection and a topical cream by his dermatologist.  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  He presents today after having a workup regarding his atrial fibrillation.  He had a coronary calcium scoring CT that showed right lower lobe pulmonary nodule followed by CT scan of the chest which found other small pulmonary nodules and a dominant 9 mm spiculated nodule in the right lower lobe.     Past Medical History:  Diagnosis Date   CAD (coronary artery disease)    a. s/p MI with LAD stent placement December, 1999 -last stress test November 2008 with no EKG evidence of ischemia  b. cath 07/23/2015 after abnormal ETT and nuc, occluded mid LAD stent with good distal collateral, medical therapy    Cardiomyopathy ([place])    Ischemic cardiomyopathy with EF 40% by echo 2005   Dyslipidemia    Erectile dysfunction    Hiatal hernia    with esophageal strictures and dilatations in the past - Dr. Delfin Edis   History of nuclear stress test    Myoview 12/16: EF 47%, anterior, anteroseptal and inferoseptal defect suggesting infarct with peri-infarct ischemia, intermediate risk   HTN (hypertension)    Hx of seasonal allergies    Hyperlipidemia    PAD (peripheral artery disease) ([place])    Pre-diabetes    Prostate cancer ([place])    with seed implant therapy following Lupron  therapy in 2008 and 2009 - followed by [name]   Ringworm    Vitamin D deficiency      Family History  Problem Relation Age of Onset   CVA Mother    CVA Father 67   CAD Sister      Past Surgical History:  Procedure Laterality Date   CARDIAC CATHETERIZATION N/A 07/23/2015   Procedure: Left Heart Cath and Coronary Angiography;  Surgeon: Belva Crome, MD;  Location: [facility];  Service: Cardiovascular;  Laterality: N/A;   CARDIOVERSION N/A 06/03/2022   Procedure: CARDIOVERSION;  Surgeon: Fay Records, MD;  Location: [place];  Service: Cardiovascular;  Laterality: N/A;   COLONOSCOPY WITH PROPOFOL N/A 11/24/2015   Procedure: COLONOSCOPY WITH PROPOFOL;  Surgeon: Garlan Fair, MD;  Location: [facility];  Service: Endoscopy;  Laterality: N/A;   esophageal strictures with dilatations in the past per Dr. Delfin Edis     last '98   HERNIA REPAIR Left    LAD stent placement - Dr. Daneen Schick     left inguinal hernia repair - Dr. Magdalene River     prostatic radioactive seed implantation - Dr. Valere Dross     '09    Social History   Socioeconomic History   Marital status: Married    Spouse name: Not on file   Number of children: Not on file   Years of education: Not on file   Highest education level: Not on file  Occupational History   Not on file  Tobacco Use   Smoking status: Never   Smokeless tobacco: Never  Vaping Use   Vaping Use: Never used  Substance and Sexual Activity   Alcohol use: Yes    Alcohol/week: 0.0 standard drinks of alcohol    Comment: occasionally for special occasions     Drug use: No   Sexual activity: Not on file  Other Topics Concern   Not on file  Social History Narrative   Not on file   Social Determinants of Health   Financial Resource Strain: Not on file  Food Insecurity: Not on file  Transportation Needs: Not on file  Physical Activity: Not on file  Stress: Not on file  Social Connections: Not on file  Intimate Partner  Violence: Not on file     No Known Allergies   Outpatient Medications Prior to Visit  Medication Sig Dispense Refill   cetirizine (ZYRTEC) 10 MG tablet Take 10 mg by mouth daily.     amLODipine (NORVASC) 5 MG tablet Take 1 tablet (5 mg total) by mouth daily. 90 tablet 3   atorvastatin (LIPITOR) 40 MG tablet Take 40 mg by mouth at bedtime.     B Complex Vitamins (B COMPLEX 100) tablet Take 1 tablet by mouth daily. Balance     benazepril-hydrochlorthiazide (LOTENSIN HCT) 20-12.5 MG tablet Take 1 tablet by mouth daily. 90 tablet 3   Cholecalciferol (VITAMIN D3) 2000 UNITS TABS Take 2,000 Units by mouth daily.     Coenzyme Q10 (CO Q-10) 100 MG CAPS Take 100 mg by mouth daily with supper.     metoprolol succinate (TOPROL XL) 25 MG 24 hr tablet Take 1 tablet (25 mg total) by mouth daily. 90 tablet 3   Multiple Vitamins-Minerals (CENTRUM) tablet Take 1 tablet by mouth daily.     nitroGLYCERIN (NITROSTAT) 0.4 MG SL tablet PLACE 1 TABLET UNDER THE TONGUE IF NEEDED EVERY 5 MINUTES FOR CHEST PAIN FOR 3 DOSES IF NO RELIEF AFTER FIRST DOSE CALL 911. 25 tablet 9   omeprazole (PRILOSEC) 20 MG capsule Take 20 mg by mouth every morning.     warfarin (COUMADIN) 5 MG tablet TAKE 1/2 TABLET TO 1 TABLET BY MOUTH DAILY AS DIRECTED BY COUMADIN CLINIC (Patient taking differently: Take 5 mg Sun, Tues, Thurs, and Sat Take 2.5 mg on Mon, Wed, and Fri) 75 tablet 0   No facility-administered medications prior to visit.    Review of Systems  Constitutional:  Negative for chills, fever, malaise/fatigue and weight loss.  HENT:  Negative for hearing loss, sore throat and tinnitus.   Eyes:  Negative for blurred vision and double vision.  Respiratory:  Negative for cough, hemoptysis, sputum production, shortness of breath, wheezing and stridor.   Cardiovascular:  Negative for chest pain, palpitations, orthopnea, leg swelling and PND.  Gastrointestinal:  Negative for abdominal pain, constipation, diarrhea, heartburn,  nausea and vomiting.  Genitourinary:  Negative for dysuria, hematuria and urgency.  Musculoskeletal:  Negative for joint pain and myalgias.  Skin:  Negative for itching and rash.  Neurological:  Negative for dizziness, tingling, weakness and headaches.  Endo/Heme/Allergies:  Negative for environmental allergies. Does not bruise/bleed easily.  Psychiatric/Behavioral:  Negative for depression. The patient is not nervous/anxious and does not have insomnia.   All other systems reviewed and are negative.    Objective:  Physical Exam Vitals reviewed.  Constitutional:      General: He is not in acute distress.    Appearance: He  is well-developed.  HENT:     Head: Normocephalic and atraumatic.  Eyes:     General: No scleral icterus.    Conjunctiva/sclera: Conjunctivae normal.     Pupils: Pupils are equal, round, and reactive to light.  Neck:     Vascular: No JVD.     Trachea: No tracheal deviation.  Cardiovascular:     Rate and Rhythm: Normal rate and regular rhythm.     Heart sounds: Normal heart sounds. No murmur heard. Pulmonary:     Effort: Pulmonary effort is normal. No tachypnea, accessory muscle usage or respiratory distress.     Breath sounds: No stridor. No wheezing, rhonchi or rales.  Abdominal:     General: There is no distension.     Palpations: Abdomen is soft.     Tenderness: There is no abdominal tenderness.  Musculoskeletal:        General: No tenderness.     Cervical back: Neck supple.  Lymphadenopathy:     Cervical: No cervical adenopathy.  Skin:    General: Skin is warm and dry.     Capillary Refill: Capillary refill takes less than 2 seconds.     Findings: No rash.  Neurological:     Mental Status: He is alert and oriented to person, place, and time.  Psychiatric:        Behavior: Behavior normal.      Vitals:   09/01/22 1525  BP: (!) 140/90  Pulse: 90  SpO2: 98%  Weight: 197 lb (89.4 kg)  Height: 6' (1.829 m)   98% on RA BMI Readings from  Last 3 Encounters:  09/01/22 26.72 kg/m  07/14/22 26.83 kg/m  07/05/22 26.85 kg/m   Wt Readings from Last 3 Encounters:  09/01/22 197 lb (89.4 kg)  07/14/22 197 lb 12.8 oz (89.7 kg)  07/05/22 198 lb (89.8 kg)     CBC    Component Value Date/Time   WBC 7.3 08/25/2022 1439   WBC 9.7 07/21/2015 0917   RBC 5.00 08/25/2022 1439   RBC 5.24 07/21/2015 0917   HGB 16.5 08/25/2022 1439   HCT 47.6 08/25/2022 1439   PLT 145 (L) 08/25/2022 1439   MCV 95 08/25/2022 1439   MCH 33.0 08/25/2022 1439   MCH 32.4 07/21/2015 0917   MCHC 34.7 08/25/2022 1439   MCHC 35.5 07/21/2015 0917   RDW 13.0 08/25/2022 1439   LYMPHSABS 1.4 08/25/2022 1439   MONOABS 0.5 07/21/2015 0917   EOSABS 0.1 08/25/2022 1439   BASOSABS 0.1 08/25/2022 1439     Chest Imaging:  CT chest 08/31/2022: 1 cm right lower lobe spiculated pulmonary nodule concerning for malignancy. Other small subcentimeter pulmonary nodules. The patient's images have been independently reviewed by me.    Pulmonary Functions Testing Results:     No data to display          FeNO:   Pathology:   Echocardiogram:   Heart Catheterization:     Assessment & Plan:     ICD-10-CM   1. Lung nodules  R91.8 NM PET Image Initial (PI) Skull Base To Thigh (F-18 FDG)    2. Nonsmoker  Z78.9     3. History of melanoma  Z85.820     4. History of squamous cell carcinoma of skin  Z85.828     5. History of prostate cancer  Z85.46     6. Current use of anticoagulant therapy  Z79.01       Discussion:  This is a 82 year old gentleman, history  of lung nodules found incidentally on CT imaging.  He has a history of melanoma, history of squamous cell carcinoma of the skin history of prostate cancer.  He is currently anticoagulated due to history of atrial fibrillation and stroke.  He had this found on a workup for possible ablation for atrial fibrillation.  Plan: The nodule is spiculated and its margins are concerning for possible small  malignancy. He has other small subcentimeter pulmonary nodules. The dominant lesion of concern is in the right lower lobe. We talked about various neck steps to include blood based protein risk stratification as well as nuclear medicine pet imaging. Patient is agreeable to proceed with a nuclear medicine PET scan. We also talked about the risk benefits and alternatives of consideration for bronchoscopy and biopsy. I do think we need to evaluate the nodule and come up with a risk ratification approach before we consider biopsy.  He is agreeable to this plan. We also need to consider biopsy before he was to have ablation as he would need to be anticoagulated for 3 months following this. We talked about all of these pros and cons today in the office. Patient return to clinic in 3 weeks after nuclear medicine PET scan.  It usually takes about 10-12 business days to get her PET scan scheduled at this time.  RTC 3 weeks to see me or SG, NP to discuss next steps and consider bronchoscopy if needed.   Current Outpatient Medications:    cetirizine (ZYRTEC) 10 MG tablet, Take 10 mg by mouth daily., Disp: , Rfl:    amLODipine (NORVASC) 5 MG tablet, Take 1 tablet (5 mg total) by mouth daily., Disp: 90 tablet, Rfl: 3   atorvastatin (LIPITOR) 40 MG tablet, Take 40 mg by mouth at bedtime., Disp: , Rfl:    B Complex Vitamins (B COMPLEX 100) tablet, Take 1 tablet by mouth daily. Balance, Disp: , Rfl:    benazepril-hydrochlorthiazide (LOTENSIN HCT) 20-12.5 MG tablet, Take 1 tablet by mouth daily., Disp: 90 tablet, Rfl: 3   Cholecalciferol (VITAMIN D3) 2000 UNITS TABS, Take 2,000 Units by mouth daily., Disp: , Rfl:    Coenzyme Q10 (CO Q-10) 100 MG CAPS, Take 100 mg by mouth daily with supper., Disp: , Rfl:    metoprolol succinate (TOPROL XL) 25 MG 24 hr tablet, Take 1 tablet (25 mg total) by mouth daily., Disp: 90 tablet, Rfl: 3   Multiple Vitamins-Minerals (CENTRUM) tablet, Take 1 tablet by mouth daily., Disp:  , Rfl:    nitroGLYCERIN (NITROSTAT) 0.4 MG SL tablet, PLACE 1 TABLET UNDER THE TONGUE IF NEEDED EVERY 5 MINUTES FOR CHEST PAIN FOR 3 DOSES IF NO RELIEF AFTER FIRST DOSE CALL 911., Disp: 25 tablet, Rfl: 9   omeprazole (PRILOSEC) 20 MG capsule, Take 20 mg by mouth every morning., Disp: , Rfl:    warfarin (COUMADIN) 5 MG tablet, TAKE 1/2 TABLET TO 1 TABLET BY MOUTH DAILY AS DIRECTED BY COUMADIN CLINIC (Patient taking differently: Take 5 mg Sun, Tues, Thurs, and Sat Take 2.5 mg on Mon, Wed, and Fri), Disp: 75 tablet, Rfl: 0  I spent 62 minutes dedicated to the care of this patient on the date of this encounter to include pre-visit review of records, face-to-face time with the patient discussing conditions above, post visit ordering of testing, clinical documentation with the electronic health record, making appropriate referrals as documented, and communicating necessary findings to members of the patients care team.   Garner Nash, DO [facility]  09/01/2022 3:55 PM

## 2022-09-19 ENCOUNTER — Encounter (HOSPITAL_COMMUNITY)
Admission: RE | Admit: 2022-09-19 | Discharge: 2022-09-19 | Disposition: A | Discharge status: Home / Self Care | Payer: Medicare Other | Source: Ambulatory Visit | Attending: Pulmonary Disease | Admitting: Pulmonary Disease

## 2022-09-19 DIAGNOSIS — R918 Other nonspecific abnormal finding of lung field: Secondary | ICD-10-CM | POA: Insufficient documentation

## 2022-09-19 LAB — GLUCOSE, CAPILLARY: Glucose-Capillary: 104 mg/dL — ABNORMAL HIGH (ref 70–99)

## 2022-09-19 MED ORDER — FLUDEOXYGLUCOSE F - 18 (FDG) INJECTION
9.8000 | Freq: Once | INTRAVENOUS | Status: AC | PRN
  Administered 2022-09-19: 9.76 via INTRAVENOUS

## 2022-09-20 ENCOUNTER — Other Ambulatory Visit: Payer: Self-pay

## 2022-09-20 MED ORDER — METOPROLOL SUCCINATE ER 25 MG PO TB24: 25.0000 mg | ORAL_TABLET | Freq: Every day | ORAL | 3 refills | Status: DC

## 2022-09-21 ENCOUNTER — Other Ambulatory Visit: Payer: Self-pay | Admitting: Cardiology

## 2022-09-21 ENCOUNTER — Ambulatory Visit: Payer: Medicare Other | Attending: Cardiovascular Disease | Admitting: *Deleted

## 2022-09-21 DIAGNOSIS — Z7901 Long term (current) use of anticoagulants: Secondary | ICD-10-CM

## 2022-09-21 DIAGNOSIS — I6349 Cerebral infarction due to embolism of other cerebral artery: Secondary | ICD-10-CM

## 2022-09-21 DIAGNOSIS — I4819 Other persistent atrial fibrillation: Secondary | ICD-10-CM | POA: Diagnosis not present

## 2022-09-21 DIAGNOSIS — Z79899 Other long term (current) drug therapy: Secondary | ICD-10-CM | POA: Diagnosis not present

## 2022-09-21 LAB — POCT INR: INR: 2.2 (ref 2.0–3.0)

## 2022-09-21 NOTE — Patient Instructions (Signed)
Description   Continue taking warfarin 1 tablet daily, except 1/2 tablet Monday, Wednesday and Friday.  Recheck INR in 4 weeks. Continue eating broccoli only every 2/3 days with a smaller serving on one day. Anticoagulation Clinic-[phone]

## 2022-09-23 ENCOUNTER — Encounter: Payer: Self-pay | Admitting: Pulmonary Disease

## 2022-09-23 ENCOUNTER — Ambulatory Visit (INDEPENDENT_AMBULATORY_CARE_PROVIDER_SITE_OTHER): Payer: Medicare Other | Admitting: Pulmonary Disease

## 2022-09-23 ENCOUNTER — Other Ambulatory Visit: Payer: Self-pay | Admitting: Physician Assistant

## 2022-09-23 VITALS — BP 130/90 | HR 96 | Ht 72.0 in | Wt 197.0 lb

## 2022-09-23 DIAGNOSIS — C7951 Secondary malignant neoplasm of bone: Secondary | ICD-10-CM

## 2022-09-23 DIAGNOSIS — Z7901 Long term (current) use of anticoagulants: Secondary | ICD-10-CM

## 2022-09-23 DIAGNOSIS — Z85820 Personal history of malignant melanoma of skin: Secondary | ICD-10-CM | POA: Diagnosis not present

## 2022-09-23 DIAGNOSIS — R918 Other nonspecific abnormal finding of lung field: Secondary | ICD-10-CM

## 2022-09-23 DIAGNOSIS — Z789 Other specified health status: Secondary | ICD-10-CM

## 2022-09-23 DIAGNOSIS — Z85828 Personal history of other malignant neoplasm of skin: Secondary | ICD-10-CM

## 2022-09-23 DIAGNOSIS — Z8546 Personal history of malignant neoplasm of prostate: Secondary | ICD-10-CM

## 2022-09-23 NOTE — Progress Notes (Signed)
Synopsis: Referred in January 2024 for pulmonary nodule by Corliss Blacker, MD  Subjective:   PATIENT ID: Ronald Duran: male DOB: [birth year], MRN: [id number]  Chief Complaint  Patient presents with   Follow-up    F/up PET scan    This is a 82 year old gentleman, past medical history of coronary disease, ischemic cardiomyopathy, hiatal hernia, hypertension, hyperlipidemia, vitamin D deficiency.  Patient was diagnosed with prostate cancer in 2008 was given radiation seed implant and Lupron therapy.  He also had a melanoma of the right arm that he states was superficial and was treated with resection and a topical cream by his dermatologist.  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  He presents today after having a workup regarding his atrial fibrillation.  He had a coronary calcium scoring CT that showed right lower lobe pulmonary nodule followed by CT scan of the chest which found other small pulmonary nodules and a dominant 9 mm spiculated nodule in the right lower lobe.   OV 09/23/2022: Here today for follow-up after nuclear medicine pet imaging.Patient had nuclear medicine PET scan on 09/19/2022 this revealed no significant hypermetabolic uptake within the lesion.  Felt like there was suboptimal evaluation of this there was some osseous findings within the T10 vertebral body and the left femoral neck concerning for potential underlying metastatic disease.  Did not feel that these lesions were typical for prostate cancer therefore underlying disease such as multiple myeloma.  Could be possibilitie    Past Medical History:  Diagnosis Date   CAD (coronary artery disease)    a. s/p MI with LAD stent placement December, 1999 -last stress test November 2008 with no EKG evidence of ischemia  b. cath 07/23/2015 after abnormal ETT and nuc, occluded mid LAD stent with good distal collateral, medical therapy    Cardiomyopathy ([place])    Ischemic cardiomyopathy  with EF 40% by echo 2005   Dyslipidemia    Erectile dysfunction    Hiatal hernia    with esophageal strictures and dilatations in the past - Dr. Delfin Edis   History of nuclear stress test    Myoview 12/16: EF 47%, anterior, anteroseptal and inferoseptal defect suggesting infarct with peri-infarct ischemia, intermediate risk   HTN (hypertension)    Hx of seasonal allergies    Hyperlipidemia    PAD (peripheral artery disease) ([place])    Pre-diabetes    Prostate cancer ([place])    with seed implant therapy following Lupron therapy in 2008 and 2009 - followed by [name]   Ringworm    Vitamin D deficiency      Family History  Problem Relation Age of Onset   CVA Mother    CVA Father 85   CAD Sister      Past Surgical History:  Procedure Laterality Date   CARDIAC CATHETERIZATION N/A 07/23/2015   Procedure: Left Heart Cath and Coronary Angiography;  Surgeon: Belva Crome, MD;  Location: [facility];  Service: Cardiovascular;  Laterality: N/A;   CARDIOVERSION N/A 06/03/2022   Procedure: CARDIOVERSION;  Surgeon: Fay Records, MD;  Location: [facility];  Service: Cardiovascular;  Laterality: N/A;   COLONOSCOPY WITH PROPOFOL N/A 11/24/2015   Procedure: COLONOSCOPY WITH PROPOFOL;  Surgeon: Garlan Fair, MD;  Location: [facility];  Service: Endoscopy;  Laterality: N/A;   esophageal strictures with dilatations in the past per Dr. Delfin Edis     last '98   [place] Left  LAD stent placement - Dr. Daneen Schick     left inguinal hernia repair - Dr. Magdalene River     prostatic radioactive seed implantation - Dr. Valere Dross     '09    Social History   Socioeconomic History   Marital status: Married    Spouse name: Not on file   Number of children: Not on file   Years of education: Not on file   Highest education level: Not on file  Occupational History   Not on file  Tobacco Use   Smoking status: Never   Smokeless tobacco: Never  Vaping Use   Vaping Use: Never  used  Substance and Sexual Activity   Alcohol use: Yes    Alcohol/week: 0.0 standard drinks of alcohol    Comment: occasionally for special occasions     Drug use: No   Sexual activity: Not on file  Other Topics Concern   Not on file  Social History Narrative   Not on file   Social Determinants of Health   Financial Resource Strain: Not on file  Food Insecurity: Not on file  Transportation Needs: Not on file  Physical Activity: Not on file  Stress: Not on file  Social Connections: Not on file  Intimate Partner Violence: Not on file     No Known Allergies   Outpatient Medications Prior to Visit  Medication Sig Dispense Refill   amLODipine (NORVASC) 5 MG tablet Take 1 tablet (5 mg total) by mouth daily. 90 tablet 3   atorvastatin (LIPITOR) 40 MG tablet Take 40 mg by mouth at bedtime.     B Complex Vitamins (B COMPLEX 100) tablet Take 1 tablet by mouth daily. Balance     benazepril-hydrochlorthiazide (LOTENSIN HCT) 20-12.5 MG tablet Take 1 tablet by mouth daily. 90 tablet 3   cetirizine (ZYRTEC) 10 MG tablet Take 10 mg by mouth daily.     Cholecalciferol (VITAMIN D3) 2000 UNITS TABS Take 2,000 Units by mouth daily.     Coenzyme Q10 (CO Q-10) 100 MG CAPS Take 100 mg by mouth daily with supper.     metoprolol succinate (TOPROL XL) 25 MG 24 hr tablet Take 1 tablet (25 mg total) by mouth daily. 90 tablet 3   Multiple Vitamins-Minerals (CENTRUM) tablet Take 1 tablet by mouth daily.     nitroGLYCERIN (NITROSTAT) 0.4 MG SL tablet PLACE 1 TABLET UNDER THE TONGUE IF NEEDED EVERY 5 MINUTES FOR CHEST PAIN FOR 3 DOSES IF NO RELIEF AFTER FIRST DOSE CALL 911. 25 tablet 9   omeprazole (PRILOSEC) 20 MG capsule Take 20 mg by mouth every morning.     warfarin (COUMADIN) 5 MG tablet TAKE 1/2 TO 1 TABLET BY MOUTH DAILY AS DIRECTED BY COUMADIN CLINIC 100 tablet 0   No facility-administered medications prior to visit.    Review of Systems  Constitutional:  Negative for chills, fever,  malaise/fatigue and weight loss.  HENT:  Negative for hearing loss, sore throat and tinnitus.   Eyes:  Negative for blurred vision and double vision.  Respiratory:  Negative for cough, hemoptysis, sputum production, shortness of breath, wheezing and stridor.   Cardiovascular:  Negative for chest pain, palpitations, orthopnea, leg swelling and PND.  Gastrointestinal:  Negative for abdominal pain, constipation, diarrhea, heartburn, nausea and vomiting.  Genitourinary:  Negative for dysuria, hematuria and urgency.  Musculoskeletal:  Negative for joint pain and myalgias.  Skin:  Negative for itching and rash.  Neurological:  Negative for dizziness, tingling, weakness and headaches.  Endo/Heme/Allergies:  Negative for environmental allergies. Does not bruise/bleed easily.  Psychiatric/Behavioral:  Negative for depression. The patient is not nervous/anxious and does not have insomnia.   All other systems reviewed and are negative.    Objective:  Physical Exam Vitals reviewed.  Constitutional:      General: He is not in acute distress.    Appearance: He is well-developed.  HENT:     Head: Normocephalic and atraumatic.  Eyes:     General: No scleral icterus.    Conjunctiva/sclera: Conjunctivae normal.     Pupils: Pupils are equal, round, and reactive to light.  Neck:     Vascular: No JVD.     Trachea: No tracheal deviation.  Cardiovascular:     Rate and Rhythm: Normal rate and regular rhythm.     Heart sounds: Normal heart sounds. No murmur heard. Pulmonary:     Effort: Pulmonary effort is normal. No tachypnea, accessory muscle usage or respiratory distress.     Breath sounds: No stridor. No wheezing, rhonchi or rales.  Abdominal:     General: There is no distension.     Palpations: Abdomen is soft.     Tenderness: There is no abdominal tenderness.  Musculoskeletal:        General: No tenderness.     Cervical back: Neck supple.  Lymphadenopathy:     Cervical: No cervical  adenopathy.  Skin:    General: Skin is warm and dry.     Capillary Refill: Capillary refill takes less than 2 seconds.     Findings: No rash.  Neurological:     Mental Status: He is alert and oriented to person, place, and time.  Psychiatric:        Behavior: Behavior normal.      Vitals:   09/23/22 1511  BP: (!) 130/90  Pulse: 96  SpO2: 98%  Weight: 197 lb (89.4 kg)  Height: 6' (1.829 m)   98% on RA BMI Readings from Last 3 Encounters:  09/23/22 26.72 kg/m  09/01/22 26.72 kg/m  07/14/22 26.83 kg/m   Wt Readings from Last 3 Encounters:  09/23/22 197 lb (89.4 kg)  09/01/22 197 lb (89.4 kg)  07/14/22 197 lb 12.8 oz (89.7 kg)     CBC    Component Value Date/Time   WBC 7.3 08/25/2022 1439   WBC 9.7 07/21/2015 0917   RBC 5.00 08/25/2022 1439   RBC 5.24 07/21/2015 0917   HGB 16.5 08/25/2022 1439   HCT 47.6 08/25/2022 1439   PLT 145 (L) 08/25/2022 1439   MCV 95 08/25/2022 1439   MCH 33.0 08/25/2022 1439   MCH 32.4 07/21/2015 0917   MCHC 34.7 08/25/2022 1439   MCHC 35.5 07/21/2015 0917   RDW 13.0 08/25/2022 1439   LYMPHSABS 1.4 08/25/2022 1439   MONOABS 0.5 07/21/2015 0917   EOSABS 0.1 08/25/2022 1439   BASOSABS 0.1 08/25/2022 1439     Chest Imaging:  CT chest 08/31/2022: 1 cm right lower lobe spiculated pulmonary nodule concerning for malignancy. Other small subcentimeter pulmonary nodules. The patient's images have been independently reviewed by me.    Nuclear medicine pet imaging February 123456: Hypermetabolic lesions within the thoracic spine as well as femoral head.  Also a small nodule that does not have any significant uptake. Concerning for possible metastatic disease to the bone. The patient's images have been independently reviewed by me.    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 Lung nodules  R91.8     3. Nonsmoker  Z78.9     4. History of melanoma  Z85.820     5. History of squamous cell carcinoma of skin  Z85.828     6. History of prostate cancer  Z85.46     7. Current use of anticoagulant therapy  Z79.01        Discussion:  This is a 82 year old gentleman, history of pulmonary nodules that were incidentally found on CT imaging.  He has a history of melanoma, history of squamous cell carcinoma of the skin, history of prostate cancer.  Anticoagulation due to history of atrial fibrillation and stroke.  During this workup for possible ablation found incidental pulmonary nodules.  Plan: Nuclear medicine pet imaging for pulmonary nodule does not have any significant uptake however found lesions within the thoracic spine and within the femoral head. Concern for potential underlying metastatic disease. I am going to discuss case with medical oncology talk about next best steps for tissue sampling. We can go after the small pulmonary nodule however I think the yield will be low. I have ordered a thoracic MRI. He may be better served with bone biopsy. I will discuss with medical oncology first.  Before placing a referral to interventional radiology.  Patient to see [place] in a couple of weeks to make sure he is getting all of the things he needs done set up for his appropriate appointments.    Current Outpatient Medications:    amLODipine (NORVASC) 5 MG tablet, Take 1 tablet (5 mg total) by mouth daily., Disp: 90 tablet, Rfl: 3   atorvastatin (LIPITOR) 40 MG tablet, Take 40 mg by mouth at bedtime., Disp: , Rfl:    B Complex Vitamins (B COMPLEX 100) tablet, Take 1 tablet by mouth daily. Balance, Disp: , Rfl:    benazepril-hydrochlorthiazide (LOTENSIN HCT) 20-12.5 MG tablet, Take 1 tablet by mouth daily., Disp: 90 tablet, Rfl: 3   cetirizine (ZYRTEC)  10 MG tablet, Take 10 mg by mouth daily., Disp: , Rfl:    Cholecalciferol (VITAMIN D3) 2000 UNITS TABS, Take 2,000 Units by mouth daily., Disp: , Rfl:    Coenzyme Q10 (CO Q-10) 100 MG CAPS, Take 100 mg by mouth daily with supper., Disp: , Rfl:    metoprolol succinate (TOPROL XL) 25 MG 24 hr tablet, Take 1 tablet (25 mg total) by mouth daily., Disp: 90 tablet, Rfl: 3   Multiple Vitamins-Minerals (CENTRUM) tablet, Take 1 tablet by mouth daily., Disp: , Rfl:    nitroGLYCERIN (NITROSTAT) 0.4 MG SL tablet, PLACE 1 TABLET UNDER THE TONGUE IF NEEDED EVERY 5 MINUTES FOR CHEST PAIN FOR 3 DOSES IF NO RELIEF AFTER FIRST DOSE CALL 911., Disp: 25 tablet, Rfl: 9   omeprazole (PRILOSEC) 20 MG capsule, Take 20 mg by mouth every morning., Disp: , Rfl:    warfarin (COUMADIN) 5 MG tablet, TAKE 1/2 TO 1 TABLET BY MOUTH DAILY AS DIRECTED BY COUMADIN CLINIC, Disp: 100 tablet, Rfl: 0  Garner Nash, DO [place] [facility] 09/23/2022 3:37 PM

## 2022-09-23 NOTE — Patient Instructions (Addendum)
Thank you for visiting Dr. Valeta Harms at [facility]. Today we recommend the following:  Orders Placed This Encounter  Procedures   MR THORACIC SPINE W WO CONTRAST   Return in about 4 weeks (around 10/21/2022).    Please do your part to reduce the spread of COVID-19.

## 2022-09-26 ENCOUNTER — Telehealth: Payer: Self-pay | Admitting: Internal Medicine

## 2022-09-26 ENCOUNTER — Telehealth: Payer: Self-pay | Admitting: Pulmonary Disease

## 2022-09-26 DIAGNOSIS — C7951 Secondary malignant neoplasm of bone: Secondary | ICD-10-CM

## 2022-09-26 NOTE — Telephone Encounter (Signed)
Called and spoke with patient. He verbalized understanding. He is aware that he will receive a call to get this scheduled.   Nothing further needed at time of call.

## 2022-09-26 NOTE — Telephone Encounter (Signed)
PCCM:  Please let patient know I have reviewed case with medical oncology.  Orders placed for interventional radiology to consider bone biopsy of the vertebral lesion.  Garner Nash, DO [place] [facility] 09/26/2022 11:36 AM

## 2022-09-26 NOTE — Telephone Encounter (Signed)
-----   Message from Curt Bears, MD sent at 09/25/2022  4:56 PM EST ----- Regarding: RE: review case Yes. I agree. We can start with the Bone Biopsy and Then Liquid biopsy if Lung Cancer. Thank you Julien Nordmann ----- Message ----- From: Garner Nash, DO Sent: 09/23/2022   3:30 PM EST To: Curt Bears, MD; # Subject: review case                                    Hello,   Can you take a look at this case? He has a small RLL nodule. But he has significant spine and hip findings. Thoughts on how to start the biopsy process?  IR for bone? No other soft tissue. I can go for the tiny nodule but might not yield anything   Thanks for the thoughts  Ronald Duran

## 2022-09-26 NOTE — Telephone Encounter (Signed)
Scheduled appt per 2/16 referral. Pt is aware of appt date and time. Pt is aware to arrive 15 mins prior to appt time and to bring and updated insurance card. Pt is aware of appt location.   °

## 2022-09-27 ENCOUNTER — Ambulatory Visit (HOSPITAL_COMMUNITY)
Admission: RE | Admit: 2022-09-27 | Discharge: 2022-09-27 | Disposition: A | Discharge status: Home / Self Care | Payer: Medicare Other | Source: Ambulatory Visit | Attending: Pulmonary Disease | Admitting: Pulmonary Disease

## 2022-09-27 DIAGNOSIS — C7951 Secondary malignant neoplasm of bone: Secondary | ICD-10-CM | POA: Diagnosis not present

## 2022-09-27 MED ORDER — GADOBUTROL 1 MMOL/ML IV SOLN
10.0000 mL | Freq: Once | INTRAVENOUS | Status: AC | PRN
  Administered 2022-09-27: 10 mL via INTRAVENOUS

## 2022-09-29 ENCOUNTER — Encounter: Payer: Self-pay | Admitting: Cardiology

## 2022-09-29 ENCOUNTER — Telehealth: Payer: Self-pay | Admitting: Pulmonary Disease

## 2022-09-29 ENCOUNTER — Ambulatory Visit (HOSPITAL_COMMUNITY): Payer: Medicare Other | Admitting: Physician Assistant

## 2022-09-29 NOTE — Telephone Encounter (Signed)
Called and spoke to patient. He is requesting a call from Dr. Valeta Harms directly to discuss MRI results.  Dr. Valeta Harms, please advise. Thanks

## 2022-09-30 NOTE — Progress Notes (Signed)
I called and spoke with the patient regarding his MR findings.  IR consult for bx is pending   Thanks,  BLI  Garner Nash, DO [facility] 09/30/2022 11:58 AM

## 2022-09-30 NOTE — Telephone Encounter (Signed)
Jerline Pain, MD  to Garner Nash, DO  Linwood Dibbles, CMA  [facility] Triage Claris Gladden, MD  [facility] [address]  [name], [place]     09/30/22  3:51 PM  Okay to hold Coumadin Thank you.  Candee Furbish, MD

## 2022-09-30 NOTE — Telephone Encounter (Signed)
I sent message to Donita Brooks in scheduling yesterday - she is having to send back to IR for review due to pt just had recent MRI.

## 2022-10-04 ENCOUNTER — Telehealth: Payer: Self-pay | Admitting: Pharmacist

## 2022-10-04 ENCOUNTER — Other Ambulatory Visit: Payer: Self-pay | Admitting: Pulmonary Disease

## 2022-10-04 ENCOUNTER — Telehealth: Payer: Self-pay | Admitting: *Deleted

## 2022-10-04 ENCOUNTER — Telehealth: Payer: Self-pay | Admitting: Medical Oncology

## 2022-10-04 ENCOUNTER — Telehealth (HOSPITAL_COMMUNITY): Payer: Self-pay

## 2022-10-04 DIAGNOSIS — S22070A Wedge compression fracture of T9-T10 vertebra, initial encounter for closed fracture: Secondary | ICD-10-CM

## 2022-10-04 DIAGNOSIS — C7951 Secondary malignant neoplasm of bone: Secondary | ICD-10-CM

## 2022-10-04 DIAGNOSIS — I4891 Unspecified atrial fibrillation: Secondary | ICD-10-CM

## 2022-10-04 MED ORDER — ENOXAPARIN SODIUM 80 MG/0.8ML IJ SOSY: 80.0000 mg | PREFILLED_SYRINGE | Freq: Two times a day (BID) | INTRAMUSCULAR | 0 refills | Status: DC

## 2022-10-04 NOTE — Telephone Encounter (Signed)
   Patient Name: Ronald Duran  DOB: 13-Feb-1941 MRN: [id number]  Primary Cardiologist: Sinclair Grooms, MD (Inactive)  Chart reviewed as part of pre-operative protocol coverage. Given recent in-person visit for surgical clearance 07/2022 cleared for unrelated surgery at that time, I reached out to patient for routine phone call to ensure no changes from that visit before we hold anticoagulation for procedure below. Pt reports he is doing well from cardiac standpoint, still walking 2-3 miles 3x week, no CP, SOB, palpitations, or syncope. Ablation remains on hold at this time due to ongoing workup for malignancy so that he can continue to interrupt anticoagulation for proposed procedures. From medical standpoint may proceed with surgery as requested but will route to pharm to help coordinate warfarin instructions with patient and reach out to him with instructions. Once this information is known will bundle rec and route to surgeon.   [name], PA-C 10/04/2022, 9:14 AM

## 2022-10-04 NOTE — Telephone Encounter (Signed)
Lab test information explained to pt -no fasting needed. LVM to return call if needs more information.

## 2022-10-04 NOTE — Telephone Encounter (Signed)
   Pre-operative Risk Assessment    Patient Name: Ronald Duran  DOB: 1941/06/12 MRN: [id number]      Request for Surgical Clearance    Procedure:   VERTEBRAL MASS BIOPSY / KYPHOPLASTY / OSEOCOOL  Date of Surgery:  Clearance 10/10/22                                 Surgeon:  DR. Erven Colla DE. MACEDO RODRIGUES Surgeon's Group or Practice Name:  [facility] Phone number:  [phone] Fax number:  [phone]   Type of Clearance Requested:   - Pharmacy:  Hold Warfarin (Coumadin) X'S 4 DAYS   Type of Anesthesia:   MODERATE SEDATION   Additional requests/questions:    Astrid Divine   10/04/2022, 9:04 AM

## 2022-10-04 NOTE — Telephone Encounter (Signed)
-----   Message from Pedro Earls, MD sent at 09/30/2022 10:48 AM EST ----- Regarding: RE: CT Needle placement Great case for biopsy followed by osteocool and kypho.  ----- Message ----- From: Joanell Rising Sent: 09/30/2022  10:46 AM EST To: Pedro Earls, MD; # Subject: RE: CT Needle placement                        Kat,  Take a look at this and let me know!  Thanks  ----- Message ----- From: Donita Brooks D Sent: 09/30/2022  10:45 AM EST To: Luanne Bras, MD; # Subject: FW: CT Needle placement                         ----- Message ----- From: Aletta Edouard, MD Sent: 09/30/2022   9:45 AM EST To: Riley Lam Subject: RE: CT Needle placement                        Please forward to Drs. Deveshwar and Karenann Cai for review. Might have to go through [facility] to show them personally, I don't know.  GY   ----- Message ----- From: Donita Brooks D Sent: 09/30/2022   9:37 AM EST To: Ir Procedure Requests Subject: FW: CT Needle placement                         Review needed    ----- Message ----- From: Greggory Keen, MD Sent: 09/30/2022   9:29 AM EST To: Riley Lam Subject: RE: CT Needle placement                        Needs review by NIR or one of the spine IR docs for possible bx of the T10 lesion  Not one I would do.  Forward to those rads for review  Thanks TS   ----- Message ----- From: Donita Brooks D Sent: 09/29/2022   2:19 PM EST To: Greggory Keen, MD Subject: FW: CT Needle placement                        LOL sorry  here it is  ----- Message ----- From: Donita Brooks D Sent: 09/27/2022   1:38 PM EST To: Ir Procedure Requests Subject: CT Needle placement                            Procedure: CT Needle Placement   Reason: vertebral mass    History Imaging in EPIC :  MRI done today per GY  Provider:  Garner Nash, DO   Provider Contact# [phone]

## 2022-10-04 NOTE — Telephone Encounter (Signed)
Patient with diagnosis of A Fib on warfarin for anticoagulation.    Procedure:  VERTEBRAL MASS BIOPSY / KYPHOPLASTY / OSEOCOOL  Date of procedure: 10/10/22   CHA2DS2-VASc Score = 7  This indicates a 11.2% annual risk of stroke. The patient's score is based upon: CHF History: 1 HTN History: 1 Diabetes History: 0 Stroke History: 2 Vascular Disease History: 1 Age Score: 2 Gender Score: 0   CrCl 70 mL/min Platelet count 145K  Per office protocol, patient can hold warfarin  for 5 days prior to procedure.    Patient WILL  need bridging with Lovenox (enoxaparin) around procedure. Please see other telephone encounter for Lovenox instructions.  **This guidance is not considered finalized until pre-operative APP has relayed final recommendations.**

## 2022-10-04 NOTE — Telephone Encounter (Signed)
Contact patient and went over Lovenox instructions. Also gave Good Rx information. Scheduled next INR for 3/11  2/28: Last dose of warfarin.  2/29: No warfarin or enoxaparin (Lovenox).  3/1: Inject enoxaparin '80mg'$  in the fatty abdominal tissue at least 2 inches from the belly button twice a day about 12 hours apart, 8am and 8pm rotate sites. No warfarin.  3/2: Inject enoxaparin in the fatty tissue every 12 hours, 8am and 8pm. No warfarin.  3/3: Inject enoxaparin in the fatty tissue every 12 hours, 8am and 8pm. No warfarin.  3/4: Inject enoxaparin in the fatty tissue in the morning at 8 am (No PM dose). No warfarin.  3/5: Procedure Day - No enoxaparin - Resume warfarin in the evening or as directed by doctor (take an extra half tablet with usual dose for 2 days then resume normal dose).  3/6: Resume enoxaparin inject in the fatty tissue every 12 hours and take warfarin  3/7: Inject enoxaparin in the fatty tissue every 12 hours and take warfarin  3/8: Inject enoxaparin in the fatty tissue every 12 hours and take warfarin  3/9: Inject enoxaparin in the fatty tissue every 12 hours and take warfarin  3/10: Inject enoxaparin in the fatty tissue every 12 hours and take warfarin  3/11: warfarin appt to check INR.

## 2022-10-04 NOTE — Telephone Encounter (Signed)
   Patient Name: Ronald Duran  DOB: 1941-03-24 MRN: [id number]  Primary Cardiologist: Sinclair Grooms, MD (Inactive)  Chart reviewed as part of pre-operative protocol coverage. As per conversation with patient below,  the patient would be at acceptable risk for the planned procedure without further cardiovascular testing. The patient was advised that if he develops new symptoms prior to surgery to contact our office to arrange for a follow-up visit, and he verbalized understanding.  Per office protocol, patient can hold warfarin  for 5 days prior to procedure. Patient WILL  need bridging with Lovenox (enoxaparin) around procedure. Please see other telephone encounter for Lovenox instructions. Our pharmD contacted patient to review instructions.  Will route this bundled recommendation to requesting provider via Epic fax function. Please call with questions.  [name], PA-C 10/04/2022, 11:07 AM

## 2022-10-05 ENCOUNTER — Inpatient Hospital Stay: Payer: Medicare Other | Attending: Internal Medicine | Admitting: Internal Medicine

## 2022-10-05 ENCOUNTER — Inpatient Hospital Stay: Payer: Medicare Other

## 2022-10-05 ENCOUNTER — Other Ambulatory Visit: Payer: Self-pay

## 2022-10-05 ENCOUNTER — Encounter: Payer: Self-pay | Admitting: Internal Medicine

## 2022-10-05 VITALS — BP 141/96 | HR 85 | Temp 97.9°F | Resp 17 | Wt 193.0 lb

## 2022-10-05 DIAGNOSIS — C7951 Secondary malignant neoplasm of bone: Secondary | ICD-10-CM | POA: Diagnosis not present

## 2022-10-05 DIAGNOSIS — R918 Other nonspecific abnormal finding of lung field: Secondary | ICD-10-CM | POA: Diagnosis not present

## 2022-10-05 DIAGNOSIS — R7303 Prediabetes: Secondary | ICD-10-CM

## 2022-10-05 DIAGNOSIS — M899 Disorder of bone, unspecified: Secondary | ICD-10-CM

## 2022-10-05 DIAGNOSIS — C801 Malignant (primary) neoplasm, unspecified: Secondary | ICD-10-CM | POA: Diagnosis not present

## 2022-10-05 DIAGNOSIS — C61 Malignant neoplasm of prostate: Secondary | ICD-10-CM

## 2022-10-05 DIAGNOSIS — Z8546 Personal history of malignant neoplasm of prostate: Secondary | ICD-10-CM | POA: Insufficient documentation

## 2022-10-05 DIAGNOSIS — I10 Essential (primary) hypertension: Secondary | ICD-10-CM

## 2022-10-05 DIAGNOSIS — E559 Vitamin D deficiency, unspecified: Secondary | ICD-10-CM | POA: Diagnosis not present

## 2022-10-05 DIAGNOSIS — R911 Solitary pulmonary nodule: Secondary | ICD-10-CM | POA: Diagnosis not present

## 2022-10-05 DIAGNOSIS — Z85820 Personal history of malignant melanoma of skin: Secondary | ICD-10-CM

## 2022-10-05 DIAGNOSIS — Z8049 Family history of malignant neoplasm of other genital organs: Secondary | ICD-10-CM

## 2022-10-05 LAB — CBC WITH DIFFERENTIAL (CANCER CENTER ONLY)
Abs Immature Granulocytes: 0.05 10*3/uL (ref 0.00–0.07)
Basophils Absolute: 0.1 10*3/uL (ref 0.0–0.1)
Basophils Relative: 1 %
Eosinophils Absolute: 0.1 10*3/uL (ref 0.0–0.5)
Eosinophils Relative: 1 %
HCT: 48.8 % (ref 39.0–52.0)
Hemoglobin: 17.1 g/dL — ABNORMAL HIGH (ref 13.0–17.0)
Immature Granulocytes: 1 %
Lymphocytes Relative: 12 %
Lymphs Abs: 1.2 10*3/uL (ref 0.7–4.0)
MCH: 33.5 pg (ref 26.0–34.0)
MCHC: 35 g/dL (ref 30.0–36.0)
MCV: 95.5 fL (ref 80.0–100.0)
Monocytes Absolute: 0.7 10*3/uL (ref 0.1–1.0)
Monocytes Relative: 7 %
Neutro Abs: 8 10*3/uL — ABNORMAL HIGH (ref 1.7–7.7)
Neutrophils Relative %: 78 %
Platelet Count: 173 10*3/uL (ref 150–400)
RBC: 5.11 MIL/uL (ref 4.22–5.81)
RDW: 13.3 % (ref 11.5–15.5)
WBC Count: 10 10*3/uL (ref 4.0–10.5)
nRBC: 0 % (ref 0.0–0.2)

## 2022-10-05 LAB — CMP (CANCER CENTER ONLY)
ALT: 31 U/L (ref 0–44)
AST: 25 U/L (ref 15–41)
Albumin: 3.9 g/dL (ref 3.5–5.0)
Alkaline Phosphatase: 69 U/L (ref 38–126)
Anion gap: 3 — ABNORMAL LOW (ref 5–15)
BUN: 14 mg/dL (ref 8–23)
CO2: 32 mmol/L (ref 22–32)
Calcium: 9.3 mg/dL (ref 8.9–10.3)
Chloride: 101 mmol/L (ref 98–111)
Creatinine: 1.07 mg/dL (ref 0.61–1.24)
GFR, Estimated: >60 mL/min (ref >60)
Glucose, Bld: 95 mg/dL (ref 70–99)
Potassium: 4.2 mmol/L (ref 3.5–5.1)
Sodium: 136 mmol/L (ref 135–145)
Total Bilirubin: 1.7 mg/dL — ABNORMAL HIGH (ref 0.3–1.2)
Total Protein: 7.5 g/dL (ref 6.5–8.1)

## 2022-10-05 LAB — LACTATE DEHYDROGENASE: LDH: 114 U/L (ref 98–192)

## 2022-10-05 NOTE — Progress Notes (Signed)
Navigator met with the patient for the first time today at patient's Med Onc consult with Dr.Mohamed. Patient was accompanied by Ronald Duran, Ronald Duran. Patient aware that he needs will need to see Radiation Oncology and Orthopedic surgery for Ronald left hip. Patient is aware he should avoid heavy weight bearing to Ronald left leg due to the increased risk of fracture to Ronald left hip. Dr. Julien Nordmann has placed both those referrals. The patient and Ronald Duran are well supported by local friends and family, no financial concerns, no issues with transportation, no food disparities. Patient is scheduled to have a bone biopsy on 3/5. Navigator provided contact information to the patient and Ronald Duran and encouraged contact with any questions or concerns.

## 2022-10-05 NOTE — Progress Notes (Signed)
[facility] [phone]   [phone]  CONSULT NOTE  REFERRING PHYSICIAN:  REASON FOR CONSULTATION:  ***  HPI Ronald Duran is a 82 y.o. male.  *** HPI  Past Medical History:  Diagnosis Date  . CAD (coronary artery disease)    a. s/p MI with LAD stent placement December, 1999 -last stress test November 2008 with no EKG evidence of ischemia  b. cath 07/23/2015 after abnormal ETT and nuc, occluded mid LAD stent with good distal collateral, medical therapy   . Cardiomyopathy ([place])    Ischemic cardiomyopathy with EF 40% by echo 2005  . Dyslipidemia   . Erectile dysfunction   . Hiatal hernia    with esophageal strictures and dilatations in the past - Dr. Delfin Edis  . History of nuclear stress test    Myoview 12/16: EF 47%, anterior, anteroseptal and inferoseptal defect suggesting infarct with peri-infarct ischemia, intermediate risk  . HTN (hypertension)   . Hx of seasonal allergies   . Hyperlipidemia   . PAD (peripheral artery disease) ([place])   . Pre-diabetes   . Prostate cancer [facility])    with seed implant therapy following Lupron therapy in 2008 and 2009 - followed by [name]  . Ringworm   . Vitamin D deficiency     Past Surgical History:  Procedure Laterality Date  . CARDIAC CATHETERIZATION N/A 07/23/2015   Procedure: Left Heart Cath and Coronary Angiography;  Surgeon: Belva Crome, MD;  Location: [place];  Service: Cardiovascular;  Laterality: N/A;  . CARDIOVERSION N/A 06/03/2022   Procedure: CARDIOVERSION;  Surgeon: Fay Records, MD;  Location: [facility];  Service: Cardiovascular;  Laterality: N/A;  . COLONOSCOPY WITH PROPOFOL N/A 11/24/2015   Procedure: COLONOSCOPY WITH PROPOFOL;  Surgeon: Garlan Fair, MD;  Location: [facility];  Service: Endoscopy;  Laterality: N/A;  . esophageal strictures with dilatations in the past per Dr. Delfin Edis     last '98  . HERNIA REPAIR Left   . LAD stent placement -  Dr. Daneen Schick    . left inguinal hernia repair - Dr. Magdalene River    . prostatic radioactive seed implantation - Dr. Valere Dross     '09    Family History  Problem Relation Age of Onset  . CVA Mother   . CVA Father 74  . CAD Sister     Social History Social History   Tobacco Use  . Smoking status: Never  . Smokeless tobacco: Never  Vaping Use  . Vaping Use: Never used  Substance Use Topics  . Alcohol use: Yes    Alcohol/week: 0.0 standard drinks of alcohol    Comment: occasionally for special occasions    . Drug use: No    No Known Allergies  Current Outpatient Medications  Medication Sig Dispense Refill  . amLODipine (NORVASC) 5 MG tablet Take 1 tablet (5 mg total) by mouth daily. 90 tablet 3  . atorvastatin (LIPITOR) 40 MG tablet Take 40 mg by mouth at bedtime.    . B Complex Vitamins (B COMPLEX 100) tablet Take 1 tablet by mouth daily. Balance    . benazepril-hydrochlorthiazide (LOTENSIN HCT) 20-12.5 MG tablet Take 1 tablet by mouth daily. 90 tablet 3  . cetirizine (ZYRTEC) 10 MG tablet Take 10 mg by mouth daily.    . Cholecalciferol (VITAMIN D3) 2000 UNITS TABS Take 2,000 Units by mouth daily.    . Coenzyme Q10 (CO Q-10) 100 MG CAPS Take 100  mg by mouth daily with supper.    . enoxaparin (LOVENOX) 80 MG/0.8ML injection Inject 0.8 mLs (80 mg total) into the skin every 12 (twelve) hours. 13.6 mL 0  . metoprolol succinate (TOPROL XL) 25 MG 24 hr tablet Take 1 tablet (25 mg total) by mouth daily. 90 tablet 3  . Multiple Vitamins-Minerals (CENTRUM) tablet Take 1 tablet by mouth daily.    . nitroGLYCERIN (NITROSTAT) 0.4 MG SL tablet PLACE 1 TABLET UNDER THE TONGUE IF NEEDED EVERY 5 MINUTES FOR CHEST PAIN FOR 3 DOSES IF NO RELIEF AFTER FIRST DOSE CALL 911. 25 tablet 9  . omeprazole (PRILOSEC) 20 MG capsule Take 20 mg by mouth every morning.    . warfarin (COUMADIN) 5 MG tablet TAKE 1/2 TO 1 TABLET BY MOUTH DAILY AS DIRECTED BY COUMADIN CLINIC 100 tablet 0   No current  facility-administered medications for this visit.    Review of Systems  {Ros - complete:30496}  Physical Exam  RAL:{CHL ONC PE [phone]} SKIN: {CHL ONC PE [id number] HEAD: {CHL ONC PE AE:[id number] EYES: {CHL ONC PE EY:[id number] EARS: {CHL ONC PE AC:[id number] OROPHARYNX:{CHL ONC PE OROPHARYNX:[phone]}  NECK: {CHL ONC PE FZ:[id number] LYMPH:  {CHL ONC PE RP:[id number] BREAST:{CHL ONC PE BREAST:[phone]} LUNGS: {CHL ONC PE LK:[id number] HEART: {CHL ONC PE LA:[id number] ABDOMEN:{CHL ONC PE [phone]} BACK: {CHL ONC PE YK:[id number] EXTREMITIES:{CHL ONC PE EXTREMITIES:[phone]}  NEURO: {CHL ONC PE NEURO:[phone]}  PERFORMANCE STATUS: ECOG ***  LABORATORY DATA: Lab Results  Component Value Date   WBC 10.0 10/05/2022   HGB 17.1 (H) 10/05/2022   HCT 48.8 10/05/2022   MCV 95.5 10/05/2022   PLT 173 10/05/2022      Chemistry      Component Value Date/Time   NA 136 10/05/2022 1112   NA 141 08/25/2022 1440   K 4.2 10/05/2022 1112   CL 101 10/05/2022 1112   CO2 32 10/05/2022 1112   BUN 14 10/05/2022 1112   BUN 14 08/25/2022 1440   CREATININE 1.07 10/05/2022 1112   CREATININE 1.23 (H) 07/21/2015 0917      Component Value Date/Time   CALCIUM 9.3 10/05/2022 1112   ALKPHOS 69 10/05/2022 1112   AST 25 10/05/2022 1112   ALT 31 10/05/2022 1112   BILITOT 1.7 (H) 10/05/2022 1112       RADIOGRAPHIC STUDIES: MR THORACIC SPINE W WO CONTRAST  Result Date: 09/28/2022 CLINICAL DATA:  Better [place] prostate cancer with abnormal PET CT and/. Your evaluate T10 talking EXAM: MRI THORACIC WITHOUT AND WITH CONTRAST TECHNIQUE: Multiplanar and multiecho pulse sequences of the thoracic spine were obtained without and with intravenous contrast. CONTRAST:  34m GADAVIST GADOBUTROL 1 MMOL/ML IV SOLN COMPARISON:  None Available. FINDINGS: Alignment:  Normal Vertebrae: 2.2 cm lesion in the vertebral body of T10. Subcentimeter lesions  also seen in the T4 spinous process, T9 posterior body, and T11 left para median body. No fracture or extraosseous tumor extension. Cord:  No evidence of intrathecal metastasis. Paraspinal and other soft tissues: Negative for paravertebral mass or inflammation. Disc levels: No significant degenerative change.  No neural impingement. 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 Additional subcentimeter deposits in the T4 spinous process and T9, T11 vertebral bodies. No fracture or soft tissue extension. Electronically Signed   By: JJorje GuildM.D.   On: 09/28/2022 20:44   NM PET Image Initial (PI) Skull Base To Thigh (F-18 FDG)  Result Date: 09/20/2022 CLINICAL DATA:  Initial treatment strategy for pulmonary nodules. History of prostate cancer. EXAM: NUCLEAR MEDICINE  PET SKULL BASE TO THIGH TECHNIQUE: 9.76 mCi F-18 FDG was injected intravenously. Full-ring PET imaging was performed from the skull base to thigh after the radiotracer. CT data was obtained and used for attenuation correction and anatomic localization. Fasting blood glucose: 104 mg/dl COMPARISON:  Chest CT 08/31/2022, cardiac CT 08/29/2022 and abdominal CT 10/20/2009. FINDINGS: Mediastinal blood pool activity: SUV max 1.7 NECK: No hypermetabolic cervical lymph nodes are identified.Physiologic activity within the muscles of phonation.No suspicious activity identified within the pharyngeal mucosal space. Incidental CT findings: none CHEST: There are no hypermetabolic mediastinal, hilar or axillary lymph nodes. No hypermetabolic pulmonary activity. Specifically, there is no hypermetabolic activity seen within the previously demonstrated small pulmonary nodules which measure up to 8 mm in the right lower lobe on image 48/7. Most of these nodules are too small to optimally evaluate by PET-CT. Incidental CT findings: Atherosclerosis of the aorta, great vessels and coronary arteries. There are calcifications at the left ventricular apex.  ABDOMEN/PELVIS: There is no hypermetabolic activity within the liver, adrenal glands, spleen or pancreas. There is no hypermetabolic nodal activity in the abdomen or pelvis. No hypermetabolic activity associated with the prostate gland. Incidental CT findings: Stable probable small cyst in the left hepatic lobe, without hypermetabolic activity and present on remote abdominal CT. Mild aortic and branch vessel atherosclerosis. Prostate brachytherapy seeds are noted. SKELETON: Previously demonstrated lytic lesion within the left aspect of the T10 vertebral body is intensely hypermetabolic with an SUV max of 16.9. This lesion measures 1.7 x 1.2 cm on image 56/7 and is new compared with the remote abdominal CT. There is an additional hypermetabolic lytic lesion in the left femoral neck which measures approximately 2.9 cm on image 204/4 and has an SUV max of 6.5. This lesion also appears new from the remote abdominal CT. No other hypermetabolic lesions are identified within the axial or proximal appendicular skeleton. There are no blastic lesions or pathologic fractures. Incidental CT findings: Mild spondylosis. IMPRESSION: 1. The recently demonstrated pulmonary nodules do not demonstrate any hypermetabolic activity, although are suboptimally evaluated due to size. Based on the morphology of the right lower lobe lesion and the accompanying osseous findings, metastatic disease is not excluded. Recommend CT follow-up in 3-6 months. 2. Hypermetabolic lytic lesions in the T10 vertebral body and left femoral neck concerning for metastatic disease or multiple myeloma. These lesions are not typical for metastatic prostate cancer. Based on location, the left femoral neck lesion is at risk for pathologic fracture. Recommend MRI of the thoracic spine without and with contrast for further evaluation. Tissue sampling likely warranted. 3. No other findings suspicious for metastatic disease. 4. Coronary and Aortic Atherosclerosis  (ICD10-I70.0). Electronically Signed   By: Richardean Sale M.D.   On: 09/20/2022 09:10    ASSESSMENT:   PLAN:  The patient voices understanding of current disease status and treatment options and is in agreement with the current care plan.  All questions were answered. The patient knows to call the clinic with any problems, questions or concerns. We can certainly see the patient much sooner if necessary.  Thank you so much for allowing me to participate in the care of Ronald Duran. I will continue to follow up the patient with you and assist in his care.  I spent {CHL ONC TIME VISIT - [id number] counseling the patient face to face. The total time spent in the appointment was {CHL ONC TIME VISIT - ZX:[id number].  Disclaimer: This note was dictated with voice recognition software. Similar sounding  words can inadvertently be transcribed and may not be corrected upon review.   Eilleen Kempf October 05, 2022, 11:58 AM

## 2022-10-06 ENCOUNTER — Other Ambulatory Visit: Payer: Self-pay | Admitting: Radiology

## 2022-10-06 DIAGNOSIS — M549 Dorsalgia, unspecified: Secondary | ICD-10-CM

## 2022-10-07 ENCOUNTER — Encounter: Payer: Self-pay | Admitting: Radiation Oncology

## 2022-10-07 ENCOUNTER — Ambulatory Visit (HOSPITAL_COMMUNITY)
Admission: RE | Admit: 2022-10-07 | Discharge: 2022-10-07 | Disposition: A | Discharge status: Home / Self Care | Payer: Medicare Other | Source: Ambulatory Visit | Attending: Pulmonary Disease | Admitting: Pulmonary Disease

## 2022-10-07 ENCOUNTER — Encounter: Payer: Self-pay | Admitting: Internal Medicine

## 2022-10-07 DIAGNOSIS — C7951 Secondary malignant neoplasm of bone: Secondary | ICD-10-CM

## 2022-10-07 LAB — PROSTATE-SPECIFIC AG, SERUM (LABCORP): Prostate Specific Ag, Serum: 0.4 ng/mL (ref 0.0–4.0)

## 2022-10-07 NOTE — Consult Note (Signed)
Chief Complaint: Patient was seen in consultation today for T10 vertebral body presumed metastatic lesion.  Referring Physician(s): Icard,Bradley L  Supervising Physician: Pedro Earls  Patient Status: [facility] - Out-pt  History of Present Illness: Ronald Duran is a 82 year old male presenting today for discussion ofT10 vertebral body core biopsy and possible radiofrequency ablation and kyphoplasty.  He has a past medical history significant for coronary artery disease, cardiomyopathy, dyslipidemia, hypertension, peripheral arterial disease, prediabetes, history of prostate cancer in 2008 status post seed implants and hormonal therapy, vitamin D deficiency and melanoma of the right arm.     In short, he underwent a cardiac CTA on 08/29/2022 as part of workup for atrial fibrillation with findings of incidental multiple pulmonary nodules and a T10 vertebral body lytic lesion.  FDG PET performed on 09/19/2022 and MRI of the thoracic spine performed on 09/27/2022 confirmed the presence of a hypermetabolic/enhancing lytic lesion in the T10 vertebral body, in addition to a left femoral neck lesion, concerning for metastatic disease versus multiple myeloma.  He is referred to [place] for a T10 core bone biopsy to establish a histological diagnosis.  In addition to the core biopsy, radiofrequency ablation and kyphoplasty are offered for local disease control.   Past Medical History:  Diagnosis Date   CAD (coronary artery disease)    a. s/p MI with LAD stent placement December, 1999 -last stress test November 2008 with no EKG evidence of ischemia  b. cath 07/23/2015 after abnormal ETT and nuc, occluded mid LAD stent with good distal collateral, medical therapy    Cardiomyopathy ([place])    Ischemic cardiomyopathy with EF 40% by echo 2005   Dyslipidemia    Erectile dysfunction    Hiatal hernia    with esophageal strictures and dilatations in the past - Dr. Delfin Edis   History of nuclear  stress test    Myoview 12/16: EF 47%, anterior, anteroseptal and inferoseptal defect suggesting infarct with peri-infarct ischemia, intermediate risk   HTN (hypertension)    Hx of seasonal allergies    Hyperlipidemia    PAD (peripheral artery disease) ([place])    Pre-diabetes    Prostate cancer ([place])    with seed implant therapy following Lupron therapy in 2008 and 2009 - followed by [name]   Ringworm    Vitamin D deficiency     Past Surgical History:  Procedure Laterality Date   CARDIAC CATHETERIZATION N/A 07/23/2015   Procedure: Left Heart Cath and Coronary Angiography;  Surgeon: Belva Crome, MD;  Location: [facility];  Service: Cardiovascular;  Laterality: N/A;   CARDIOVERSION N/A 06/03/2022   Procedure: CARDIOVERSION;  Surgeon: Fay Records, MD;  Location: [facility];  Service: Cardiovascular;  Laterality: N/A;   COLONOSCOPY WITH PROPOFOL N/A 11/24/2015   Procedure: COLONOSCOPY WITH PROPOFOL;  Surgeon: Garlan Fair, MD;  [facility];  Service: Endoscopy;  Laterality: N/A;   esophageal strictures with dilatations in the past per Dr. Delfin Edis     last '98   HERNIA REPAIR Left    LAD stent placement - Dr. Daneen Schick     left inguinal hernia repair - Dr. Magdalene River     prostatic radioactive seed implantation - Dr. Valere Dross     '09    Allergies: Patient has no known allergies.  Medications: Prior to Admission medications   Medication Sig Start Date End Date Taking? Authorizing Provider  amLODipine (NORVASC) 5 MG tablet Take 1 tablet (5 mg  total) by mouth daily. 11/01/21   Belva Crome, MD  atorvastatin (LIPITOR) 40 MG tablet Take 40 mg by mouth at bedtime. 04/22/17   [provider]  B Complex Vitamins (B COMPLEX 100) tablet Take 1 tablet by mouth daily. Balance    [provider]  benazepril-hydrochlorthiazide (LOTENSIN HCT) 20-12.5 MG tablet Take 1 tablet by mouth daily. 11/29/21   Belva Crome, MD  cetirizine (ZYRTEC) 10 MG  tablet Take 10 mg by mouth daily.    [provider]  Cholecalciferol (VITAMIN D3) 2000 UNITS TABS Take 2,000 Units by mouth daily.    [provider]  Coenzyme Q10 (CO Q-10) 100 MG CAPS Take 100 mg by mouth daily with supper.    [provider]  enoxaparin (LOVENOX) 80 MG/0.8ML injection Inject 0.8 mLs (80 mg total) into the skin every 12 (twelve) hours. Patient not taking: Reported on 10/05/2022 10/04/22   Mealor, Yetta Barre, MD  metoprolol succinate (TOPROL XL) 25 MG 24 hr tablet Take 1 tablet (25 mg total) by mouth daily. 09/20/22   Swinyer, Lanice Schwab, NP  Multiple Vitamins-Minerals (CENTRUM) tablet Take 1 tablet by mouth daily.    [provider]  nitroGLYCERIN (NITROSTAT) 0.4 MG SL tablet PLACE 1 TABLET UNDER THE TONGUE IF NEEDED EVERY 5 MINUTES FOR CHEST PAIN FOR 3 DOSES IF NO RELIEF AFTER FIRST DOSE CALL 911. Patient not taking: Reported on 10/05/2022 01/26/22   Belva Crome, MD  omeprazole (PRILOSEC) 20 MG capsule Take 20 mg by mouth every morning. 06/28/22   [provider]  warfarin (COUMADIN) 5 MG tablet TAKE 1/2 TO 1 TABLET BY MOUTH DAILY AS DIRECTED BY COUMADIN CLINIC Patient not taking: Reported on 10/05/2022 09/21/22   Jerline Pain, MD     Family History  Problem Relation Age of Onset   CVA Mother    CVA Father 21   CAD Sister     Social History   Socioeconomic History   Marital status: Married    Spouse name: Not on file   Number of children: Not on file   Years of education: Not on file   Highest education level: Not on file  Occupational History   Not on file  Tobacco Use   Smoking status: Never   Smokeless tobacco: Never  Vaping Use   Vaping Use: Never used  Substance and Sexual Activity   Alcohol use: Yes    Alcohol/week: 0.0 standard drinks of alcohol    Comment: occasionally for special occasions     Drug use: No   Sexual activity: Not on file  Other Topics Concern   Not on file  Social History Narrative    Not on file   Social Determinants of Health   Financial Resource Strain: Not on file  Food Insecurity: Not on file  Transportation Needs: Not on file  Physical Activity: Not on file  Stress: Not on file  Social Connections: Not on file     Review of Systems: A 12 point ROS discussed and pertinent positives are indicated in the HPI above.  All other systems are negative.  Review of Systems  Vital Signs: There were no vitals taken for this visit.  Physical Exam Constitutional:      General: He is not in acute distress.    Appearance: Normal appearance.  Neurological:     Mental Status: He is alert and oriented to person, place, and time. Mental status is at baseline.  Imaging: MR THORACIC SPINE W WO CONTRAST  Result Date: 09/28/2022 CLINICAL DATA:  Better [place] prostate cancer with abnormal PET CT and/. Your evaluate T10 talking EXAM: MRI THORACIC WITHOUT AND WITH CONTRAST TECHNIQUE: Multiplanar and multiecho pulse sequences of the thoracic spine were obtained without and with intravenous contrast. CONTRAST:  57m GADAVIST GADOBUTROL 1 MMOL/ML IV SOLN COMPARISON:  None Available. FINDINGS: Alignment:  Normal Vertebrae: 2.2 cm lesion in the vertebral body of T10. Subcentimeter lesions also seen in the T4 spinous process, T9 posterior body, and T11 left para median body. No fracture or extraosseous tumor extension. Cord:  No evidence of intrathecal metastasis. Paraspinal and other soft tissues: Negative for paravertebral mass or inflammation. Disc levels: No significant degenerative change.  No neural impingement. 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 Additional subcentimeter deposits in the T4 spinous process and T9, T11 vertebral bodies. No fracture or soft tissue extension. Electronically Signed   By: JJorje GuildM.D.   On: 09/28/2022 20:44   NM PET Image Initial (PI) Skull Base To Thigh (F-18 FDG)  Result Date: 09/20/2022 CLINICAL DATA:  Initial treatment  strategy for pulmonary nodules. History of prostate cancer. EXAM: NUCLEAR MEDICINE PET SKULL BASE TO THIGH TECHNIQUE: 9.76 mCi F-18 FDG was injected intravenously. Full-ring PET imaging was performed from the skull base to thigh after the radiotracer. CT data was obtained and used for attenuation correction and anatomic localization. Fasting blood glucose: 104 mg/dl COMPARISON:  Chest CT 08/31/2022, cardiac CT 08/29/2022 and abdominal CT 10/20/2009. FINDINGS: Mediastinal blood pool activity: SUV max 1.7 NECK: No hypermetabolic cervical lymph nodes are identified.Physiologic activity within the muscles of phonation.No suspicious activity identified within the pharyngeal mucosal space. Incidental CT findings: none CHEST: There are no hypermetabolic mediastinal, hilar or axillary lymph nodes. No hypermetabolic pulmonary activity. Specifically, there is no hypermetabolic activity seen within the previously demonstrated small pulmonary nodules which measure up to 8 mm in the right lower lobe on image 48/7. Most of these nodules are too small to optimally evaluate by PET-CT. Incidental CT findings: Atherosclerosis of the aorta, great vessels and coronary arteries. There are calcifications at the left ventricular apex. ABDOMEN/PELVIS: There is no hypermetabolic activity within the liver, adrenal glands, spleen or pancreas. There is no hypermetabolic nodal activity in the abdomen or pelvis. No hypermetabolic activity associated with the prostate gland. Incidental CT findings: Stable probable small cyst in the left hepatic lobe, without hypermetabolic activity and present on remote abdominal CT. Mild aortic and branch vessel atherosclerosis. Prostate brachytherapy seeds are noted. SKELETON: Previously demonstrated lytic lesion within the left aspect of the T10 vertebral body is intensely hypermetabolic with an SUV max of 16.9. This lesion measures 1.7 x 1.2 cm on image 56/7 and is new compared with the remote abdominal CT.  There is an additional hypermetabolic lytic lesion in the left femoral neck which measures approximately 2.9 cm on image 204/4 and has an SUV max of 6.5. This lesion also appears new from the remote abdominal CT. No other hypermetabolic lesions are identified within the axial or proximal appendicular skeleton. There are no blastic lesions or pathologic fractures. Incidental CT findings: Mild spondylosis. IMPRESSION: 1. The recently demonstrated pulmonary nodules do not demonstrate any hypermetabolic activity, although are suboptimally evaluated due to size. Based on the morphology of the right lower lobe lesion and the accompanying osseous findings, metastatic disease is not excluded. Recommend CT follow-up in 3-6 months. 2. Hypermetabolic lytic lesions in the T10 vertebral body and left femoral neck  concerning for metastatic disease or multiple myeloma. These lesions are not typical for metastatic prostate cancer. Based on location, the left femoral neck lesion is at risk for pathologic fracture. Recommend MRI of the thoracic spine without and with contrast for further evaluation. Tissue sampling likely warranted. 3. No other findings suspicious for metastatic disease. 4. Coronary and Aortic Atherosclerosis (ICD10-I70.0). Electronically Signed   By: Richardean Sale M.D.   On: 09/20/2022 09:10    Labs:  CBC: Recent Labs    05/26/22 1534 08/25/22 1439 10/05/22 1112  WBC 8.0 7.3 10.0  HGB 17.3 16.5 17.1*  HCT 49.0 47.6 48.8  PLT 138* 145* 173    COAGS: Recent Labs    08/11/22 1106 08/18/22 0848 08/25/22 1422 09/21/22 1110  INR 2.6 3.5 2.2 2.2    BMP: Recent Labs    05/26/22 1535 08/25/22 1440 10/05/22 1112  NA 143 141 136  K 4.1 4.1 4.2  CL 102 101 101  CO2 26 25 32  GLUCOSE 97 96 95  BUN '17 14 14  '$ CALCIUM 9.7 9.3 9.3  CREATININE 1.12 1.08 1.07  GFRNONAA  --   --  >60    LIVER FUNCTION TESTS: Recent Labs    10/05/22 1112  BILITOT 1.7*  AST 25  ALT 31  ALKPHOS 69  PROT  7.5  ALBUMIN 3.9    TUMOR MARKERS: No results for input(s): "AFPTM", "CEA", "CA199", "CHROMGRNA" in the last 8760 hours.  Assessment and Plan:  I discussed the technical details, risks an benefits of performing the T10 core bone biopsy, radiofrequency ablation and kyphoplasty with Ronald Duran and his wife.  They understood and asked appropriate questions.  He is interested in pursuing the RFA/kyphoplasty in addition to the core biopsy.  His procedure is scheduled for Tuesday October 11, 2022 under moderate sedation.  He stopped his warfarin and started Lovenox bridging today in anticipation to the procedure.   Thank you for this interesting consult.  I greatly enjoyed meeting Ronald Duran and look forward to participating in his care.  A copy of this report was sent to the requesting provider on this date.  Electronically Signed: Pedro Earls, MD 10/07/2022, 3:22 PM   I spent a total of  40 Minutes   in face to face in clinical consultation, greater than 50% of which was counseling/coordinating care for T10 spinal lesion.

## 2022-10-07 NOTE — Progress Notes (Incomplete)
Thoracic Location of Tumor / Histology: Multiple lung nodules right lower lobe.  Hypermetabolic lytic lesions in the T10 vertebral body and left femoral neck.  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 Risa Duran)  08/31/2022 Dr. Doralee Duran CT Chest without Contrast CLINICAL DATA:  Follow-up pulmonary nodules  FINDINGS: Cardiovascular: No acute finding. Coronary artery calcification and aortic atherosclerotic calcification are present. See results of prior cardiac exam.   Mediastinum/Nodes: No suspicion of mediastinal or hilar mass or adenopathy based on this study done without contrast.   Lungs/Pleura: No pleural effusion or pleural mass.   Mild pleural and parenchymal scarring at both lung apices.   No pulmonary nodule or mass in the left lung.   No new abnormality identified in the right lung. Likely benign small nodules as follows. Peri fissural nodule axial image 83 measuring 3 mm. Subpleural nodule right lower lobe axial image 103 measuring 4 mm. Subpleural right middle lobe nodule axial image 104 measuring 4 mm.   Concerning spiculated density in the right lower lobe with the epicenter on axial image 111 measuring up to 10 mm in diameter. This is associated with bronchial obliteration and mild distal volume loss. This is viewed with concern. Consider one of the following in 3 months for both low-risk and high-risk individuals: (a) repeat chest CT, (b) follow-up PET-CT, or (c) tissue sampling. This recommendation follows the consensus statement: Guidelines for Management of Incidental Pulmonary Nodules Detected on CT Images: From the Fleischner Society 2017; Radiology 2017; 284:228-243. This evaluation could proceed immediately if desired, but three-month follow-up is also reasonable.   Upper Abdomen: Nonspecific 7 mm low-density in the left lobe of the liver, likely benign. No other upper abdominal finding.   Musculoskeletal:  Lucent lesion of the T10 vertebral body, quite likely a benign lipoma or hemangioma. No suspicious bone finding.   IMPRESSION: 1. Concerning spiculated density in the right lower lobe measuring up to 10 mm in diameter. This is associated with bronchial obliteration and mild distal volume loss. This is viewed with concern. Further evaluation as described above could be undertaken immediately, or follow-up CT could be performed in no more than 3 months. 2. Aortic atherosclerosis.    09/19/2022 Dr. June Duran NM PET Image Initial (PI) Skull Base to Mid Thigh CLINICAL DATA:  Initial treatment strategy for pulmonary nodules.  History of prostate cancer.  FINDINGS: Mediastinal blood pool activity: SUV max 1.7   NECK: No hypermetabolic cervical lymph nodes are identified.Physiologic activity within the muscles of phonation.No suspicious activity identified within the pharyngeal mucosal space.   Incidental CT findings: none   CHEST: There are no hypermetabolic mediastinal, hilar or axillary lymph nodes. No hypermetabolic pulmonary activity. Specifically, there is no hypermetabolic activity seen within the previously demonstrated small pulmonary nodules which measure up to 8 mm in the right lower lobe on image 48/7. Most of these nodules are too small to optimally evaluate by PET-CT.   Incidental CT findings: Atherosclerosis of the aorta, great vessels and coronary arteries. There are calcifications at the left ventricular apex.   ABDOMEN/PELVIS: There is no hypermetabolic activity within the liver, adrenal glands, spleen or pancreas. There is no hypermetabolic nodal activity in the abdomen or pelvis. No hypermetabolic activity associated with the prostate gland.   Incidental CT findings: Stable probable small cyst in the left hepatic lobe, without hypermetabolic activity and present on remote abdominal CT. Mild aortic and branch vessel atherosclerosis.  Prostate brachytherapy seeds are  noted.   SKELETON: Previously demonstrated lytic lesion within the left aspect of the T10 vertebral body is intensely hypermetabolic with an SUV max of 16.9. This lesion measures 1.7 x 1.2 cm on image 56/7 and is new compared with the remote abdominal CT. There is an additional hypermetabolic lytic lesion in the left femoral neck which measures approximately 2.9 cm on image 204/4 and has an SUV max of 6.5. This lesion also appears new from the remote abdominal CT. No other hypermetabolic lesions are identified within the axial or proximalappendicular skeleton. There are no blastic lesions or pathologicfractures.   Incidental CT findings: Mild spondylosis.   IMPRESSION: 1. The recently demonstrated pulmonary nodules do not demonstrate any hypermetabolic activity, although are suboptimally evaluated due to size. Based on the morphology of the right lower lobe lesion and the accompanying osseous findings, metastatic disease is not excluded. Recommend CT follow-up in 3-6 months. 2. Hypermetabolic lytic lesions in the T10 vertebral body and left femoral neck concerning for metastatic disease or multiple myeloma. These lesions are not typical for metastatic prostate cancer. Based on location, the left femoral neck lesion is at risk for pathologic fracture. Recommend MRI of the thoracic spine without and with contrast for further evaluation. Tissue sampling likely warranted. 3. No other findings suspicious for metastatic disease. 4. Coronary and Aortic Atherosclerosis (ICD10-I70.0).   2/202/2024 Dr. Leory Plowman Duran MR Thoracic Spine with/without Contrast CLINICAL DATA:  Better Ronald Duran prostate cancer with abnormal PET CT and. Your evaluate T10 talking  FINDINGS: Alignment:  Normal   Vertebrae: 2.2 cm lesion in the vertebral body of T10. Subcentimeter lesions also seen in the T4 spinous process, T9 posterior body, and T11 left para median body. No fracture or extraosseous tumor extension.   Cord:  No  evidence of intrathecal metastasis.   Paraspinal and other soft tissues: Negative for paravertebral mass or inflammation.   Disc levels:   No significant degenerative change.  No neural impingement.   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 Additional subcentimeter deposits in the T4 spinous process and T9, T11 vertebral bodies. No fracture or soft tissue extension.    Tobacco/Marijuana/Snuff/ETOH use: No tobacco, drug, or smokeless tobacco use. Occasional alcohol use.  Past/Anticipated interventions by cardiothoracic surgery, if any: NA  Past/Anticipated interventions by medical oncology, if any:   Signs/Symptoms Weight changes, if any: {:18581} Respiratory complaints, if any: {:18581} Hemoptysis, if any: {:18581} Pain issues, if any:  {:18581}  SAFETY ISSUES: Prior radiation? {:18581} Pacemaker/ICD? {:18581}   Possible current pregnancy? Male Is the patient on methotrexate?   Current Complaints / other details:

## 2022-10-08 NOTE — H&P (Incomplete)
Chief Complaint: Patient was seen in consultation today for T10 vertebral body lesion at the request of [place] L  Referring Physician(s): Icard,Bradley L  Supervising Physician: Pedro Earls  Patient Status: [facility] - Out-pt  History of Present Illness: Ronald Duran is a 82 y.o. male with PMHs of HTN, HLD, CAD s/p stent placement in 1999, cardiomyopathy, prostate CA and T10 vertebral body lesion who presents for bone biopsy, radiofrequency ablation and kyphoplasty.   Patient underwent CTA on 08/29/2022 as part of workup for atrial fibrillation with findings of incidental multiple pulmonary nodules and a T10 vertebral body lytic lesion. PET scan on 09/20/22 showed no hypermetabolic pulmonary nodules but hypermetabolic lytic lesions in the T10 vertebral body and left femoral neck concerning for metastatic disease or multiple myeloma. 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 As well as subcentimeter deposits in the T4 spinous process and T9, T11 vertebral bodies.   Patient was referred to Dr. Karenann Cai to discuss T10 vertebral body core biopsy and possible radiofrequency ablation and kyphoplasty. He had a consultation visit on on 10/07/22 and was deeded a good candidate for the procedure, he was instructed to stop his warfarin and start Lovenox bridge.   Patient presents to [place] for T10 vertebral body bx, radiofrequency ablation, and KP.   ***   Past Medical History:  Diagnosis Date   CAD (coronary artery disease)    a. s/p MI with LAD stent placement December, 1999 -last stress test November 2008 with no EKG evidence of ischemia  b. cath 07/23/2015 after abnormal ETT and nuc, occluded mid LAD stent with good distal collateral, medical therapy    Cardiomyopathy ([place])    Ischemic cardiomyopathy with EF 40% by echo 2005   Dyslipidemia    Erectile dysfunction    Hiatal hernia    with esophageal strictures and dilatations in the  past - Dr. Delfin Edis   History of nuclear stress test    Myoview 12/16: EF 47%, anterior, anteroseptal and inferoseptal defect suggesting infarct with peri-infarct ischemia, intermediate risk   HTN (hypertension)    Hx of seasonal allergies    Hyperlipidemia    PAD (peripheral artery disease) ([place])    Pre-diabetes    Prostate cancer ([place])    with seed implant therapy following Lupron therapy in 2008 and 2009 - followed by [name]   Ringworm    Vitamin D deficiency     Past Surgical History:  Procedure Laterality Date   CARDIAC CATHETERIZATION N/A 07/23/2015   Procedure: Left Heart Cath and Coronary Angiography;  Surgeon: Belva Crome, MD;  Location: [facility];  Service: Cardiovascular;  Laterality: N/A;   CARDIOVERSION N/A 06/03/2022   Procedure: CARDIOVERSION;  Surgeon: Fay Records, MD;  Location: [facility];  Service: Cardiovascular;  Laterality: N/A;   COLONOSCOPY WITH PROPOFOL N/A 11/24/2015   Procedure: COLONOSCOPY WITH PROPOFOL;  Surgeon: Garlan Fair, MD;  Location: [facility];  Service: Endoscopy;  Laterality: N/A;   esophageal strictures with dilatations in the past per Dr. Delfin Edis     last '98   HERNIA REPAIR Left    LAD stent placement - Dr. Daneen Schick     left inguinal hernia repair - Dr. Magdalene River     prostatic radioactive seed implantation - Dr. Valere Dross     '09    Allergies: Patient has no known allergies.  Medications: Prior to Admission medications   Medication  Sig Start Date End Date Taking? Authorizing Provider  amLODipine (NORVASC) 5 MG tablet Take 1 tablet (5 mg total) by mouth daily. 11/01/21   Belva Crome, MD  atorvastatin (LIPITOR) 40 MG tablet Take 40 mg by mouth at bedtime. 04/22/17   [provider]  B Complex Vitamins (B COMPLEX 100) tablet Take 1 tablet by mouth daily. Balance    [provider]  benazepril-hydrochlorthiazide (LOTENSIN HCT) 20-12.5 MG tablet Take 1 tablet by mouth daily. 11/29/21    Belva Crome, MD  cetirizine (ZYRTEC) 10 MG tablet Take 10 mg by mouth daily.    [provider]  Cholecalciferol (VITAMIN D3) 2000 UNITS TABS Take 2,000 Units by mouth daily.    [provider]  Coenzyme Q10 (CO Q-10) 100 MG CAPS Take 100 mg by mouth daily with supper.    [provider]  enoxaparin (LOVENOX) 80 MG/0.8ML injection Inject 0.8 mLs (80 mg total) into the skin every 12 (twelve) hours. Patient not taking: Reported on 10/05/2022 10/04/22   Mealor, Yetta Barre, MD  metoprolol succinate (TOPROL XL) 25 MG 24 hr tablet Take 1 tablet (25 mg total) by mouth daily. 09/20/22   Swinyer, Lanice Schwab, NP  Multiple Vitamins-Minerals (CENTRUM) tablet Take 1 tablet by mouth daily.    [provider]  nitroGLYCERIN (NITROSTAT) 0.4 MG SL tablet PLACE 1 TABLET UNDER THE TONGUE IF NEEDED EVERY 5 MINUTES FOR CHEST PAIN FOR 3 DOSES IF NO RELIEF AFTER FIRST DOSE CALL 911. Patient not taking: Reported on 10/05/2022 01/26/22   Belva Crome, MD  omeprazole (PRILOSEC) 20 MG capsule Take 20 mg by mouth every morning. 06/28/22   [provider]  warfarin (COUMADIN) 5 MG tablet TAKE 1/2 TO 1 TABLET BY MOUTH DAILY AS DIRECTED BY COUMADIN CLINIC Patient not taking: Reported on 10/05/2022 09/21/22   Jerline Pain, MD     Family History  Problem Relation Age of Onset   CVA Mother    CVA Father 37   CAD Sister     Social History   Socioeconomic History   Marital status: Married    Spouse name: Not on file   Number of children: Not on file   Years of education: Not on file   Highest education level: Not on file  Occupational History   Not on file  Tobacco Use   Smoking status: Never   Smokeless tobacco: Never  Vaping Use   Vaping Use: Never used  Substance and Sexual Activity   Alcohol use: Yes    Alcohol/week: 0.0 standard drinks of alcohol    Comment: occasionally for special occasions     Drug use: No   Sexual activity: Not on file  Other Topics  Concern   Not on file  Social History Narrative   Not on file   Social Determinants of Health   Financial Resource Strain: Not on file  Food Insecurity: Not on file  Transportation Needs: Not on file  Physical Activity: Not on file  Stress: Not on file  Social Connections: Not on file     Review of Systems: A 12 point ROS discussed and pertinent positives are indicated in the HPI above.  All other systems are negative.  Vital Signs: There were no vitals taken for this visit.  *** Physical Exam     Imaging: MR THORACIC SPINE W WO CONTRAST  Result Date: 09/28/2022 CLINICAL DATA:  Better Ulyess Mort prostate cancer with abnormal PET CT and/. Your evaluate T10 talking  EXAM: MRI THORACIC WITHOUT AND WITH CONTRAST TECHNIQUE: Multiplanar and multiecho pulse sequences of the thoracic spine were obtained without and with intravenous contrast. CONTRAST:  82m GADAVIST GADOBUTROL 1 MMOL/ML IV SOLN COMPARISON:  None Available. FINDINGS: Alignment:  Normal Vertebrae: 2.2 cm lesion in the vertebral body of T10. Subcentimeter lesions also seen in the T4 spinous process, T9 posterior body, and T11 left para median body. No fracture or extraosseous tumor extension. Cord:  No evidence of intrathecal metastasis. Paraspinal and other soft tissues: Negative for paravertebral mass or inflammation. Disc levels: No significant degenerative change.  No neural impingement. 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 Additional subcentimeter deposits in the T4 spinous process and T9, T11 vertebral bodies. No fracture or soft tissue extension. Electronically Signed   By: JJorje GuildM.D.   On: 09/28/2022 20:44   NM PET Image Initial (PI) Skull Base To Thigh (F-18 FDG)  Result Date: 09/20/2022 CLINICAL DATA:  Initial treatment strategy for pulmonary nodules. History of prostate cancer. EXAM: NUCLEAR MEDICINE PET SKULL BASE TO THIGH TECHNIQUE: 9.76 mCi F-18 FDG was injected intravenously. Full-ring PET imaging was  performed from the skull base to thigh after the radiotracer. CT data was obtained and used for attenuation correction and anatomic localization. Fasting blood glucose: 104 mg/dl COMPARISON:  Chest CT 08/31/2022, cardiac CT 08/29/2022 and abdominal CT 10/20/2009. FINDINGS: Mediastinal blood pool activity: SUV max 1.7 NECK: No hypermetabolic cervical lymph nodes are identified.Physiologic activity within the muscles of phonation.No suspicious activity identified within the pharyngeal mucosal space. Incidental CT findings: none CHEST: There are no hypermetabolic mediastinal, hilar or axillary lymph nodes. No hypermetabolic pulmonary activity. Specifically, there is no hypermetabolic activity seen within the previously demonstrated small pulmonary nodules which measure up to 8 mm in the right lower lobe on image 48/7. Most of these nodules are too small to optimally evaluate by PET-CT. Incidental CT findings: Atherosclerosis of the aorta, great vessels and coronary arteries. There are calcifications at the left ventricular apex. ABDOMEN/PELVIS: There is no hypermetabolic activity within the liver, adrenal glands, spleen or pancreas. There is no hypermetabolic nodal activity in the abdomen or pelvis. No hypermetabolic activity associated with the prostate gland. Incidental CT findings: Stable probable small cyst in the left hepatic lobe, without hypermetabolic activity and present on remote abdominal CT. Mild aortic and branch vessel atherosclerosis. Prostate brachytherapy seeds are noted. SKELETON: Previously demonstrated lytic lesion within the left aspect of the T10 vertebral body is intensely hypermetabolic with an SUV max of 16.9. This lesion measures 1.7 x 1.2 cm on image 56/7 and is new compared with the remote abdominal CT. There is an additional hypermetabolic lytic lesion in the left femoral neck which measures approximately 2.9 cm on image 204/4 and has an SUV max of 6.5. This lesion also appears new from the  remote abdominal CT. No other hypermetabolic lesions are identified within the axial or proximal appendicular skeleton. There are no blastic lesions or pathologic fractures. Incidental CT findings: Mild spondylosis. IMPRESSION: 1. The recently demonstrated pulmonary nodules do not demonstrate any hypermetabolic activity, although are suboptimally evaluated due to size. Based on the morphology of the right lower lobe lesion and the accompanying osseous findings, metastatic disease is not excluded. Recommend CT follow-up in 3-6 months. 2. Hypermetabolic lytic lesions in the T10 vertebral body and left femoral neck concerning for metastatic disease or multiple myeloma. These lesions are not typical for metastatic prostate cancer. Based on location, the left femoral neck lesion is at risk  for pathologic fracture. Recommend MRI of the thoracic spine without and with contrast for further evaluation. Tissue sampling likely warranted. 3. No other findings suspicious for metastatic disease. 4. Coronary and Aortic Atherosclerosis (ICD10-I70.0). Electronically Signed   By: Richardean Sale M.D.   On: 09/20/2022 09:10    Labs:  CBC: Recent Labs    05/26/22 1534 08/25/22 1439 10/05/22 1112  WBC 8.0 7.3 10.0  HGB 17.3 16.5 17.1*  HCT 49.0 47.6 48.8  PLT 138* 145* 173    COAGS: Recent Labs    08/11/22 1106 08/18/22 0848 08/25/22 1422 09/21/22 1110  INR 2.6 3.5 2.2 2.2    BMP: Recent Labs    05/26/22 1535 08/25/22 1440 10/05/22 1112  NA 143 141 136  K 4.1 4.1 4.2  CL 102 101 101  CO2 26 25 32  GLUCOSE 97 96 95  BUN '17 14 14  '$ CALCIUM 9.7 9.3 9.3  CREATININE 1.12 1.08 1.07  GFRNONAA  --   --  >60    LIVER FUNCTION TESTS: Recent Labs    10/05/22 1112  BILITOT 1.7*  AST 25  ALT 31  ALKPHOS 69  PROT 7.5  ALBUMIN 3.9    TUMOR MARKERS: No results for input(s): "AFPTM", "CEA", "CA199", "CHROMGRNA" in the last 8760 hours.  Assessment and Plan: 82 y.o. male with T 10 vertebral body  lesion concerning for metastatic disease who presents for T10 vertebral body bx, radiofrequency ablation, and KP.   NPO since MN VSS CBC *** INR *** Warfarin was stopped on ***, last Lovenox on ***.   Risks and benefits of T 10 vertebral body bx,radiofrequency ablation, and KP  were discussed with the patient including, but not limited to education regarding the natural healing process of compression fractures without intervention, bleeding, infection, low yield,cement migration which may cause spinal cord damage, paralysis, pulmonary embolism or even death.  This interventional procedure involves the use of X-rays and because of the nature of the planned procedure, it is possible that we will have prolonged use of X-ray fluoroscopy.  Potential radiation risks to you include (but are not limited to) the following: - A slightly elevated risk for cancer  several years later in life. This risk is typically less than 0.5% percent. This risk is low in comparison to the normal incidence of human cancer, which is 33% for women and 50% for men according to the [place]. - Radiation induced injury can include skin redness, resembling a rash, tissue breakdown / ulcers and hair loss (which can be temporary or permanent).   The likelihood of either of these occurring depends on the difficulty of the procedure and whether you are sensitive to radiation due to previous procedures, disease, or genetic conditions.   IF your procedure requires a prolonged use of radiation, you will be notified and given written instructions for further action.  It is your responsibility to monitor the irradiated area for the 2 weeks following the procedure and to notify your physician if you are concerned that you have suffered a radiation induced injury.    All of the patient's questions were answered, patient is agreeable to proceed.  Consent signed and in chart.   Thank you for this interesting consult.   I greatly enjoyed meeting Ronald Duran and look forward to participating in their care.  A copy of this report was sent to the requesting provider on this date.  Electronically Signed: Tera Mater, PA-C 10/08/2022, 8:40 PM   I  spent a total of    25 Minutes in face to face in clinical consultation, greater than 50% of which was counseling/coordinating care for T 10 vertebral body bx,radiofrequency ablation, and KP.  This chart was dictated using voice recognition software.  Despite best efforts to proofread,  errors can occur which can change the documentation meaning.

## 2022-10-09 ENCOUNTER — Encounter: Payer: Self-pay | Admitting: Internal Medicine

## 2022-10-10 ENCOUNTER — Other Ambulatory Visit (HOSPITAL_COMMUNITY): Payer: Self-pay | Admitting: Physician Assistant

## 2022-10-10 ENCOUNTER — Other Ambulatory Visit: Payer: Self-pay | Admitting: Student

## 2022-10-10 ENCOUNTER — Telehealth: Payer: Self-pay | Admitting: Pharmacist

## 2022-10-10 ENCOUNTER — Encounter: Payer: Self-pay | Admitting: Medical Oncology

## 2022-10-10 NOTE — Progress Notes (Signed)
Pts email cc to Dr Julien Nordmann regarding ablation

## 2022-10-10 NOTE — Telephone Encounter (Signed)
Patient called. Typically eats broccoli every other day and wanted to know if he should not eat any after his procedure. Recommend he be consistent with his broccoli intake. Patient voiced understanding.

## 2022-10-11 ENCOUNTER — Ambulatory Visit (HOSPITAL_COMMUNITY): Admission: RE | Admit: 2022-10-11 | Payer: Medicare Other | Source: Ambulatory Visit

## 2022-10-11 ENCOUNTER — Ambulatory Visit (HOSPITAL_COMMUNITY)
Admission: RE | Admit: 2022-10-11 | Discharge: 2022-10-11 | Disposition: A | Discharge status: Home / Self Care | Payer: Medicare Other | Source: Ambulatory Visit | Attending: Pulmonary Disease | Admitting: Pulmonary Disease

## 2022-10-11 ENCOUNTER — Other Ambulatory Visit: Payer: Self-pay

## 2022-10-11 ENCOUNTER — Other Ambulatory Visit: Payer: Self-pay | Admitting: Pulmonary Disease

## 2022-10-11 ENCOUNTER — Encounter (HOSPITAL_COMMUNITY): Payer: Self-pay

## 2022-10-11 DIAGNOSIS — I4891 Unspecified atrial fibrillation: Secondary | ICD-10-CM | POA: Insufficient documentation

## 2022-10-11 DIAGNOSIS — I255 Ischemic cardiomyopathy: Secondary | ICD-10-CM | POA: Insufficient documentation

## 2022-10-11 DIAGNOSIS — Z7901 Long term (current) use of anticoagulants: Secondary | ICD-10-CM | POA: Insufficient documentation

## 2022-10-11 DIAGNOSIS — X58XXXA Exposure to other specified factors, initial encounter: Secondary | ICD-10-CM | POA: Insufficient documentation

## 2022-10-11 DIAGNOSIS — Z8546 Personal history of malignant neoplasm of prostate: Secondary | ICD-10-CM | POA: Insufficient documentation

## 2022-10-11 DIAGNOSIS — C7951 Secondary malignant neoplasm of bone: Secondary | ICD-10-CM

## 2022-10-11 DIAGNOSIS — M549 Dorsalgia, unspecified: Secondary | ICD-10-CM

## 2022-10-11 DIAGNOSIS — I2510 Atherosclerotic heart disease of native coronary artery without angina pectoris: Secondary | ICD-10-CM | POA: Diagnosis not present

## 2022-10-11 DIAGNOSIS — I10 Essential (primary) hypertension: Secondary | ICD-10-CM | POA: Diagnosis not present

## 2022-10-11 DIAGNOSIS — C9030 Solitary plasmacytoma not having achieved remission: Secondary | ICD-10-CM | POA: Insufficient documentation

## 2022-10-11 DIAGNOSIS — S22070A Wedge compression fracture of T9-T10 vertebra, initial encounter for closed fracture: Secondary | ICD-10-CM

## 2022-10-11 DIAGNOSIS — Z955 Presence of coronary angioplasty implant and graft: Secondary | ICD-10-CM | POA: Insufficient documentation

## 2022-10-11 DIAGNOSIS — E785 Hyperlipidemia, unspecified: Secondary | ICD-10-CM | POA: Insufficient documentation

## 2022-10-11 HISTORY — PX: IR BONE TUMOR(S)RF ABLATION: IMG2284

## 2022-10-11 HISTORY — PX: IR KYPHO THORACIC WITH BONE BIOPSY: IMG5518

## 2022-10-11 LAB — PROTIME-INR
INR: 1.1 (ref 0.8–1.2)
Prothrombin Time: 13.9 seconds (ref 11.4–15.2)

## 2022-10-11 LAB — BASIC METABOLIC PANEL
Anion gap: 12 (ref 5–15)
BUN: 18 mg/dL (ref 8–23)
CO2: 25 mmol/L (ref 22–32)
Calcium: 9.2 mg/dL (ref 8.9–10.3)
Chloride: 102 mmol/L (ref 98–111)
Creatinine, Ser: 1.07 mg/dL (ref 0.61–1.24)
GFR, Estimated: >60 mL/min (ref >60)
Glucose, Bld: 88 mg/dL (ref 70–99)
Potassium: 3.4 mmol/L — ABNORMAL LOW (ref 3.5–5.1)
Sodium: 139 mmol/L (ref 135–145)

## 2022-10-11 LAB — CBC
HCT: 48.7 % (ref 39.0–52.0)
Hemoglobin: 16.9 g/dL (ref 13.0–17.0)
MCH: 34 pg (ref 26.0–34.0)
MCHC: 34.7 g/dL (ref 30.0–36.0)
MCV: 98 fL (ref 80.0–100.0)
Platelets: 156 10*3/uL (ref 150–400)
RBC: 4.97 MIL/uL (ref 4.22–5.81)
RDW: 13.5 % (ref 11.5–15.5)
WBC: 9.6 10*3/uL (ref 4.0–10.5)
nRBC: 0 % (ref 0.0–0.2)

## 2022-10-11 MED ORDER — LIDOCAINE HCL (PF) 1 % IJ SOLN
INTRAMUSCULAR | Status: AC
  Filled 2022-10-11: qty 30

## 2022-10-11 MED ORDER — CEFAZOLIN SODIUM-DEXTROSE 2-4 GM/100ML-% IV SOLN
INTRAVENOUS | Status: AC | PRN
  Administered 2022-10-11: 2 g via INTRAVENOUS

## 2022-10-11 MED ORDER — MIDAZOLAM HCL 2 MG/2ML IJ SOLN
INTRAMUSCULAR | Status: AC | PRN
  Administered 2022-10-11: .5 mg via INTRAVENOUS
  Administered 2022-10-11: 1 mg via INTRAVENOUS
  Administered 2022-10-11: .5 mg via INTRAVENOUS

## 2022-10-11 MED ORDER — FENTANYL CITRATE (PF) 100 MCG/2ML IJ SOLN
INTRAMUSCULAR | Status: AC
  Filled 2022-10-11: qty 2

## 2022-10-11 MED ORDER — ACETAMINOPHEN 325 MG PO TABS: 650.0000 mg | ORAL_TABLET | Freq: Four times a day (QID) | ORAL | Status: DC | PRN

## 2022-10-11 MED ORDER — CEFAZOLIN SODIUM-DEXTROSE 2-4 GM/100ML-% IV SOLN
INTRAVENOUS | Status: AC
  Filled 2022-10-11: qty 100

## 2022-10-11 MED ORDER — FENTANYL CITRATE (PF) 100 MCG/2ML IJ SOLN
INTRAMUSCULAR | Status: AC | PRN
  Administered 2022-10-11 (×2): 25 ug via INTRAVENOUS
  Administered 2022-10-11: 50 ug via INTRAVENOUS

## 2022-10-11 MED ORDER — SODIUM CHLORIDE 0.9 % IV SOLN: INTRAVENOUS | Status: DC

## 2022-10-11 MED ORDER — BUPIVACAINE HCL (PF) 0.5 % IJ SOLN
INTRAMUSCULAR | Status: AC
  Filled 2022-10-11: qty 30

## 2022-10-11 MED ORDER — MIDAZOLAM HCL 2 MG/2ML IJ SOLN
INTRAMUSCULAR | Status: AC
  Filled 2022-10-11: qty 2

## 2022-10-11 MED ORDER — CEFAZOLIN SODIUM-DEXTROSE 2-4 GM/100ML-% IV SOLN: 2.0000 g | INTRAVENOUS | Status: DC

## 2022-10-11 MED ORDER — ACETAMINOPHEN 325 MG PO TABS
ORAL_TABLET | ORAL | Status: AC
  Administered 2022-10-11: 650 mg via ORAL
  Filled 2022-10-11: qty 2

## 2022-10-11 NOTE — Procedures (Signed)
INTERVENTIONAL NEURORADIOLOGY BRIEF POSTPROCEDURE NOTE  T10 CORE BONE BIOPSY, RADIOFREQUENCY ABLATION AND BALLOON KYPHOPLASTY  Attending: Dr. Pedro Earls  Diagnosis: T10 lytic lesion  Access site: Percutaneous  Anesthesia: Moderate sedation  Medication used: 2 mg Versed IV; 100 mcg Fentanyl IV.  Complications: None.  Estimated blood loss: Negligible  Specimen: T10 core biopsy samples from each side of the vertebral body.  Bilateral transpedicular approach utilized for T10 core bone biopsy followed by radiofrequency ablation and balloon kyphoplasty.   The patient tolerated the procedure well without incident or complication and is in stable condition.

## 2022-10-11 NOTE — Progress Notes (Signed)
Per Dr. Karenann Cai, patient can resume coumadin tonight (or when he normally takes his dose).

## 2022-10-12 ENCOUNTER — Telehealth: Payer: Self-pay | Admitting: Medical Oncology

## 2022-10-12 ENCOUNTER — Ambulatory Visit
Admission: RE | Admit: 2022-10-12 | Discharge: 2022-10-12 | Disposition: A | Discharge status: Home / Self Care | Payer: Medicare Other | Source: Ambulatory Visit | Attending: Radiation Oncology | Admitting: Radiation Oncology

## 2022-10-12 VITALS — BP 128/98 | HR 103 | Temp 96.6°F | Resp 18 | Ht 72.0 in | Wt 193.0 lb

## 2022-10-12 DIAGNOSIS — E785 Hyperlipidemia, unspecified: Secondary | ICD-10-CM | POA: Insufficient documentation

## 2022-10-12 DIAGNOSIS — Z79899 Other long term (current) drug therapy: Secondary | ICD-10-CM | POA: Diagnosis not present

## 2022-10-12 DIAGNOSIS — Z8546 Personal history of malignant neoplasm of prostate: Secondary | ICD-10-CM | POA: Insufficient documentation

## 2022-10-12 DIAGNOSIS — C7951 Secondary malignant neoplasm of bone: Secondary | ICD-10-CM

## 2022-10-12 DIAGNOSIS — C9000 Multiple myeloma not having achieved remission: Secondary | ICD-10-CM | POA: Diagnosis not present

## 2022-10-12 DIAGNOSIS — I10 Essential (primary) hypertension: Secondary | ICD-10-CM | POA: Diagnosis not present

## 2022-10-12 DIAGNOSIS — K449 Diaphragmatic hernia without obstruction or gangrene: Secondary | ICD-10-CM | POA: Insufficient documentation

## 2022-10-12 DIAGNOSIS — C61 Malignant neoplasm of prostate: Secondary | ICD-10-CM

## 2022-10-12 DIAGNOSIS — Z923 Personal history of irradiation: Secondary | ICD-10-CM | POA: Insufficient documentation

## 2022-10-12 DIAGNOSIS — E559 Vitamin D deficiency, unspecified: Secondary | ICD-10-CM | POA: Diagnosis not present

## 2022-10-12 DIAGNOSIS — I2510 Atherosclerotic heart disease of native coronary artery without angina pectoris: Secondary | ICD-10-CM | POA: Diagnosis not present

## 2022-10-12 DIAGNOSIS — R911 Solitary pulmonary nodule: Secondary | ICD-10-CM

## 2022-10-12 NOTE — Progress Notes (Incomplete)
High Risk Bone Metastases > 2 cm Junctional Spine (Occ-C2, C7-T2, T11-L1, L5-S1) Hip or Sacroiliac Joint Extremity Long Bones  In these cases, Prophylactic Radiation Treatment reduces Skeletal Related Events (29% to 1.6%), reduced Hospitalizations (4 to zero) and prolonged overall survival.  Reference: [email or web address]

## 2022-10-12 NOTE — Progress Notes (Signed)
Radiation Oncology         [phone] ________________________________  Initial outpatient Consultation  Name: Ronald Duran MRN: [id number]  Date of Service: 10/12/2022 DOB: March 26, 1941  CC:Ronald Blacker, Ronald Duran  Curt Bears, Ronald Duran   REFERRING PHYSICIAN: Curt Bears, Ronald Duran  DIAGNOSIS: 82 y/o man with multiple myeloma involving T10, the left femoral neck and other sites    ICD-10-CM   1. Lung nodule  R91.1     2. 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 male seen at the request of Dr. Julien Nordmann.  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 and Dr. Valere Dross.  He did have a 45-monthLupron injection prior to the procedure to downsize the prostate.  He had an excellent response to treatment with a PSA nadir at 0.26 in June 2018, with no evidence of recurrence, so he was released from urology follow-up at that time.  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  More recently, he had a CT coronary calcium/angiography scan on 08/29/2022 during workup for refractory arrhythmia despite cardioversion.  That scan showed multiple right lung nodules with a spiculated, solid appearing 9 mm nodule in the RLL as well as a lucent area in the left mid-lower thoracic vertebral body.  This was further evaluated with a PET scan which was performed on 09/19/2022 and confirmed hypermetabolic lytic lesion at [id number] an additional lytic lesion in the left femoral neck but no blastic lesions noted.  The pulmonary nodules were not hypermetabolic but still felt the nodule in the RLL was highly suspicious given its small size which likely follows below PET size criteria for sensitivity.  He had an MRI of the thoracic spine on 09/27/2022 which confirmed a 2.2 cm metastasis in the T10  vertebral body with subcentimeter deposits in T4, T9 and T11, without fracture or extraosseous extension.  PSA obtained on 10/05/2022 remained very low at 0.4.  Dr. IValeta Harmsdiscussed the case with Dr. MEarlie Serverand they came to the decision to proceed with bone biopsy since it was felt that bronchoscopy biopsy would be low yield given the lack of metabolic activity on the PET scan and small size of the lesion.  He underwent bone biopsy with osteo cool RFA and kyphoplasty under the care of Dr. KPedro Earlson 10/11/2022 and pathology remains pending.  He has been kindly [place] of the potential role of radiotherapy in the management of his disease.  PREVIOUS RADIATION THERAPY: Yes  10/15/2007: Definitive prostate brachytherapy of the prostate (Dr. GBradley Ferris MValere Dross  PAST MEDICAL HISTORY:  Past Medical History:  Diagnosis Date   CAD (coronary artery disease)    a. s/p MI with LAD stent placement December, 1999 -last stress test November 2008 with no EKG evidence of ischemia  b. cath 07/23/2015 after abnormal ETT and nuc, occluded mid LAD stent with good distal collateral, medical therapy    Cardiomy[place]    Ischemic cardiomyopathy with EF 40% by echo 2005   Dyslipidemia    Erectile dysfunction    Hiatal hernia    with esophageal strictures and dilatations in the past - Dr. DDelfin Edis  History of nuclear stress test  Myoview 12/16: EF 47%, anterior, anteroseptal and inferoseptal defect suggesting infarct with peri-infarct ischemia, intermediate risk   HTN (hypertension)    Hx of seasonal allergies    Hyperlipidemia    PAD (peripheral artery disease) ([place])    Pre-diabetes    Prostate cancer ([place]    with seed implant therapy following Lupron therapy in 2008 and 2009 - followed by [name]   Ringworm    Vitamin D deficiency       PAST SURGICAL HISTORY: Past Surgical History:  Procedure Laterality Date   CARDIAC CATHETERIZATION N/A 07/23/2015    Procedure: Left Heart Cath and Coronary Angiography;  Surgeon: Belva Crome, Ronald Duran;  Location: [facility];  Service: Cardiovascular;  Laterality: N/A;   CARDIOVERSION N/A 06/03/2022   Procedure: CARDIOVERSION;  Surgeon: Fay Records, Ronald Duran;  Location: [facility];  Service: Cardiovascular;  Laterality: N/A;   COLONOSCOPY WITH PROPOFOL N/A 11/24/2015   Procedure: COLONOSCOPY WITH PROPOFOL;  Surgeon: Garlan Fair, Ronald Duran;  Location: [facility];  Service: Endoscopy;  Laterality: N/A;   esophageal strictures with dilatations in the past per Dr. Delfin Edis     last '98   HERNIA REPAIR Left    IR BONE TUMOR(S)RF ABLATION  10/11/2022   IR KYPHO THORACIC WITH BONE BIOPSY  10/11/2022   LAD stent placement - Dr. Daneen Schick     left inguinal hernia repair - Dr. Magdalene River     prostatic radioactive seed implantation - Dr. Valere Dross     '09    FAMILY HISTORY:  Family History  Problem Relation Age of Onset   CVA Mother    CVA Father 5   CAD Sister     SOCIAL HISTORY:  Social History   Socioeconomic History   Marital status: Married    Spouse name: Not on file   Number of children: Not on file   Years of education: Not on file   Highest education level: Not on file  Occupational History   Not on file  Tobacco Use   Smoking status: Never   Smokeless tobacco: Never  Vaping Use   Vaping Use: Never used  Substance and Sexual Activity   Alcohol use: Yes    Alcohol/week: 0.0 standard drinks of alcohol    Comment: occasionally for special occasions     Drug use: No   Sexual activity: Not on file  Other Topics Concern   Not on file  Social History Narrative   Not on file   Social Determinants of Health   Financial Resource Strain: Not on file  Food Insecurity: No Food Insecurity (10/12/2022)   Hunger Vital Sign    Worried About Running Out of Food in the Last Year: Never true    Ran Out of Food in the Last Year: Never true  Transportation Needs: No Transportation Needs  (10/12/2022)   PRAPARE - Hydrologist (Medical): No    Lack of Transportation (Non-Medical): No  Physical Activity: Not on file  Stress: Not on file  Social Connections: Not on file  Intimate Partner Violence: Not At Risk (10/12/2022)   Humiliation, Afraid, Rape, and Kick questionnaire    Fear of Current or Ex-Partner: No    Emotionally Abused: No    Physically Abused: No    Sexually Abused: No    ALLERGIES: Patient has no known allergies.  MEDICATIONS:  Current Outpatient Medications  Medication Sig Dispense Refill   amLODipine (NORVASC) 5 MG tablet Take 1  tablet (5 mg total) by mouth daily. 90 tablet 3   atorvastatin (LIPITOR) 40 MG tablet Take 40 mg by mouth at bedtime.     B Complex Vitamins (B COMPLEX 100) tablet Take 1 tablet by mouth daily. Balance     benazepril-hydrochlorthiazide (LOTENSIN HCT) 20-12.5 MG tablet Take 1 tablet by mouth daily. 90 tablet 3   cetirizine (ZYRTEC) 10 MG tablet Take 10 mg by mouth daily.     Cholecalciferol (VITAMIN D3) 2000 UNITS TABS Take 2,000 Units by mouth daily.     Coenzyme Q10 (CO Q-10) 100 MG CAPS Take 100 mg by mouth daily with supper.     enoxaparin (LOVENOX) 80 MG/0.8ML injection Inject 0.8 mLs (80 mg total) into the skin every 12 (twelve) hours. 13.6 mL 0   metoprolol succinate (TOPROL XL) 25 MG 24 hr tablet Take 1 tablet (25 mg total) by mouth daily. 90 tablet 3   Multiple Vitamins-Minerals (CENTRUM) tablet Take 1 tablet by mouth daily.     nitroGLYCERIN (NITROSTAT) 0.4 MG SL tablet PLACE 1 TABLET UNDER THE TONGUE IF NEEDED EVERY 5 MINUTES FOR CHEST PAIN FOR 3 DOSES IF NO RELIEF AFTER FIRST DOSE CALL 911. (Patient not taking: Reported on 10/05/2022) 25 tablet 9   omeprazole (PRILOSEC) 20 MG capsule Take 20 mg by mouth every morning.     warfarin (COUMADIN) 5 MG tablet TAKE 1/2 TO 1 TABLET BY MOUTH DAILY AS DIRECTED BY COUMADIN CLINIC (Patient not taking: Reported on 10/05/2022) 100 tablet 0   No current  facility-administered medications for this encounter.    REVIEW OF SYSTEMS:  On review of systems, the patient reports that he is doing well overall. He denies any chest pain, shortness of breath, cough, fevers, chills, night sweats, unintended weight changes. He denies any bowel or bladder disturbances, and denies abdominal pain, nausea or vomiting. He denies any new musculoskeletal or joint aches or pains. A complete review of systems is obtained and is otherwise negative.    PHYSICAL EXAM:  Wt Readings from Last 3 Encounters:  10/12/22 193 lb (87.5 kg)  10/11/22 190 lb (86.2 kg)  10/05/22 193 lb (87.5 kg)   Temp Readings from Last 3 Encounters:  10/12/22 (!) 96.6 F (35.9 C) (Temporal)  10/11/22 98.7 F (37.1 C) (Tympanic)  10/05/22 97.9 F (36.6 C) (Oral)   BP Readings from Last 3 Encounters:  10/12/22 (!) 128/98  10/11/22 114/87  10/05/22 (!) 141/96   Pulse Readings from Last 3 Encounters:  10/12/22 (!) 103  10/11/22 74  10/05/22 85   Pain Assessment Pain Score: 0-No pain/10  In general this is a well appearing man in no acute distress. He is alert and oriented x4 and appropriate throughout the examination. HEENT reveals that the patient is normocephalic, atraumatic. EOMs are intact. PERRLA. Skin is intact without any evidence of gross lesions. Cardiovascular exam reveals a regular rate and rhythm, no clicks rubs or murmurs are auscultated. Chest is clear to auscultation bilaterally. Lymphatic assessment is performed and does not reveal any adenopathy in the cervical, supraclavicular, axillary, or inguinal chains. Abdomen has active bowel sounds in all quadrants and is intact. The abdomen is soft, non tender, non distended. Lower extremities are negative for pretibial pitting edema, deep calf tenderness, cyanosis or clubbing.   KPS = 100  100 - Normal; no complaints; no evidence of disease. 90   - Able to carry on normal activity; minor signs or symptoms of disease. 80    - Normal activity with  effort; some signs or symptoms of disease. 1   - Cares for self; unable to carry on normal activity or to do active work. 60   - Requires occasional assistance, but is able to care for most of his personal needs. 50   - Requires considerable assistance and frequent medical care. 48   - Disabled; requires special care and assistance. 78   - Severely disabled; hospital admission is indicated although death not imminent. 53   - Very sick; hospital admission necessary; active supportive treatment necessary. 10   - Moribund; fatal processes progressing rapidly. 0     - Dead  Karnofsky DA, Abelmann [place], Craver LS and Burchenal JH [phone]) The use of the nitrogen mustards in the palliative treatment of carcinoma: with particular reference to bronchogenic carcinoma Cancer 1 634-56  LABORATORY DATA:  Lab Results  Component Value Date   WBC 9.6 10/11/2022   HGB 16.9 10/11/2022   HCT 48.7 10/11/2022   MCV 98.0 10/11/2022   PLT 156 10/11/2022   Lab Results  Component Value Date   NA 139 10/11/2022   K 3.4 (L) 10/11/2022   CL 102 10/11/2022   CO2 25 10/11/2022   Lab Results  Component Value Date   ALT 31 10/05/2022   AST 25 10/05/2022   ALKPHOS 69 10/05/2022   BILITOT 1.7 (H) 10/05/2022     RADIOGRAPHY: IR Bone Tumor(s)RF Ablation  Result Date: 10/11/2022 INDICATION: 82 year old male with hypermetabolic [id number] vertebral body lytic lesion concerning for metastatic disease. Multiple pulmonary nodules and left femoral neck hypermetabolic lesion are also identified on prior studies. He comes today for a core bone biopsy followed by radiofrequency ablation and kyphoplasty. EXAM: T10 VERTEBRAL BODY CORE BIOPSY, RADIOFREQUENCY ABLATION AND BALLOON KYPHOPLASTY COMPARISON:  MRI of the thoracic spine September 27, 2022. MEDICATIONS: Ancef 2 g IV; The antibiotic was administered in an appropriate time interval prior to needle puncture of the skin. ANESTHESIA/SEDATION: Total  intra-service Moderate Sedation Time: 54 minute. A total of 2 mg of Versed and 100 mcg of fentanyl were administered intravenously by the radiology nurse low assisted in monitoring the patients level of consciousness and vital signs continuously throughout the procedure under my direct supervision. FLUOROSCOPY: Radiation Exposure Index (as provided by the fluoroscopic device): XX123456 mGy Kerma COMPLICATIONS: None immediate. TECHNIQUE: Informed written consent was obtained from the patient after a thorough discussion of the procedural risks, benefits and alternatives. All questions were addressed. Maximal Sterile Barrier Technique was utilized including caps, mask, sterile gowns, sterile gloves, sterile drape, hand hygiene and skin antiseptic. A timeout was performed prior to the initiation of the procedure. The patient was placed in prone position on the angiography table. The thoracic region was prepped and draped in a sterile fashion. Under fluoroscopy, the T10 vertebral body was delineated and the skin area was marked. The skin was infiltrated with a 1% Lidocaine approximately 3.5 cm lateral to the spinous process projection on the right. Using a 22-gauge spinal needle, the soft issue and the peripedicular space and periosteum were infiltrated with Bupivacaine 0.5%. A skin incision was made at the access site. Subsequently, an 11-gauge Kyphon trocar was inserted under fluoroscopic guidance until contact with the pedicle was obtained. The trocar was inserted into the pedicle with light hammer tapping until the posterior boundaries of the vertebral body was reached. The diamond mandrill was removed and one core biopsy was obtained. The skin was infiltrated with a 1% Lidocaine approximately 3 cm lateral to the spinous process projection on  the left. Using a 22-gauge spinal needle, the soft issue and the peripedicular space and periosteum were infiltrated with Bupivacaine 0.5%. A skin incision was made at the access  site. Subsequently, an 11-gauge Kyphon trocar was inserted under fluoroscopic guidance until contact with the pedicle was obtained. The trocar was inserted with light hammer tapping into the pedicle until the posterior boundaries of the vertebral body was reached. The diamond mandrill was removed and one core biopsy was obtained. A bone drill was coaxially advanced within the anterior third of the vertebral body on each side for proper RF probes size selection. An OsteoCool RF probe was inserted bilaterally within the mid-posterior third of the vertebral body. The radiofrequency ablation cycle was performed. Thereafter, the RF probes were exchanged for inflatable Kyphon balloons, which were centered within the mid-aspect of the vertebral body. The balloons were inflated to create a void to serve as a repository for the bone cement. Both balloons were deflated and through both cannulas, under continuous fluoroscopy guidance in the AP and lateral views, the vertebral body was filled with previously mixed polymethyl-methacrylate (PMMA) added to barium for opacification. Both cannulas were later removed. FINDINGS: Postprocedural radiographic images showed cement distribution within the anterior 2/3 of the vertebral body. IMPRESSION: Successful and uncomplicated [id number] vertebral body core bone biopsy followed by radiofrequency ablation and balloon kyphoplasty for treatment of hypermetabolic lytic lesion. Core biopsy samples were sent to the laboratory for tissue exam. Electronically Signed   By: Pedro Earls M.D.   On: 10/11/2022 14:33   IR KYPHO THORACIC WITH BONE BIOPSY  Result Date: 10/11/2022 INDICATION: 82 year old male with hypermetabolic [id number] vertebral body lytic lesion concerning for metastatic disease. Multiple pulmonary nodules and left femoral neck hypermetabolic lesion are also identified on prior studies. He comes today for a core bone biopsy followed by radiofrequency ablation and  kyphoplasty. EXAM: T10 VERTEBRAL BODY CORE BIOPSY, RADIOFREQUENCY ABLATION AND BALLOON KYPHOPLASTY COMPARISON:  MRI of the thoracic spine September 27, 2022. MEDICATIONS: Ancef 2 g IV; The antibiotic was administered in an appropriate time interval prior to needle puncture of the skin. ANESTHESIA/SEDATION: Total intra-service Moderate Sedation Time: 54 minute. A total of 2 mg of Versed and 100 mcg of fentanyl were administered intravenously by the radiology nurse low assisted in monitoring the patients level of consciousness and vital signs continuously throughout the procedure under my direct supervision. FLUOROSCOPY: Radiation Exposure Index (as provided by the fluoroscopic device): XX123456 mGy Kerma COMPLICATIONS: None immediate. TECHNIQUE: Informed written consent was obtained from the patient after a thorough discussion of the procedural risks, benefits and alternatives. All questions were addressed. Maximal Sterile Barrier Technique was utilized including caps, mask, sterile gowns, sterile gloves, sterile drape, hand hygiene and skin antiseptic. A timeout was performed prior to the initiation of the procedure. The patient was placed in prone position on the angiography table. The thoracic region was prepped and draped in a sterile fashion. Under fluoroscopy, the T10 vertebral body was delineated and the skin area was marked. The skin was infiltrated with a 1% Lidocaine approximately 3.5 cm lateral to the spinous process projection on the right. Using a 22-gauge spinal needle, the soft issue and the peripedicular space and periosteum were infiltrated with Bupivacaine 0.5%. A skin incision was made at the access site. Subsequently, an 11-gauge Kyphon trocar was inserted under fluoroscopic guidance until contact with the pedicle was obtained. The trocar was inserted into the pedicle with light hammer tapping until the posterior boundaries of the vertebral body  was reached. The diamond mandrill was removed and one  core biopsy was obtained. The skin was infiltrated with a 1% Lidocaine approximately 3 cm lateral to the spinous process projection on the left. Using a 22-gauge spinal needle, the soft issue and the peripedicular space and periosteum were infiltrated with Bupivacaine 0.5%. A skin incision was made at the access site. Subsequently, an 11-gauge Kyphon trocar was inserted under fluoroscopic guidance until contact with the pedicle was obtained. The trocar was inserted with light hammer tapping into the pedicle until the posterior boundaries of the vertebral body was reached. The diamond mandrill was removed and one core biopsy was obtained. A bone drill was coaxially advanced within the anterior third of the vertebral body on each side for proper RF probes size selection. An OsteoCool RF probe was inserted bilaterally within the mid-posterior third of the vertebral body. The radiofrequency ablation cycle was performed. Thereafter, the RF probes were exchanged for inflatable Kyphon balloons, which were centered within the mid-aspect of the vertebral body. The balloons were inflated to create a void to serve as a repository for the bone cement. Both balloons were deflated and through both cannulas, under continuous fluoroscopy guidance in the AP and lateral views, the vertebral body was filled with previously mixed polymethyl-methacrylate (PMMA) added to barium for opacification. Both cannulas were later removed. FINDINGS: Postprocedural radiographic images showed cement distribution within the anterior 2/3 of the vertebral body. IMPRESSION: Successful and uncomplicated [id number] vertebral body core bone biopsy followed by radiofrequency ablation and balloon kyphoplasty for treatment of hypermetabolic lytic lesion. Core biopsy samples were sent to the laboratory for tissue exam. Electronically Signed   By: Pedro Earls M.D.   On: 10/11/2022 14:33   MR THORACIC SPINE W WO CONTRAST  Result Date:  09/28/2022 CLINICAL DATA:  Better Ulyess Mort prostate cancer with abnormal PET CT and/. Your evaluate T10 talking EXAM: MRI THORACIC WITHOUT AND WITH CONTRAST TECHNIQUE: Multiplanar and multiecho pulse sequences of the thoracic spine were obtained without and with intravenous contrast. CONTRAST:  57m GADAVIST GADOBUTROL 1 MMOL/ML IV SOLN COMPARISON:  None Available. FINDINGS: Alignment:  Normal Vertebrae: 2.2 cm lesion in the vertebral body of T10. Subcentimeter lesions also seen in the T4 spinous process, T9 posterior body, and T11 left para median body. No fracture or extraosseous tumor extension. Cord:  No evidence of intrathecal metastasis. Paraspinal and other soft tissues: Negative for paravertebral mass or inflammation. Disc levels: No significant degenerative change.  No neural impingement. 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 Additional subcentimeter deposits in the T4 spinous process and T9, T11 vertebral bodies. No fracture or soft tissue extension. Electronically Signed   By: JJorje GuildM.D.   On: 09/28/2022 20:44   NM PET Image Initial (PI) Skull Base To Thigh (F-18 FDG)  Result Date: 09/20/2022 CLINICAL DATA:  Initial treatment strategy for pulmonary nodules. History of prostate cancer. EXAM: NUCLEAR MEDICINE PET SKULL BASE TO THIGH TECHNIQUE: 9.76 mCi F-18 FDG was injected intravenously. Full-ring PET imaging was performed from the skull base to thigh after the radiotracer. CT data was obtained and used for attenuation correction and anatomic localization. Fasting blood glucose: 104 mg/dl COMPARISON:  Chest CT 08/31/2022, cardiac CT 08/29/2022 and abdominal CT 10/20/2009. FINDINGS: Mediastinal blood pool activity: SUV max 1.7 NECK: No hypermetabolic cervical lymph nodes are identified.Physiologic activity within the muscles of phonation.No suspicious activity identified within the pharyngeal mucosal space. Incidental CT findings: none CHEST: There are no hypermetabolic mediastinal, hilar  or  axillary lymph nodes. No hypermetabolic pulmonary activity. Specifically, there is no hypermetabolic activity seen within the previously demonstrated small pulmonary nodules which measure up to 8 mm in the right lower lobe on image 48/7. Most of these nodules are too small to optimally evaluate by PET-CT. Incidental CT findings: Atherosclerosis of the aorta, great vessels and coronary arteries. There are calcifications at the left ventricular apex. ABDOMEN/PELVIS: There is no hypermetabolic activity within the liver, adrenal glands, spleen or pancreas. There is no hypermetabolic nodal activity in the abdomen or pelvis. No hypermetabolic activity associated with the prostate gland. Incidental CT findings: Stable probable small cyst in the left hepatic lobe, without hypermetabolic activity and present on remote abdominal CT. Mild aortic and branch vessel atherosclerosis. Prostate brachytherapy seeds are noted. SKELETON: Previously demonstrated lytic lesion within the left aspect of the T10 vertebral body is intensely hypermetabolic with an SUV max of 16.9. This lesion measures 1.7 x 1.2 cm on image 56/7 and is new compared with the remote abdominal CT. There is an additional hypermetabolic lytic lesion in the left femoral neck which measures approximately 2.9 cm on image 204/4 and has an SUV max of 6.5. This lesion also appears new from the remote abdominal CT. No other hypermetabolic lesions are identified within the axial or proximal appendicular skeleton. There are no blastic lesions or pathologic fractures. Incidental CT findings: Mild spondylosis. IMPRESSION: 1. The recently demonstrated pulmonary nodules do not demonstrate any hypermetabolic activity, although are suboptimally evaluated due to size. Based on the morphology of the right lower lobe lesion and the accompanying osseous findings, metastatic disease is not excluded. Recommend CT follow-up in 3-6 months. 2. Hypermetabolic lytic lesions in the T10  vertebral body and left femoral neck concerning for metastatic disease or multiple myeloma. These lesions are not typical for metastatic prostate cancer. Based on location, the left femoral neck lesion is at risk for pathologic fracture. Recommend MRI of the thoracic spine without and with contrast for further evaluation. Tissue sampling likely warranted. 3. No other findings suspicious for metastatic disease. 4. Coronary and Aortic Atherosclerosis (ICD10-I70.0). Electronically Signed   By: Richardean Sale M.D.   On: 09/20/2022 09:10      IMPRESSION/PLAN: 1. 82 y/o man with multiple myeloma involving T10, the left femoral neck and other sites  Today, we talked to the patient and his wife, Enid Derry, about the findings and workup thus far. We discussed the natural history of metastatic carcinoma and general treatment, highlighting the role of radiotherapy in the management.  We are awaiting final pathology prior to finalizing the treatment recommendation but we did discuss the potential role of radiotherapy in the management of high risk bone metastases* (see reference below) and the available radiation techniques, focusing on the details and logistics of delivery.  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 We reviewed the anticipated acute and late sequelae associated with radiation in this setting. The patient was encouraged to ask questions that were answered to his stated satisfaction.  He is scheduled for a consult visit with orthopedic surgeon, Dr. Marlou Sa, on 10/19/2022 to discuss the potential need for surgical stabilization of the left femoral neck lesion prior to radiation and he understands that if this is the case, we would plan to postpone the start of radiation for at least 3 weeks postoperatively to allow for surgical healing.  We will share our discussion with Dr.  Mohammed and plan to  follow-up with the patient after his consult visit with Dr. Marlou Sa, next week, to formalize the treatment plan.  Hopefully, we will have the final pathology report by then to help further inform our treatment recommendation.  He appears to have a good understanding of his disease and our current recommendations and is comfortable and in agreement with the stated plan.  We enjoyed meeting him and his wife today and look forward to continuing to participate in his care.  We personally spent 70 minutes in this encounter including chart review, reviewing radiological studies, meeting face-to-face with the patient, entering orders and completing documentation.    Ronald Johns, Ronald Duran    Ronald Pita, Ronald Duran  [facility] Direct Dial: [phone]  Fax: [phone] [email or web address]  Skype  [email or web address]   *High Risk Bone Metastases*  Here are the criteria for high risk bone mets in patients with metastatic solid tumor malignancy and more than five metastatic lesions, including at least one asymptomatic high-risk bone lesion.   > 2 cm Junctional Spine (Occ-C2, C7-T2, T11-L1, L5-S1) Hip or Sacroiliac Joint Extremity Long Bones  In these cases, Radiation Treatment has been proven to reduce Skeletal Related Events (29% to 1.6%), reduce Hospitalizations (4 to zero) and prolong overall survival.  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

## 2022-10-12 NOTE — Telephone Encounter (Signed)
appt conflict on [id number]

## 2022-10-13 LAB — PROTEIN ELECTROPHORESIS, SERUM, WITH REFLEX
A/G Ratio: 1 (ref 0.7–1.7)
Albumin ELP: 3.7 g/dL (ref 2.9–4.4)
Alpha-1-Globulin: 0.2 g/dL (ref 0.0–0.4)
Alpha-2-Globulin: 0.6 g/dL (ref 0.4–1.0)
Beta Globulin: 0.7 g/dL (ref 0.7–1.3)
Gamma Globulin: 2.2 g/dL — ABNORMAL HIGH (ref 0.4–1.8)
Globulin, Total: 3.7 g/dL (ref 2.2–3.9)
M-Spike, %: 2 g/dL — ABNORMAL HIGH
SPEP Interpretation: 0
Total Protein ELP: 7.4 g/dL (ref 6.0–8.5)

## 2022-10-13 LAB — IMMUNOFIXATION REFLEX, SERUM
IgA: 127 mg/dL (ref 61–437)
IgG (Immunoglobin G), Serum: 3624 mg/dL — ABNORMAL HIGH (ref 603–1613)
IgM (Immunoglobulin M), Srm: 83 mg/dL (ref 15–143)

## 2022-10-14 LAB — SURGICAL PATHOLOGY

## 2022-10-17 ENCOUNTER — Encounter: Payer: Self-pay | Admitting: Internal Medicine

## 2022-10-17 ENCOUNTER — Ambulatory Visit: Payer: Medicare Other | Attending: Internal Medicine | Admitting: *Deleted

## 2022-10-17 DIAGNOSIS — Z79899 Other long term (current) drug therapy: Secondary | ICD-10-CM | POA: Diagnosis not present

## 2022-10-17 DIAGNOSIS — I6349 Cerebral infarction due to embolism of other cerebral artery: Secondary | ICD-10-CM | POA: Diagnosis not present

## 2022-10-17 DIAGNOSIS — I4819 Other persistent atrial fibrillation: Secondary | ICD-10-CM

## 2022-10-17 LAB — POCT INR: POC INR: 2.2

## 2022-10-17 NOTE — Patient Instructions (Addendum)
Description   -Stop lovenox -Continue to take warfarin 1 tablet daily except for 1/2 a tablet on Monday, Wednesday and Friday. Continue eating broccoli only every 2-3 days per week. Recheck INR 4 weeks. [facility] [phone]. Please let us know if you have any med changes or upcoming procedures.

## 2022-10-19 ENCOUNTER — Telehealth: Payer: Self-pay | Admitting: Pharmacist

## 2022-10-19 ENCOUNTER — Encounter: Payer: Self-pay | Admitting: Orthopedic Surgery

## 2022-10-19 ENCOUNTER — Ambulatory Visit (INDEPENDENT_AMBULATORY_CARE_PROVIDER_SITE_OTHER): Payer: Medicare Other | Admitting: Orthopedic Surgery

## 2022-10-19 ENCOUNTER — Ambulatory Visit (INDEPENDENT_AMBULATORY_CARE_PROVIDER_SITE_OTHER): Payer: Medicare Other

## 2022-10-19 ENCOUNTER — Ambulatory Visit: Payer: Self-pay

## 2022-10-19 ENCOUNTER — Inpatient Hospital Stay: Payer: Medicare Other

## 2022-10-19 ENCOUNTER — Inpatient Hospital Stay: Payer: Medicare Other | Attending: Internal Medicine | Admitting: Internal Medicine

## 2022-10-19 VITALS — BP 137/97 | HR 95 | Temp 97.6°F | Resp 18 | Wt 191.4 lb

## 2022-10-19 DIAGNOSIS — C801 Malignant (primary) neoplasm, unspecified: Secondary | ICD-10-CM | POA: Diagnosis not present

## 2022-10-19 DIAGNOSIS — C9000 Multiple myeloma not having achieved remission: Secondary | ICD-10-CM

## 2022-10-19 DIAGNOSIS — R768 Other specified abnormal immunological findings in serum: Secondary | ICD-10-CM | POA: Diagnosis not present

## 2022-10-19 DIAGNOSIS — C9030 Solitary plasmacytoma not having achieved remission: Secondary | ICD-10-CM | POA: Insufficient documentation

## 2022-10-19 DIAGNOSIS — C7951 Secondary malignant neoplasm of bone: Secondary | ICD-10-CM

## 2022-10-19 DIAGNOSIS — R918 Other nonspecific abnormal finding of lung field: Secondary | ICD-10-CM | POA: Insufficient documentation

## 2022-10-19 DIAGNOSIS — I4891 Unspecified atrial fibrillation: Secondary | ICD-10-CM

## 2022-10-19 MED ORDER — ENOXAPARIN SODIUM 80 MG/0.8ML IJ SOSY: 80.0000 mg | PREFILLED_SYRINGE | Freq: Two times a day (BID) | INTRAMUSCULAR | 0 refills | Status: DC

## 2022-10-19 NOTE — Progress Notes (Signed)
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 male returns to the clinic today for follow-up visit accompanied by his wife.  The patient is feeling fine today with no concerning complaints except for mild fatigue.  He underwent T10 vertebral body core biopsy as well as radiofrequency ablation and balloon kyphoplasty by interventional radiology on October 11, 2022.  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 The lesion consists of sheets of MUM1/CD138 positive plasma cells that are kappa restricted by kappa/lambda ISH.  They aberrantly express CD117 weakly, CD56 and are negative for cyclin D1.  The above findings are consistent with a plasmacytoma if isolated.  Clinical/radiologic correlation necessary to fully exclude a plasma cell myeloma.  The patient was also referred to Dr. Marlou Sa for evaluation of the left femoral neck lytic lesion.  He was also seen by Dr. Tammi Klippel for discussion of radiotherapy.  He also has an appointment with Dr. Valeta Harms for consideration of bronchoscopy and biopsy of the right lower lobe lung lesion.  He denied having any current chest pain, shortness of breath, cough or hemoptysis.  He has no nausea, vomiting, diarrhea or constipation.  He has no headache or visual changes.  He is here for evaluation and recommendation regarding his condition with the new findings.  He corrected part of his social history that he has 10 grandchildren and he does not drink alcohol anymore.  MEDICAL HISTORY: Past Medical History:  Diagnosis Date   CAD (coronary artery disease)    a. s/p MI with LAD stent placement December,  1999 -last stress test November 2008 with no EKG evidence of ischemia  b. cath 07/23/2015 after abnormal ETT and nuc, occluded mid LAD stent with good distal collateral, medical therapy    Cardiomyopathy ([place])    Ischemic cardiomyopathy with EF 40% by echo 2005   Dyslipidemia    Erectile dysfunction    Hiatal hernia    with esophageal strictures and dilatations in the past - Dr. Delfin Edis   History of nuclear stress test    Myoview 12/16: EF 47%, anterior, anteroseptal and inferoseptal defect suggesting infarct with peri-infarct ischemia, intermediate risk   HTN (hypertension)    Hx of seasonal allergies    Hyperlipidemia    PAD (peripheral artery disease) ([place])    Pre-diabetes    Prostate cancer ([place])    with seed implant therapy following Lupron therapy in 2008 and 2009 - followed by [name]   Ringworm    Vitamin D deficiency     ALLERGIES:  has No Known Allergies.  MEDICATIONS:  Current Outpatient Medications  Medication Sig Dispense Refill   amLODipine (NORVASC) 5 MG tablet Take 1 tablet (5 mg total) by mouth daily. 90 tablet 3   atorvastatin (LIPITOR) 40 MG tablet Take 40 mg by mouth at bedtime.     B Complex Vitamins (B COMPLEX 100) tablet Take 1 tablet by mouth daily. Balance     benazepril-hydrochlorthiazide (LOTENSIN HCT) 20-12.5 MG tablet Take 1 tablet by mouth daily. 90 tablet 3   cetirizine (ZYRTEC) 10 MG tablet Take 10 mg by mouth daily.     Cholecalciferol (VITAMIN D3) 2000  UNITS TABS Take 2,000 Units by mouth daily.     Coenzyme Q10 (CO Q-10) 100 MG CAPS Take 100 mg by mouth daily with supper.     enoxaparin (LOVENOX) 80 MG/0.8ML injection Inject 0.8 mLs (80 mg total) into the skin every 12 (twelve) hours. 13.6 mL 0   metoprolol succinate (TOPROL XL) 25 MG 24 hr tablet Take 1 tablet (25 mg total) by mouth daily. 90 tablet 3   Multiple Vitamins-Minerals (CENTRUM) tablet Take 1 tablet by mouth daily.     nitroGLYCERIN (NITROSTAT) 0.4 MG SL tablet PLACE 1 TABLET  UNDER THE TONGUE IF NEEDED EVERY 5 MINUTES FOR CHEST PAIN FOR 3 DOSES IF NO RELIEF AFTER FIRST DOSE CALL 911. (Patient not taking: Reported on 10/05/2022) 25 tablet 9   omeprazole (PRILOSEC) 20 MG capsule Take 20 mg by mouth every morning.     warfarin (COUMADIN) 5 MG tablet TAKE 1/2 TO 1 TABLET BY MOUTH DAILY AS DIRECTED BY COUMADIN CLINIC (Patient not taking: Reported on 10/05/2022) 100 tablet 0   No current facility-administered medications for this visit.    SURGICAL HISTORY:  Past Surgical History:  Procedure Laterality Date   CARDIAC CATHETERIZATION N/A 07/23/2015   Procedure: Left Heart Cath and Coronary Angiography;  Surgeon: Belva Crome, MD;  Location: [facility];  Service: Cardiovascular;  Laterality: N/A;   CARDIOVERSION N/A 06/03/2022   Procedure: CARDIOVERSION;  Surgeon: Fay Records, MD;  Location: [facility];  Service: Cardiovascular;  Laterality: N/A;   COLONOSCOPY WITH PROPOFOL N/A 11/24/2015   Procedure: COLONOSCOPY WITH PROPOFOL;  Surgeon: Garlan Fair, MD;  Location: [facility];  Service: Endoscopy;  Laterality: N/A;   esophageal strictures with dilatations in the past per Dr. Delfin Edis     last '98   HERNIA REPAIR Left    IR BONE TUMOR(S)RF ABLATION  10/11/2022   IR KYPHO THORACIC WITH BONE BIOPSY  10/11/2022   LAD stent placement - Dr. Daneen Schick     left inguinal hernia repair - Dr. Magdalene River     prostatic radioactive seed implantation - Dr. Valere Dross     '09    REVIEW OF SYSTEMS:  Constitutional: positive for fatigue Eyes: negative Ears, nose, mouth, throat, and face: negative Respiratory: negative Cardiovascular: negative Gastrointestinal: negative Genitourinary:negative Integument/breast: negative Hematologic/lymphatic: negative Musculoskeletal:negative Neurological: negative Behavioral/Psych: negative Endocrine: negative Allergic/Immunologic: negative   PHYSICAL EXAMINATION: General appearance: alert, cooperative, and no  distress Head: Normocephalic, without obvious abnormality, atraumatic Neck: no adenopathy, no JVD, supple, symmetrical, trachea midline, and thyroid not enlarged, symmetric, no tenderness/mass/nodules Lymph nodes: Cervical, supraclavicular, and axillary nodes normal. Resp: clear to auscultation bilaterally Back: symmetric, no curvature. ROM normal. No CVA tenderness. Cardio: regular rate and rhythm, S1, S2 normal, no murmur, click, rub or gallop GI: soft, non-tender; bowel sounds normal; no masses,  no organomegaly Extremities: extremities normal, atraumatic, no cyanosis or edema Neurologic: Alert and oriented X 3, normal strength and tone. Normal symmetric reflexes. Normal coordination and gait  ECOG PERFORMANCE STATUS: 1 - Symptomatic but completely ambulatory  Blood pressure (!) 137/97, pulse 95, temperature 97.6 F (36.4 C), temperature source Oral, resp. rate 18, weight 191 lb 6 oz (86.8 kg), SpO2 97 %.  LABORATORY DATA: Lab Results  Component Value Date   WBC 9.6 10/11/2022   HGB 16.9 10/11/2022   HCT 48.7 10/11/2022   MCV 98.0 10/11/2022   PLT 156 10/11/2022      Chemistry      Component Value Date/Time  NA 139 10/11/2022 0801   NA 141 08/25/2022 1440   K 3.4 (L) 10/11/2022 0801   CL 102 10/11/2022 0801   CO2 25 10/11/2022 0801   BUN 18 10/11/2022 0801   BUN 14 08/25/2022 1440   CREATININE 1.07 10/11/2022 0801   CREATININE 1.07 10/05/2022 1112   CREATININE 1.23 (H) 07/21/2015 0917      Component Value Date/Time   CALCIUM 9.2 10/11/2022 0801   ALKPHOS 69 10/05/2022 1112   AST 25 10/05/2022 1112   ALT 31 10/05/2022 1112   BILITOT 1.7 (H) 10/05/2022 1112       RADIOGRAPHIC STUDIES: IR Bone Tumor(s)RF Ablation  Result Date: 10/11/2022 INDICATION: 82 year old male with hypermetabolic [id number] vertebral body lytic lesion concerning for metastatic disease. Multiple pulmonary nodules and left femoral neck hypermetabolic lesion are also identified on prior studies. He  comes today for a core bone biopsy followed by radiofrequency ablation and kyphoplasty. EXAM: T10 VERTEBRAL BODY CORE BIOPSY, RADIOFREQUENCY ABLATION AND BALLOON KYPHOPLASTY COMPARISON:  MRI of the thoracic spine September 27, 2022. MEDICATIONS: Ancef 2 g IV; The antibiotic was administered in an appropriate time interval prior to needle puncture of the skin. ANESTHESIA/SEDATION: Total intra-service Moderate Sedation Time: 54 minute. A total of 2 mg of Versed and 100 mcg of fentanyl were administered intravenously by the radiology nurse low assisted in monitoring the patients level of consciousness and vital signs continuously throughout the procedure under my direct supervision. FLUOROSCOPY: Radiation Exposure Index (as provided by the fluoroscopic device): XX123456 mGy Kerma COMPLICATIONS: None immediate. TECHNIQUE: Informed written consent was obtained from the patient after a thorough discussion of the procedural risks, benefits and alternatives. All questions were addressed. Maximal Sterile Barrier Technique was utilized including caps, mask, sterile gowns, sterile gloves, sterile drape, hand hygiene and skin antiseptic. A timeout was performed prior to the initiation of the procedure. The patient was placed in prone position on the angiography table. The thoracic region was prepped and draped in a sterile fashion. Under fluoroscopy, the T10 vertebral body was delineated and the skin area was marked. The skin was infiltrated with a 1% Lidocaine approximately 3.5 cm lateral to the spinous process projection on the right. Using a 22-gauge spinal needle, the soft issue and the peripedicular space and periosteum were infiltrated with Bupivacaine 0.5%. A skin incision was made at the access site. Subsequently, an 11-gauge Kyphon trocar was inserted under fluoroscopic guidance until contact with the pedicle was obtained. The trocar was inserted into the pedicle with light hammer tapping until the posterior boundaries of  the vertebral body was reached. The diamond mandrill was removed and one core biopsy was obtained. The skin was infiltrated with a 1% Lidocaine approximately 3 cm lateral to the spinous process projection on the left. Using a 22-gauge spinal needle, the soft issue and the peripedicular space and periosteum were infiltrated with Bupivacaine 0.5%. A skin incision was made at the access site. Subsequently, an 11-gauge Kyphon trocar was inserted under fluoroscopic guidance until contact with the pedicle was obtained. The trocar was inserted with light hammer tapping into the pedicle until the posterior boundaries of the vertebral body was reached. The diamond mandrill was removed and one core biopsy was obtained. A bone drill was coaxially advanced within the anterior third of the vertebral body on each side for proper RF probes size selection. An OsteoCool RF probe was inserted bilaterally within the mid-posterior third of the vertebral body. The radiofrequency ablation cycle was performed. Thereafter, the RF probes were exchanged  for inflatable Kyphon balloons, which were centered within the mid-aspect of the vertebral body. The balloons were inflated to create a void to serve as a repository for the bone cement. Both balloons were deflated and through both cannulas, under continuous fluoroscopy guidance in the AP and lateral views, the vertebral body was filled with previously mixed polymethyl-methacrylate (PMMA) added to barium for opacification. Both cannulas were later removed. FINDINGS: Postprocedural radiographic images showed cement distribution within the anterior 2/3 of the vertebral body. IMPRESSION: Successful and uncomplicated [id number] vertebral body core bone biopsy followed by radiofrequency ablation and balloon kyphoplasty for treatment of hypermetabolic lytic lesion. Core biopsy samples were sent to the laboratory for tissue exam. Electronically Signed   By: Pedro Earls M.D.   On:  10/11/2022 14:33   IR KYPHO THORACIC WITH BONE BIOPSY  Result Date: 10/11/2022 INDICATION: 82 year old male with hypermetabolic [id number] vertebral body lytic lesion concerning for metastatic disease. Multiple pulmonary nodules and left femoral neck hypermetabolic lesion are also identified on prior studies. He comes today for a core bone biopsy followed by radiofrequency ablation and kyphoplasty. EXAM: T10 VERTEBRAL BODY CORE BIOPSY, RADIOFREQUENCY ABLATION AND BALLOON KYPHOPLASTY COMPARISON:  MRI of the thoracic spine September 27, 2022. MEDICATIONS: Ancef 2 g IV; The antibiotic was administered in an appropriate time interval prior to needle puncture of the skin. ANESTHESIA/SEDATION: Total intra-service Moderate Sedation Time: 54 minute. A total of 2 mg of Versed and 100 mcg of fentanyl were administered intravenously by the radiology nurse low assisted in monitoring the patients level of consciousness and vital signs continuously throughout the procedure under my direct supervision. FLUOROSCOPY: Radiation Exposure Index (as provided by the fluoroscopic device): XX123456 mGy Kerma COMPLICATIONS: None immediate. TECHNIQUE: Informed written consent was obtained from the patient after a thorough discussion of the procedural risks, benefits and alternatives. All questions were addressed. Maximal Sterile Barrier Technique was utilized including caps, mask, sterile gowns, sterile gloves, sterile drape, hand hygiene and skin antiseptic. A timeout was performed prior to the initiation of the procedure. The patient was placed in prone position on the angiography table. The thoracic region was prepped and draped in a sterile fashion. Under fluoroscopy, the T10 vertebral body was delineated and the skin area was marked. The skin was infiltrated with a 1% Lidocaine approximately 3.5 cm lateral to the spinous process projection on the right. Using a 22-gauge spinal needle, the soft issue and the peripedicular space and periosteum  were infiltrated with Bupivacaine 0.5%. A skin incision was made at the access site. Subsequently, an 11-gauge Kyphon trocar was inserted under fluoroscopic guidance until contact with the pedicle was obtained. The trocar was inserted into the pedicle with light hammer tapping until the posterior boundaries of the vertebral body was reached. The diamond mandrill was removed and one core biopsy was obtained. The skin was infiltrated with a 1% Lidocaine approximately 3 cm lateral to the spinous process projection on the left. Using a 22-gauge spinal needle, the soft issue and the peripedicular space and periosteum were infiltrated with Bupivacaine 0.5%. A skin incision was made at the access site. Subsequently, an 11-gauge Kyphon trocar was inserted under fluoroscopic guidance until contact with the pedicle was obtained. The trocar was inserted with light hammer tapping into the pedicle until the posterior boundaries of the vertebral body was reached. The diamond mandrill was removed and one core biopsy was obtained. A bone drill was coaxially advanced within the anterior third of the vertebral body on each side for proper  RF probes size selection. An OsteoCool RF probe was inserted bilaterally within the mid-posterior third of the vertebral body. The radiofrequency ablation cycle was performed. Thereafter, the RF probes were exchanged for inflatable Kyphon balloons, which were centered within the mid-aspect of the vertebral body. The balloons were inflated to create a void to serve as a repository for the bone cement. Both balloons were deflated and through both cannulas, under continuous fluoroscopy guidance in the AP and lateral views, the vertebral body was filled with previously mixed polymethyl-methacrylate (PMMA) added to barium for opacification. Both cannulas were later removed. FINDINGS: Postprocedural radiographic images showed cement distribution within the anterior 2/3 of the vertebral body. IMPRESSION:  Successful and uncomplicated [id number] vertebral body core bone biopsy followed by radiofrequency ablation and balloon kyphoplasty for treatment of hypermetabolic lytic lesion. Core biopsy samples were sent to the laboratory for tissue exam. Electronically Signed   By: Pedro Earls M.D.   On: 10/11/2022 14:33   MR THORACIC SPINE W WO CONTRAST  Result Date: 09/28/2022 CLINICAL DATA:  Better Ulyess Mort prostate cancer with abnormal PET CT and/. Your evaluate T10 talking EXAM: MRI THORACIC WITHOUT AND WITH CONTRAST TECHNIQUE: Multiplanar and multiecho pulse sequences of the thoracic spine were obtained without and with intravenous contrast. CONTRAST:  61m GADAVIST GADOBUTROL 1 MMOL/ML IV SOLN COMPARISON:  None Available. FINDINGS: Alignment:  Normal Vertebrae: 2.2 cm lesion in the vertebral body of T10. Subcentimeter lesions also seen in the T4 spinous process, T9 posterior body, and T11 left para median body. No fracture or extraosseous tumor extension. Cord:  No evidence of intrathecal metastasis. Paraspinal and other soft tissues: Negative for paravertebral mass or inflammation. Disc levels: No significant degenerative change.  No neural impingement. 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 Additional subcentimeter deposits in the T4 spinous process and T9, T11 vertebral bodies. No fracture or soft tissue extension. Electronically Signed   By: JJorje GuildM.D.   On: 09/28/2022 20:44   NM PET Image Initial (PI) Skull Base To Thigh (F-18 FDG)  Result Date: 09/20/2022 CLINICAL DATA:  Initial treatment strategy for pulmonary nodules. History of prostate cancer. EXAM: NUCLEAR MEDICINE PET SKULL BASE TO THIGH TECHNIQUE: 9.76 mCi F-18 FDG was injected intravenously. Full-ring PET imaging was performed from the skull base to thigh after the radiotracer. CT data was obtained and used for attenuation correction and anatomic localization. Fasting blood glucose: 104 mg/dl COMPARISON:  Chest CT 08/31/2022,  cardiac CT 08/29/2022 and abdominal CT 10/20/2009. FINDINGS: Mediastinal blood pool activity: SUV max 1.7 NECK: No hypermetabolic cervical lymph nodes are identified.Physiologic activity within the muscles of phonation.No suspicious activity identified within the pharyngeal mucosal space. Incidental CT findings: none CHEST: There are no hypermetabolic mediastinal, hilar or axillary lymph nodes. No hypermetabolic pulmonary activity. Specifically, there is no hypermetabolic activity seen within the previously demonstrated small pulmonary nodules which measure up to 8 mm in the right lower lobe on image 48/7. Most of these nodules are too small to optimally evaluate by PET-CT. Incidental CT findings: Atherosclerosis of the aorta, great vessels and coronary arteries. There are calcifications at the left ventricular apex. ABDOMEN/PELVIS: There is no hypermetabolic activity within the liver, adrenal glands, spleen or pancreas. There is no hypermetabolic nodal activity in the abdomen or pelvis. No hypermetabolic activity associated with the prostate gland. Incidental CT findings: Stable probable small cyst in the left hepatic lobe, without hypermetabolic activity and present on remote abdominal CT. Mild aortic and branch vessel atherosclerosis. Prostate brachytherapy seeds  are noted. SKELETON: Previously demonstrated lytic lesion within the left aspect of the T10 vertebral body is intensely hypermetabolic with an SUV max of 16.9. This lesion measures 1.7 x 1.2 cm on image 56/7 and is new compared with the remote abdominal CT. There is an additional hypermetabolic lytic lesion in the left femoral neck which measures approximately 2.9 cm on image 204/4 and has an SUV max of 6.5. This lesion also appears new from the remote abdominal CT. No other hypermetabolic lesions are identified within the axial or proximal appendicular skeleton. There are no blastic lesions or pathologic fractures. Incidental CT findings: Mild  spondylosis. IMPRESSION: 1. The recently demonstrated pulmonary nodules do not demonstrate any hypermetabolic activity, although are suboptimally evaluated due to size. Based on the morphology of the right lower lobe lesion and the accompanying osseous findings, metastatic disease is not excluded. Recommend CT follow-up in 3-6 months. 2. Hypermetabolic lytic lesions in the T10 vertebral body and left femoral neck concerning for metastatic disease or multiple myeloma. These lesions are not typical for metastatic prostate cancer. Based on location, the left femoral neck lesion is at risk for pathologic fracture. Recommend MRI of the thoracic spine without and with contrast for further evaluation. Tissue sampling likely warranted. 3. No other findings suspicious for metastatic disease. 4. Coronary and Aortic Atherosclerosis (ICD10-I70.0). Electronically Signed   By: Richardean Sale M.D.   On: 09/20/2022 09:10    ASSESSMENT AND PLAN: This is a very pleasant 82 years old white male with highly suspicious multiple myeloma presented with multiple lytic lesions involving T10, left femoral neck as well as T4, T9 and T11 vertebral bodies. 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 I recommended for the patient to have full myeloma panel performed today. I will also arrange for the patient to have a bone marrow biopsy and aspirate for confirmation of his diagnosis. He has an appointment with Dr. Valeta Harms and he may need bronchoscopy and biopsy of the lung lesion to rule out any other malignancy. Regarding the left femoral neck lesion, he has an appointment with Dr. Marlou Sa with orthopedic surgery for evaluation.  He is also seen by Dr. Tammi Klippel from radiation oncology. I will see the patient back for follow-up visit in 2 weeks for evaluation and discussion of his treatment options after completion of the staging  workup. The patient was advised to call immediately if he has any other concerning symptoms in the interval. The patient voices understanding of current disease status and treatment options and is in agreement with the current care plan.  All questions were answered. The patient knows to call the clinic with any problems, questions or concerns. We can certainly see the patient much sooner if necessary.  The total time spent in the appointment was 30 minutes.  Disclaimer: This note was dictated with voice recognition software. Similar sounding words can inadvertently be transcribed and may not be corrected upon review.

## 2022-10-19 NOTE — Progress Notes (Signed)
Office Visit Note   Patient: Ronald Duran           Date of Birth: Mar 12, 1941           MRN: [id number] Visit Date: 10/19/2022 Requested by: Curt Bears, MD [address] [place],  [place] [address] PCP: Corliss Blacker, MD  Subjective: Chief Complaint  Patient presents with   Other    Referred by oncology to discuss risk for pathologic fx    HPI: Ronald Duran is a 82 y.o. male who presents to the office reporting left femoral neck lesion.  Patient was referred from oncology to evaluate impending pathologic fracture in the left proximal femur.  Patient is currently asymptomatic in this region.  2 lesions were noted on PET scan within the past 2 weeks.  First lesion at the T10 vertebral body and second lesion in the left femoral neck.  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  He denies any symptoms in the left hip.  He has been able to walk 1 to 3 miles a day even up to last week.  He also plays golf 1-2 times a week.  Patient is on Coumadin.  This is for cardiac reasons..                ROS: All systems reviewed are negative as they relate to the chief complaint within the history of present illness.  Patient denies fevers or chills.  Assessment & Plan: Visit Diagnoses:  1. 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  This is a lesion that should be treated with prophylactic fixation.  Mild arthritis present on the left side moderate arthritis present on the right side.  As I will be out of town next week I have asked one of my partners Dr. Erlinda Hong to perform this surgery next Tuesday.  We will consult with [facility] in order to have them assist with Lovenox bridge.  Cautioned him that if he develops any groin symptoms at all he should go on a walker or crutches and go  nonweightbearing prior to the surgery but I doubt that will be the case based on his current level of symptoms.  Follow-Up Instructions: No follow-ups on file.   Orders:  Orders Placed This Encounter  Procedures   XR HIP UNILAT W OR W/O PELVIS 2-3 VIEWS LEFT   XR FEMUR MIN 2 VIEWS LEFT   No orders of the defined types were placed in this encounter.     Procedures: No procedures performed   Clinical Data: No additional findings.  Objective: Vital Signs: There were no vitals taken for this visit.  Physical Exam:  Constitutional: Patient appears well-developed HEENT:  Head: Normocephalic Eyes:EOM are normal Neck: Normal range of motion Cardiovascular: Normal rate Pulmonary/chest: Effort normal Neurologic: Patient is alert Skin: Skin is warm Psychiatric: Patient has normal mood and affect  Ortho Exam: Ortho exam demonstrates normal gait alignment.  No nerve root tension signs.  5 out of 5 ankle dorsiflexion plantarflexion quad and hamstring strength.  Slightly diminished range of motion in both hips with internal rotation but this is not painful.  Hip flexion strength 5+ out of 5.  Specialty Comments:  No specialty comments available.  Imaging: XR HIP UNILAT W OR W/O PELVIS 2-3  VIEWS LEFT  Result Date: 10/19/2022 AP pelvis lateral view left hip reviewed.  Lytic lesion present in the middle of the femoral neck.  No cortical involvement.  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 s/p MI with LAD stent placement December, 1999 -last stress test November 2008 with no EKG evidence of ischemia  b. cath 07/23/2015 after abnormal ETT and nuc, occluded mid LAD stent with good distal collateral, medical therapy    Cardiomyopathy ([place])    Ischemic cardiomyopathy with EF 40% by echo 2005   Dyslipidemia    Erectile dysfunction    Hiatal hernia    with esophageal strictures and dilatations in the past - Dr. Delfin Edis   History of nuclear stress test    Myoview 12/16: EF 47%, anterior, anteroseptal and inferoseptal defect suggesting infarct with peri-infarct ischemia, intermediate risk   HTN (hypertension)    Hx of seasonal allergies    Hyperlipidemia    PAD (peripheral artery disease) ([place])    Pre-diabetes    Prostate cancer ([place])    with seed implant therapy following Lupron therapy in 2008 and 2009 - followed by [name]   Ringworm    Vitamin D deficiency     Family History  Problem Relation Age of Onset   CVA Mother    CVA Father 11   CAD Sister     Past Surgical History:  Procedure Laterality Date   CARDIAC CATHETERIZATION N/A 07/23/2015   Procedure: Left Heart Cath and Coronary Angiography;  Surgeon: Belva Crome, MD;  Location: [facility];  Service: Cardiovascular;  Laterality: N/A;   CARDIOVERSION N/A 06/03/2022   Procedure: CARDIOVERSION;  Surgeon: Fay Records, MD;  Location: [facility];  Service: Cardiovascular;  Laterality: N/A;   COLONOSCOPY WITH PROPOFOL N/A 11/24/2015   Procedure: COLONOSCOPY  WITH PROPOFOL;  Surgeon: Garlan Fair, MD;  Location: [facility];  Service: Endoscopy;  Laterality: N/A;   esophageal strictures with dilatations in the past per Dr. Delfin Edis     last '98   HERNIA REPAIR Left    IR BONE TUMOR(S)RF ABLATION  10/11/2022   IR KYPHO THORACIC WITH BONE BIOPSY  10/11/2022   LAD stent placement - Dr. Daneen Schick     left inguinal hernia repair - Dr. Magdalene River     prostatic radioactive seed implantation - Dr. Valere Dross     '09   Social History   Occupational History   Not on file  Tobacco Use   Smoking status: Never   Smokeless tobacco: Never  Vaping Use   Vaping Use: Never used  Substance and Sexual Activity   Alcohol use: Yes    Alcohol/week: 0.0 standard drinks of alcohol    Comment: occasionally for special occasions     Drug use: No   Sexual activity: Not on file

## 2022-10-19 NOTE — Telephone Encounter (Signed)
Received call from [place] at Dr. Phoebe Sharps office that next procedure has been scheduled for 3/19 and patient will need updated Lovenox bridge instructions.  3/13: Last dose of warfarin.  3/14: No warfarin or enoxaparin (Lovenox).  3/15: Inject enoxaparin 80 mg in the fatty abdominal tissue at least 2 inches from the belly button twice a day about 12 hours apart, 8am and 8pm rotate sites. No warfarin.  3/16: Inject enoxaparin in the fatty tissue every 12 hours, 8am and 8pm. No warfarin.  3/17: Inject enoxaparin in the fatty tissue every 12 hours, 8am and 8pm. No warfarin.  3/18: Inject enoxaparin in the fatty tissue in the morning at 8 am (No PM dose). No warfarin.  3/19: Procedure Day - No enoxaparin - Resume warfarin in the evening or as directed by doctor (take an extra half tablet with usual dose for 2 days then resume normal dose).  3/20: Resume enoxaparin inject in the fatty tissue every 12 hours and take warfarin  3/21: Inject enoxaparin in the fatty tissue every 12 hours and take warfarin  3/22: Inject enoxaparin in the fatty tissue every 12 hours and take warfarin  3/23: Inject enoxaparin in the fatty tissue every 12 hours and take warfarin  3/24: Inject enoxaparin in the fatty tissue every 12 hours and take warfarin  3/25: warfarin appt to check INR.

## 2022-10-20 ENCOUNTER — Other Ambulatory Visit: Payer: Self-pay | Admitting: Physician Assistant

## 2022-10-20 ENCOUNTER — Telehealth: Payer: Self-pay | Admitting: Orthopaedic Surgery

## 2022-10-20 LAB — KAPPA/LAMBDA LIGHT CHAINS
Kappa free light chain: 12.5 mg/L (ref 3.3–19.4)
Kappa, lambda light chain ratio: 1.44 (ref 0.26–1.65)
Lambda free light chains: 8.7 mg/L (ref 5.7–26.3)

## 2022-10-20 LAB — BETA 2 MICROGLOBULIN, SERUM: Beta-2 Microglobulin: 2 mg/L (ref 0.6–2.4)

## 2022-10-20 NOTE — Pre-Procedure Instructions (Signed)
Surgical Instructions    Your procedure is scheduled on Tuesday, October 25, 2022 at 7:30 AM.  Report to [facility] Main Entrance "A" at 5:30 A.M., then check in with the Admitting office.  Call this number if you have problems the morning of surgery:  [phone]   If you have any questions prior to your surgery date call [phone]: Open Monday-Friday 8am-4pm  *If you experience any cold or flu symptoms such as cough, fever, chills, shortness of breath, etc. between now and your scheduled surgery, please notify us.*    Remember:  Do not eat after midnight the night before your surgery  You may drink clear liquids until 4:30 AM the morning of your surgery.   Clear liquids allowed are: Water, Non-Citrus Juices (without pulp), Carbonated Beverages, Clear Tea, Black Coffee Only (NO MILK, CREAM OR POWDERED CREAMER of any kind), and Gatorade.  Patient Instructions  The night before surgery:  No food after midnight. ONLY clear liquids after midnight  The day of surgery (if you have diabetes): Drink ONE (1) 12 oz G2 given to you in your pre admission testing appointment by 4:30 AM the morning of surgery. Drink in one sitting. Do not sip.  This drink was given to you during your hospital  pre-op appointment visit.  Nothing else to drink after completing the  12 oz bottle of G2.         If you have questions, please contact your surgeon's office.     Take these medicines the morning of surgery with A SIP OF WATER:  amLODipine (NORVASC)  cetirizine (ZYRTEC)  metoprolol succinate (TOPROL XL)  omeprazole (PRILOSEC)  nitroGLYCERIN (NITROSTAT) - if needed   *Last dose of taking warfarin (COUMADIN) should be on 10/19/22.*  *Start enoxaparin (LOVENOX) 10/21/22 twice daily, 8a and 8p.*  *Do not enoxaparin (LOVENOX) the day of surgery 10/25/22.*  As of today, STOP taking any Aspirin (unless otherwise instructed by your surgeon) Aleve, Naproxen, Ibuprofen, Motrin, Advil, Goody's, BC's,  all herbal medications, fish oil, and all vitamins.                     Do NOT Smoke (Tobacco/Vaping) for 24 hours prior to your procedure.  If you use a CPAP at night, you may bring your mask/headgear for your overnight stay.   Contacts, glasses, piercing's, hearing aid's, dentures or partials may not be worn into surgery, please bring cases for these belongings.    For patients admitted to the hospital, discharge time will be determined by your treatment team.   Patients discharged the day of surgery will not be allowed to drive home, and someone needs to stay with them for 24 hours.  SURGICAL WAITING ROOM VISITATION Patients having surgery or a procedure may have 2 support people in the waiting area. Visitors may stay in the waiting area during the procedure and switch out with other visitors if needed. Only 1 support person is allowed in the pre-op area with the patient AFTER the patient is prepped. This person cannot be switched out.  Children under the age of 70 must have an adult accompany them who is not the patient. If the patient needs to stay at the hospital during part of their recovery, the visitor guidelines for inpatient rooms apply.  Please refer to the [facility] website for the visitor guidelines for Inpatients (after your surgery is over and you are in a regular room).    Special instructions:   [facility]- Preparing  For Surgery  Before surgery, you can play an important role. Because skin is not sterile, your skin needs to be as free of germs as possible. You can reduce the number of germs on your skin by washing with CHG (chlorahexidine gluconate) Soap before surgery.  CHG is an antiseptic cleaner which kills germs and bonds with the skin to continue killing germs even after washing.    Oral Hygiene is also important to reduce your risk of infection.  Remember - BRUSH YOUR TEETH THE MORNING OF SURGERY WITH YOUR REGULAR TOOTHPASTE  Please do not use if you have an  allergy to CHG or antibacterial soaps. If your skin becomes reddened/irritated stop using the CHG.  Do not shave (including legs and underarms) for at least 48 hours prior to first CHG shower. It is OK to shave your face.  Please follow these instructions carefully.   Shower the NIGHT BEFORE SURGERY and the MORNING OF SURGERY  If you chose to wash your hair, wash your hair first as usual with your normal shampoo.  After you shampoo, rinse your hair and body thoroughly to remove the shampoo.  Use CHG Soap as you would any other liquid soap. You can apply CHG directly to the skin and wash gently with a scrungie or a clean washcloth.   Apply the CHG Soap to your body ONLY FROM THE NECK DOWN (neck, arms, chest, abdomen, legs, and back).  Do not use on open wounds or open sores. Avoid contact with your eyes, ears, mouth and genitals (private parts). Wash Face and genitals (private parts)  with your normal soap.   Wash thoroughly, paying special attention to the area where your surgery will be performed.  Thoroughly rinse your body with warm water from the neck down.  DO NOT shower/wash with your normal soap after using and rinsing off the CHG Soap.  Pat yourself dry with a CLEAN TOWEL.  Wear CLEAN PAJAMAS to bed the night before surgery  Place CLEAN SHEETS on your bed the night before your surgery  DO NOT SLEEP WITH PETS.   Day of Surgery: Take a shower with CHG soap. Do not wear lotions, powders, perfumes/colognes, or deodorant. Do not wear jewelry or makeup Do not shave 48 hours prior to surgery.  Men may shave face and neck. Do not wear nail polish, gel polish, artificial nails, or any other type of covering on natural nails (fingers and toes) If you have artificial nails or gel coating that need to be removed by a nail salon, please have this removed prior to surgery. Artificial nails or gel coating may interfere with anesthesia's ability to adequately monitor your vital  signs. Wear Clean/Comfortable clothing the morning of surgery Do not bring valuables to the hospital.  [facility] is not responsible for any belongings or valuables. Remember to brush your teeth WITH YOUR REGULAR TOOTHPASTE.   Please read over the following fact sheets that you were given.  If you received a COVID test during your pre-op visit  it is requested that you wear a mask when out in public, stay away from anyone that may not be feeling well and notify your surgeon if you develop symptoms. If you have been in contact with anyone that has tested positive in the last 10 days please notify you surgeon.

## 2022-10-20 NOTE — Telephone Encounter (Signed)
[phone] Promise..please call patients wife.Marland Kitchen

## 2022-10-20 NOTE — Telephone Encounter (Signed)
We have not sent in any medication

## 2022-10-20 NOTE — Telephone Encounter (Signed)
Medication called into wrong pharmacy. Should have been calledla into [facility]

## 2022-10-21 ENCOUNTER — Ambulatory Visit (INDEPENDENT_AMBULATORY_CARE_PROVIDER_SITE_OTHER): Payer: Medicare Other | Admitting: Pulmonary Disease

## 2022-10-21 ENCOUNTER — Telehealth: Payer: Self-pay | Admitting: Pulmonary Disease

## 2022-10-21 ENCOUNTER — Telehealth: Payer: Self-pay | Admitting: *Deleted

## 2022-10-21 ENCOUNTER — Telehealth: Payer: Self-pay

## 2022-10-21 ENCOUNTER — Encounter: Payer: Self-pay | Admitting: Pulmonary Disease

## 2022-10-21 ENCOUNTER — Encounter (HOSPITAL_COMMUNITY)
Admission: RE | Admit: 2022-10-21 | Discharge: 2022-10-21 | Disposition: A | Discharge status: Home / Self Care | Payer: Medicare Other | Source: Ambulatory Visit | Attending: Orthopaedic Surgery | Admitting: Orthopaedic Surgery

## 2022-10-21 ENCOUNTER — Other Ambulatory Visit: Payer: Self-pay

## 2022-10-21 ENCOUNTER — Encounter (HOSPITAL_COMMUNITY): Payer: Self-pay

## 2022-10-21 VITALS — BP 130/80 | HR 88 | Ht 72.0 in | Wt 192.6 lb

## 2022-10-21 DIAGNOSIS — I4891 Unspecified atrial fibrillation: Secondary | ICD-10-CM | POA: Diagnosis not present

## 2022-10-21 DIAGNOSIS — I2510 Atherosclerotic heart disease of native coronary artery without angina pectoris: Secondary | ICD-10-CM | POA: Insufficient documentation

## 2022-10-21 DIAGNOSIS — I739 Peripheral vascular disease, unspecified: Secondary | ICD-10-CM | POA: Diagnosis not present

## 2022-10-21 DIAGNOSIS — Z85828 Personal history of other malignant neoplasm of skin: Secondary | ICD-10-CM

## 2022-10-21 DIAGNOSIS — I10 Essential (primary) hypertension: Secondary | ICD-10-CM | POA: Diagnosis not present

## 2022-10-21 DIAGNOSIS — R918 Other nonspecific abnormal finding of lung field: Secondary | ICD-10-CM

## 2022-10-21 DIAGNOSIS — I252 Old myocardial infarction: Secondary | ICD-10-CM | POA: Insufficient documentation

## 2022-10-21 DIAGNOSIS — Z7901 Long term (current) use of anticoagulants: Secondary | ICD-10-CM | POA: Insufficient documentation

## 2022-10-21 DIAGNOSIS — I255 Ischemic cardiomyopathy: Secondary | ICD-10-CM | POA: Insufficient documentation

## 2022-10-21 DIAGNOSIS — Z01818 Encounter for other preprocedural examination: Secondary | ICD-10-CM | POA: Insufficient documentation

## 2022-10-21 DIAGNOSIS — Z8546 Personal history of malignant neoplasm of prostate: Secondary | ICD-10-CM | POA: Insufficient documentation

## 2022-10-21 DIAGNOSIS — Z85820 Personal history of malignant melanoma of skin: Secondary | ICD-10-CM

## 2022-10-21 DIAGNOSIS — C7951 Secondary malignant neoplasm of bone: Secondary | ICD-10-CM

## 2022-10-21 DIAGNOSIS — R911 Solitary pulmonary nodule: Secondary | ICD-10-CM | POA: Diagnosis not present

## 2022-10-21 DIAGNOSIS — Z789 Other specified health status: Secondary | ICD-10-CM

## 2022-10-21 DIAGNOSIS — Z955 Presence of coronary angioplasty implant and graft: Secondary | ICD-10-CM | POA: Diagnosis not present

## 2022-10-21 HISTORY — DX: Unspecified atrial fibrillation: I48.91

## 2022-10-21 LAB — IGG, IGA, IGM
IgA: 82 mg/dL (ref 61–437)
IgG (Immunoglobin G), Serum: 2708 mg/dL — ABNORMAL HIGH (ref 603–1613)
IgM (Immunoglobulin M), Srm: 62 mg/dL (ref 15–143)

## 2022-10-21 NOTE — Telephone Encounter (Signed)
Patient with diagnosis of afib on warfarin for anticoagulation.    Procedure: robotic assisted navigational bronchoscopy Date of procedure: 11/15/22  CHA2DS2-VASc Score = 7  This indicates a 11.2% annual risk of stroke. The patient's score is based upon: CHF History: 1 HTN History: 1 Diabetes History: 0 Stroke History: 2 Vascular Disease History: 1 Age Score: 2 Gender Score: 0  CrCl 63mL/min Platelet count 173K  Per office protocol, patient can hold warfarin for 5 days prior to procedure. Patient will need bridging with Lovenox (enoxaparin) around procedure which will be coordinated at [facility] where pt is followed. He is currently being bridged for another procedure as well. I mentioned in a prior clearance note that he qualifies for a DOAC which would avoid the need for bridging any time he holds his anticoag. He wasn't interested at that time but may be worth asking about again given his frequent procedures and need for Lovenox bridging.  **This guidance is not considered finalized until pre-operative APP has relayed final recommendations.**

## 2022-10-21 NOTE — Progress Notes (Signed)
Synopsis: Referred in January 2024 for pulmonary nodule by Corliss Blacker, MD  Subjective:   PATIENT ID: Ronald Duran GENDER: male DOB: 11-19-1940, MRN: [id number]  Chief Complaint  Patient presents with   Follow-up    F/up    This is a 82 year old gentleman, past medical history of coronary disease, ischemic cardiomyopathy, hiatal hernia, hypertension, hyperlipidemia, vitamin D deficiency.  Patient was diagnosed with prostate cancer in 2008 was given radiation seed implant and Lupron therapy.  He also had a melanoma of the right arm that he states was superficial and was treated with resection and a topical cream by his dermatologist.  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  He presents today after having a workup regarding his atrial fibrillation.  He had a coronary calcium scoring CT that showed right lower lobe pulmonary nodule followed by CT scan of the chest which found other small pulmonary nodules and a dominant 9 mm spiculated nodule in the right lower lobe.   OV 09/23/2022: Here today for follow-up after nuclear medicine pet imaging.Patient had nuclear medicine PET scan on 09/19/2022 this revealed no significant hypermetabolic uptake within the lesion.  Felt like there was suboptimal evaluation of this there was some osseous findings within the T10 vertebral body and the left femoral neck concerning for potential underlying metastatic disease.  Did not feel that these lesions were typical for prostate cancer therefore underlying disease such as multiple myeloma.  Could be possibilitie  OV 10/21/2022: Here today for follow-up.  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  He was initially seen by me for a spiculated 9 mm nodule in the right lower lobe.With the new findings of multiple myeloma it makes me question whether or not we should consider biopsying the lung lesions to confirm a separate malignancy.  He  has met with medical oncology to discuss this and have agreed to pursue biopsy of the lung lesion.  He has an appointment scheduled next week to have a pathologic fracture of the left hip.  This is being operated on 10/25/2022.  We reviewed his CT imaging today in the office.    Past Medical History:  Diagnosis Date   CAD (coronary artery disease)    a. s/p MI with LAD stent placement December, 1999 -last stress test November 2008 with no EKG evidence of ischemia  b. cath 07/23/2015 after abnormal ETT and nuc, occluded mid LAD stent with good distal collateral, medical therapy    Cardiomyopathy ([place])    Ischemic cardiomyopathy with EF 40% by echo 2005   Dyslipidemia    Erectile dysfunction    Hiatal hernia    with esophageal strictures and dilatations in the past - Dr. Delfin Edis   History of nuclear stress test    Myoview 12/16: EF 47%, anterior, anteroseptal and inferoseptal defect suggesting infarct with peri-infarct ischemia, intermediate risk   HTN (hypertension)    Hx of seasonal allergies    Hyperlipidemia    PAD (peripheral artery disease) ([place])    Pre-diabetes    Prostate cancer ([place])    with seed implant therapy following Lupron therapy in 2008 and 2009 - followed by [name]   Ringworm    Vitamin D deficiency      Family History  Problem Relation Age of Onset   CVA Mother    CVA Father 20   CAD Sister      Past Surgical History:  Procedure Laterality Date   CARDIAC CATHETERIZATION N/A 07/23/2015   Procedure: Left Heart Cath and Coronary Angiography;  Surgeon: Belva Crome, MD;  Location: [facility];  Service: Cardiovascular;  Laterality: N/A;   CARDIOVERSION N/A 06/03/2022   Procedure: CARDIOVERSION;  Surgeon: Fay Records, MD;  Location: [facility];  Service: Cardiovascular;  Laterality: N/A;   COLONOSCOPY WITH PROPOFOL N/A 11/24/2015   Procedure: COLONOSCOPY WITH PROPOFOL;  Surgeon: Garlan Fair, MD;  Location: [facility];  Service: Endoscopy;   Laterality: N/A;   esophageal strictures with dilatations in the past per Dr. Delfin Edis     last '98   HERNIA REPAIR Left    IR BONE TUMOR(S)RF ABLATION  10/11/2022   IR KYPHO THORACIC WITH BONE BIOPSY  10/11/2022   LAD stent placement - Dr. Daneen Schick     left inguinal hernia repair - Dr. Magdalene River     prostatic radioactive seed implantation - Dr. Valere Dross     '09    Social History   Socioeconomic History   Marital status: Married    Spouse name: Not on file   Number of children: Not on file   Years of education: Not on file   Highest education level: Not on file  Occupational History   Not on file  Tobacco Use   Smoking status: Never   Smokeless tobacco: Never  Vaping Use   Vaping Use: Never used  Substance and Sexual Activity   Alcohol use: Yes    Alcohol/week: 0.0 standard drinks of alcohol    Comment: occasionally for special occasions     Drug use: No   Sexual activity: Not on file  Other Topics Concern   Not on file  Social History Narrative   Not on file   Social Determinants of Health   Financial Resource Strain: Not on file  Food Insecurity: No Food Insecurity (10/12/2022)   Hunger Vital Sign    Worried About Running Out of Food in the Last Year: Never true    Ran Out of Food in the Last Year: Never true  Transportation Needs: No Transportation Needs (10/12/2022)   PRAPARE - Hydrologist (Medical): No    Lack of Transportation (Non-Medical): No  Physical Activity: Not on file  Stress: Not on file  Social Connections: Not on file  Intimate Partner Violence: Not At Risk (10/12/2022)   Humiliation, Afraid, Rape, and Kick questionnaire    Fear of Current or Ex-Partner: No    Emotionally Abused: No    Physically Abused: No    Sexually Abused: No     No Known Allergies   Outpatient Medications Prior to Visit  Medication Sig Dispense Refill   amLODipine (NORVASC) 5 MG tablet Take 1 tablet (5 mg total) by mouth daily. 90 tablet  3   atorvastatin (LIPITOR) 40 MG tablet Take 40 mg by mouth at bedtime.     B Complex Vitamins (B COMPLEX 100) tablet Take 1 tablet by mouth daily. Balance     benazepril-hydrochlorthiazide (LOTENSIN HCT) 20-12.5 MG tablet Take 1 tablet by mouth daily. 90 tablet 3   cetirizine (ZYRTEC) 10 MG tablet Take 10 mg by mouth daily.     Cholecalciferol (VITAMIN D3) 2000 UNITS TABS Take 2,000 Units by mouth daily.     Coenzyme Q10 (CO Q-10) 100 MG CAPS Take 100 mg by mouth daily with supper.     enoxaparin (LOVENOX) 80 MG/0.8ML injection Inject 0.8 mLs (80 mg  total) into the skin every 12 (twelve) hours. 13.6 mL 0   metoprolol succinate (TOPROL XL) 25 MG 24 hr tablet Take 1 tablet (25 mg total) by mouth daily. 90 tablet 3   Multiple Vitamins-Minerals (CENTRUM) tablet Take 1 tablet by mouth daily.     nitroGLYCERIN (NITROSTAT) 0.4 MG SL tablet PLACE 1 TABLET UNDER THE TONGUE IF NEEDED EVERY 5 MINUTES FOR CHEST PAIN FOR 3 DOSES IF NO RELIEF AFTER FIRST DOSE CALL 911. 25 tablet 9   omeprazole (PRILOSEC) 20 MG capsule Take 20 mg by mouth daily before breakfast.     warfarin (COUMADIN) 5 MG tablet TAKE 1/2 TO 1 TABLET BY MOUTH DAILY AS DIRECTED BY COUMADIN CLINIC (Patient taking differently: Take 2.5-5 mg by mouth See admin instructions. Take 2.5 mg by mouth on Monday, Wednesday and Friday and take 5 mg on Tuesday, Thursday, Saturday and Sunday) 100 tablet 0   No facility-administered medications prior to visit.    Review of Systems  Constitutional:  Negative for chills, fever, malaise/fatigue and weight loss.  HENT:  Negative for hearing loss, sore throat and tinnitus.   Eyes:  Negative for blurred vision and double vision.  Respiratory:  Negative for cough, hemoptysis, sputum production, shortness of breath, wheezing and stridor.   Cardiovascular:  Negative for chest pain, palpitations, orthopnea, leg swelling and PND.  Gastrointestinal:  Negative for abdominal pain, constipation, diarrhea, heartburn,  nausea and vomiting.  Genitourinary:  Negative for dysuria, hematuria and urgency.  Musculoskeletal:  Positive for back pain and joint pain. Negative for myalgias.  Skin:  Negative for itching and rash.  Neurological:  Negative for dizziness, tingling, weakness and headaches.  Endo/Heme/Allergies:  Negative for environmental allergies. Does not bruise/bleed easily.  Psychiatric/Behavioral:  Negative for depression. The patient is not nervous/anxious and does not have insomnia.   All other systems reviewed and are negative.    Objective:  Physical Exam Vitals reviewed.  Constitutional:      General: He is not in acute distress.    Appearance: He is well-developed.  HENT:     Head: Normocephalic and atraumatic.  Eyes:     General: No scleral icterus.    Conjunctiva/sclera: Conjunctivae normal.     Pupils: Pupils are equal, round, and reactive to light.  Neck:     Vascular: No JVD.     Trachea: No tracheal deviation.  Cardiovascular:     Rate and Rhythm: Normal rate and regular rhythm.     Heart sounds: Normal heart sounds. No murmur heard. Pulmonary:     Effort: Pulmonary effort is normal. No tachypnea, accessory muscle usage or respiratory distress.     Breath sounds: No stridor. No wheezing, rhonchi or rales.  Abdominal:     General: There is no distension.     Palpations: Abdomen is soft.     Tenderness: There is no abdominal tenderness.  Musculoskeletal:        General: No tenderness.     Cervical back: Neck supple.  Lymphadenopathy:     Cervical: No cervical adenopathy.  Skin:    General: Skin is warm and dry.     Capillary Refill: Capillary refill takes less than 2 seconds.     Findings: No rash.  Neurological:     Mental Status: He is alert and oriented to person, place, and time.  Psychiatric:        Behavior: Behavior normal.      Vitals:   10/21/22 0957  BP: 130/80  Pulse: 88  SpO2: 98%  Weight: 192 lb 9.6 oz (87.4 kg)  Height: 6' (1.829 m)   98%  on RA BMI Readings from Last 3 Encounters:  10/21/22 26.12 kg/m  10/19/22 25.96 kg/m  10/12/22 26.18 kg/m   Wt Readings from Last 3 Encounters:  10/21/22 192 lb 9.6 oz (87.4 kg)  10/19/22 191 lb 6 oz (86.8 kg)  10/12/22 193 lb (87.5 kg)     CBC    Component Value Date/Time   WBC 9.6 10/11/2022 0801   RBC 4.97 10/11/2022 0801   HGB 16.9 10/11/2022 0801   HGB 17.1 (H) 10/05/2022 1112   HGB 16.5 08/25/2022 1439   HCT 48.7 10/11/2022 0801   HCT 47.6 08/25/2022 1439   PLT 156 10/11/2022 0801   PLT 173 10/05/2022 1112   PLT 145 (L) 08/25/2022 1439   MCV 98.0 10/11/2022 0801   MCV 95 08/25/2022 1439   MCH 34.0 10/11/2022 0801   MCHC 34.7 10/11/2022 0801   RDW 13.5 10/11/2022 0801   RDW 13.0 08/25/2022 1439   LYMPHSABS 1.2 10/05/2022 1112   LYMPHSABS 1.4 08/25/2022 1439   MONOABS 0.7 10/05/2022 1112   EOSABS 0.1 10/05/2022 1112   EOSABS 0.1 08/25/2022 1439   BASOSABS 0.1 10/05/2022 1112   BASOSABS 0.1 08/25/2022 1439     Chest Imaging:  CT chest 08/31/2022: 1 cm right lower lobe spiculated pulmonary nodule concerning for malignancy. Other small subcentimeter pulmonary nodules. The patient's images have been independently reviewed by me.    Nuclear medicine pet imaging February [id number]: Hypermetabolic lesions within the thoracic spine as well as femoral head.  Also a small nodule that does not have any significant uptake. Concerning for possible metastatic disease to the bone. The patient's images have been independently reviewed by me.    Pulmonary Functions Testing Results:     No data to display          FeNO:   Pathology:   Echocardiogram:   Heart Catheterization:     Assessment & Plan:     ICD-10-CM   1. Lung nodule  R91.1 Procedural/ Surgical Case Request: ROBOTIC ASSISTED NAVIGATIONAL BRONCHOSCOPY    Ambulatory referral to Pulmonology    CT Super D Chest Wo Contrast    2. History of melanoma  Z85.820     3. History of squamous cell carcinoma  of skin  Z85.828     4. History of prostate cancer  Z85.46     5. Metastatic cancer to spine ([place])  C79.51     6. Lung nodules  R91.8     7. Nonsmoker  Z78.9       Discussion:  This is a 82 year old gentleman incidentally found pulmonary nodules.  He has a history of melanoma history of squamous of carcinoma of the skin, history of prostate.  Currently anticoagulated with atrial fibrillation on Coumadin.  His pet imaging revealed spinal metastasis and a lesion in the left hip.  He has orthopedic surgeries planned for possible pathologic fracture in the left hip next week.  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  He has already met with medical oncology.  As for his lung nodule we are going to consider biopsying this to rule out any other secondary malignancy.  Plan: Today in the office we talked about the risk of the procedure for bronchoscopy. With about the risk of bleeding and pneumothorax. Navigational bronchoscopy will be scheduled on 11/15/2022. This is an effort to give  him an additional week to recover after having hip surgery next week. Patient is agreeable to this plan. Pending upon the pathology results of lung we will decide next steps regarding this nodule.  If this is a primary lung cancer he be good candidate for SBRT.  We appreciate the referral. Thanks for this multidisciplinary team and management of his findings.    Current Outpatient Medications:    amLODipine (NORVASC) 5 MG tablet, Take 1 tablet (5 mg total) by mouth daily., Disp: 90 tablet, Rfl: 3   atorvastatin (LIPITOR) 40 MG tablet, Take 40 mg by mouth at bedtime., Disp: , Rfl:    B Complex Vitamins (B COMPLEX 100) tablet, Take 1 tablet by mouth daily. Balance, Disp: , Rfl:    benazepril-hydrochlorthiazide (LOTENSIN HCT) 20-12.5 MG tablet, Take 1 tablet by mouth daily., Disp: 90 tablet, Rfl: 3   cetirizine (ZYRTEC) 10 MG tablet, Take 10 mg by mouth  daily., Disp: , Rfl:    Cholecalciferol (VITAMIN D3) 2000 UNITS TABS, Take 2,000 Units by mouth daily., Disp: , Rfl:    Coenzyme Q10 (CO Q-10) 100 MG CAPS, Take 100 mg by mouth daily with supper., Disp: , Rfl:    enoxaparin (LOVENOX) 80 MG/0.8ML injection, Inject 0.8 mLs (80 mg total) into the skin every 12 (twelve) hours., Disp: 13.6 mL, Rfl: 0   metoprolol succinate (TOPROL XL) 25 MG 24 hr tablet, Take 1 tablet (25 mg total) by mouth daily., Disp: 90 tablet, Rfl: 3   Multiple Vitamins-Minerals (CENTRUM) tablet, Take 1 tablet by mouth daily., Disp: , Rfl:    nitroGLYCERIN (NITROSTAT) 0.4 MG SL tablet, PLACE 1 TABLET UNDER THE TONGUE IF NEEDED EVERY 5 MINUTES FOR CHEST PAIN FOR 3 DOSES IF NO RELIEF AFTER FIRST DOSE CALL 911., Disp: 25 tablet, Rfl: 9   omeprazole (PRILOSEC) 20 MG capsule, Take 20 mg by mouth daily before breakfast., Disp: , Rfl:    warfarin (COUMADIN) 5 MG tablet, TAKE 1/2 TO 1 TABLET BY MOUTH DAILY AS DIRECTED BY COUMADIN CLINIC (Patient taking differently: Take 2.5-5 mg by mouth See admin instructions. Take 2.5 mg by mouth on Monday, Wednesday and Friday and take 5 mg on Tuesday, Thursday, Saturday and Sunday), Disp: 100 tablet, Rfl: 0  Garner Nash, DO [facility] 10/21/2022 10:28 AM

## 2022-10-21 NOTE — Telephone Encounter (Signed)
   Patient Name: Ronald Duran  DOB: 29-May-1941 MRN: [id number]  Primary Cardiologist: Sinclair Grooms, MD (Inactive)  Clinical pharmacists have reviewed the patient's past medical history, labs, and current medications as part of preoperative protocol coverage. The following recommendations have been made:    Patient with diagnosis of afib on warfarin for anticoagulation.     Procedure: robotic assisted navigational bronchoscopy Date of procedure: 11/15/22   CHA2DS2-VASc Score = 7  This indicates a 11.2% annual risk of stroke. The patient's score is based upon: CHF History: 1 HTN History: 1 Diabetes History: 0 Stroke History: 2 Vascular Disease History: 1 Age Score: 2 Gender Score: 0   CrCl 50mL/min Platelet count 173K   Per office protocol, patient can hold warfarin for 5 days prior to procedure. Patient will need bridging with Lovenox (enoxaparin) around procedure which will be coordinated at [facility] where pt is followed. He is currently being bridged for another procedure as well. I mentioned in a prior clearance note that he qualifies for a DOAC which would avoid the need for bridging any time he holds his anticoag. He wasn't interested at that time but may be worth asking about again given his frequent procedures and need for Lovenox bridging.    I will route this recommendation to the requesting party via Epic fax function and remove from pre-op pool.  Please call with questions.  Lenna Sciara, NP 10/21/2022, 2:55 PM

## 2022-10-21 NOTE — Telephone Encounter (Signed)
Pt is to have a Robotic Assisted Navigational Bronchoscopy on 4/9  Per Dr. Valeta Harms: case for 4/9. we will need help from [facility] on bridging with lovenox and coumadin. he will need to hold his lovenox morning of the case... last dose the day before.

## 2022-10-21 NOTE — Telephone Encounter (Signed)
Patient states pre op team [facility] needs more information for Bronch procedure. Patient phone number is [phone].

## 2022-10-21 NOTE — Addendum Note (Signed)
Addended by: Alvin Critchley on: 10/21/2022 10:35 AM   Modules accepted: Orders

## 2022-10-21 NOTE — Patient Instructions (Signed)
Thank you for visiting Dr. Valeta Harms at [facility]. Today we recommend the following:  Orders Placed This Encounter  Procedures   Procedural/ Surgical Case Request: ROBOTIC ASSISTED NAVIGATIONAL BRONCHOSCOPY   CT Super D Chest Wo Contrast   Ambulatory referral to Pulmonology   Bronchoscopy on 04/09/204  Return in about 1 month (around 11/22/2022) for w/ Eric Form, np .    Please do your part to reduce the spread of COVID-19.

## 2022-10-21 NOTE — H&P (View-Only) (Signed)
 Synopsis: Referred in January 2024 for pulmonary nodule by Schwartzman, Neil R, MD  Subjective:   PATIENT ID: Ronald Duran GENDER: male DOB: 10/29/1940, MRN: [id number]  Chief Complaint  Patient presents with   Follow-up    F/up    This is a 81-year-old gentleman, past medical history of coronary disease, ischemic cardiomyopathy, hiatal hernia, hypertension, hyperlipidemia, vitamin D deficiency.  Patient was diagnosed with prostate cancer in 2008 was given radiation seed implant and Lupron therapy.  He also had a melanoma of the right arm that he states was superficial and was treated with resection and a topical cream by his dermatologist.  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  He presents today after having a workup regarding his atrial fibrillation.  He had a coronary calcium scoring CT that showed right lower lobe pulmonary nodule followed by CT scan of the chest which found other small pulmonary nodules and a dominant 9 mm spiculated nodule in the right lower lobe.   OV 09/23/2022: Here today for follow-up after nuclear medicine pet imaging.Patient had nuclear medicine PET scan on 09/19/2022 this revealed no significant hypermetabolic uptake within the lesion.  Felt like there was suboptimal evaluation of this there was some osseous findings within the T10 vertebral body and the left femoral neck concerning for potential underlying metastatic disease.  Did not feel that these lesions were typical for prostate cancer therefore underlying disease such as multiple myeloma.  Could be possibilitie  OV 10/21/2022: Here today for follow-up.  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  He was initially seen by me for a spiculated 9 mm nodule in the right lower lobe.With the new findings of multiple myeloma it makes me question whether or not we should consider biopsying the lung lesions to confirm a separate malignancy.  He  has met with medical oncology to discuss this and have agreed to pursue biopsy of the lung lesion.  He has an appointment scheduled next week to have a pathologic fracture of the left hip.  This is being operated on 10/25/2022.  We reviewed his CT imaging today in the office.    Past Medical History:  Diagnosis Date   CAD (coronary artery disease)    a. s/p MI with LAD stent placement December, 1999 -last stress test November 2008 with no EKG evidence of ischemia  b. cath 07/23/2015 after abnormal ETT and nuc, occluded mid LAD stent with good distal collateral, medical therapy    Cardiomyopathy (HCC)    Ischemic cardiomyopathy with EF 40% by echo 2005   Dyslipidemia    Erectile dysfunction    Hiatal hernia    with esophageal strictures and dilatations in the past - Dr. Dora Brodie   History of nuclear stress test    Myoview 12/16: EF 47%, anterior, anteroseptal and inferoseptal defect suggesting infarct with peri-infarct ischemia, intermediate risk   HTN (hypertension)    Hx of seasonal allergies    Hyperlipidemia    PAD (peripheral artery disease) (HCC)    Pre-diabetes    Prostate cancer (HCC)    with seed implant therapy following Lupron therapy in 2008 and 2009 - followed by Dr. Grapey   Ringworm    Vitamin D deficiency      Family History  Problem Relation Age of Onset   CVA Mother    CVA Father 51   CAD Sister      Past Surgical History:    Procedure Laterality Date   CARDIAC CATHETERIZATION N/A 07/23/2015   Procedure: Left Heart Cath and Coronary Angiography;  Surgeon: Henry W Smith, MD;  Location: [facility];  Service: Cardiovascular;  Laterality: N/A;   CARDIOVERSION N/A 06/03/2022   Procedure: CARDIOVERSION;  Surgeon: Ross, Paula V, MD;  Location: [facility];  Service: Cardiovascular;  Laterality: N/A;   COLONOSCOPY WITH PROPOFOL N/A 11/24/2015   Procedure: COLONOSCOPY WITH PROPOFOL;  Surgeon: Martin K Johnson, MD;  Location: [facility];  Service: Endoscopy;   Laterality: N/A;   esophageal strictures with dilatations in the past per Dr. Dora Brodie     last '98   HERNIA REPAIR Left    IR BONE TUMOR(S)RF ABLATION  10/11/2022   IR KYPHO THORACIC WITH BONE BIOPSY  10/11/2022   LAD stent placement - Dr. Henry Smith     left inguinal hernia repair - Dr. Michael Leone     prostatic radioactive seed implantation - Dr. Murray     '09    Social History   Socioeconomic History   Marital status: Married    Spouse name: Not on file   Number of children: Not on file   Years of education: Not on file   Highest education level: Not on file  Occupational History   Not on file  Tobacco Use   Smoking status: Never   Smokeless tobacco: Never  Vaping Use   Vaping Use: Never used  Substance and Sexual Activity   Alcohol use: Yes    Alcohol/week: 0.0 standard drinks of alcohol    Comment: occasionally for special occasions     Drug use: No   Sexual activity: Not on file  Other Topics Concern   Not on file  Social History Narrative   Not on file   Social Determinants of Health   Financial Resource Strain: Not on file  Food Insecurity: No Food Insecurity (10/12/2022)   Hunger Vital Sign    Worried About Running Out of Food in the Last Year: Never true    Ran Out of Food in the Last Year: Never true  Transportation Needs: No Transportation Needs (10/12/2022)   PRAPARE - Transportation    Lack of Transportation (Medical): No    Lack of Transportation (Non-Medical): No  Physical Activity: Not on file  Stress: Not on file  Social Connections: Not on file  Intimate Partner Violence: Not At Risk (10/12/2022)   Humiliation, Afraid, Rape, and Kick questionnaire    Fear of Current or Ex-Partner: No    Emotionally Abused: No    Physically Abused: No    Sexually Abused: No     No Known Allergies   Outpatient Medications Prior to Visit  Medication Sig Dispense Refill   amLODipine (NORVASC) 5 MG tablet Take 1 tablet (5 mg total) by mouth daily. 90 tablet  3   atorvastatin (LIPITOR) 40 MG tablet Take 40 mg by mouth at bedtime.     B Complex Vitamins (B COMPLEX 100) tablet Take 1 tablet by mouth daily. Balance     benazepril-hydrochlorthiazide (LOTENSIN HCT) 20-12.5 MG tablet Take 1 tablet by mouth daily. 90 tablet 3   cetirizine (ZYRTEC) 10 MG tablet Take 10 mg by mouth daily.     Cholecalciferol (VITAMIN D3) 2000 UNITS TABS Take 2,000 Units by mouth daily.     Coenzyme Q10 (CO Q-10) 100 MG CAPS Take 100 mg by mouth daily with supper.     enoxaparin (LOVENOX) 80 MG/0.8ML injection Inject 0.8 mLs (80 mg   total) into the skin every 12 (twelve) hours. 13.6 mL 0   metoprolol succinate (TOPROL XL) 25 MG 24 hr tablet Take 1 tablet (25 mg total) by mouth daily. 90 tablet 3   Multiple Vitamins-Minerals (CENTRUM) tablet Take 1 tablet by mouth daily.     nitroGLYCERIN (NITROSTAT) 0.4 MG SL tablet PLACE 1 TABLET UNDER THE TONGUE IF NEEDED EVERY 5 MINUTES FOR CHEST PAIN FOR 3 DOSES IF NO RELIEF AFTER FIRST DOSE CALL 911. 25 tablet 9   omeprazole (PRILOSEC) 20 MG capsule Take 20 mg by mouth daily before breakfast.     warfarin (COUMADIN) 5 MG tablet TAKE 1/2 TO 1 TABLET BY MOUTH DAILY AS DIRECTED BY COUMADIN CLINIC (Patient taking differently: Take 2.5-5 mg by mouth See admin instructions. Take 2.5 mg by mouth on Monday, Wednesday and Friday and take 5 mg on Tuesday, Thursday, Saturday and Sunday) 100 tablet 0   No facility-administered medications prior to visit.    Review of Systems  Constitutional:  Negative for chills, fever, malaise/fatigue and weight loss.  HENT:  Negative for hearing loss, sore throat and tinnitus.   Eyes:  Negative for blurred vision and double vision.  Respiratory:  Negative for cough, hemoptysis, sputum production, shortness of breath, wheezing and stridor.   Cardiovascular:  Negative for chest pain, palpitations, orthopnea, leg swelling and PND.  Gastrointestinal:  Negative for abdominal pain, constipation, diarrhea, heartburn,  nausea and vomiting.  Genitourinary:  Negative for dysuria, hematuria and urgency.  Musculoskeletal:  Positive for back pain and joint pain. Negative for myalgias.  Skin:  Negative for itching and rash.  Neurological:  Negative for dizziness, tingling, weakness and headaches.  Endo/Heme/Allergies:  Negative for environmental allergies. Does not bruise/bleed easily.  Psychiatric/Behavioral:  Negative for depression. The patient is not nervous/anxious and does not have insomnia.   All other systems reviewed and are negative.    Objective:  Physical Exam Vitals reviewed.  Constitutional:      General: He is not in acute distress.    Appearance: He is well-developed.  HENT:     Head: Normocephalic and atraumatic.  Eyes:     General: No scleral icterus.    Conjunctiva/sclera: Conjunctivae normal.     Pupils: Pupils are equal, round, and reactive to light.  Neck:     Vascular: No JVD.     Trachea: No tracheal deviation.  Cardiovascular:     Rate and Rhythm: Normal rate and regular rhythm.     Heart sounds: Normal heart sounds. No murmur heard. Pulmonary:     Effort: Pulmonary effort is normal. No tachypnea, accessory muscle usage or respiratory distress.     Breath sounds: No stridor. No wheezing, rhonchi or rales.  Abdominal:     General: There is no distension.     Palpations: Abdomen is soft.     Tenderness: There is no abdominal tenderness.  Musculoskeletal:        General: No tenderness.     Cervical back: Neck supple.  Lymphadenopathy:     Cervical: No cervical adenopathy.  Skin:    General: Skin is warm and dry.     Capillary Refill: Capillary refill takes less than 2 seconds.     Findings: No rash.  Neurological:     Mental Status: He is alert and oriented to person, place, and time.  Psychiatric:        Behavior: Behavior normal.      Vitals:   10/21/22 0957  BP: 130/80  Pulse: 88    SpO2: 98%  Weight: 192 lb 9.6 oz (87.4 kg)  Height: 6' (1.829 m)   98%  on RA BMI Readings from Last 3 Encounters:  10/21/22 26.12 kg/m  10/19/22 25.96 kg/m  10/12/22 26.18 kg/m   Wt Readings from Last 3 Encounters:  10/21/22 192 lb 9.6 oz (87.4 kg)  10/19/22 191 lb 6 oz (86.8 kg)  10/12/22 193 lb (87.5 kg)     CBC    Component Value Date/Time   WBC 9.6 10/11/2022 0801   RBC 4.97 10/11/2022 0801   HGB 16.9 10/11/2022 0801   HGB 17.1 (H) 10/05/2022 1112   HGB 16.5 08/25/2022 1439   HCT 48.7 10/11/2022 0801   HCT 47.6 08/25/2022 1439   PLT 156 10/11/2022 0801   PLT 173 10/05/2022 1112   PLT 145 (L) 08/25/2022 1439   MCV 98.0 10/11/2022 0801   MCV 95 08/25/2022 1439   MCH 34.0 10/11/2022 0801   MCHC 34.7 10/11/2022 0801   RDW 13.5 10/11/2022 0801   RDW 13.0 08/25/2022 1439   LYMPHSABS 1.2 10/05/2022 1112   LYMPHSABS 1.4 08/25/2022 1439   MONOABS 0.7 10/05/2022 1112   EOSABS 0.1 10/05/2022 1112   EOSABS 0.1 08/25/2022 1439   BASOSABS 0.1 10/05/2022 1112   BASOSABS 0.1 08/25/2022 1439     Chest Imaging:  CT chest 08/31/2022: 1 cm right lower lobe spiculated pulmonary nodule concerning for malignancy. Other small subcentimeter pulmonary nodules. The patient's images have been independently reviewed by me.    Nuclear medicine pet imaging February 2024: Hypermetabolic lesions within the thoracic spine as well as femoral head.  Also a small nodule that does not have any significant uptake. Concerning for possible metastatic disease to the bone. The patient's images have been independently reviewed by me.    Pulmonary Functions Testing Results:     No data to display          FeNO:   Pathology:   Echocardiogram:   Heart Catheterization:     Assessment & Plan:     ICD-10-CM   1. Lung nodule  R91.1 Procedural/ Surgical Case Request: ROBOTIC ASSISTED NAVIGATIONAL BRONCHOSCOPY    Ambulatory referral to Pulmonology    CT Super D Chest Wo Contrast    2. History of melanoma  Z85.820     3. History of squamous cell carcinoma  of skin  Z85.828     4. History of prostate cancer  Z85.46     5. 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 Lung nodules  R91.8     7. Nonsmoker  Z78.9       Discussion:  This is a 81-year-old gentleman incidentally found pulmonary nodules.  He has a history of melanoma history of squamous of carcinoma of the skin, history of prostate.  Currently anticoagulated with atrial fibrillation on Coumadin.  His pet imaging revealed spinal metastasis and a lesion in the left hip.  He has orthopedic surgeries planned for possible pathologic fracture in the left hip next week.  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  He has already met with medical oncology.  As for his lung nodule we are going to consider biopsying this to rule out any other secondary malignancy.  Plan: Today in the office we talked about the risk of the procedure for bronchoscopy. With about the risk of bleeding and pneumothorax. Navigational bronchoscopy will be scheduled on 11/15/2022. This is an effort to give   him an additional week to recover after having hip surgery next week. Patient is agreeable to this plan. Pending upon the pathology results of lung we will decide next steps regarding this nodule.  If this is a primary lung cancer he be good candidate for SBRT.  We appreciate the referral. Thanks for this multidisciplinary team and management of his findings.    Current Outpatient Medications:    amLODipine (NORVASC) 5 MG tablet, Take 1 tablet (5 mg total) by mouth daily., Disp: 90 tablet, Rfl: 3   atorvastatin (LIPITOR) 40 MG tablet, Take 40 mg by mouth at bedtime., Disp: , Rfl:    B Complex Vitamins (B COMPLEX 100) tablet, Take 1 tablet by mouth daily. Balance, Disp: , Rfl:    benazepril-hydrochlorthiazide (LOTENSIN HCT) 20-12.5 MG tablet, Take 1 tablet by mouth daily., Disp: 90 tablet, Rfl: 3   cetirizine (ZYRTEC) 10 MG tablet, Take 10 mg by mouth  daily., Disp: , Rfl:    Cholecalciferol (VITAMIN D3) 2000 UNITS TABS, Take 2,000 Units by mouth daily., Disp: , Rfl:    Coenzyme Q10 (CO Q-10) 100 MG CAPS, Take 100 mg by mouth daily with supper., Disp: , Rfl:    enoxaparin (LOVENOX) 80 MG/0.8ML injection, Inject 0.8 mLs (80 mg total) into the skin every 12 (twelve) hours., Disp: 13.6 mL, Rfl: 0   metoprolol succinate (TOPROL XL) 25 MG 24 hr tablet, Take 1 tablet (25 mg total) by mouth daily., Disp: 90 tablet, Rfl: 3   Multiple Vitamins-Minerals (CENTRUM) tablet, Take 1 tablet by mouth daily., Disp: , Rfl:    nitroGLYCERIN (NITROSTAT) 0.4 MG SL tablet, PLACE 1 TABLET UNDER THE TONGUE IF NEEDED EVERY 5 MINUTES FOR CHEST PAIN FOR 3 DOSES IF NO RELIEF AFTER FIRST DOSE CALL 911., Disp: 25 tablet, Rfl: 9   omeprazole (PRILOSEC) 20 MG capsule, Take 20 mg by mouth daily before breakfast., Disp: , Rfl:    warfarin (COUMADIN) 5 MG tablet, TAKE 1/2 TO 1 TABLET BY MOUTH DAILY AS DIRECTED BY COUMADIN CLINIC (Patient taking differently: Take 2.5-5 mg by mouth See admin instructions. Take 2.5 mg by mouth on Monday, Wednesday and Friday and take 5 mg on Tuesday, Thursday, Saturday and Sunday), Disp: 100 tablet, Rfl: 0  Adriell Polansky L Michalina Calbert, DO [facility] 10/21/2022 10:28 AM    

## 2022-10-21 NOTE — Progress Notes (Signed)
PCP - Syliva Overman Cardiologist - Candee Furbish was seeing Daneen Schick  PPM/ICD - Denies  Chest x-ray - N/I EKG - 07/14/22 Stress Test - 07/13/15 ECHO - 10/21/21 Cardiac Cath - 07/23/15  Sleep Study - Denies OSA  DM - Denies  Blood Thinner Instructions:Warfarin instructed to start Lovenox on 10/21/22 Last dose of warfarin on 10/19/22. No Lovenox on day of surgery. Aspirin Instructions:N/A  ERAS Protcol -Yes PRE-SURGERY Ensure given COVID TEST- N/A   Anesthesia review: Yes cardiac history  Patient denies shortness of breath, fever, cough and chest pain at PAT appointment  Patient denies any flu, PNA, respiratory virus or cold in the past two months.   All instructions explained to the patient, with a verbal understanding of the material. Patient agrees to go over the instructions while at home for a better understanding. The opportunity to ask questions was provided.

## 2022-10-21 NOTE — Telephone Encounter (Signed)
   Pre-operative Risk Assessment    Patient Name: GEONNI STIMPERT  DOB: Mar 07, 1941 MRN: [id number]      Request for Surgical Clearance    Procedure:  ROBOTIC ASSISTED NAVIGATIONAL BRONCHOSCOPY  Date of Surgery:  Clearance 11/15/22                                 Surgeon:  [name] Surgeon's Group or Practice Name:  [facility] Phone number:  [phone] Fax number:  [phone]   Type of Clearance Requested:   - Pharmacy:  Hold Warfarin (Coumadin) INSTRUCTION WHEN TO HOLD - BRIDGING WITH LOVENOX   Type of Anesthesia:  Not Indicated   Additional requests/questions:    SignedJacinta Shoe   10/21/2022, 1:43 PM

## 2022-10-21 NOTE — Telephone Encounter (Signed)
Spoke with patient who states that he does have another procedure coming up on 4/9 and I will put in formal clearance found in Epic.

## 2022-10-24 ENCOUNTER — Encounter: Payer: Self-pay | Admitting: Orthopaedic Surgery

## 2022-10-24 MED ORDER — TRANEXAMIC ACID 1000 MG/10ML IV SOLN
2000.0000 mg | INTRAVENOUS | Status: DC
  Filled 2022-10-24: qty 20

## 2022-10-24 NOTE — Anesthesia Preprocedure Evaluation (Addendum)
Anesthesia Evaluation  Patient identified by MRN, date of birth, ID band Patient awake    Reviewed: Allergy & Precautions, H&P , NPO status , Patient's Chart, lab work & pertinent test results  Airway Mallampati: III  TM Distance: >3 FB Neck ROM: Full    Dental no notable dental hx. (+) Teeth Intact, Dental Advisory Given   Pulmonary neg pulmonary ROS   Pulmonary exam normal breath sounds clear to auscultation       Cardiovascular Exercise Tolerance: Good hypertension, Pt. on medications and Pt. on home beta blockers + CAD, + Past MI, + Cardiac Stents and + Peripheral Vascular Disease  + dysrhythmias Atrial Fibrillation  Rhythm:Regular Rate:Normal     Neuro/Psych CVA, No Residual Symptoms  negative psych ROS   GI/Hepatic Neg liver ROS, hiatal hernia,,,  Endo/Other  negative endocrine ROS    Renal/GU negative Renal ROS  negative genitourinary   Musculoskeletal   Abdominal   Peds  Hematology negative hematology ROS (+)   Anesthesia Other Findings   Reproductive/Obstetrics negative OB ROS                             Anesthesia Physical Anesthesia Plan  ASA: 3  Anesthesia Plan: General   Post-op Pain Management: Tylenol PO (pre-op)*   Induction: Intravenous  PONV Risk Score and Plan: 3 and Ondansetron, Dexamethasone, Treatment may vary due to age or medical condition, Propofol infusion and TIVA  Airway Management Planned: Oral ETT  Additional Equipment:   Intra-op Plan:   Post-operative Plan: Extubation in OR  Informed Consent: I have reviewed the patients History and Physical, chart, labs and discussed the procedure including the risks, benefits and alternatives for the proposed anesthesia with the patient or authorized representative who has indicated his/her understanding and acceptance.     Dental advisory given  Plan Discussed with: CRNA  Anesthesia Plan Comments: (PAT  note by Karoline Caldwell, PA-C: 82 year old male following with oncology for suspicion of multiple myeloma who presented with multiple lytic lesions involving T10, left femoral neck as well as T4, T9 and T11 vertebral bodies.  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  He was also incidentally found to have pulmonary nodules and is being followed by Dr. Valeta Harms.  He is also noted to have a history of melanoma and prostate cancer.  Follows with cardiology for history of CAD, fibrillation on Coumadin, ischemic cardiomyopathy, VA, PAD, HTN, HLD. 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  Abnormal nuclear stress test 07/2015. Left heart catheterization 07/2015 with total occlusion of mid LAD after the first diagonal within the previously stented segment.  Well-developed apical collaterals from the dominant right coronary. Widely patent dominant RCA and circumflex arteries.  Akinesis of the apical segment with intramyocardial calcification, EF 45 to 50%.  Medical therapy recommended. 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  Last seen by Christen Bame, NP on 07/14/2022 for preop evaluation prior to undergoing vitrectomy plan for spring 2024.  Per note,  "Planning to undergo vitrectomy in Spring 2024. Is overall doing well without specific cardiac symptoms. Is postponing vitrectomy until cleared to hold anticoagulation s/p a fib ablation. According to the Revised Cardiac Risk Index (RCRI), his Perioperative Risk of Major Cardiac Event is (%): 11. His Functional Capacity in METs is: 7.59 according to the Duke Activity Status Index (DASI). He may proceed to surgery without further  cardiac testing per AHA/ACC guidelines.  Advised him to notify us if he develops any concerning cardiac symptoms prior to vitrectomy. Guidance will be provided closer to the date of surgery regarding the specifics of holding coumadin and bridging with Lovenox."  Patient was  subsequently cleared to undergo vertebral biopsy/kyphoplasty per telephone encounter by Melina Copa, PA-C on 10/04/2022 stating, "Chart reviewed as part of pre-operative protocol coverage. Given recent in-person visit for surgical clearance 07/2022 cleared for unrelated surgery at that time, I reached out to patient for routine phone call to ensure no changes from that visit before we hold anticoagulation for procedure below. Pt reports he is doing well from cardiac standpoint, still walking 2-3 miles 3x week, no CP, SOB, palpitations, or syncope. Ablation remains on hold at this time due to ongoing workup for malignancy so that he can continue to interrupt anticoagulation for proposed procedures. From medical standpoint may proceed with surgery as requested but will route to pharm to help coordinate warfarin instructions with patient and reach out to him with instructions. Once this information is known will bundle rec and route to surgeon."  Patient is currently on Lovenox bridge as prescribed by cardiology pharmacist.  Reports last dose warfarin 10/19/2022, Lovenox bridge started 10/21/2022, no Lovenox on day of surgery.  History of hiatal hernia.  BMP and CBC from 10/11/2022 reviewed, unremarkable.  EKG 07/14/2022: Atrial fibrillation.  Rate 84.  Echo 10/21/21 1. Left ventricular ejection fraction, by estimation, is 45 to 50%. The  left ventricle has mildly decreased function. The left ventricle  demonstrates regional wall motion abnormalities (see scoring  diagram/findings for description). Left ventricular  diastolic parameters are indeterminate. There is moderate hypokinesis of  the left ventricular, mid-apical inferior wall. There is of the left  ventricular, apical apical segment.  2. Right ventricular systolic function is normal. The right ventricular  size is normal. There is normal pulmonary artery systolic pressure.  3. Left atrial size was moderately dilated.  4. Right atrial size was  mild to moderately dilated.  5. The mitral valve is normal in structure. Trivial mitral valve  regurgitation. No evidence of mitral stenosis.  6. The aortic valve is tricuspid. There is mild calcification of the  aortic valve. Aortic valve regurgitation is trivial. Aortic valve  sclerosis is present, with no evidence of aortic valve stenosis.  7. The inferior vena cava is normal in size with greater than 50%  respiratory variability, suggesting right atrial pressure of 3 mmHg.   Comparison(s): Prior images unable to be directly viewed, comparison made  by report only.   Conclusion(s)/Recommendation(s): Mildly reduced LVEF with akinetic apex  and hypokinetic mid to distal inferior wall. No LV thormbus visualized  with echo contrast.  LHC 07/24/15  1. Mid LAD lesion, 99% stenosed. The lesion was previously treated with a stent (bare metal). LAD receives collaterals from the right coronary 2. Mid LAD to Dist LAD lesion, 80% stenosed.  ? Total occlusion of the mid LAD after the first diagonal within the previously stented segment. Well-developed apical collaterals from the dominant right coronary. ? Widely patent dominant RCA and circumflex coronary arteries. ? Akinesis of the apical segment with intramyocardial calcification. EF 45-50%. ? The anatomy as described above explains the abnormalities noted on the recent myocardial perfusion study. In the absence of symptoms, conservative medical management will continue.  Recommendations:  ? Continue aggressive risk factor modification ? Continue walking program ? Notify if any anginal symptoms.  )  Anesthesia Quick Evaluation  

## 2022-10-24 NOTE — Telephone Encounter (Signed)
Spoke to pt & made him aware it is for bridging needs and he already has appt with coumadin clinic.  I gave pt my direct # is case anything else is needed.

## 2022-10-24 NOTE — Telephone Encounter (Signed)
Spoke with patient's wife. She is concerned about him getting the proper Lovenox the day after surgery, and the proper amount of warfarin the evening of surgery. She wanted to know if she needed to bring his needles, etc.  I explained that the hospital should have everything he needs.   She also wanted to know if sleeping in a recliner is okay after surgery?  Lastly, she ask if she would be able to take him to have his INR checked next Monday without the assistance of another family member? She is not sure what to expect in terms of his mobility after surgery.

## 2022-10-24 NOTE — Progress Notes (Signed)
Spoke with the patient. Answered all questions from the patient at this time.

## 2022-10-24 NOTE — Telephone Encounter (Signed)
I called pt and he states he believes it was Cardiology that called him.  I looked in chart and there is a phone message dated 3/15 from cardiology discussing his warfarin and bridging with [facility].

## 2022-10-24 NOTE — Progress Notes (Signed)
Anesthesia Chart Review:  82 year old male following with oncology for suspicion of multiple myeloma who presented with multiple lytic lesions involving T10, left femoral neck as well as T4, T9 and T11 vertebral bodies.  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  He was also incidentally found to have pulmonary nodules and is being followed by Dr. Valeta Harms.  He is also noted to have a history of melanoma and prostate cancer.  Follows with cardiology for history of CAD, fibrillation on Coumadin, ischemic cardiomyopathy, VA, PAD, HTN, HLD. 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  Abnormal nuclear stress test 07/2015. Left heart catheterization 07/2015 with total occlusion of mid LAD after the first diagonal within the previously stented segment.  Well-developed apical collaterals from the dominant right coronary. Widely patent dominant RCA and circumflex arteries.  Akinesis of the apical segment with intramyocardial calcification, EF 45 to 50%.  Medical therapy recommended. 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  Last seen by Christen Bame, NP on 07/14/2022 for preop evaluation prior to undergoing vitrectomy plan for spring 2024.  Per note,  "Planning to undergo vitrectomy in Spring 2024. Is overall doing well without specific cardiac symptoms. Is postponing vitrectomy until cleared to hold anticoagulation s/p a fib ablation. According to the Revised Cardiac Risk Index (RCRI), his Perioperative Risk of Major Cardiac Event is (%): 11. His Functional Capacity in METs is: 7.59 according to the Duke Activity Status Index (DASI). He may proceed to surgery without further cardiac testing per AHA/ACC guidelines.  Advised him to notify us if he develops any concerning cardiac symptoms prior to vitrectomy. Guidance will be provided closer to the date of surgery regarding the specifics of holding coumadin and bridging with Lovenox."  Patient was  subsequently cleared to undergo vertebral biopsy/kyphoplasty per telephone encounter by Melina Copa, PA-C on 10/04/2022 stating, "Chart reviewed as part of pre-operative protocol coverage. Given recent in-person visit for surgical clearance 07/2022 cleared for unrelated surgery at that time, I reached out to patient for routine phone call to ensure no changes from that visit before we hold anticoagulation for procedure below. Pt reports he is doing well from cardiac standpoint, still walking 2-3 miles 3x week, no CP, SOB, palpitations, or syncope. Ablation remains on hold at this time due to ongoing workup for malignancy so that he can continue to interrupt anticoagulation for proposed procedures. From medical standpoint may proceed with surgery as requested but will route to pharm to help coordinate warfarin instructions with patient and reach out to him with instructions. Once this information is known will bundle rec and route to surgeon."  Patient is currently on Lovenox bridge as prescribed by cardiology pharmacist.  Reports last dose warfarin 10/19/2022, Lovenox bridge started 10/21/2022, no Lovenox on day of surgery.  History of hiatal hernia.  BMP and CBC from 10/11/2022 reviewed, unremarkable.  EKG 07/14/2022: Atrial fibrillation.  Rate 84.  Echo 10/21/21 1. Left ventricular ejection fraction, by estimation, is 45 to 50%. The  left ventricle has mildly decreased function. The left ventricle  demonstrates regional wall motion abnormalities (see scoring  diagram/findings for description). Left ventricular  diastolic parameters are indeterminate. There is moderate hypokinesis of  the left ventricular, mid-apical inferior wall. There is of the left  ventricular, apical apical segment.   2. Right ventricular systolic function is normal. The right ventricular  size is normal. There is normal pulmonary artery systolic pressure.   3.  Left atrial size was moderately dilated.   4. Right atrial size was  mild to moderately dilated.   5. The mitral valve is normal in structure. Trivial mitral valve  regurgitation. No evidence of mitral stenosis.   6. The aortic valve is tricuspid. There is mild calcification of the  aortic valve. Aortic valve regurgitation is trivial. Aortic valve  sclerosis is present, with no evidence of aortic valve stenosis.   7. The inferior vena cava is normal in size with greater than 50%  respiratory variability, suggesting right atrial pressure of 3 mmHg.   Comparison(s): Prior images unable to be directly viewed, comparison made  by report only.   Conclusion(s)/Recommendation(s): Mildly reduced LVEF with akinetic apex  and hypokinetic mid to distal inferior wall. No LV thormbus visualized  with echo contrast.   LHC 07/24/15   Mid LAD lesion, 99% stenosed. The lesion was previously treated with a stent (bare metal). LAD receives collaterals from the right coronary Mid LAD to Dist LAD lesion, 80% stenosed.   Total occlusion of the mid LAD after the first diagonal within the previously stented segment. Well-developed apical collaterals from the dominant right coronary. Widely patent dominant RCA and circumflex coronary arteries. Akinesis of the apical segment with intramyocardial calcification. EF 45-50%. The anatomy as described above explains the abnormalities noted on the recent myocardial perfusion study. In the absence of symptoms, conservative medical management will continue.   Recommendations:   Continue aggressive risk factor modification Continue walking program Notify if any anginal symptoms.   Wynonia Musty [facility] Stay [facility]/Anesthesiology Phone [phone] 10/24/2022 10:26 AM

## 2022-10-25 ENCOUNTER — Ambulatory Visit (HOSPITAL_COMMUNITY): Payer: Medicare Other | Admitting: Physician Assistant

## 2022-10-25 ENCOUNTER — Ambulatory Visit (HOSPITAL_COMMUNITY): Payer: Medicare Other

## 2022-10-25 ENCOUNTER — Encounter (HOSPITAL_COMMUNITY): Payer: Self-pay | Admitting: Orthopaedic Surgery

## 2022-10-25 ENCOUNTER — Ambulatory Visit (HOSPITAL_BASED_OUTPATIENT_CLINIC_OR_DEPARTMENT_OTHER): Payer: Medicare Other | Admitting: Anesthesiology

## 2022-10-25 ENCOUNTER — Inpatient Hospital Stay (HOSPITAL_COMMUNITY)
Admission: RE | Admit: 2022-10-25 | Discharge: 2022-10-26 | DRG: 824 | Disposition: A | Discharge status: Home Health | Payer: Medicare Other | Attending: Orthopaedic Surgery | Admitting: Orthopaedic Surgery

## 2022-10-25 ENCOUNTER — Encounter (HOSPITAL_COMMUNITY)
Admission: RE | Disposition: A | Discharge status: Home Health | Payer: Self-pay | Source: Home / Self Care | Attending: Orthopaedic Surgery

## 2022-10-25 ENCOUNTER — Other Ambulatory Visit: Payer: Self-pay

## 2022-10-25 DIAGNOSIS — I739 Peripheral vascular disease, unspecified: Secondary | ICD-10-CM | POA: Diagnosis present

## 2022-10-25 DIAGNOSIS — I252 Old myocardial infarction: Secondary | ICD-10-CM

## 2022-10-25 DIAGNOSIS — E785 Hyperlipidemia, unspecified: Secondary | ICD-10-CM | POA: Diagnosis present

## 2022-10-25 DIAGNOSIS — C7951 Secondary malignant neoplasm of bone: Secondary | ICD-10-CM | POA: Diagnosis present

## 2022-10-25 DIAGNOSIS — E559 Vitamin D deficiency, unspecified: Secondary | ICD-10-CM | POA: Diagnosis present

## 2022-10-25 DIAGNOSIS — Z8249 Family history of ischemic heart disease and other diseases of the circulatory system: Secondary | ICD-10-CM

## 2022-10-25 DIAGNOSIS — Z8673 Personal history of transient ischemic attack (TIA), and cerebral infarction without residual deficits: Secondary | ICD-10-CM | POA: Diagnosis not present

## 2022-10-25 DIAGNOSIS — C9000 Multiple myeloma not having achieved remission: Secondary | ICD-10-CM | POA: Diagnosis present

## 2022-10-25 DIAGNOSIS — I10 Essential (primary) hypertension: Secondary | ICD-10-CM | POA: Diagnosis present

## 2022-10-25 DIAGNOSIS — I4891 Unspecified atrial fibrillation: Secondary | ICD-10-CM

## 2022-10-25 DIAGNOSIS — I2510 Atherosclerotic heart disease of native coronary artery without angina pectoris: Secondary | ICD-10-CM | POA: Diagnosis present

## 2022-10-25 DIAGNOSIS — C419 Malignant neoplasm of bone and articular cartilage, unspecified: Secondary | ICD-10-CM

## 2022-10-25 DIAGNOSIS — Z955 Presence of coronary angioplasty implant and graft: Secondary | ICD-10-CM

## 2022-10-25 DIAGNOSIS — M84452A Pathological fracture, left femur, initial encounter for fracture: Secondary | ICD-10-CM

## 2022-10-25 DIAGNOSIS — C799 Secondary malignant neoplasm of unspecified site: Secondary | ICD-10-CM

## 2022-10-25 DIAGNOSIS — I255 Ischemic cardiomyopathy: Secondary | ICD-10-CM | POA: Diagnosis present

## 2022-10-25 DIAGNOSIS — M25552 Pain in left hip: Secondary | ICD-10-CM | POA: Diagnosis present

## 2022-10-25 DIAGNOSIS — Z9189 Other specified personal risk factors, not elsewhere classified: Secondary | ICD-10-CM

## 2022-10-25 DIAGNOSIS — Z79899 Other long term (current) drug therapy: Secondary | ICD-10-CM | POA: Diagnosis not present

## 2022-10-25 DIAGNOSIS — Z823 Family history of stroke: Secondary | ICD-10-CM

## 2022-10-25 DIAGNOSIS — R7303 Prediabetes: Secondary | ICD-10-CM | POA: Diagnosis present

## 2022-10-25 DIAGNOSIS — Z7901 Long term (current) use of anticoagulants: Secondary | ICD-10-CM

## 2022-10-25 DIAGNOSIS — Z8546 Personal history of malignant neoplasm of prostate: Secondary | ICD-10-CM | POA: Diagnosis not present

## 2022-10-25 HISTORY — PX: INTRAMEDULLARY (IM) NAIL INTERTROCHANTERIC: SHX5875

## 2022-10-25 LAB — BASIC METABOLIC PANEL
Anion gap: 7 (ref 5–15)
BUN: 19 mg/dL (ref 8–23)
CO2: 25 mmol/L (ref 22–32)
Calcium: 9.1 mg/dL (ref 8.9–10.3)
Chloride: 102 mmol/L (ref 98–111)
Creatinine, Ser: 1.12 mg/dL (ref 0.61–1.24)
GFR, Estimated: >60 mL/min (ref >60)
Glucose, Bld: 170 mg/dL — ABNORMAL HIGH (ref 70–99)
Potassium: 3.5 mmol/L (ref 3.5–5.1)
Sodium: 134 mmol/L — ABNORMAL LOW (ref 135–145)

## 2022-10-25 LAB — CBC
HCT: 44.1 % (ref 39.0–52.0)
Hemoglobin: 15.5 g/dL (ref 13.0–17.0)
MCH: 34 pg (ref 26.0–34.0)
MCHC: 35.1 g/dL (ref 30.0–36.0)
MCV: 96.7 fL (ref 80.0–100.0)
Platelets: 152 10*3/uL (ref 150–400)
RBC: 4.56 MIL/uL (ref 4.22–5.81)
RDW: 13.4 % (ref 11.5–15.5)
WBC: 9 10*3/uL (ref 4.0–10.5)
nRBC: 0 % (ref 0.0–0.2)

## 2022-10-25 SURGERY — FIXATION, FRACTURE, INTERTROCHANTERIC, WITH INTRAMEDULLARY ROD
Anesthesia: General | Laterality: Left

## 2022-10-25 MED ORDER — HYDROCHLOROTHIAZIDE 12.5 MG PO TABS
12.5000 mg | ORAL_TABLET | Freq: Every day | ORAL | Status: DC
  Administered 2022-10-25 – 2022-10-26 (×2): 12.5 mg via ORAL
  Filled 2022-10-25 (×2): qty 1

## 2022-10-25 MED ORDER — METOCLOPRAMIDE HCL 5 MG/ML IJ SOLN: 5.0000 mg | Freq: Three times a day (TID) | INTRAMUSCULAR | Status: DC | PRN

## 2022-10-25 MED ORDER — ENOXAPARIN SODIUM 80 MG/0.8ML IJ SOSY
80.0000 mg | PREFILLED_SYRINGE | Freq: Two times a day (BID) | INTRAMUSCULAR | Status: DC
  Administered 2022-10-26: 80 mg via SUBCUTANEOUS
  Filled 2022-10-25: qty 0.8

## 2022-10-25 MED ORDER — LACTATED RINGERS IV SOLN: INTRAVENOUS | Status: DC

## 2022-10-25 MED ORDER — CEFAZOLIN SODIUM-DEXTROSE 2-4 GM/100ML-% IV SOLN
INTRAVENOUS | Status: AC
  Filled 2022-10-25: qty 100

## 2022-10-25 MED ORDER — METOCLOPRAMIDE HCL 5 MG PO TABS: 5.0000 mg | ORAL_TABLET | Freq: Three times a day (TID) | ORAL | Status: DC | PRN

## 2022-10-25 MED ORDER — DIPHENHYDRAMINE HCL 12.5 MG/5ML PO ELIX: 25.0000 mg | ORAL_SOLUTION | ORAL | Status: DC | PRN

## 2022-10-25 MED ORDER — ACETAMINOPHEN 500 MG PO TABS
1000.0000 mg | ORAL_TABLET | Freq: Four times a day (QID) | ORAL | Status: AC
  Administered 2022-10-25 – 2022-10-26 (×4): 1000 mg via ORAL
  Filled 2022-10-25 (×4): qty 2

## 2022-10-25 MED ORDER — ONDANSETRON HCL 4 MG/2ML IJ SOLN
INTRAMUSCULAR | Status: DC | PRN
  Administered 2022-10-25: 4 mg via INTRAVENOUS

## 2022-10-25 MED ORDER — 0.9 % SODIUM CHLORIDE (POUR BTL) OPTIME
TOPICAL | Status: DC | PRN
  Administered 2022-10-25: 1000 mL

## 2022-10-25 MED ORDER — FENTANYL CITRATE (PF) 250 MCG/5ML IJ SOLN
INTRAMUSCULAR | Status: AC
  Filled 2022-10-25: qty 5

## 2022-10-25 MED ORDER — SODIUM CHLORIDE 0.9 % IV SOLN: INTRAVENOUS | Status: DC

## 2022-10-25 MED ORDER — SUGAMMADEX SODIUM 200 MG/2ML IV SOLN
INTRAVENOUS | Status: DC | PRN
  Administered 2022-10-25: 160 mg via INTRAVENOUS

## 2022-10-25 MED ORDER — MAGNESIUM CITRATE PO SOLN: 1.0000 | Freq: Once | ORAL | Status: DC | PRN

## 2022-10-25 MED ORDER — HYDROMORPHONE HCL 1 MG/ML IJ SOLN
INTRAMUSCULAR | Status: AC
  Filled 2022-10-25: qty 1

## 2022-10-25 MED ORDER — BENAZEPRIL HCL 20 MG PO TABS
20.0000 mg | ORAL_TABLET | Freq: Every day | ORAL | Status: DC
  Administered 2022-10-25 – 2022-10-26 (×2): 20 mg via ORAL
  Filled 2022-10-25 (×2): qty 1

## 2022-10-25 MED ORDER — ACETAMINOPHEN 325 MG PO TABS: 325.0000 mg | ORAL_TABLET | Freq: Four times a day (QID) | ORAL | Status: DC | PRN

## 2022-10-25 MED ORDER — ROCURONIUM BROMIDE 10 MG/ML (PF) SYRINGE
PREFILLED_SYRINGE | INTRAVENOUS | Status: AC
  Filled 2022-10-25: qty 10

## 2022-10-25 MED ORDER — ONDANSETRON HCL 4 MG/2ML IJ SOLN
INTRAMUSCULAR | Status: AC
  Filled 2022-10-25: qty 2

## 2022-10-25 MED ORDER — ORAL CARE MOUTH RINSE: 15.0000 mL | Freq: Once | OROMUCOSAL | Status: AC

## 2022-10-25 MED ORDER — ACETAMINOPHEN 500 MG PO TABS: 1000.0000 mg | ORAL_TABLET | Freq: Once | ORAL | Status: AC

## 2022-10-25 MED ORDER — TRANEXAMIC ACID-NACL 1000-0.7 MG/100ML-% IV SOLN
1000.0000 mg | Freq: Once | INTRAVENOUS | Status: AC
  Administered 2022-10-25: 1000 mg via INTRAVENOUS
  Filled 2022-10-25: qty 100

## 2022-10-25 MED ORDER — HYDROMORPHONE HCL 1 MG/ML IJ SOLN: 0.5000 mg | INTRAMUSCULAR | Status: DC | PRN

## 2022-10-25 MED ORDER — AMLODIPINE BESYLATE 5 MG PO TABS
5.0000 mg | ORAL_TABLET | Freq: Every day | ORAL | Status: DC
  Administered 2022-10-26: 5 mg via ORAL
  Filled 2022-10-25: qty 1

## 2022-10-25 MED ORDER — CEFAZOLIN SODIUM-DEXTROSE 2-4 GM/100ML-% IV SOLN
2.0000 g | Freq: Four times a day (QID) | INTRAVENOUS | Status: AC
  Administered 2022-10-25 – 2022-10-26 (×3): 2 g via INTRAVENOUS
  Filled 2022-10-25 (×3): qty 100

## 2022-10-25 MED ORDER — POLYETHYLENE GLYCOL 3350 17 G PO PACK: 17.0000 g | PACK | Freq: Every day | ORAL | Status: DC | PRN

## 2022-10-25 MED ORDER — METOPROLOL SUCCINATE ER 25 MG PO TB24
25.0000 mg | ORAL_TABLET | Freq: Every day | ORAL | Status: DC
  Administered 2022-10-26: 25 mg via ORAL
  Filled 2022-10-25: qty 1

## 2022-10-25 MED ORDER — HYDROMORPHONE HCL 1 MG/ML IJ SOLN
0.2500 mg | INTRAMUSCULAR | Status: DC | PRN
  Administered 2022-10-25 (×4): 0.5 mg via INTRAVENOUS

## 2022-10-25 MED ORDER — CEFAZOLIN SODIUM-DEXTROSE 2-4 GM/100ML-% IV SOLN
2.0000 g | INTRAVENOUS | Status: AC
  Administered 2022-10-25: 2 g via INTRAVENOUS

## 2022-10-25 MED ORDER — LIDOCAINE 2% (20 MG/ML) 5 ML SYRINGE
INTRAMUSCULAR | Status: AC
  Filled 2022-10-25: qty 5

## 2022-10-25 MED ORDER — ONDANSETRON HCL 4 MG PO TABS: 4.0000 mg | ORAL_TABLET | Freq: Four times a day (QID) | ORAL | Status: DC | PRN

## 2022-10-25 MED ORDER — CHLORHEXIDINE GLUCONATE 0.12 % MT SOLN
15.0000 mL | Freq: Once | OROMUCOSAL | Status: AC
  Administered 2022-10-25: 15 mL via OROMUCOSAL

## 2022-10-25 MED ORDER — FENTANYL CITRATE (PF) 250 MCG/5ML IJ SOLN
INTRAMUSCULAR | Status: DC | PRN
  Administered 2022-10-25: 100 ug via INTRAVENOUS

## 2022-10-25 MED ORDER — PROPOFOL 10 MG/ML IV BOLUS
INTRAVENOUS | Status: AC
  Filled 2022-10-25: qty 20

## 2022-10-25 MED ORDER — METHOCARBAMOL 500 MG PO TABS
500.0000 mg | ORAL_TABLET | Freq: Four times a day (QID) | ORAL | Status: DC | PRN
  Administered 2022-10-25 – 2022-10-26 (×2): 500 mg via ORAL
  Filled 2022-10-25 (×2): qty 1

## 2022-10-25 MED ORDER — PHENYLEPHRINE HCL (PRESSORS) 10 MG/ML IV SOLN
INTRAVENOUS | Status: DC | PRN
  Administered 2022-10-25: 80 ug via INTRAVENOUS

## 2022-10-25 MED ORDER — DOCUSATE SODIUM 100 MG PO CAPS
100.0000 mg | ORAL_CAPSULE | Freq: Two times a day (BID) | ORAL | Status: DC
  Administered 2022-10-25 – 2022-10-26 (×3): 100 mg via ORAL
  Filled 2022-10-25 (×3): qty 1

## 2022-10-25 MED ORDER — PHENYLEPHRINE HCL-NACL 20-0.9 MG/250ML-% IV SOLN
INTRAVENOUS | Status: DC | PRN
  Administered 2022-10-25: 75 ug/min via INTRAVENOUS

## 2022-10-25 MED ORDER — METHOCARBAMOL 1000 MG/10ML IJ SOLN: 500.0000 mg | Freq: Four times a day (QID) | INTRAVENOUS | Status: DC | PRN

## 2022-10-25 MED ORDER — ONDANSETRON HCL 4 MG/2ML IJ SOLN: 4.0000 mg | Freq: Four times a day (QID) | INTRAMUSCULAR | Status: DC | PRN

## 2022-10-25 MED ORDER — TRANEXAMIC ACID-NACL 1000-0.7 MG/100ML-% IV SOLN
INTRAVENOUS | Status: AC
  Filled 2022-10-25: qty 100

## 2022-10-25 MED ORDER — OXYCODONE HCL 5 MG PO TABS
5.0000 mg | ORAL_TABLET | ORAL | Status: DC | PRN
  Administered 2022-10-25 – 2022-10-26 (×5): 10 mg via ORAL
  Filled 2022-10-25 (×5): qty 2

## 2022-10-25 MED ORDER — PROPOFOL 500 MG/50ML IV EMUL
INTRAVENOUS | Status: DC | PRN
  Administered 2022-10-25: 100 ug/kg/min via INTRAVENOUS

## 2022-10-25 MED ORDER — PROPOFOL 10 MG/ML IV BOLUS
INTRAVENOUS | Status: DC | PRN
  Administered 2022-10-25: 50 mg via INTRAVENOUS
  Administered 2022-10-25: 100 mg via INTRAVENOUS

## 2022-10-25 MED ORDER — OXYCODONE HCL 5 MG PO TABS: 10.0000 mg | ORAL_TABLET | ORAL | Status: DC | PRN

## 2022-10-25 MED ORDER — ROCURONIUM BROMIDE 10 MG/ML (PF) SYRINGE
PREFILLED_SYRINGE | INTRAVENOUS | Status: DC | PRN
  Administered 2022-10-25: 40 mg via INTRAVENOUS

## 2022-10-25 MED ORDER — BENAZEPRIL-HYDROCHLOROTHIAZIDE 20-12.5 MG PO TABS: 1.0000 | ORAL_TABLET | Freq: Every day | ORAL | Status: DC

## 2022-10-25 MED ORDER — ACETAMINOPHEN 500 MG PO TABS
ORAL_TABLET | ORAL | Status: AC
  Administered 2022-10-25: 1000 mg via ORAL
  Filled 2022-10-25: qty 2

## 2022-10-25 MED ORDER — TRANEXAMIC ACID-NACL 1000-0.7 MG/100ML-% IV SOLN
1000.0000 mg | INTRAVENOUS | Status: AC
  Administered 2022-10-25: 1000 mg via INTRAVENOUS

## 2022-10-25 MED ORDER — SORBITOL 70 % SOLN: 30.0000 mL | Freq: Every day | Status: DC | PRN

## 2022-10-25 SURGICAL SUPPLY — 50 items
BAG COUNTER SPONGE SURGICOUNT (BAG) ×1 IMPLANT
BAG SPNG CNTER NS LX DISP (BAG) ×1
BIT DRILL INTERTAN LAG SCREW (BIT) IMPLANT
BNDG CMPR 5X6 CHSV STRCH STRL (GAUZE/BANDAGES/DRESSINGS)
BNDG COHESIVE 4X5 TAN STRL (GAUZE/BANDAGES/DRESSINGS) ×1 IMPLANT
BNDG COHESIVE 6X5 TAN ST LF (GAUZE/BANDAGES/DRESSINGS) IMPLANT
BNDG GAUZE DERMACEA FLUFF 4 (GAUZE/BANDAGES/DRESSINGS) ×1 IMPLANT
BNDG GZE DERMACEA 4 6PLY (GAUZE/BANDAGES/DRESSINGS) ×1
COVER PERINEAL POST (MISCELLANEOUS) ×1 IMPLANT
COVER SURGICAL LIGHT HANDLE (MISCELLANEOUS) ×1 IMPLANT
DRAPE C-ARMOR (DRAPES) ×1 IMPLANT
DRAPE STERI IOBAN 125X83 (DRAPES) ×1 IMPLANT
DRESSING MEPILEX FLEX 4X4 (GAUZE/BANDAGES/DRESSINGS) ×1 IMPLANT
DRSG MEPILEX FLEX 4X4 (GAUZE/BANDAGES/DRESSINGS) ×2
DRSG MEPILEX POST OP 4X8 (GAUZE/BANDAGES/DRESSINGS) ×1 IMPLANT
DURAPREP 26ML APPLICATOR (WOUND CARE) ×1 IMPLANT
ELECT REM PT RETURN 9FT ADLT (ELECTROSURGICAL) ×1
ELECTRODE REM PT RTRN 9FT ADLT (ELECTROSURGICAL) ×1 IMPLANT
GAUZE PAD ABD 8X10 STRL (GAUZE/BANDAGES/DRESSINGS) ×2 IMPLANT
GLOVE BIOGEL PI IND STRL 7.0 (GLOVE) ×2 IMPLANT
GLOVE BIOGEL PI IND STRL 7.5 (GLOVE) ×1 IMPLANT
GLOVE ECLIPSE 7.0 STRL STRAW (GLOVE) ×1 IMPLANT
GLOVE SKINSENSE STRL SZ7.5 (GLOVE) ×2 IMPLANT
GLOVE SURG SYN 7.5  E (GLOVE) ×2
GLOVE SURG SYN 7.5 E (GLOVE) ×2 IMPLANT
GLOVE SURG SYN 7.5 PF PI (GLOVE) ×2 IMPLANT
GLOVE SURG UNDER POLY LF SZ7 (GLOVE) ×19 IMPLANT
GLOVE SURG UNDER POLY LF SZ7.5 (GLOVE) ×4 IMPLANT
GOWN STRL SURGICAL XL XLNG (GOWN DISPOSABLE) ×1 IMPLANT
GUIDE PIN 3.2X343 (PIN) ×2
GUIDE PIN 3.2X343MM (PIN) ×2
KIT BASIN OR (CUSTOM PROCEDURE TRAY) ×1 IMPLANT
KIT TURNOVER KIT B (KITS) ×1 IMPLANT
MANIFOLD NEPTUNE II (INSTRUMENTS) ×1 IMPLANT
NAIL TRIGEN 10MMX40CM-125 LEFT (Nail) IMPLANT
NS IRRIG 1000ML POUR BTL (IV SOLUTION) ×1 IMPLANT
PACK GENERAL/GYN (CUSTOM PROCEDURE TRAY) ×1 IMPLANT
PAD ARMBOARD 7.5X6 YLW CONV (MISCELLANEOUS) ×2 IMPLANT
PAD CAST 4YDX4 CTTN HI CHSV (CAST SUPPLIES) ×2 IMPLANT
PADDING CAST COTTON 4X4 STRL (CAST SUPPLIES) ×2
PIN GUIDE 3.2X343MM (PIN) IMPLANT
SCREW LAG COMPR KIT 100/95 (Screw) IMPLANT
STAPLER VISISTAT 35W (STAPLE) ×1 IMPLANT
SUT VIC AB 0 CT1 27 (SUTURE) ×1
SUT VIC AB 0 CT1 27XBRD ANBCTR (SUTURE) ×1 IMPLANT
SUT VIC AB 2-0 CT1 27 (SUTURE) ×1
SUT VIC AB 2-0 CT1 TAPERPNT 27 (SUTURE) ×1 IMPLANT
TOWEL GREEN STERILE (TOWEL DISPOSABLE) ×1 IMPLANT
TOWEL GREEN STERILE FF (TOWEL DISPOSABLE) ×1 IMPLANT
WATER STERILE IRR 1000ML POUR (IV SOLUTION) ×1 IMPLANT

## 2022-10-25 NOTE — Anesthesia Procedure Notes (Signed)
Procedure Name: Intubation Date/Time: 10/25/2022 7:48 AM  Performed by: Maude Leriche, CRNAPre-anesthesia Checklist: Patient identified, Emergency Drugs available, Suction available and Patient being monitored Patient Re-evaluated:Patient Re-evaluated prior to induction Oxygen Delivery Method: Circle system utilized Preoxygenation: Pre-oxygenation with 100% oxygen Induction Type: IV induction Ventilation: Mask ventilation without difficulty Laryngoscope Size: Miller and 3 Grade View: Grade II Tube type: Oral Tube size: 7.5 mm Number of attempts: 1 Airway Equipment and Method: Stylet and Bite block Placement Confirmation: ETT inserted through vocal cords under direct vision, positive ETCO2 and breath sounds checked- equal and bilateral Secured at: 23 cm Tube secured with: Tape Dental Injury: Teeth and Oropharynx as per pre-operative assessment

## 2022-10-25 NOTE — Op Note (Signed)
   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  The risks and benefits of the procedure discussed with the patient prior to the procedure and all questions were answered; consent was obtained.  PREOPERATIVE DIAGNOSIS: Impending pathologic fracture of left basicervical femoral neck and intertrochanteric region.  POSTOPERATIVE DIAGNOSIS: Same   PROCEDURE: Cephallomedullary stabilization of impending pathologic fracture of left femoral neck/intertrochanteric region. CPT [id number]   SURGEON: N. Eduard Roux, M.D.   ASSIST: Madalyn Rob, [place]  ANESTHESIA: general   IV FLUIDS AND URINE: See anesthesia record   ESTIMATED BLOOD LOSS: 150 cc  IMPLANTS: [employer] InterTAN 10 x 40, 100/95 lag screws  DRAINS: None.   COMPLICATIONS: see description of procedure.   SPECIMENS: Canal reamings  DESCRIPTION OF PROCEDURE: The patient was brought to the operating room and placed supine on the operating table. The patient's leg had been signed prior to the procedure. The patient had the anesthesia placed by the anesthesiologist. The prep verification and incision time-outs were performed to confirm that this was the correct patient, site, side and location. The patient had an SCD on the opposite lower extremity. The patient did receive antibiotics prior to the incision and was re-dosed during the procedure as needed at indicated intervals. The patient was positioned on the fracture table with the table in traction and internal rotation. The well leg was placed in a scissor position and all bony prominences were well-padded. The patient had the lower extremity prepped and draped in the standard surgical fashion. The incision was made 4 finger breadths superior to the greater trochanter. A guide pin was inserted into the tip of the greater trochanter under fluoroscopic guidance. An opening  reamer was used to gain access to the femoral canal. The nail length was measured and inserted down the femoral canal to its proper depth. The appropriate version of insertion for the lag screw was found under fluoroscopy. A pin was inserted up the femoral neck through the jig to the proper depth.  Reamers were advanced up the femoral neck to their proper depths and then removed.  Reamings were sent for path. Then, a second antirotation pin was inserted inferior to the first pin. The length of the lag screw was then measured. The lag screw was inserted as near to center-center in the head as possible. The antirotation pin was then taken out and an interdigitating compression screw was placed in its place.  The wound was copiously irrigated with saline and the subcutaneous layer closed with 2.0 vicryl and the skin was reapproximated with staples. The wounds were cleaned and dried a final time and a sterile dressing was placed. The hip was taken through a range of motion at the end of the case under fluoroscopic imaging to visualize the approach-withdraw phenomenon and confirm implant length in the head. The patient was then awakened from anesthesia and taken to the recovery room in stable condition. All counts were correct at the end of the case.   Ronald Duran was necessary for opening, closing, retracting, limb positioning and overall facilitation and completion of the surgery.  POSTOPERATIVE PLAN: The patient will be weight bearing as tolerated and will return in 2 weeks for staple removal and the patient will receive DVT prophylaxis based on other medications, activity level, and risk ratio of bleeding to thrombosis.   Ronald Cecil, MD [facility] 8:31 AM

## 2022-10-25 NOTE — Evaluation (Signed)
Physical Therapy Evaluation  Patient Details Name: Ronald Duran MRN: [id number] DOB: 1941-01-03 Today's Date: 10/25/2022  History of Present Illness  Pt is a 82 y/o male who presents 10/25/2022 s/p prophylactic fixation of L femoral neck 2 lytic lesion. PMH significant for cardiomyopathy, HTN, MI, PAD, prostate CA, CVA, hernia s/p repair.   Clinical Impression  This patient presents with acute pain and decreased functional independence following the above mentioned procedure. At the time of PT eval, pt able to perform transfers and ambulation with gross min guard assist to min assist and RW for support. This patient is appropriate for skilled PT interventions to address functional limitations, improve safety and independence with functional mobility, and return to PLOF.        Recommendations for follow up therapy are one component of a multi-disciplinary discharge planning process, led by the attending physician.  Recommendations may be updated based on patient status, additional functional criteria and insurance authorization.  Follow Up Recommendations Home health PT      Assistance Recommended at Discharge Frequent or constant Supervision/Assistance  Patient can return home with the following  A little help with walking and/or transfers;A little help with bathing/dressing/bathroom;Assistance with cooking/housework;Assist for transportation;Help with stairs or ramp for entrance    Equipment Recommendations None recommended by PT  Recommendations for Other Services       Functional Status Assessment Patient has had a recent decline in their functional status and demonstrates the ability to make significant improvements in function in a reasonable and predictable amount of time.     Precautions / Restrictions Precautions Precautions: Fall Restrictions Weight Bearing Restrictions: Yes LLE Weight Bearing: Weight bearing as tolerated      Mobility  Bed Mobility Overal bed  mobility: Needs Assistance Bed Mobility: Supine to Sit     Supine to sit: Min assist     General bed mobility comments: Increased time. Assist to advance LLE towards EOB.    Transfers Overall transfer level: Needs assistance Equipment used: Rolling walker (2 wheels) Transfers: Sit to/from Stand Sit to Stand: Min assist           General transfer comment: Assist for power up to full stand and to gain/maintain standing balance. VC's for hand placement on seated surface for safety.    Ambulation/Gait Ambulation/Gait assistance: Min guard Gait Distance (Feet): 100 Feet Assistive device: Rolling walker (2 wheels) Gait Pattern/deviations: Step-through pattern, Decreased stride length, Trunk flexed Gait velocity: Decreased Gait velocity interpretation: 1.31 - 2.62 ft/sec, indicative of limited community ambulator   General Gait Details: VC's for sequencing and general safety with the RW for support. Pt cued specifically for increased heel strike, step-through gait pattern, and smoother walker progression.  Stairs            Wheelchair Mobility    Modified Rankin (Stroke Patients Only)       Balance                                             Pertinent Vitals/Pain Pain Assessment Pain Assessment: Faces Faces Pain Scale: Hurts even more Pain Location: L hip Pain Descriptors / Indicators: Operative site guarding, Discomfort Pain Intervention(s): Limited activity within patient's tolerance, Monitored during session, Repositioned    Home Living Family/patient expects to be discharged to:: Private residence Living Arrangements: Spouse/significant other Available Help at Discharge: Family;Available 24 hours/day Type of Home:  House Home Access: Stairs to enter Entrance Stairs-Rails: Right;Left;Can reach both Entrance Stairs-Number of Steps: 2   Home Layout: One level Home Equipment: Conservation officer, nature (2 wheels);Toilet riser      Prior Function  Prior Level of Function : Independent/Modified Independent             Mobility Comments: Active       Hand Dominance        Extremity/Trunk Assessment   Upper Extremity Assessment Upper Extremity Assessment: Overall WFL for tasks assessed    Lower Extremity Assessment Lower Extremity Assessment: LLE deficits/detail LLE Deficits / Details: Decreased strength and AROM consistent with pre-op diagnosis    Cervical / Trunk Assessment Cervical / Trunk Assessment: Other exceptions Cervical / Trunk Exceptions: Forward head posture with rounded shoulders  Communication      Cognition Arousal/Alertness: Awake/alert Behavior During Therapy: WFL for tasks assessed/performed Overall Cognitive Status: Within Functional Limits for tasks assessed                                          General Comments      Exercises General Exercises - Lower Extremity Long Arc Quad: 5 reps   Assessment/Plan    PT Assessment Patient needs continued PT services  PT Problem List Decreased strength;Decreased activity tolerance;Decreased range of motion;Decreased mobility;Decreased balance;Decreased knowledge of use of DME;Decreased safety awareness;Decreased knowledge of precautions;Pain       PT Treatment Interventions DME instruction;Gait training;Stair training;Functional mobility training;Therapeutic activities;Therapeutic exercise;Balance training;Patient/family education    PT Goals (Current goals can be found in the Care Plan section)  Acute Rehab PT Goals Patient Stated Goal: Home tomorrow PT Goal Formulation: With patient/family Time For Goal Achievement: 11/01/22 Potential to Achieve Goals: Good    Frequency Min 5X/week     Co-evaluation               AM-PAC PT "6 Clicks" Mobility  Outcome Measure Help needed turning from your back to your side while in a flat bed without using bedrails?: A Little Help needed moving from lying on your back to  sitting on the side of a flat bed without using bedrails?: A Little Help needed moving to and from a bed to a chair (including a wheelchair)?: A Little Help needed standing up from a chair using your arms (e.g., wheelchair or bedside chair)?: A Little Help needed to walk in hospital room?: A Little Help needed climbing 3-5 steps with a railing? : A Lot 6 Click Score: 17    End of Session Equipment Utilized During Treatment: Gait belt Activity Tolerance: Patient tolerated treatment well Patient left: in chair;with call bell/phone within reach;with family/visitor present Nurse Communication: Mobility status PT Visit Diagnosis: Unsteadiness on feet (R26.81);Pain Pain - Right/Left: Left Pain - part of body: Leg;Hip    Time: [id number] PT Time Calculation (min) (ACUTE ONLY): 39 min   Charges:   PT Evaluation $PT Eval Moderate Complexity: 1 Mod PT Treatments $Gait Training: 8-22 mins $Therapeutic Activity: 8-22 mins        Rolinda Roan, PT, DPT Acute Rehabilitation Services Secure Chat Preferred Office: [phone]   Thelma Comp 10/25/2022, 3:50 PM

## 2022-10-25 NOTE — H&P (Signed)
PREOPERATIVE H&P  Chief Complaint: lmpending left hip fracture  HPI: Ronald Duran is a 82 y.o. male who presents for surgical treatment of lmpending left hip fracture.  He denies any changes in medical history.  Past Medical History:  Diagnosis Date   Atrial fibrillation ([place])    CAD (coronary artery disease)    a. s/p MI with LAD stent placement December, 1999 -last stress test November 2008 with no EKG evidence of ischemia  b. cath 07/23/2015 after abnormal ETT and nuc, occluded mid LAD stent with good distal collateral, medical therapy    Cardiomyopathy ([place])    Ischemic cardiomyopathy with EF 40% by echo 2005   Dyslipidemia    Erectile dysfunction    Hiatal hernia    with esophageal strictures and dilatations in the past - Dr. Delfin Edis   History of nuclear stress test    Myoview 12/16: EF 47%, anterior, anteroseptal and inferoseptal defect suggesting infarct with peri-infarct ischemia, intermediate risk   HTN (hypertension)    Hx of seasonal allergies    Hyperlipidemia    Myocardial infarction ([place]) 08/01/1998   PAD (peripheral artery disease) ([place])    Pre-diabetes    Prostate cancer ([place])    with seed implant therapy following Lupron therapy in 2008 and 2009 - followed by Dr. Christinia Gully    Stroke ([place]) 04/20/2017   Vitamin D deficiency    Past Surgical History:  Procedure Laterality Date   CARDIAC CATHETERIZATION N/A 07/23/2015   Procedure: Left Heart Cath and Coronary Angiography;  Surgeon: Belva Crome, MD;  Location: [facility];  Service: Cardiovascular;  Laterality: N/A;   CARDIOVERSION N/A 06/03/2022   Procedure: CARDIOVERSION;  Surgeon: Fay Records, MD;  Location: [facility];  Service: Cardiovascular;  Laterality: N/A;   COLONOSCOPY WITH PROPOFOL N/A 11/24/2015   Procedure: COLONOSCOPY WITH PROPOFOL;  Surgeon: Garlan Fair, MD;  Location: [facility];  Service: Endoscopy;  Laterality: N/A;   esophageal strictures with dilatations  in the past per Dr. Delfin Edis     last '98   HERNIA REPAIR Left    IR BONE TUMOR(S)RF ABLATION  10/11/2022   IR KYPHO THORACIC WITH BONE BIOPSY  10/11/2022   LAD stent placement - Dr. Daneen Schick     left inguinal hernia repair - Dr. Magdalene River     prostatic radioactive seed implantation - Dr. Valere Dross     '09   Social History   Socioeconomic History   Marital status: Married    Spouse name: ,Enid Derry   Number of children: 4   Years of education: Not on file   Highest education level: Not on file  Occupational History   Not on file  Tobacco Use   Smoking status: Never   Smokeless tobacco: Never  Vaping Use   Vaping Use: Never used  Substance and Sexual Activity   Alcohol use: Not Currently   Drug use: No   Sexual activity: Not Currently  Other Topics Concern   Not on file  Social History Narrative   Not on file   Social Determinants of Health   Financial Resource Strain: Not on file  Food Insecurity: No Food Insecurity (10/12/2022)   Hunger Vital Sign    Worried About Running Out of Food in the Last Year: Never true    Ran Out of Food in the Last Year: Never true  Transportation Needs: No Transportation Needs (10/12/2022)   [place] - Transportation  Lack of Transportation (Medical): No    Lack of Transportation (Non-Medical): No  Physical Activity: Not on file  Stress: Not on file  Social Connections: Not on file   Family History  Problem Relation Age of Onset   CVA Mother    CVA Father 78   CAD Sister    No Known Allergies Prior to Admission medications   Medication Sig Start Date End Date Taking? Authorizing Provider  amLODipine (NORVASC) 5 MG tablet Take 1 tablet (5 mg total) by mouth daily. 11/01/21  Yes Belva Crome, MD  atorvastatin (LIPITOR) 40 MG tablet Take 40 mg by mouth at bedtime. 04/22/17  Yes [provider]  B Complex Vitamins (B COMPLEX 100) tablet Take 1 tablet by mouth daily. Balance   Yes [provider]   benazepril-hydrochlorthiazide (LOTENSIN HCT) 20-12.5 MG tablet Take 1 tablet by mouth daily. 11/29/21  Yes Belva Crome, MD  cetirizine (ZYRTEC) 10 MG tablet Take 10 mg by mouth daily.   Yes [provider]  Cholecalciferol (VITAMIN D3) 2000 UNITS TABS Take 2,000 Units by mouth daily.   Yes [provider]  Coenzyme Q10 (CO Q-10) 100 MG CAPS Take 100 mg by mouth daily with supper.   Yes [provider]  enoxaparin (LOVENOX) 80 MG/0.8ML injection Inject 0.8 mLs (80 mg total) into the skin every 12 (twelve) hours. 10/19/22  Yes Mealor, Yetta Barre, MD  metoprolol succinate (TOPROL XL) 25 MG 24 hr tablet Take 1 tablet (25 mg total) by mouth daily. 09/20/22  Yes Swinyer, Lanice Schwab, NP  Multiple Vitamins-Minerals (CENTRUM) tablet Take 1 tablet by mouth daily.   Yes [provider]  nitroGLYCERIN (NITROSTAT) 0.4 MG SL tablet PLACE 1 TABLET UNDER THE TONGUE IF NEEDED EVERY 5 MINUTES FOR CHEST PAIN FOR 3 DOSES IF NO RELIEF AFTER FIRST DOSE CALL 911. 01/26/22  Yes Belva Crome, MD  omeprazole (PRILOSEC) 20 MG capsule Take 20 mg by mouth daily before breakfast. 06/28/22  Yes [provider]  warfarin (COUMADIN) 5 MG tablet TAKE 1/2 TO 1 TABLET BY MOUTH DAILY AS DIRECTED BY COUMADIN CLINIC Patient taking differently: Take 2.5-5 mg by mouth See admin instructions. Take 2.5 mg by mouth on Monday, Wednesday and Friday and take 5 mg on Tuesday, Thursday, Saturday and Sunday 09/21/22  Yes Skains, Thana Farr, MD     Positive ROS: All other systems have been reviewed and were otherwise negative with the exception of those mentioned in the HPI and as above.  Physical Exam: General: Alert, no acute distress Cardiovascular: No pedal edema Respiratory: No cyanosis, no use of accessory musculature GI: abdomen soft Skin: No lesions in the area of chief complaint Neurologic: Sensation intact distally Psychiatric: Patient is competent for consent with normal mood and  affect Lymphatic: no lymphedema  MUSCULOSKELETAL: exam stable  Assessment: lmpending left hip fracture  Plan: Plan for Procedure(s): LEFT INTRAMEDULLARY (IM) NAIL INTERTROCHANTERIC  The risks benefits and alternatives were discussed with the patient including but not limited to the risks of nonoperative treatment, versus surgical intervention including infection, bleeding, nerve injury,  blood clots, cardiopulmonary complications, morbidity, mortality, among others, and they were willing to proceed.   Eduard Roux, MD 10/25/2022 6:44 AM

## 2022-10-25 NOTE — Anesthesia Postprocedure Evaluation (Signed)
Anesthesia Post Note  Patient: Ronald Duran  Procedure(s) Performed: LEFT INTRAMEDULLARY (IM) NAIL INTERTROCHANTERIC (Left)     Patient location during evaluation: PACU Anesthesia Type: General Level of consciousness: awake and alert Pain management: pain level controlled Vital Signs Assessment: post-procedure vital signs reviewed and stable Respiratory status: spontaneous breathing, nonlabored ventilation and respiratory function stable Cardiovascular status: blood pressure returned to baseline and stable Postop Assessment: no apparent nausea or vomiting Anesthetic complications: no  No notable events documented.  Last Vitals:  Vitals:   10/25/22 0945 10/25/22 1020  BP: 124/76 (!) 142/85  Pulse: 85 83  Resp: 18 19  Temp: 37 C 36.5 C  SpO2: 94% 94%    Last Pain:  Vitals:   10/25/22 0945  TempSrc:   PainSc: 5                  Mollyann Halbert,W. EDMOND

## 2022-10-25 NOTE — Transfer of Care (Signed)
Immediate Anesthesia Transfer of Care Note  Patient: Ronald Duran  Procedure(s) Performed: LEFT INTRAMEDULLARY (IM) NAIL INTERTROCHANTERIC (Left)  Patient Location: PACU  Anesthesia Type:General  Level of Consciousness: drowsy, patient cooperative, and responds to stimulation  Airway & Oxygen Therapy: Patient Spontanous Breathing and Patient connected to face mask oxygen  Post-op Assessment: Report given to RN, Post -op Vital signs reviewed and stable, Patient moving all extremities X 4, and Patient able to stick tongue midline  Post vital signs: Reviewed  Last Vitals:  Vitals Value Taken Time  BP 115/79 10/25/22 0854  Temp 98.5   Pulse 79 10/25/22 0856  Resp 14 10/25/22 0857  SpO2 91 % 10/25/22 0856  Vitals shown include unvalidated device data.  Last Pain:  Vitals:   10/25/22 0619  TempSrc:   PainSc: 0-No pain         Complications: No notable events documented.

## 2022-10-25 NOTE — Progress Notes (Signed)
ANTICOAGULATION CONSULT NOTE - Initial Consult  Pharmacy Consult for Lovenox/Coumadin Indication: atrial fibrillation  No Known Allergies  Patient Measurements: Height: 6' (182.9 cm) Weight: 86.2 kg (190 lb) IBW/kg (Calculated) : 77.6  Vital Signs: Temp: 97.7 F (36.5 C) (03/19 1020) Temp Source: Oral (03/19 0604) BP: 142/85 (03/19 1020) Pulse Rate: 83 (03/19 1020)  Labs: Recent Labs    10/25/22 0623  HGB 15.5  HCT 44.1  PLT 152  CREATININE 1.12    Estimated Creatinine Clearance: 58.7 mL/min (by C-G formula based on SCr of 1.12 mg/dL).   Medical History: Past Medical History:  Diagnosis Date   Atrial fibrillation (Ronald Duran)    CAD (coronary artery disease)    a. s/p MI with LAD stent placement December, 1999 -last stress test November 2008 with no EKG evidence of ischemia  b. cath 07/23/2015 after abnormal ETT and nuc, occluded mid LAD stent with good distal collateral, medical therapy    Cardiomyopathy ([name])    Ischemic cardiomyopathy with EF 40% by echo 2005   Dyslipidemia    Erectile dysfunction    Hiatal hernia    with esophageal strictures and dilatations in the past - Dr. Delfin Edis   History of nuclear stress test    Myoview 12/16: EF 47%, anterior, anteroseptal and inferoseptal defect suggesting infarct with peri-infarct ischemia, intermediate risk   HTN (hypertension)    Hx of seasonal allergies    Hyperlipidemia    Myocardial infarction ([place]) 08/01/1998   PAD (peripheral artery disease) ([place])    Pre-diabetes    Prostate cancer ([name])    with seed implant therapy following Lupron therapy in 2008 and 2009 - followed by [name]   Ringworm    Stroke ([name]) 04/20/2017   Vitamin D deficiency     Medications:  Medications Prior to Admission  Medication Sig Dispense Refill Last Dose   amLODipine (NORVASC) 5 MG tablet Take 1 tablet (5 mg total) by mouth daily. 90 tablet 3 10/25/2022   atorvastatin (LIPITOR) 40 MG tablet Take 40 mg by mouth at bedtime.    10/24/2022   B Complex Vitamins (B COMPLEX 100) tablet Take 1 tablet by mouth daily. Balance   Past Week   benazepril-hydrochlorthiazide (LOTENSIN HCT) 20-12.5 MG tablet Take 1 tablet by mouth daily. 90 tablet 3 Past Week   cetirizine (ZYRTEC) 10 MG tablet Take 10 mg by mouth daily.   Past Week   Cholecalciferol (VITAMIN D3) 2000 UNITS TABS Take 2,000 Units by mouth daily.   Past Week   Coenzyme Q10 (CO Q-10) 100 MG CAPS Take 100 mg by mouth daily with supper.   Past Week   enoxaparin (LOVENOX) 80 MG/0.8ML injection Inject 0.8 mLs (80 mg total) into the skin every 12 (twelve) hours. 13.6 mL 0 10/24/2022   metoprolol succinate (TOPROL XL) 25 MG 24 hr tablet Take 1 tablet (25 mg total) by mouth daily. 90 tablet 3 10/25/2022   Multiple Vitamins-Minerals (CENTRUM) tablet Take 1 tablet by mouth daily.   Past Week   nitroGLYCERIN (NITROSTAT) 0.4 MG SL tablet PLACE 1 TABLET UNDER THE TONGUE IF NEEDED EVERY 5 MINUTES FOR CHEST PAIN FOR 3 DOSES IF NO RELIEF AFTER FIRST DOSE CALL 911. 25 tablet 9    omeprazole (PRILOSEC) 20 MG capsule Take 20 mg by mouth daily before breakfast.   10/25/2022   warfarin (COUMADIN) 5 MG tablet TAKE 1/2 TO 1 TABLET BY MOUTH DAILY AS DIRECTED BY COUMADIN CLINIC (Patient taking differently: Take 2.5-5 mg by mouth See  admin instructions. Take 2.5 mg by mouth on Monday, Wednesday and Friday and take 5 mg on Tuesday, Thursday, Saturday and Sunday) 100 tablet 0 Past Week    Assessment: Pt s/p stabilization of L femoral fracture 3/19. Okay to restart Lovenox 80mg  SQ q12h bridge on POD #1 (3/20). Pt has written instructions re: restart of coumadin 3/20 pm from [place] office. If pt unable to be d/c 3/20, will need to enter coumadin dose.  Goal of Therapy:  INR 2-3 Monitor platelets by anticoagulation protocol: Yes   Plan:  On 3/20, start Lovenox 80mg  SQ q12h Daily INR  Sherlon Handing, PharmD, BCPS Please see amion for complete clinical pharmacist phone list 10/25/2022,4:34 PM

## 2022-10-26 ENCOUNTER — Encounter (HOSPITAL_COMMUNITY): Payer: Self-pay | Admitting: Orthopaedic Surgery

## 2022-10-26 LAB — PROTIME-INR
INR: 1.1 (ref 0.8–1.2)
Prothrombin Time: 14.3 seconds (ref 11.4–15.2)

## 2022-10-26 LAB — SURGICAL PATHOLOGY

## 2022-10-26 MED ORDER — OXYCODONE HCL 5 MG PO TABS: 5.0000 mg | ORAL_TABLET | Freq: Four times a day (QID) | ORAL | 0 refills | Status: DC | PRN

## 2022-10-26 NOTE — Discharge Instructions (Addendum)
  Discharge instructions  Weight bearing as tolerated Do not get bandage wet Take anticoagulants as instructed  Activity Walk each and every day, increasing distance each day.  Call Your Doctor If Any of These Occur Redness, drainage, or swelling at the wound.  Temperature greater than 101 degrees. Severe pain not relieved by pain medication. Incision starts to come apart.

## 2022-10-26 NOTE — Progress Notes (Signed)
Subjective: 1 Day Post-Op Procedure(s) (LRB): LEFT INTRAMEDULLARY (IM) NAIL INTERTROCHANTERIC (Left) Patient reports pain as mild.  Feeling well this am.   Objective: Vital signs in last 24 hours: Temp:  [97.6 F (36.4 C)-98.6 F (37 C)] 98.3 F (36.8 C) (03/20 0434) Pulse Rate:  [63-86] 77 (03/20 0434) Resp:  [15-24] 16 (03/20 0434) BP: (106-142)/(76-86) 106/85 (03/20 0434) SpO2:  [94 %-99 %] 95 % (03/20 0434)  Intake/Output from previous day: 03/19 0701 - 03/20 0700 In: [id number] [P.O.:720; I.V.:700] Out: 450 [Urine:400; Blood:50] Intake/Output this shift: No intake/output data recorded.  Recent Labs    10/25/22 0623  HGB 15.5   Recent Labs    10/25/22 0623  WBC 9.0  RBC 4.56  HCT 44.1  PLT 152   Recent Labs    10/25/22 0623  NA 134*  K 3.5  CL 102  CO2 25  BUN 19  CREATININE 1.12  GLUCOSE 170*  CALCIUM 9.1   Recent Labs    10/26/22 0411  INR 1.1    Neurologically intact Neurovascular intact Sensation intact distally Intact pulses distally Dorsiflexion/Plantar flexion intact Incision: dressing C/D/I No cellulitis present Compartment soft   Assessment/Plan: 1 Day Post-Op Procedure(s) (LRB): LEFT INTRAMEDULLARY (IM) NAIL INTERTROCHANTERIC (Left) Advance diet Up with therapy D/C IV fluids WBAT LLE Resume anticoagulants as instructed  D/c once cleared by PT      Aundra Dubin 10/26/2022, 7:56 AM

## 2022-10-26 NOTE — TOC Transition Note (Signed)
Transition of Care [facility]) - CM/SW Discharge Note   Patient Details  Name: Ronald Duran MRN: [id number] Date of Birth: [birth year]  [facility] CM/SW Contact:  Pollie Friar, RN Phone Number: 10/26/2022, 10:33 AM   Clinical Narrative:    Pt is discharging home with home health services through [place]. Cory with [facility] accepted the referral. Information on the AVS.  Pt has supervision at home per spouse.  Spouse to provide transport home.   Final next level of care: Home w Home Health Services Barriers to Discharge: No Barriers Identified   Patient Goals and CMS Choice CMS Medicare.gov Compare Post Acute Care list provided to:: Patient Choice offered to / list presented to : Patient, Spouse  Discharge Placement                         Discharge Plan and Services Additional resources added to the After Visit Summary for                            [facility] Arranged: [facility] Agency: [place] Date [place]: 10/26/22   Representative spoke with at [place]: [place] ([place]) Interventions SDOH Screenings   Food Insecurity: No Food Insecurity (10/12/2022)  Housing: Low Risk  (10/12/2022)  Transportation Needs: No Transportation Needs (10/12/2022)  Utilities: Not At Risk (10/12/2022)  Depression (PHQ2-9): Low Risk  (10/12/2022)  Tobacco Use: Low Risk  (10/25/2022)     Readmission Risk Interventions     No data to display

## 2022-10-26 NOTE — Discharge Summary (Signed)
Patient ID: Ronald Duran MRN: [id number] DOB/AGE: 80-06-1941 80 y.o.  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 s/p MI with LAD stent placement December, 1999 -last stress test November 2008 with no EKG evidence of ischemia  b. cath 07/23/2015 after abnormal ETT and nuc, occluded mid LAD stent with good distal collateral, medical therapy    Cardiomyopathy ([place])    Ischemic cardiomyopathy with EF 40% by echo 2005   Dyslipidemia    Erectile dysfunction    Hiatal hernia    with esophageal strictures and dilatations in the past - Dr. Delfin Edis   History of nuclear stress test    Myoview 12/16: EF 47%, anterior, anteroseptal and inferoseptal defect suggesting infarct with peri-infarct ischemia, intermediate risk   HTN (hypertension)    Hx of seasonal allergies    Hyperlipidemia    Myocardial infarction ([place]) 08/01/1998   PAD (peripheral artery disease) ([place])    Pre-diabetes    Prostate cancer ([place])    with seed implant therapy following Lupron therapy in 2008 and 2009 - followed by [name]   Ringworm    Stroke [facility]) 04/20/2017   Vitamin D deficiency     Surgeries: Procedure(s): LEFT INTRAMEDULLARY (IM) NAIL INTERTROCHANTERIC on 10/25/2022   Consultants:   Discharged Condition: Improved  Hospital Course: Ronald Duran is an 82 y.o. male who was admitted 10/25/2022 for operative treatment ofMetastasis from malignant neoplasm of bone ([place]). Patient has severe unremitting pain that affects sleep, daily activities, and work/hobbies. After pre-op clearance the patient was taken to the operating room on 10/25/2022 and underwent  Procedure(s): LEFT INTRAMEDULLARY (IM) NAIL INTERTROCHANTERIC.     Patient was given perioperative antibiotics:  Anti-infectives (From admission, onward)    Start     Dose/Rate Route Frequency Ordered Stop   10/25/22 1400  ceFAZolin (ANCEF) IVPB 2g/100 mL premix        2 g 200 mL/hr over 30 Minutes Intravenous Every 6 hours 10/25/22 0946 10/26/22 0757   10/25/22 0615  ceFAZolin (ANCEF) IVPB 2g/100 mL premix        2 g 200 mL/hr over 30 Minutes Intravenous On call to O.R. 10/25/22 0603 10/25/22 0750   10/25/22 0611  ceFAZolin (ANCEF) 2-4 GM/100ML-% IVPB       Note to Pharmacy: [place] Floor: cabinet override      10/25/22 0611 10/25/22 0802        Patient was given sequential compression devices, early ambulation, and chemoprophylaxis to prevent DVT.  Patient benefited maximally from hospital stay and there were no complications.    Recent vital signs: Patient Vitals for the past 24 hrs:  BP Temp Temp src Pulse Resp SpO2  10/26/22 0810 117/77 98 F (36.7 C) Oral 70 16 96 %  10/26/22 0434 106/85 98.3 F (36.8 C) Oral 77 16 95 %  10/25/22 2307 130/82 97.6 F (36.4 C) Oral 82 18 97 %  10/25/22 1637 108/83 98.2 F (36.8 C) Oral 63 16 95 %  10/25/22 1020 (!) 142/85 97.7 F (36.5 C) -- 83 19 94 %  10/25/22 0945 124/76 98.6 F (37 C) -- 85 18 94 %  10/25/22 0928 -- -- -- 76 16 96 %  10/25/22 0921 -- -- --  86 20 95 %  10/25/22 0915 117/78 -- -- 77 18 99 %  10/25/22 0900 116/86 -- -- 80 15 99 %  10/25/22 0855 115/79 98.3 F (36.8 C) -- 72 (!) 24 97 %     Recent laboratory studies:  Recent Labs    10/25/22 0623 10/26/22 0411  WBC 9.0  --   HGB 15.5  --   HCT 44.1  --   PLT 152  --   NA 134*  --   K 3.5  --   CL 102  --   CO2 25  --   BUN 19  --   CREATININE 1.12  --   GLUCOSE 170*  --   INR  --  1.1  CALCIUM 9.1  --      Discharge Medications:   Allergies as of 10/26/2022   No Known Allergies      Medication List     TAKE these medications    amLODipine 5 MG tablet Commonly known as: NORVASC Take 1 tablet (5 mg  total) by mouth daily.   atorvastatin 40 MG tablet Commonly known as: LIPITOR Take 40 mg by mouth at bedtime.   B Complex 100 tablet Take 1 tablet by mouth daily. Balance   benazepril-hydrochlorthiazide 20-12.5 MG tablet Commonly known as: LOTENSIN HCT Take 1 tablet by mouth daily.   Centrum tablet Take 1 tablet by mouth daily.   cetirizine 10 MG tablet Commonly known as: ZYRTEC Take 10 mg by mouth daily.   Co Q-10 100 MG Caps Take 100 mg by mouth daily with supper.   enoxaparin 80 MG/0.8ML injection Commonly known as: LOVENOX Inject 0.8 mLs (80 mg total) into the skin every 12 (twelve) hours.   metoprolol succinate 25 MG 24 hr tablet Commonly known as: Toprol XL Take 1 tablet (25 mg total) by mouth daily.   nitroGLYCERIN 0.4 MG SL tablet Commonly known as: NITROSTAT PLACE 1 TABLET UNDER THE TONGUE IF NEEDED EVERY 5 MINUTES FOR CHEST PAIN FOR 3 DOSES IF NO RELIEF AFTER FIRST DOSE CALL 911.   omeprazole 20 MG capsule Commonly known as: PRILOSEC Take 20 mg by mouth daily before breakfast.   oxyCODONE 5 MG immediate release tablet Commonly known as: Oxy IR/ROXICODONE Take 1-2 tablets (5-10 mg total) by mouth every 6 (six) hours as needed for moderate pain (pain score 4-6).   Vitamin D3 50 MCG (2000 UT) Tabs Generic drug: Cholecalciferol Take 2,000 Units by mouth daily.   warfarin 5 MG tablet Commonly known as: COUMADIN Take as directed. If you are unsure how to take this medication, talk to your nurse or doctor. Original instructions: TAKE 1/2 TO 1 TABLET BY MOUTH DAILY AS DIRECTED BY COUMADIN CLINIC What changed:  how much to take how to take this when to take this additional instructions               Durable Medical Equipment  (From admission, onward)           Start     Ordered   10/25/22 1017  DME Walker rolling  Once       Question:  Patient needs a walker to treat with the following condition  Answer:  History of open reduction and  internal fixation (ORIF) procedure   10/25/22 1016   10/25/22 1017  DME 3 n 1  Once        10/25/22 1016   10/25/22 1017  DME Bedside commode  Once  Question:  Patient needs a bedside commode to treat with the following condition  Answer:  History of open reduction and internal fixation (ORIF) procedure   10/25/22 1016            Diagnostic Studies: DG FEMUR MIN 2 VIEWS LEFT  Result Date: 10/25/2022 CLINICAL DATA:  Left femoral nail placement due to lytic bone lesion in the femoral neck EXAM: LEFT FEMUR 2 VIEWS; DG C-ARM 1-60 MIN-NO REPORT COMPARISON:  PET-CT 09/19/2022 FINDINGS: Four C-arm fluoroscopic images were obtained intraoperatively and submitted for post operative interpretation. Long intramedullary rod with proximal lag screws traverse lytic lesion in the left femoral neck. Hardware appears appropriately positioned. No perihardware fracture. 1 minute 14 seconds fluoroscopy time utilized. Radiation dose: 24.94 mGy. Please see the performing provider's procedural report for further detail. IMPRESSION: Fluoroscopic guidance provided for left femoral nail placement. Electronically Signed   By: Davina Poke D.O.   On: 10/25/2022 08:58   DG C-Arm 1-60 Min-No Report  Result Date: 10/25/2022 CLINICAL DATA:  Left femoral nail placement due to lytic bone lesion in the femoral neck EXAM: LEFT FEMUR 2 VIEWS; DG C-ARM 1-60 MIN-NO REPORT COMPARISON:  PET-CT 09/19/2022 FINDINGS: Four C-arm fluoroscopic images were obtained intraoperatively and submitted for post operative interpretation. Long intramedullary rod with proximal lag screws traverse lytic lesion in the left femoral neck. Hardware appears appropriately positioned. No perihardware fracture. 1 minute 14 seconds fluoroscopy time utilized. Radiation dose: 24.94 mGy. Please see the performing provider's procedural report for further detail. IMPRESSION: Fluoroscopic guidance provided for left femoral nail placement. Electronically Signed    By: Davina Poke D.O.   On: 10/25/2022 08:58   XR HIP UNILAT W OR W/O PELVIS 2-3 VIEWS LEFT  Result Date: 10/19/2022 AP pelvis lateral view left hip reviewed.  Lytic lesion present in the middle of the femoral neck.  No cortical involvement.  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 present  IR Bone Tumor(s)RF Ablation  Result Date: 10/11/2022 INDICATION: 82 year old male with hypermetabolic [id number] vertebral body lytic lesion concerning for metastatic disease. Multiple pulmonary nodules and left femoral neck hypermetabolic lesion are also identified on prior studies. He comes today for a core bone biopsy followed by radiofrequency ablation and kyphoplasty. EXAM: T10 VERTEBRAL BODY CORE BIOPSY, RADIOFREQUENCY ABLATION AND BALLOON KYPHOPLASTY COMPARISON:  MRI of the thoracic spine September 27, 2022. MEDICATIONS: Ancef 2 g IV; The antibiotic was administered in an appropriate time interval prior to needle puncture of the skin. ANESTHESIA/SEDATION: Total intra-service Moderate Sedation Time: 54 minute. A total of 2 mg of Versed and 100 mcg of fentanyl were administered intravenously by the radiology nurse low assisted in monitoring the patients level of consciousness and vital signs continuously throughout the procedure under my direct supervision. FLUOROSCOPY: Radiation Exposure Index (as provided by the fluoroscopic device): XX123456 mGy Kerma COMPLICATIONS: None immediate. TECHNIQUE: Informed written consent was obtained from the patient after a thorough discussion of the procedural risks, benefits and alternatives. All questions were addressed. Maximal Sterile Barrier Technique was utilized including caps, mask, sterile gowns, sterile gloves, sterile drape, hand hygiene and skin antiseptic. A timeout was performed prior to the initiation of the procedure. The patient was placed in prone position on the angiography table. The thoracic region was prepped and draped in a sterile fashion. Under  fluoroscopy, the T10 vertebral body was delineated and the skin area was marked. The skin was infiltrated with a 1% Lidocaine approximately 3.5 cm lateral to the spinous process projection  on the right. Using a 22-gauge spinal needle, the soft issue and the peripedicular space and periosteum were infiltrated with Bupivacaine 0.5%. A skin incision was made at the access site. Subsequently, an 11-gauge Kyphon trocar was inserted under fluoroscopic guidance until contact with the pedicle was obtained. The trocar was inserted into the pedicle with light hammer tapping until the posterior boundaries of the vertebral body was reached. The diamond mandrill was removed and one core biopsy was obtained. The skin was infiltrated with a 1% Lidocaine approximately 3 cm lateral to the spinous process projection on the left. Using a 22-gauge spinal needle, the soft issue and the peripedicular space and periosteum were infiltrated with Bupivacaine 0.5%. A skin incision was made at the access site. Subsequently, an 11-gauge Kyphon trocar was inserted under fluoroscopic guidance until contact with the pedicle was obtained. The trocar was inserted with light hammer tapping into the pedicle until the posterior boundaries of the vertebral body was reached. The diamond mandrill was removed and one core biopsy was obtained. A bone drill was coaxially advanced within the anterior third of the vertebral body on each side for proper RF probes size selection. An OsteoCool RF probe was inserted bilaterally within the mid-posterior third of the vertebral body. The radiofrequency ablation cycle was performed. Thereafter, the RF probes were exchanged for inflatable Kyphon balloons, which were centered within the mid-aspect of the vertebral body. The balloons were inflated to create a void to serve as a repository for the bone cement. Both balloons were deflated and through both cannulas, under continuous fluoroscopy guidance in the AP and  lateral views, the vertebral body was filled with previously mixed polymethyl-methacrylate (PMMA) added to barium for opacification. Both cannulas were later removed. FINDINGS: Postprocedural radiographic images showed cement distribution within the anterior 2/3 of the vertebral body. IMPRESSION: Successful and uncomplicated [id number] vertebral body core bone biopsy followed by radiofrequency ablation and balloon kyphoplasty for treatment of hypermetabolic lytic lesion. Core biopsy samples were sent to the laboratory for tissue exam. Electronically Signed   By: Pedro Earls M.D.   On: 10/11/2022 14:33   IR KYPHO THORACIC WITH BONE BIOPSY  Result Date: 10/11/2022 INDICATION: 82 year old male with hypermetabolic [id number] vertebral body lytic lesion concerning for metastatic disease. Multiple pulmonary nodules and left femoral neck hypermetabolic lesion are also identified on prior studies. He comes today for a core bone biopsy followed by radiofrequency ablation and kyphoplasty. EXAM: T10 VERTEBRAL BODY CORE BIOPSY, RADIOFREQUENCY ABLATION AND BALLOON KYPHOPLASTY COMPARISON:  MRI of the thoracic spine September 27, 2022. MEDICATIONS: Ancef 2 g IV; The antibiotic was administered in an appropriate time interval prior to needle puncture of the skin. ANESTHESIA/SEDATION: Total intra-service Moderate Sedation Time: 54 minute. A total of 2 mg of Versed and 100 mcg of fentanyl were administered intravenously by the radiology nurse low assisted in monitoring the patients level of consciousness and vital signs continuously throughout the procedure under my direct supervision. FLUOROSCOPY: Radiation Exposure Index (as provided by the fluoroscopic device): XX123456 mGy Kerma COMPLICATIONS: None immediate. TECHNIQUE: Informed written consent was obtained from the patient after a thorough discussion of the procedural risks, benefits and alternatives. All questions were addressed. Maximal Sterile Barrier Technique was  utilized including caps, mask, sterile gowns, sterile gloves, sterile drape, hand hygiene and skin antiseptic. A timeout was performed prior to the initiation of the procedure. The patient was placed in prone position on the angiography table. The thoracic region was prepped and draped in a sterile fashion.  Under fluoroscopy, the T10 vertebral body was delineated and the skin area was marked. The skin was infiltrated with a 1% Lidocaine approximately 3.5 cm lateral to the spinous process projection on the right. Using a 22-gauge spinal needle, the soft issue and the peripedicular space and periosteum were infiltrated with Bupivacaine 0.5%. A skin incision was made at the access site. Subsequently, an 11-gauge Kyphon trocar was inserted under fluoroscopic guidance until contact with the pedicle was obtained. The trocar was inserted into the pedicle with light hammer tapping until the posterior boundaries of the vertebral body was reached. The diamond mandrill was removed and one core biopsy was obtained. The skin was infiltrated with a 1% Lidocaine approximately 3 cm lateral to the spinous process projection on the left. Using a 22-gauge spinal needle, the soft issue and the peripedicular space and periosteum were infiltrated with Bupivacaine 0.5%. A skin incision was made at the access site. Subsequently, an 11-gauge Kyphon trocar was inserted under fluoroscopic guidance until contact with the pedicle was obtained. The trocar was inserted with light hammer tapping into the pedicle until the posterior boundaries of the vertebral body was reached. The diamond mandrill was removed and one core biopsy was obtained. A bone drill was coaxially advanced within the anterior third of the vertebral body on each side for proper RF probes size selection. An OsteoCool RF probe was inserted bilaterally within the mid-posterior third of the vertebral body. The radiofrequency ablation cycle was performed. Thereafter, the RF probes  were exchanged for inflatable Kyphon balloons, which were centered within the mid-aspect of the vertebral body. The balloons were inflated to create a void to serve as a repository for the bone cement. Both balloons were deflated and through both cannulas, under continuous fluoroscopy guidance in the AP and lateral views, the vertebral body was filled with previously mixed polymethyl-methacrylate (PMMA) added to barium for opacification. Both cannulas were later removed. FINDINGS: Postprocedural radiographic images showed cement distribution within the anterior 2/3 of the vertebral body. IMPRESSION: Successful and uncomplicated XX123456 vertebral body core bone biopsy followed by radiofrequency ablation and balloon kyphoplasty for treatment of hypermetabolic lytic lesion. Core biopsy samples were sent to the laboratory for tissue exam. Electronically Signed   By: Pedro Earls M.D.   On: 10/11/2022 14:33   MR THORACIC SPINE W WO CONTRAST  Result Date: 09/28/2022 CLINICAL DATA:  Better Ulyess Mort prostate cancer with abnormal PET CT and/. Your evaluate T10 talking EXAM: MRI THORACIC WITHOUT AND WITH CONTRAST TECHNIQUE: Multiplanar and multiecho pulse sequences of the thoracic spine were obtained without and with intravenous contrast. CONTRAST:  41mL GADAVIST GADOBUTROL 1 MMOL/ML IV SOLN COMPARISON:  None Available. FINDINGS: Alignment:  Normal Vertebrae: 2.2 cm lesion in the vertebral body of T10. Subcentimeter lesions also seen in the T4 spinous process, T9 posterior body, and T11 left para median body. No fracture or extraosseous tumor extension. Cord:  No evidence of intrathecal metastasis. Paraspinal and other soft tissues: Negative for paravertebral mass or inflammation. Disc levels: No significant degenerative change.  No neural impingement. 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 Additional subcentimeter deposits in the T4 spinous process and T9, T11 vertebral bodies. No fracture or soft tissue  extension. Electronically Signed   By: Jorje Guild M.D.   On: 09/28/2022 20:44    Disposition: Discharge disposition: 01-Home or Self Care          Follow-up Information     Leandrew Koyanagi, MD. Schedule an appointment as soon  as possible for a visit in 2 week(s).   Specialty: Orthopedic Surgery Contact information: [address] [place] [id number] [phone]                  Signed: Aundra Dubin 10/26/2022, 8:15 AM

## 2022-10-26 NOTE — Progress Notes (Signed)
Pt. discharged home accompanied by wife. Prescriptions and discharge instructions given with verbalization of understanding. Incision site on Lt. Hip with no s/s of infection - no swelling, redness, bleeding, and/or drainage noted. Pt. transported out of this unit in wheelchair by the staff of the unit with all belongings with wife.

## 2022-10-26 NOTE — Progress Notes (Signed)
Physical Therapy Treatment Patient Details Name: Ronald Duran MRN: [id number] DOB: 1940/12/25 Today's Date: 10/26/2022   History of Present Illness Pt is a 82 y/o male who presents 10/25/2022 s/p prophylactic fixation of L femoral neck 2 lytic lesion. PMH significant for cardiomyopathy, HTN, MI, PAD, prostate CA, CVA, hernia s/p repair.    PT Comments    Pt progressing towards physical therapy goals. RW use throughout session however pt mobilizing well at a min guard assist level. Completed stair training, and pt was instructed in HEP. Will continue to follow and progress as able per POC.     Recommendations for follow up therapy are one component of a multi-disciplinary discharge planning process, led by the attending physician.  Recommendations may be updated based on patient status, additional functional criteria and insurance authorization.  Follow Up Recommendations  Home health PT     Assistance Recommended at Discharge Frequent or constant Supervision/Assistance  Patient can return home with the following A little help with walking and/or transfers;A little help with bathing/dressing/bathroom;Assistance with cooking/housework;Assist for transportation;Help with stairs or ramp for entrance   Equipment Recommendations  None recommended by PT    Recommendations for Other Services       Precautions / Restrictions Precautions Precautions: Fall Restrictions Weight Bearing Restrictions: Yes LLE Weight Bearing: Weight bearing as tolerated     Mobility  Bed Mobility Overal bed mobility: Modified Independent Bed Mobility: Supine to Sit           General bed mobility comments: HOB flat and rails lowered to simulate home environment. No assist required.    Transfers Overall transfer level: Needs assistance Equipment used: Rolling walker (2 wheels) Transfers: Sit to/from Stand Sit to Stand: Min guard           General transfer comment: No assist required for  power up to full stand. Increased time. VC's for hand placement on seated surface for safety.    Ambulation/Gait Ambulation/Gait assistance: Min guard Gait Distance (Feet): 300 Feet Assistive device: Rolling walker (2 wheels) Gait Pattern/deviations: Step-through pattern, Decreased stride length, Trunk flexed Gait velocity: Decreased Gait velocity interpretation: 1.31 - 2.62 ft/sec, indicative of limited community ambulator   General Gait Details: VC's for improved posture, increased heel strike, and forward gaze. No assist required and no overt LOB noted.   Stairs Stairs: Yes Stairs assistance: Min guard Stair Management: Two rails, Step to pattern, Forwards Number of Stairs: 4 General stair comments: VC's for sequencing and general safety.   Wheelchair Mobility    Modified Rankin (Stroke Patients Only)       Balance Overall balance assessment: Needs assistance Sitting-balance support: Feet supported, No upper extremity supported Sitting balance-Leahy Scale: Fair     Standing balance support: During functional activity, Bilateral upper extremity supported Standing balance-Leahy Scale: Poor                              Cognition Arousal/Alertness: Awake/alert Behavior During Therapy: WFL for tasks assessed/performed Overall Cognitive Status: Within Functional Limits for tasks assessed                                          Exercises General Exercises - Lower Extremity Ankle Circles/Pumps: 10 reps Quad Sets: 10 reps Short Arc Quad: 10 reps Long Arc Quad: 10 reps Heel Slides: 10 reps Hip ABduction/ADduction: 10  reps    General Comments        Pertinent Vitals/Pain Pain Assessment Pain Assessment: Faces Faces Pain Scale: Hurts even more Pain Location: L hip Pain Descriptors / Indicators: Operative site guarding, Discomfort Pain Intervention(s): Limited activity within patient's tolerance, Monitored during session,  Repositioned    Home Living Family/patient expects to be discharged to:: Private residence Living Arrangements: Spouse/significant other Available Help at Discharge: Family;Available 24 hours/day Type of Home: House Home Access: Stairs to enter Entrance Stairs-Rails: Right;Left;Can reach both Entrance Stairs-Number of Steps: 2   Home Layout: One level Home Equipment: Conservation officer, nature (2 wheels);Toilet riser      Prior Function            PT Goals (current goals can now be found in the care plan section) Acute Rehab PT Goals Patient Stated Goal: Home tomorrow PT Goal Formulation: With patient/family Time For Goal Achievement: 11/01/22 Potential to Achieve Goals: Good Progress towards PT goals: Progressing toward goals    Frequency    Min 5X/week      PT Plan Current plan remains appropriate    Co-evaluation              AM-PAC PT "6 Clicks" Mobility   Outcome Measure  Help needed turning from your back to your side while in a flat bed without using bedrails?: None Help needed moving from lying on your back to sitting on the side of a flat bed without using bedrails?: None Help needed moving to and from a bed to a chair (including a wheelchair)?: A Little Help needed standing up from a chair using your arms (e.g., wheelchair or bedside chair)?: A Little Help needed to walk in hospital room?: A Little Help needed climbing 3-5 steps with a railing? : A Little 6 Click Score: 20    End of Session Equipment Utilized During Treatment: Gait belt Activity Tolerance: Patient tolerated treatment well Patient left: in chair;with call bell/phone within reach;with family/visitor present Nurse Communication: Mobility status PT Visit Diagnosis: Unsteadiness on feet (R26.81);Pain Pain - Right/Left: Left Pain - part of body: Leg;Hip     Time: [id number] PT Time Calculation (min) (ACUTE ONLY): 43 min  Charges:  $Gait Training: 23-37 mins $Therapeutic Exercise: 8-22  mins                     Rolinda Roan, PT, DPT Acute Rehabilitation Services Secure Chat Preferred Office: [phone]    Thelma Comp 10/26/2022, 9:47 AM

## 2022-10-31 ENCOUNTER — Ambulatory Visit: Payer: Medicare Other | Attending: Cardiovascular Disease

## 2022-10-31 DIAGNOSIS — Z79899 Other long term (current) drug therapy: Secondary | ICD-10-CM

## 2022-10-31 DIAGNOSIS — I4819 Other persistent atrial fibrillation: Secondary | ICD-10-CM | POA: Diagnosis not present

## 2022-10-31 DIAGNOSIS — Z7901 Long term (current) use of anticoagulants: Secondary | ICD-10-CM | POA: Diagnosis not present

## 2022-10-31 DIAGNOSIS — I6349 Cerebral infarction due to embolism of other cerebral artery: Secondary | ICD-10-CM | POA: Diagnosis not present

## 2022-10-31 LAB — POCT INR: INR: 1.4 — AB (ref 2.0–3.0)

## 2022-10-31 MED ORDER — ENOXAPARIN SODIUM 80 MG/0.8ML IJ SOSY: 80.0000 mg | PREFILLED_SYRINGE | Freq: Two times a day (BID) | INTRAMUSCULAR | 1 refills | Status: DC

## 2022-10-31 NOTE — Patient Instructions (Addendum)
Description   Take 1.5 tablets today and 1.5 tablets tomorrow then continue to take warfarin 1 tablet daily except for 1/2 a tablet on Monday, Wednesday and Friday.  On 11/09/22, follow pre/post procedure instructions.  Continue eating broccoli only every 2-3 days per week.  Recheck INR 1 week post procedure.  [facility]-[phone].  Please let us know if you have any med changes or upcoming procedures.        11/09/22: Last dose of warfarin.  11/10/22: No warfarin or enoxaparin (Lovenox).  11/11/22: Inject enoxaparin 80mg  in the fatty abdominal tissue at least 2 inches from the belly button twice a day about 12 hours apart, 8am and 8pm rotate sites. No warfarin.  11/12/22: Inject enoxaparin in the fatty tissue every 12 hours, 8am and 8pm. No warfarin.  11/13/22: Inject enoxaparin in the fatty tissue every 12 hours, 8am and 8pm. No warfarin.  11/14/22: Inject enoxaparin in the fatty tissue in the morning at 8 am (No PM dose). No warfarin.  11/15/22: Procedure Day - No enoxaparin - Resume warfarin in the evening or as directed by doctor (take an extra half tablet with usual dose for 2 days then resume normal dose).  11/16/22: Resume enoxaparin inject in the fatty tissue every 12 hours and take warfarin  11/17/22: Inject enoxaparin in the fatty tissue every 12 hours and take warfarin  11/18/22: Inject enoxaparin in the fatty tissue every 12 hours and take warfarin  11/19/22: Inject enoxaparin in the fatty tissue every 12 hours and take warfarin  11/20/22: Inject enoxaparin in the fatty tissue every 12 hours and take warfarin  11/21/22: Inject enoxaparin in the fatty tissue and warfarin appt to check INR.

## 2022-11-02 ENCOUNTER — Telehealth: Payer: Self-pay

## 2022-11-02 ENCOUNTER — Inpatient Hospital Stay: Payer: Medicare Other

## 2022-11-02 ENCOUNTER — Encounter: Payer: Self-pay | Admitting: Internal Medicine

## 2022-11-02 ENCOUNTER — Inpatient Hospital Stay (HOSPITAL_BASED_OUTPATIENT_CLINIC_OR_DEPARTMENT_OTHER): Payer: Medicare Other | Admitting: Internal Medicine

## 2022-11-02 VITALS — BP 110/85 | HR 62 | Temp 98.0°F | Resp 16 | Wt 190.9 lb

## 2022-11-02 DIAGNOSIS — R768 Other specified abnormal immunological findings in serum: Secondary | ICD-10-CM | POA: Diagnosis not present

## 2022-11-02 DIAGNOSIS — C9030 Solitary plasmacytoma not having achieved remission: Secondary | ICD-10-CM | POA: Diagnosis not present

## 2022-11-02 DIAGNOSIS — C801 Malignant (primary) neoplasm, unspecified: Secondary | ICD-10-CM | POA: Diagnosis not present

## 2022-11-02 DIAGNOSIS — C7951 Secondary malignant neoplasm of bone: Secondary | ICD-10-CM

## 2022-11-02 DIAGNOSIS — R918 Other nonspecific abnormal finding of lung field: Secondary | ICD-10-CM | POA: Diagnosis not present

## 2022-11-02 DIAGNOSIS — C9000 Multiple myeloma not having achieved remission: Secondary | ICD-10-CM

## 2022-11-02 LAB — CMP (CANCER CENTER ONLY)
ALT: 58 U/L — ABNORMAL HIGH (ref 0–44)
AST: 46 U/L — ABNORMAL HIGH (ref 15–41)
Albumin: 3.8 g/dL (ref 3.5–5.0)
Alkaline Phosphatase: 77 U/L (ref 38–126)
Anion gap: 7 (ref 5–15)
BUN: 22 mg/dL (ref 8–23)
CO2: 27 mmol/L (ref 22–32)
Calcium: 10 mg/dL (ref 8.9–10.3)
Chloride: 103 mmol/L (ref 98–111)
Creatinine: 1.13 mg/dL (ref 0.61–1.24)
GFR, Estimated: >60 mL/min (ref >60)
Glucose, Bld: 114 mg/dL — ABNORMAL HIGH (ref 70–99)
Potassium: 3.9 mmol/L (ref 3.5–5.1)
Sodium: 137 mmol/L (ref 135–145)
Total Bilirubin: 1.4 mg/dL — ABNORMAL HIGH (ref 0.3–1.2)
Total Protein: 8.5 g/dL — ABNORMAL HIGH (ref 6.5–8.1)

## 2022-11-02 LAB — CBC WITH DIFFERENTIAL (CANCER CENTER ONLY)
Abs Immature Granulocytes: 0.08 10*3/uL — ABNORMAL HIGH (ref 0.00–0.07)
Basophils Absolute: 0.1 10*3/uL (ref 0.0–0.1)
Basophils Relative: 0 %
Eosinophils Absolute: 0.2 10*3/uL (ref 0.0–0.5)
Eosinophils Relative: 2 %
HCT: 45.6 % (ref 39.0–52.0)
Hemoglobin: 15.7 g/dL (ref 13.0–17.0)
Immature Granulocytes: 1 %
Lymphocytes Relative: 10 %
Lymphs Abs: 1.1 10*3/uL (ref 0.7–4.0)
MCH: 33.6 pg (ref 26.0–34.0)
MCHC: 34.4 g/dL (ref 30.0–36.0)
MCV: 97.6 fL (ref 80.0–100.0)
Monocytes Absolute: 0.6 10*3/uL (ref 0.1–1.0)
Monocytes Relative: 6 %
Neutro Abs: 9.4 10*3/uL — ABNORMAL HIGH (ref 1.7–7.7)
Neutrophils Relative %: 81 %
Platelet Count: 195 10*3/uL (ref 150–400)
RBC: 4.67 MIL/uL (ref 4.22–5.81)
RDW: 13.3 % (ref 11.5–15.5)
WBC Count: 11.5 10*3/uL — ABNORMAL HIGH (ref 4.0–10.5)
nRBC: 0 % (ref 0.0–0.2)

## 2022-11-02 LAB — LACTATE DEHYDROGENASE: LDH: 184 U/L (ref 98–192)

## 2022-11-02 NOTE — Progress Notes (Signed)
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 male returns to the clinic today for follow-up visit accompanied by his wife.  The patient is feeling fine today and recovering well from his recent left hip surgery.  The bone biopsy from the left hip showed no malignant cells.  He is currently undergoing rehabilitation.  He denied having any current chest pain, shortness of breath, cough or hemoptysis.  He has no nausea, vomiting, diarrhea or constipation.  He has no headache or visual changes.  He denied having any recent weight loss or night sweats.  He had myeloma panel performed recently that showed slightly elevated IgG but the serum light chains were unremarkable.  He was supposed to have a bone marrow biopsy and aspirate but this was delayed because of the hip surgery.  He is also scheduled to have bronchoscopy by Dr. Valeta Harms soon.  He is here for evaluation and recommendation regarding his condition.  MEDICAL HISTORY: Past Medical History:  Diagnosis Date   Atrial fibrillation ([place])    CAD (coronary artery disease)    a. s/p MI with LAD stent placement December, 1999 -last stress test November 2008 with no EKG evidence of ischemia  b. cath 07/23/2015 after abnormal ETT and nuc, occluded mid LAD stent with good distal collateral, medical therapy    Cardiomyopathy ([place])    Ischemic cardiomyopathy with EF 40% by echo 2005   Dyslipidemia    Erectile dysfunction    Hiatal hernia    with esophageal strictures and dilatations in the past - Dr. Delfin Edis   History of nuclear stress test    Myoview 12/16: EF 47%, anterior, anteroseptal and  inferoseptal defect suggesting infarct with peri-infarct ischemia, intermediate risk   HTN (hypertension)    Hx of seasonal allergies    Hyperlipidemia    Myocardial infarction ([place]) 08/01/1998   PAD (peripheral artery disease) ([place])    Pre-diabetes    Prostate cancer ([place])    with seed implant therapy following Lupron therapy in 2008 and 2009 - followed by Dr. Christinia Gully    Stroke ([place]) 04/20/2017   Vitamin D deficiency     ALLERGIES:  has No Known Allergies.  MEDICATIONS:  Current Outpatient Medications  Medication Sig Dispense Refill   amLODipine (NORVASC) 5 MG tablet Take 1 tablet (5 mg total) by mouth daily. 90 tablet 3   atorvastatin (LIPITOR) 40 MG tablet Take 40 mg by mouth at bedtime.     B Complex Vitamins (B COMPLEX 100) tablet Take 1 tablet by mouth daily. Balance     benazepril-hydrochlorthiazide (LOTENSIN HCT) 20-12.5 MG tablet Take 1 tablet by mouth daily. 90 tablet 3   cetirizine (ZYRTEC) 10 MG tablet Take 10 mg by mouth daily.     Cholecalciferol (VITAMIN D3) 2000 UNITS TABS Take 2,000 Units by mouth daily.     Coenzyme Q10 (CO Q-10) 100 MG CAPS Take 100 mg by mouth daily with supper.     enoxaparin (LOVENOX) 80 MG/0.8ML injection Inject 0.8 mLs (80 mg total) into the skin every 12 (twelve) hours. 13.6 mL 0   enoxaparin (LOVENOX) 80 MG/0.8ML injection Inject  0.8 mLs (80 mg total) into the skin every 12 (twelve) hours. 16 mL 1   metoprolol succinate (TOPROL XL) 25 MG 24 hr tablet Take 1 tablet (25 mg total) by mouth daily. 90 tablet 3   Multiple Vitamins-Minerals (CENTRUM) tablet Take 1 tablet by mouth daily.     nitroGLYCERIN (NITROSTAT) 0.4 MG SL tablet PLACE 1 TABLET UNDER THE TONGUE IF NEEDED EVERY 5 MINUTES FOR CHEST PAIN FOR 3 DOSES IF NO RELIEF AFTER FIRST DOSE CALL 911. 25 tablet 9   omeprazole (PRILOSEC) 20 MG capsule Take 20 mg by mouth daily before breakfast.     oxyCODONE (OXY IR/ROXICODONE) 5 MG immediate release tablet Take 1-2 tablets (5-10 mg  total) by mouth every 6 (six) hours as needed for moderate pain (pain score 4-6). 40 tablet 0   warfarin (COUMADIN) 5 MG tablet TAKE 1/2 TO 1 TABLET BY MOUTH DAILY AS DIRECTED BY COUMADIN CLINIC (Patient taking differently: Take 2.5-5 mg by mouth See admin instructions. Take 2.5 mg by mouth on Monday, Wednesday and Friday and take 5 mg on Tuesday, Thursday, Saturday and Sunday) 100 tablet 0   No current facility-administered medications for this visit.    SURGICAL HISTORY:  Past Surgical History:  Procedure Laterality Date   CARDIAC CATHETERIZATION N/A 07/23/2015   Procedure: Left Heart Cath and Coronary Angiography;  Surgeon: Belva Crome, MD;  Location: [facility];  Service: Cardiovascular;  Laterality: N/A;   CARDIOVERSION N/A 06/03/2022   Procedure: CARDIOVERSION;  Surgeon: Fay Records, MD;  Location: [facility];  Service: Cardiovascular;  Laterality: N/A;   COLONOSCOPY WITH PROPOFOL N/A 11/24/2015   Procedure: COLONOSCOPY WITH PROPOFOL;  Surgeon: Garlan Fair, MD;  Location: [facility];  Service: Endoscopy;  Laterality: N/A;   esophageal strictures with dilatations in the past per Dr. Delfin Edis     last '98   HERNIA REPAIR Left    INTRAMEDULLARY (IM) NAIL INTERTROCHANTERIC Left 10/25/2022   Procedure: LEFT INTRAMEDULLARY (IM) NAIL INTERTROCHANTERIC;  Surgeon: Leandrew Koyanagi, MD;  Location: [facility];  Service: Orthopedics;  Laterality: Left;   IR BONE TUMOR(S)RF ABLATION  10/11/2022   IR KYPHO THORACIC WITH BONE BIOPSY  10/11/2022   LAD stent placement - Dr. Daneen Schick     left inguinal hernia repair - Dr. Magdalene River     prostatic radioactive seed implantation - Dr. Valere Dross     '09    REVIEW OF SYSTEMS:  Constitutional: positive for fatigue Eyes: negative Ears, nose, mouth, throat, and face: negative Respiratory: negative Cardiovascular: negative Gastrointestinal: negative Genitourinary:negative Integument/breast: negative Hematologic/lymphatic:  negative Musculoskeletal:positive for bone pain Neurological: negative Behavioral/Psych: negative Endocrine: negative Allergic/Immunologic: negative   PHYSICAL EXAMINATION: General appearance: alert, cooperative, and no distress Head: Normocephalic, without obvious abnormality, atraumatic Neck: no adenopathy, no JVD, supple, symmetrical, trachea midline, and thyroid not enlarged, symmetric, no tenderness/mass/nodules Lymph nodes: Cervical, supraclavicular, and axillary nodes normal. Resp: clear to auscultation bilaterally Back: symmetric, no curvature. ROM normal. No CVA tenderness. Cardio: regular rate and rhythm, S1, S2 normal, no murmur, click, rub or gallop GI: soft, non-tender; bowel sounds normal; no masses,  no organomegaly Extremities: extremities normal, atraumatic, no cyanosis or edema Neurologic: Alert and oriented X 3, normal strength and tone. Normal symmetric reflexes. Normal coordination and gait  ECOG PERFORMANCE STATUS: 1 - Symptomatic but completely ambulatory  Blood pressure 110/85, pulse 62, temperature 98 F (36.7 C), temperature source Oral, resp. rate 16, weight 190 lb 14.4 oz (86.6 kg), SpO2 100 %.  LABORATORY DATA: Lab Results  Component Value Date   WBC 11.5 (H) 11/02/2022   HGB 15.7 11/02/2022   HCT 45.6 11/02/2022   MCV 97.6 11/02/2022   PLT 195 11/02/2022      Chemistry      Component Value Date/Time   NA 134 (L) 10/25/2022 0623   NA 141 08/25/2022 1440   K 3.5 10/25/2022 0623   CL 102 10/25/2022 0623   CO2 25 10/25/2022 0623   BUN 19 10/25/2022 0623   BUN 14 08/25/2022 1440   CREATININE 1.12 10/25/2022 0623   CREATININE 1.07 10/05/2022 1112   CREATININE 1.23 (H) 07/21/2015 0917      Component Value Date/Time   CALCIUM 9.1 10/25/2022 0623   ALKPHOS 69 10/05/2022 1112   AST 25 10/05/2022 1112   ALT 31 10/05/2022 1112   BILITOT 1.7 (H) 10/05/2022 1112       RADIOGRAPHIC STUDIES: DG FEMUR MIN 2 VIEWS LEFT  Result Date:  10/25/2022 CLINICAL DATA:  Left femoral nail placement due to lytic bone lesion in the femoral neck EXAM: LEFT FEMUR 2 VIEWS; DG C-ARM 1-60 MIN-NO REPORT COMPARISON:  PET-CT 09/19/2022 FINDINGS: Four C-arm fluoroscopic images were obtained intraoperatively and submitted for post operative interpretation. Long intramedullary rod with proximal lag screws traverse lytic lesion in the left femoral neck. Hardware appears appropriately positioned. No perihardware fracture. 1 minute 14 seconds fluoroscopy time utilized. Radiation dose: 24.94 mGy. Please see the performing provider's procedural report for further detail. IMPRESSION: Fluoroscopic guidance provided for left femoral nail placement. Electronically Signed   By: Davina Poke D.O.   On: 10/25/2022 08:58   DG C-Arm 1-60 Min-No Report  Result Date: 10/25/2022 CLINICAL DATA:  Left femoral nail placement due to lytic bone lesion in the femoral neck EXAM: LEFT FEMUR 2 VIEWS; DG C-ARM 1-60 MIN-NO REPORT COMPARISON:  PET-CT 09/19/2022 FINDINGS: Four C-arm fluoroscopic images were obtained intraoperatively and submitted for post operative interpretation. Long intramedullary rod with proximal lag screws traverse lytic lesion in the left femoral neck. Hardware appears appropriately positioned. No perihardware fracture. 1 minute 14 seconds fluoroscopy time utilized. Radiation dose: 24.94 mGy. Please see the performing provider's procedural report for further detail. IMPRESSION: Fluoroscopic guidance provided for left femoral nail placement. Electronically Signed   By: Davina Poke D.O.   On: 10/25/2022 08:58   XR HIP UNILAT W OR W/O PELVIS 2-3 VIEWS LEFT  Result Date: 10/19/2022 AP pelvis lateral view left hip reviewed.  Lytic lesion present in the middle of the femoral neck.  No cortical involvement.  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 present  IR Bone Tumor(s)RF Ablation  Result Date: 10/11/2022 INDICATION: 82 year old male with  hypermetabolic [id number] vertebral body lytic lesion concerning for metastatic disease. Multiple pulmonary nodules and left femoral neck hypermetabolic lesion are also identified on prior studies. He comes today for a core bone biopsy followed by radiofrequency ablation and kyphoplasty. EXAM: T10 VERTEBRAL BODY CORE BIOPSY, RADIOFREQUENCY ABLATION AND BALLOON KYPHOPLASTY COMPARISON:  MRI of the thoracic spine September 27, 2022. MEDICATIONS: Ancef 2 g IV; The antibiotic was administered in an appropriate time interval prior to needle puncture of the skin. ANESTHESIA/SEDATION: Total intra-service Moderate Sedation Time: 54 minute. A total of 2 mg of Versed and 100 mcg of fentanyl were administered intravenously by the radiology nurse low assisted in monitoring the patients level of consciousness and vital signs continuously throughout the procedure under my direct supervision. FLUOROSCOPY: Radiation Exposure Index (as provided by the fluoroscopic  device): XX123456 mGy Kerma COMPLICATIONS: None immediate. TECHNIQUE: Informed written consent was obtained from the patient after a thorough discussion of the procedural risks, benefits and alternatives. All questions were addressed. Maximal Sterile Barrier Technique was utilized including caps, mask, sterile gowns, sterile gloves, sterile drape, hand hygiene and skin antiseptic. A timeout was performed prior to the initiation of the procedure. The patient was placed in prone position on the angiography table. The thoracic region was prepped and draped in a sterile fashion. Under fluoroscopy, the T10 vertebral body was delineated and the skin area was marked. The skin was infiltrated with a 1% Lidocaine approximately 3.5 cm lateral to the spinous process projection on the right. Using a 22-gauge spinal needle, the soft issue and the peripedicular space and periosteum were infiltrated with Bupivacaine 0.5%. A skin incision was made at the access site. Subsequently, an 11-gauge Kyphon  trocar was inserted under fluoroscopic guidance until contact with the pedicle was obtained. The trocar was inserted into the pedicle with light hammer tapping until the posterior boundaries of the vertebral body was reached. The diamond mandrill was removed and one core biopsy was obtained. The skin was infiltrated with a 1% Lidocaine approximately 3 cm lateral to the spinous process projection on the left. Using a 22-gauge spinal needle, the soft issue and the peripedicular space and periosteum were infiltrated with Bupivacaine 0.5%. A skin incision was made at the access site. Subsequently, an 11-gauge Kyphon trocar was inserted under fluoroscopic guidance until contact with the pedicle was obtained. The trocar was inserted with light hammer tapping into the pedicle until the posterior boundaries of the vertebral body was reached. The diamond mandrill was removed and one core biopsy was obtained. A bone drill was coaxially advanced within the anterior third of the vertebral body on each side for proper RF probes size selection. An OsteoCool RF probe was inserted bilaterally within the mid-posterior third of the vertebral body. The radiofrequency ablation cycle was performed. Thereafter, the RF probes were exchanged for inflatable Kyphon balloons, which were centered within the mid-aspect of the vertebral body. The balloons were inflated to create a void to serve as a repository for the bone cement. Both balloons were deflated and through both cannulas, under continuous fluoroscopy guidance in the AP and lateral views, the vertebral body was filled with previously mixed polymethyl-methacrylate (PMMA) added to barium for opacification. Both cannulas were later removed. FINDINGS: Postprocedural radiographic images showed cement distribution within the anterior 2/3 of the vertebral body. IMPRESSION: Successful and uncomplicated [id number] vertebral body core bone biopsy followed by radiofrequency ablation and balloon  kyphoplasty for treatment of hypermetabolic lytic lesion. Core biopsy samples were sent to the laboratory for tissue exam. Electronically Signed   By: Pedro Earls M.D.   On: 10/11/2022 14:33   IR KYPHO THORACIC WITH BONE BIOPSY  Result Date: 10/11/2022 INDICATION: 82 year old male with hypermetabolic [id number] vertebral body lytic lesion concerning for metastatic disease. Multiple pulmonary nodules and left femoral neck hypermetabolic lesion are also identified on prior studies. He comes today for a core bone biopsy followed by radiofrequency ablation and kyphoplasty. EXAM: T10 VERTEBRAL BODY CORE BIOPSY, RADIOFREQUENCY ABLATION AND BALLOON KYPHOPLASTY COMPARISON:  MRI of the thoracic spine September 27, 2022. MEDICATIONS: Ancef 2 g IV; The antibiotic was administered in an appropriate time interval prior to needle puncture of the skin. ANESTHESIA/SEDATION: Total intra-service Moderate Sedation Time: 54 minute. A total of 2 mg of Versed and 100 mcg of fentanyl were administered intravenously by the  radiology nurse low assisted in monitoring the patients level of consciousness and vital signs continuously throughout the procedure under my direct supervision. FLUOROSCOPY: Radiation Exposure Index (as provided by the fluoroscopic device): XX123456 mGy Kerma COMPLICATIONS: None immediate. TECHNIQUE: Informed written consent was obtained from the patient after a thorough discussion of the procedural risks, benefits and alternatives. All questions were addressed. Maximal Sterile Barrier Technique was utilized including caps, mask, sterile gowns, sterile gloves, sterile drape, hand hygiene and skin antiseptic. A timeout was performed prior to the initiation of the procedure. The patient was placed in prone position on the angiography table. The thoracic region was prepped and draped in a sterile fashion. Under fluoroscopy, the T10 vertebral body was delineated and the skin area was marked. The skin was  infiltrated with a 1% Lidocaine approximately 3.5 cm lateral to the spinous process projection on the right. Using a 22-gauge spinal needle, the soft issue and the peripedicular space and periosteum were infiltrated with Bupivacaine 0.5%. A skin incision was made at the access site. Subsequently, an 11-gauge Kyphon trocar was inserted under fluoroscopic guidance until contact with the pedicle was obtained. The trocar was inserted into the pedicle with light hammer tapping until the posterior boundaries of the vertebral body was reached. The diamond mandrill was removed and one core biopsy was obtained. The skin was infiltrated with a 1% Lidocaine approximately 3 cm lateral to the spinous process projection on the left. Using a 22-gauge spinal needle, the soft issue and the peripedicular space and periosteum were infiltrated with Bupivacaine 0.5%. A skin incision was made at the access site. Subsequently, an 11-gauge Kyphon trocar was inserted under fluoroscopic guidance until contact with the pedicle was obtained. The trocar was inserted with light hammer tapping into the pedicle until the posterior boundaries of the vertebral body was reached. The diamond mandrill was removed and one core biopsy was obtained. A bone drill was coaxially advanced within the anterior third of the vertebral body on each side for proper RF probes size selection. An OsteoCool RF probe was inserted bilaterally within the mid-posterior third of the vertebral body. The radiofrequency ablation cycle was performed. Thereafter, the RF probes were exchanged for inflatable Kyphon balloons, which were centered within the mid-aspect of the vertebral body. The balloons were inflated to create a void to serve as a repository for the bone cement. Both balloons were deflated and through both cannulas, under continuous fluoroscopy guidance in the AP and lateral views, the vertebral body was filled with previously mixed polymethyl-methacrylate (PMMA)  added to barium for opacification. Both cannulas were later removed. FINDINGS: Postprocedural radiographic images showed cement distribution within the anterior 2/3 of the vertebral body. IMPRESSION: Successful and uncomplicated [id number] vertebral body core bone biopsy followed by radiofrequency ablation and balloon kyphoplasty for treatment of hypermetabolic lytic lesion. Core biopsy samples were sent to the laboratory for tissue exam. Electronically Signed   By: Pedro Earls M.D.   On: 10/11/2022 14:33    ASSESSMENT AND PLAN: This is a very pleasant 82 years old white male with highly suspicious multiple myeloma presented with multiple lytic lesions involving T10, left femoral neck as well as T4, T9 and T11 vertebral bodies. 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 The biopsy from the left hip was negative for malignancy. His myeloma panel showed no concerning findings except for elevated IgG but the serum light chains were  normal. The patient underwent left hip fixation for impending fracture. He also underwent palliative radiotherapy under the care of Dr. Tammi Klippel. I recommended for the patient to proceed with a bone marrow biopsy and aspirate to rule out the presence of multiple myeloma versus plasmacytoma. He will also have bronchoscopy with biopsy of the lung lesion. I will see him back for follow-up visit in 3 weeks for evaluation and discussion of his treatment plan based on the upcoming biopsies. The patient had several questions today and I answered them completely to his satisfaction. He was advised to call immediately if he has any other concerning symptoms in the interval. The patient voices understanding of current disease status and treatment options and is in agreement with the current care plan.  All questions were answered. The patient knows to call the  clinic with any problems, questions or concerns. We can certainly see the patient much sooner if necessary.  The total time spent in the appointment was 30 minutes.  Disclaimer: This note was dictated with voice recognition software. Similar sounding words can inadvertently be transcribed and may not be corrected upon review.

## 2022-11-02 NOTE — Telephone Encounter (Signed)
Pt called into clinic left VM message, returned call to pt and his wife (both were on the phone).  Pt states he is having a robotic bronchoscopy on 11/15/22 and is holding Warfarin prior to procedure and being bridged with Lovenox.  Pt saw Dr Earlie Server yesterday in oncology and he wanted pt to have a bone marrow biopsy done since pt was already holding Warfarin for robotic bronchoscopy.  Bone marrow biopsy will be done on 11/17/22 by interventional radiology.  Pt states he was advised it was ok to resume Warfarin as instructed by our clinic on 11/15/22 after bronchoscopy.  Pt wanting to know about Lovenox injections and since they are resuming on 11/16/22 and taking at 8 am and 8pm, what they should do about Lovenox the am of procedure since they have to be at the hospital at [place] pt and his wife I wanted to call Interventional radiology and clarify pt could be on both Warfarin and Lovenox for the bone marrow biopsy and then I would call them back to clarify instructions as to when to take the Lovenox injections.

## 2022-11-02 NOTE — Telephone Encounter (Addendum)
Called pt and his wife back, advised then to follow the written Lovenox instructions that were given to them at their anticoagulation appt on 10/31/22, with the exception of not to take the 8am Lovenox injection on 11/17/22 prior to them having to arrive at the hospital at [facility] for the bone marrow biopsy, and then to just resume the Lovenox injections as previously instructed on 11/17/22 at 8pm per my discussion with Nehemiah Massed, pharmacist.  Pt and wife verbalized understanding and thanked me for the clarification.

## 2022-11-02 NOTE — Telephone Encounter (Addendum)
Spoke with Mylinda Latina, NT scheduler 3 with interventional radiology.  She scheduled pt's bone marrow biopsy and contacted pt with instructions previously.  She states she asked provider directly if pt needed to hold Warfarin or Lovenox and she was advised pt's do not need to hold any blood thinners prior to a bone marrow biopsy.  She understands and clarified ok for pt to be on both Warfarin and Lovenox at time of procedure. Called spoke with Nehemiah Massed, pharmacist Advised of above recommendations, since pt is taking Lovenox BID we are going to have pt not take his 8am dosage of Lovenox on his procedure day of 11/17/22 prior to bone marrow biopsy and then resume Lovenox that evening after procedure at 8pm.

## 2022-11-07 ENCOUNTER — Other Ambulatory Visit: Payer: Self-pay

## 2022-11-07 MED ORDER — BENAZEPRIL-HYDROCHLOROTHIAZIDE 20-12.5 MG PO TABS: 1.0000 | ORAL_TABLET | Freq: Every day | ORAL | 2 refills | Status: DC

## 2022-11-07 MED ORDER — AMLODIPINE BESYLATE 5 MG PO TABS: 5.0000 mg | ORAL_TABLET | Freq: Every day | ORAL | 2 refills | Status: DC

## 2022-11-08 ENCOUNTER — Ambulatory Visit (INDEPENDENT_AMBULATORY_CARE_PROVIDER_SITE_OTHER): Payer: Medicare Other | Admitting: Physician Assistant

## 2022-11-08 ENCOUNTER — Other Ambulatory Visit (INDEPENDENT_AMBULATORY_CARE_PROVIDER_SITE_OTHER): Payer: Medicare Other

## 2022-11-08 ENCOUNTER — Telehealth: Payer: Self-pay | Admitting: *Deleted

## 2022-11-08 DIAGNOSIS — Z9189 Other specified personal risk factors, not elsewhere classified: Secondary | ICD-10-CM | POA: Diagnosis not present

## 2022-11-08 DIAGNOSIS — C801 Malignant (primary) neoplasm, unspecified: Secondary | ICD-10-CM | POA: Diagnosis not present

## 2022-11-08 DIAGNOSIS — C7951 Secondary malignant neoplasm of bone: Secondary | ICD-10-CM

## 2022-11-08 NOTE — Progress Notes (Signed)
Post-Op Visit Note   Patient: Ronald Duran           Date of Birth: 1941-05-14           MRN: [id number] Visit Date: 11/08/2022 PCP: Corliss Blacker, MD   Assessment & Plan:  Chief Complaint:  Chief Complaint  Patient presents with   Left Hip - Routine Post Op   Visit Diagnoses:  1. 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 Impending pathologic fracture     Plan: Patient is a pleasant 82 year old gentleman who comes in today 2 weeks status post left hip IM nail from an impending pathologic femoral neck fracture, date of surgery 10/25/2022.  Intraoperative reamings were negative for malignancy.  Patient has been doing well.  He is getting home health physical therapy and is ambulating with a walker.  He is taking Tylenol for pain.  Examination of left hip reveals well-healed surgical incisions with staples intact.  Calves are soft nontender.  He is neurovascular intact distally.  Today, staples were removed and Steri-Strips applied.  Continue with PT.  Follow-up in 4 weeks for repeat evaluation and left femur x-rays.  Call with concerns or questions.  Follow-Up Instructions: Return in about 4 weeks (around 12/06/2022).   Orders:  Orders Placed This Encounter  Procedures   XR HIP UNILAT W OR W/O PELVIS 2-3 VIEWS LEFT   No orders of the defined types were placed in this encounter.   Imaging: No results found.  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   Abnormal involuntary movement 09/13/2016   Bronchitis 09/13/2016   Esophageal stricture 09/13/2016   History of adenomatous polyp of  colon 09/13/2016   Inflamed seborrheic keratosis 09/13/2016   Long term (current) use of anticoagulants 09/13/2016   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 s/p MI with LAD stent placement December, 1999 -last stress test November 2008 with no EKG evidence of ischemia  b. cath 07/23/2015 after abnormal ETT and nuc, occluded mid LAD stent with good distal collateral, medical therapy    Cardiomyopathy ([place])    Ischemic cardiomyopathy with EF 40% by echo 2005   Dyslipidemia    Erectile dysfunction    Hiatal hernia    with esophageal strictures and dilatations in the past - Dr. Delfin Edis   History of nuclear stress test    Myoview 12/16: EF 47%, anterior, anteroseptal and inferoseptal defect suggesting infarct with peri-infarct ischemia, intermediate risk   HTN (hypertension)    Hx of seasonal allergies    Hyperlipidemia    Myocardial infarction ([place]) 08/01/1998   PAD (peripheral artery disease) ([place])    Pre-diabetes    Prostate cancer ([place])    with seed implant therapy  following Lupron therapy in 2008 and 2009 - followed by Dr. Christinia Gully    Stroke [facility]) 04/20/2017   Vitamin D deficiency     Family History  Problem Relation Age of Onset   CVA Mother    CVA Father 63   CAD Sister     Past Surgical History:  Procedure Laterality Date   CARDIAC CATHETERIZATION N/A 07/23/2015   Procedure: Left Heart Cath and Coronary Angiography;  Surgeon: Belva Crome, MD;  Location: [facility];  Service: Cardiovascular;  Laterality: N/A;   CARDIOVERSION N/A 06/03/2022   Procedure: CARDIOVERSION;  Surgeon: Fay Records, MD;  Location: [facility];  Service: Cardiovascular;   Laterality: N/A;   COLONOSCOPY WITH PROPOFOL N/A 11/24/2015   Procedure: COLONOSCOPY WITH PROPOFOL;  Surgeon: Garlan Fair, MD;  Location: [facility];  Service: Endoscopy;  Laterality: N/A;   esophageal strictures with dilatations in the past per Dr. Delfin Edis     last '98   HERNIA REPAIR Left    INTRAMEDULLARY (IM) NAIL INTERTROCHANTERIC Left 10/25/2022   Procedure: LEFT INTRAMEDULLARY (IM) NAIL INTERTROCHANTERIC;  Surgeon: Leandrew Koyanagi, MD;  Location: [facility];  Service: Orthopedics;  Laterality: Left;   IR BONE TUMOR(S)RF ABLATION  10/11/2022   IR KYPHO THORACIC WITH BONE BIOPSY  10/11/2022   LAD stent placement - Dr. Daneen Schick     left inguinal hernia repair - Dr. Magdalene River     prostatic radioactive seed implantation - Dr. Valere Dross     '09   Social History   Occupational History   Not on file  Tobacco Use   Smoking status: Never   Smokeless tobacco: Never  Vaping Use   Vaping Use: Never used  Substance and Sexual Activity   Alcohol use: Not Currently   Drug use: No   Sexual activity: Not Currently

## 2022-11-08 NOTE — Telephone Encounter (Signed)
Pt's warfarin brand is changing from [place] to [employer] per [facility]. Pt was receiving meds from will [facility] brand and now will be using Amneal with [facility]. Advised will continue monitor closely since can alter results. Pt starting a lovenox bridge soon so will note this on his next appt.

## 2022-11-09 ENCOUNTER — Other Ambulatory Visit: Payer: Self-pay | Admitting: Nurse Practitioner

## 2022-11-10 ENCOUNTER — Encounter: Payer: Self-pay | Admitting: Pulmonary Disease

## 2022-11-10 ENCOUNTER — Encounter (HOSPITAL_COMMUNITY): Payer: Self-pay | Admitting: Pulmonary Disease

## 2022-11-10 ENCOUNTER — Other Ambulatory Visit: Payer: Self-pay

## 2022-11-10 NOTE — Progress Notes (Signed)
Spoke with pt for pre-op call. Pt recently had surgery in March, states he is doing well. Pt has hx of a stroke and was put on Warfarin at that time. He also has hx of A-fib. His last dose for Coumadin was yesterday, 11/09/22 and he starts Lovenox tomorrow with his last dose to be Monday, 11/14/22.   Pt states he has the CHG soap from his surgery in March and will shower Monday PM and Tuesday AM.

## 2022-11-11 ENCOUNTER — Ambulatory Visit (HOSPITAL_BASED_OUTPATIENT_CLINIC_OR_DEPARTMENT_OTHER)
Admission: RE | Admit: 2022-11-11 | Discharge: 2022-11-11 | Disposition: A | Discharge status: Home / Self Care | Payer: Medicare Other | Source: Ambulatory Visit | Attending: Pulmonary Disease | Admitting: Pulmonary Disease

## 2022-11-11 ENCOUNTER — Other Ambulatory Visit: Payer: Medicare Other

## 2022-11-11 DIAGNOSIS — R911 Solitary pulmonary nodule: Secondary | ICD-10-CM

## 2022-11-11 NOTE — Progress Notes (Signed)
CT reviewed prior to bronchoscopy   Thanks,  BLI  [name], DO [facility] 11/11/2022 4:57 PM

## 2022-11-14 NOTE — Anesthesia Preprocedure Evaluation (Signed)
Anesthesia Evaluation  Patient identified by MRN, date of birth, ID band Patient awake    Reviewed: Allergy & Precautions, NPO status , Patient's Chart, lab work & pertinent test results  Airway Mallampati: II  TM Distance: >3 FB Neck ROM: Full    Dental no notable dental hx. (+) Teeth Intact, Dental Advisory Given   Pulmonary neg pulmonary ROS   Pulmonary exam normal breath sounds clear to auscultation       Cardiovascular hypertension, + CAD, + Past MI, + Cardiac Stents and + Peripheral Vascular Disease  Normal cardiovascular exam+ dysrhythmias Atrial Fibrillation  Rhythm:Regular Rate:Normal  TTE 2023 1. Left ventricular ejection fraction, by estimation, is 45 to 50%. The  left ventricle has mildly decreased function. The left ventricle  demonstrates regional wall motion abnormalities (see scoring  diagram/findings for description). Left ventricular  diastolic parameters are indeterminate. There is moderate hypokinesis of  the left ventricular, mid-apical inferior wall. There is of the left  ventricular, apical apical segment.   2. Right ventricular systolic function is normal. The right ventricular  size is normal. There is normal pulmonary artery systolic pressure.   3. Left atrial size was moderately dilated.   4. Right atrial size was mild to moderately dilated.   5. The mitral valve is normal in structure. Trivial mitral valve  regurgitation. No evidence of mitral stenosis.   6. The aortic valve is tricuspid. There is mild calcification of the  aortic valve. Aortic valve regurgitation is trivial. Aortic valve  sclerosis is present, with no evidence of aortic valve stenosis.   7. The inferior vena cava is normal in size with greater than 50%  respiratory variability, suggesting right atrial pressure of 3 mmHg.      Neuro/Psych CVA  negative psych ROS   GI/Hepatic Neg liver ROS, hiatal hernia,GERD  ,,  Endo/Other   negative endocrine ROS    Renal/GU negative Renal ROS  negative genitourinary   Musculoskeletal negative musculoskeletal ROS (+)    Abdominal   Peds  Hematology negative hematology ROS (+)   Anesthesia Other Findings   Reproductive/Obstetrics                             Anesthesia Physical Anesthesia Plan  ASA: 3  Anesthesia Plan: General   Post-op Pain Management: Minimal or no pain anticipated   Induction: Intravenous  PONV Risk Score and Plan: 2 and Dexamethasone, Ondansetron and Treatment may vary due to age or medical condition  Airway Management Planned: Oral ETT  Additional Equipment:   Intra-op Plan:   Post-operative Plan: Extubation in OR  Informed Consent: I have reviewed the patients History and Physical, chart, labs and discussed the procedure including the risks, benefits and alternatives for the proposed anesthesia with the patient or authorized representative who has indicated his/her understanding and acceptance.     Dental advisory given  Plan Discussed with: CRNA  Anesthesia Plan Comments:        Anesthesia Quick Evaluation

## 2022-11-15 ENCOUNTER — Encounter (HOSPITAL_COMMUNITY)
Admission: RE | Disposition: A | Discharge status: Home / Self Care | Payer: Self-pay | Source: Home / Self Care | Attending: Pulmonary Disease

## 2022-11-15 ENCOUNTER — Ambulatory Visit (HOSPITAL_COMMUNITY): Payer: Medicare Other

## 2022-11-15 ENCOUNTER — Ambulatory Visit (HOSPITAL_BASED_OUTPATIENT_CLINIC_OR_DEPARTMENT_OTHER): Payer: Medicare Other | Admitting: Certified Registered Nurse Anesthetist

## 2022-11-15 ENCOUNTER — Ambulatory Visit (HOSPITAL_COMMUNITY): Payer: Medicare Other | Admitting: Certified Registered Nurse Anesthetist

## 2022-11-15 ENCOUNTER — Ambulatory Visit (HOSPITAL_COMMUNITY)
Admission: RE | Admit: 2022-11-15 | Discharge: 2022-11-15 | Disposition: A | Discharge status: Home / Self Care | Payer: Medicare Other | Attending: Pulmonary Disease | Admitting: Pulmonary Disease

## 2022-11-15 DIAGNOSIS — I255 Ischemic cardiomyopathy: Secondary | ICD-10-CM | POA: Diagnosis not present

## 2022-11-15 DIAGNOSIS — K219 Gastro-esophageal reflux disease without esophagitis: Secondary | ICD-10-CM | POA: Insufficient documentation

## 2022-11-15 DIAGNOSIS — E785 Hyperlipidemia, unspecified: Secondary | ICD-10-CM | POA: Insufficient documentation

## 2022-11-15 DIAGNOSIS — E559 Vitamin D deficiency, unspecified: Secondary | ICD-10-CM | POA: Insufficient documentation

## 2022-11-15 DIAGNOSIS — Z955 Presence of coronary angioplasty implant and graft: Secondary | ICD-10-CM | POA: Insufficient documentation

## 2022-11-15 DIAGNOSIS — R918 Other nonspecific abnormal finding of lung field: Secondary | ICD-10-CM | POA: Insufficient documentation

## 2022-11-15 DIAGNOSIS — C9000 Multiple myeloma not having achieved remission: Secondary | ICD-10-CM | POA: Diagnosis not present

## 2022-11-15 DIAGNOSIS — I252 Old myocardial infarction: Secondary | ICD-10-CM | POA: Diagnosis not present

## 2022-11-15 DIAGNOSIS — I739 Peripheral vascular disease, unspecified: Secondary | ICD-10-CM | POA: Insufficient documentation

## 2022-11-15 DIAGNOSIS — I2510 Atherosclerotic heart disease of native coronary artery without angina pectoris: Secondary | ICD-10-CM

## 2022-11-15 DIAGNOSIS — K449 Diaphragmatic hernia without obstruction or gangrene: Secondary | ICD-10-CM | POA: Insufficient documentation

## 2022-11-15 DIAGNOSIS — Z85828 Personal history of other malignant neoplasm of skin: Secondary | ICD-10-CM | POA: Diagnosis not present

## 2022-11-15 DIAGNOSIS — R911 Solitary pulmonary nodule: Secondary | ICD-10-CM | POA: Diagnosis present

## 2022-11-15 DIAGNOSIS — I10 Essential (primary) hypertension: Secondary | ICD-10-CM | POA: Insufficient documentation

## 2022-11-15 DIAGNOSIS — Z85820 Personal history of malignant melanoma of skin: Secondary | ICD-10-CM | POA: Diagnosis not present

## 2022-11-15 DIAGNOSIS — Z8546 Personal history of malignant neoplasm of prostate: Secondary | ICD-10-CM | POA: Diagnosis not present

## 2022-11-15 DIAGNOSIS — Z8673 Personal history of transient ischemic attack (TIA), and cerebral infarction without residual deficits: Secondary | ICD-10-CM | POA: Diagnosis not present

## 2022-11-15 DIAGNOSIS — I4891 Unspecified atrial fibrillation: Secondary | ICD-10-CM | POA: Insufficient documentation

## 2022-11-15 HISTORY — PX: BRONCHIAL BIOPSY: SHX5109

## 2022-11-15 HISTORY — PX: BRONCHIAL BRUSHINGS: SHX5108

## 2022-11-15 HISTORY — DX: Gastro-esophageal reflux disease without esophagitis: K21.9

## 2022-11-15 HISTORY — PX: FIDUCIAL MARKER PLACEMENT: SHX6858

## 2022-11-15 HISTORY — PX: BRONCHIAL NEEDLE ASPIRATION BIOPSY: SHX5106

## 2022-11-15 LAB — APTT: aPTT: 27 seconds (ref 24–36)

## 2022-11-15 LAB — SPECIMEN STATUS REPORT

## 2022-11-15 LAB — PROTIME-INR
INR: 1.1 (ref 0.8–1.2)
Prothrombin Time: 13.8 seconds (ref 11.4–15.2)

## 2022-11-15 LAB — NOVEL CORONAVIRUS, NAA: SARS-CoV-2, NAA: NOT DETECTED

## 2022-11-15 SURGERY — BRONCHOSCOPY, WITH BIOPSY USING ELECTROMAGNETIC NAVIGATION
Anesthesia: General | Laterality: Right

## 2022-11-15 MED ORDER — DEXAMETHASONE SODIUM PHOSPHATE 10 MG/ML IJ SOLN
INTRAMUSCULAR | Status: DC | PRN
  Administered 2022-11-15: 10 mg via INTRAVENOUS

## 2022-11-15 MED ORDER — PHENYLEPHRINE 80 MCG/ML (10ML) SYRINGE FOR IV PUSH (FOR BLOOD PRESSURE SUPPORT)
PREFILLED_SYRINGE | INTRAVENOUS | Status: DC | PRN
  Administered 2022-11-15 (×2): 160 ug via INTRAVENOUS

## 2022-11-15 MED ORDER — LACTATED RINGERS IV SOLN: INTRAVENOUS | Status: DC

## 2022-11-15 MED ORDER — LIDOCAINE 2% (20 MG/ML) 5 ML SYRINGE
INTRAMUSCULAR | Status: DC | PRN
  Administered 2022-11-15: 60 mg via INTRAVENOUS

## 2022-11-15 MED ORDER — PROPOFOL 10 MG/ML IV BOLUS
INTRAVENOUS | Status: DC | PRN
  Administered 2022-11-15: 100 mg via INTRAVENOUS
  Administered 2022-11-15: 30 mg via INTRAVENOUS

## 2022-11-15 MED ORDER — FENTANYL CITRATE (PF) 100 MCG/2ML IJ SOLN: 25.0000 ug | INTRAMUSCULAR | Status: DC | PRN

## 2022-11-15 MED ORDER — ROCURONIUM BROMIDE 10 MG/ML (PF) SYRINGE
PREFILLED_SYRINGE | INTRAVENOUS | Status: DC | PRN
  Administered 2022-11-15: 50 mg via INTRAVENOUS

## 2022-11-15 MED ORDER — SUGAMMADEX SODIUM 200 MG/2ML IV SOLN
INTRAVENOUS | Status: DC | PRN
  Administered 2022-11-15: 200 mg via INTRAVENOUS

## 2022-11-15 MED ORDER — FENTANYL CITRATE (PF) 250 MCG/5ML IJ SOLN
INTRAMUSCULAR | Status: DC | PRN
  Administered 2022-11-15 (×2): 50 ug via INTRAVENOUS

## 2022-11-15 MED ORDER — PHENYLEPHRINE HCL-NACL 20-0.9 MG/250ML-% IV SOLN
INTRAVENOUS | Status: DC | PRN
  Administered 2022-11-15: 20 ug/min via INTRAVENOUS

## 2022-11-15 MED ORDER — ONDANSETRON HCL 4 MG/2ML IJ SOLN
INTRAMUSCULAR | Status: DC | PRN
  Administered 2022-11-15: 4 mg via INTRAVENOUS

## 2022-11-15 MED ORDER — CHLORHEXIDINE GLUCONATE 0.12 % MT SOLN
OROMUCOSAL | Status: AC
  Filled 2022-11-15: qty 15

## 2022-11-15 MED ORDER — PROPOFOL 500 MG/50ML IV EMUL
INTRAVENOUS | Status: DC | PRN
  Administered 2022-11-15: 150 ug/kg/min via INTRAVENOUS

## 2022-11-15 SURGICAL SUPPLY — 1 items: fiducial IMPLANT

## 2022-11-15 NOTE — Anesthesia Procedure Notes (Signed)
Procedure Name: Intubation Date/Time: 11/15/2022 7:42 AM  Performed by: Nadara Mustard, CRNAPre-anesthesia Checklist: Patient identified, Emergency Drugs available, Suction available and Patient being monitored Patient Re-evaluated:Patient Re-evaluated prior to induction Oxygen Delivery Method: Circle system utilized Preoxygenation: Pre-oxygenation with 100% oxygen Induction Type: IV induction Ventilation: Mask ventilation without difficulty Laryngoscope Size: Mac and 4 Grade View: Grade I Tube type: Oral Tube size: 8.5 mm Number of attempts: 1 Airway Equipment and Method: Stylet and Oral airway Placement Confirmation: ETT inserted through vocal cords under direct vision, positive ETCO2 and breath sounds checked- equal and bilateral Tube secured with: Tape Dental Injury: Teeth and Oropharynx as per pre-operative assessment

## 2022-11-15 NOTE — Anesthesia Postprocedure Evaluation (Signed)
Anesthesia Post Note  Patient: Ronald Duran  Procedure(s) Performed: ROBOTIC ASSISTED NAVIGATIONAL BRONCHOSCOPY (Right) BRONCHIAL NEEDLE ASPIRATION BIOPSIES BRONCHIAL BIOPSIES FIDUCIAL MARKER PLACEMENT BRONCHIAL BRUSHINGS     Patient location during evaluation: PACU Anesthesia Type: General Level of consciousness: awake and alert Pain management: pain level controlled Vital Signs Assessment: post-procedure vital signs reviewed and stable Respiratory status: spontaneous breathing, nonlabored ventilation, respiratory function stable and patient connected to nasal cannula oxygen Cardiovascular status: blood pressure returned to baseline and stable Postop Assessment: no apparent nausea or vomiting Anesthetic complications: no  No notable events documented.  Last Vitals:  Vitals:   11/15/22 0900 11/15/22 0912  BP: 110/84 121/84  Pulse: 74 77  Resp: 12 19  Temp:    SpO2: 98% 97%    Last Pain:  Vitals:   11/15/22 0900  TempSrc:   PainSc: 0-No pain                 Guinn Delarosa L Emilyanne Mcgough

## 2022-11-15 NOTE — Op Note (Signed)
Video Bronchoscopy with Robotic Assisted Bronchoscopic Navigation   Date of Operation: 11/15/2022   Pre-op Diagnosis: Pulmonary nodule  Post-op Diagnosis: Pulmonary nodule  Surgeon: [name], DO   Assistants: None  Anesthesia: General endotracheal anesthesia  Operation: Flexible video fiberoptic bronchoscopy with robotic assistance and biopsies.  Estimated Blood Loss: Minimal  Complications: None  Indications and History: Ronald Duran is a 82 y.o. male with history of right lower lobe pulmonary nodule. The risks, benefits, complications, treatment options and expected outcomes were discussed with the patient.  The possibilities of pneumothorax, pneumonia, reaction to medication, pulmonary aspiration, perforation of a viscus, bleeding, failure to diagnose a condition and creating a complication requiring transfusion or operation were discussed with the patient who freely signed the consent.    Description of Procedure: The patient was seen in the Preoperative Area, was examined and was deemed appropriate to proceed.  The patient was taken to [facility] endoscopy room 3, identified as Hulen Skains and the procedure verified as Flexible Video Fiberoptic Bronchoscopy.  A Time Out was held and the above information confirmed.   Prior to the date of the procedure a high-resolution CT scan of the chest was performed. Utilizing ION software program a virtual tracheobronchial tree was generated to allow the creation of distinct navigation pathways to the patient's parenchymal abnormalities. After being taken to the operating room general anesthesia was initiated and the patient  was orally intubated. The video fiberoptic bronchoscope was introduced via the endotracheal tube and a general inspection was performed which showed normal right and left lung anatomy, aspiration of the bilateral mainstems was completed to remove any remaining secretions. Robotic catheter inserted into patient's  endotracheal tube.   Target #1 right lower lobe pulmonary nodule: The distinct navigation pathways prepared prior to this procedure were then utilized to navigate to patient's lesion identified on CT scan. The robotic catheter was secured into place and the vision probe was withdrawn.  Lesion location was approximated using fluoroscopy and three-dimensional cone beam CT imaging for CT-guided needle placement and for peripheral targeting. Under fluoroscopic guidance transbronchial needle brushings, transbronchial needle biopsies, and transbronchial forceps biopsies were performed to be sent for cytology and pathology.  Following tissue sampling a single fiducial was placed within proximity of the lesion using the fiducial catheter wire and delivery kit.  At the end of the procedure a general airway inspection was performed and there was no evidence of active bleeding. The bronchoscope was removed.  The patient tolerated the procedure well. There was no significant blood loss and there were no obvious complications. A post-procedural chest x-ray is pending.  Samples Target #1: 1. Transbronchial needle brushings from RLL 2. Transbronchial Wang needle biopsies from RLL 3. Transbronchial forceps biopsies from RLL  Plans:  The patient will be discharged from the PACU to home when recovered from anesthesia and after chest x-ray is reviewed. We will review the cytology, pathology results with the patient when they become available. Outpatient followup will be with [name], DO.  [name], DO [facility] 11/15/2022 8:26 AM

## 2022-11-15 NOTE — Interval H&P Note (Signed)
History and Physical Interval Note:  11/15/2022 7:27 AM  Ronald Duran  has presented today for surgery, with the diagnosis of lung nodule.  The various methods of treatment have been discussed with the patient and family. After consideration of risks, benefits and other options for treatment, the patient has consented to  Procedure(s) with comments: ROBOTIC ASSISTED NAVIGATIONAL BRONCHOSCOPY (Right) - ION w/ CIOS as a surgical intervention.  The patient's history has been reviewed, patient examined, no change in status, stable for surgery.  I have reviewed the patient's chart and labs.  Questions were answered to the patient's satisfaction.     Rachel Bo Mare Ludtke

## 2022-11-15 NOTE — Discharge Instructions (Signed)

## 2022-11-15 NOTE — Transfer of Care (Signed)
Immediate Anesthesia Transfer of Care Note  Patient: Ronald Duran  Procedure(s) Performed: ROBOTIC ASSISTED NAVIGATIONAL BRONCHOSCOPY (Right) BRONCHIAL NEEDLE ASPIRATION BIOPSIES BRONCHIAL BIOPSIES FIDUCIAL MARKER PLACEMENT BRONCHIAL BRUSHINGS  Patient Location: PACU  Anesthesia Type:General  Level of Consciousness: awake and alert   Airway & Oxygen Therapy: Patient Spontanous Breathing and Patient connected to face mask oxygen  Post-op Assessment: Report given to RN and Post -op Vital signs reviewed and stable  Post vital signs: Reviewed and stable  Last Vitals:  Vitals Value Taken Time  BP 118/78 11/15/22 0830  Temp    Pulse 79 11/15/22 0830  Resp    SpO2 98 % 11/15/22 0830  Vitals shown include unvalidated device data.  Last Pain:  Vitals:   11/15/22 0610  TempSrc: Oral         Complications: No notable events documented.

## 2022-11-16 ENCOUNTER — Encounter: Payer: Self-pay | Admitting: Orthopedic Surgery

## 2022-11-16 ENCOUNTER — Other Ambulatory Visit: Payer: Self-pay | Admitting: Radiology

## 2022-11-16 DIAGNOSIS — C9000 Multiple myeloma not having achieved remission: Secondary | ICD-10-CM

## 2022-11-17 ENCOUNTER — Ambulatory Visit (HOSPITAL_COMMUNITY)
Admission: RE | Admit: 2022-11-17 | Discharge: 2022-11-17 | Disposition: A | Discharge status: Home / Self Care | Payer: Medicare Other | Source: Ambulatory Visit | Attending: Internal Medicine | Admitting: Internal Medicine

## 2022-11-17 ENCOUNTER — Other Ambulatory Visit: Payer: Self-pay

## 2022-11-17 ENCOUNTER — Encounter (HOSPITAL_COMMUNITY): Payer: Self-pay

## 2022-11-17 DIAGNOSIS — C9000 Multiple myeloma not having achieved remission: Secondary | ICD-10-CM

## 2022-11-17 DIAGNOSIS — Z1379 Encounter for other screening for genetic and chromosomal anomalies: Secondary | ICD-10-CM | POA: Diagnosis not present

## 2022-11-17 HISTORY — PX: IR BONE MARROW BIOPSY & ASPIRATION: IMG5727

## 2022-11-17 LAB — CBC WITH DIFFERENTIAL/PLATELET
Abs Immature Granulocytes: 0.07 10*3/uL (ref 0.00–0.07)
Basophils Absolute: 0 10*3/uL (ref 0.0–0.1)
Basophils Relative: 0 %
Eosinophils Absolute: 0 10*3/uL (ref 0.0–0.5)
Eosinophils Relative: 0 %
HCT: 42.5 % (ref 39.0–52.0)
Hemoglobin: 14.6 g/dL (ref 13.0–17.0)
Immature Granulocytes: 1 %
Lymphocytes Relative: 15 %
Lymphs Abs: 1.9 10*3/uL (ref 0.7–4.0)
MCH: 33.6 pg (ref 26.0–34.0)
MCHC: 34.4 g/dL (ref 30.0–36.0)
MCV: 97.7 fL (ref 80.0–100.0)
Monocytes Absolute: 0.7 10*3/uL (ref 0.1–1.0)
Monocytes Relative: 6 %
Neutro Abs: 10.3 10*3/uL — ABNORMAL HIGH (ref 1.7–7.7)
Neutrophils Relative %: 78 %
Platelets: 192 10*3/uL (ref 150–400)
RBC: 4.35 MIL/uL (ref 4.22–5.81)
RDW: 13.7 % (ref 11.5–15.5)
WBC: 13.1 10*3/uL — ABNORMAL HIGH (ref 4.0–10.5)
nRBC: 0 % (ref 0.0–0.2)

## 2022-11-17 LAB — CYTOLOGY - NON PAP

## 2022-11-17 MED ORDER — FENTANYL CITRATE (PF) 100 MCG/2ML IJ SOLN
INTRAMUSCULAR | Status: AC
  Filled 2022-11-17: qty 2

## 2022-11-17 MED ORDER — MIDAZOLAM HCL 2 MG/2ML IJ SOLN
INTRAMUSCULAR | Status: AC
  Filled 2022-11-17: qty 2

## 2022-11-17 MED ORDER — MIDAZOLAM HCL 2 MG/2ML IJ SOLN
INTRAMUSCULAR | Status: AC | PRN
  Administered 2022-11-17 (×2): 1 mg via INTRAVENOUS

## 2022-11-17 MED ORDER — SODIUM CHLORIDE 0.9 % IV SOLN: INTRAVENOUS | Status: DC

## 2022-11-17 MED ORDER — LIDOCAINE HCL (PF) 1 % IJ SOLN
INTRAMUSCULAR | Status: AC
  Filled 2022-11-17: qty 30

## 2022-11-17 MED ORDER — LIDOCAINE-EPINEPHRINE (PF) 1 %-1:200000 IJ SOLN
30.0000 mL | Freq: Once | INTRAMUSCULAR | Status: AC
  Administered 2022-11-17: 5 mL
  Filled 2022-11-17: qty 30

## 2022-11-17 MED ORDER — FENTANYL CITRATE (PF) 100 MCG/2ML IJ SOLN
INTRAMUSCULAR | Status: AC | PRN
  Administered 2022-11-17 (×2): 50 ug via INTRAVENOUS

## 2022-11-17 NOTE — Procedures (Signed)
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   Complications: None Immediate EBL: None  Signed: Simonne Come Pager: [phone] 11/17/2022, 9:06 AM

## 2022-11-17 NOTE — Discharge Instructions (Signed)
Please call Interventional Radiology clinic [phone] with any questions or concerns. ? ?You may remove your dressing and shower tomorrow. ? ? ?Bone Marrow Aspiration and Bone Marrow Biopsy, Adult, Care After ?This sheet gives you information about how to care for yourself after your procedure. Your health care provider may also give you more specific instructions. If you have problems or questions, contact your health care provider. ?What can I expect after the procedure? ?After the procedure, it is common to have: ?Mild pain and tenderness. ?Swelling. ?Bruising. ?Follow these instructions at home: ?Puncture site care ?Follow instructions from your health care provider about how to take care of the puncture site. Make sure you: ?Wash your hands with soap and water before and after you change your bandage (dressing). If soap and water are not available, use hand sanitizer. ?Change your dressing as told by your health care provider. ?Check your puncture site every day for signs of infection. Check for: ?More redness, swelling, or pain. ?Fluid or blood. ?Warmth. ?Pus or a bad smell.   ?Activity ?Return to your normal activities as told by your health care provider. Ask your health care provider what activities are safe for you. ?Do not lift anything that is heavier than 10 lb (4.5 kg), or the limit that you are told, until your health care provider says that it is safe. ?Do not drive for 24 hours if you were given a sedative during your procedure. ?General instructions ?Take over-the-counter and prescription medicines only as told by your health care provider. ?Do not take baths, swim, or use a hot tub until your health care provider approves. Ask your health care provider if you may take showers. You may only be allowed to take sponge baths. ?If directed, put ice on the affected area. To do this: ?Put ice in a plastic bag. ?Place a towel between your skin and the bag. ?Leave the ice on for 20 minutes, 2-3 times a  day. ?Keep all follow-up visits as told by your health care provider. This is important.   ?Contact a health care provider if: ?Your pain is not controlled with medicine. ?You have a fever. ?You have more redness, swelling, or pain around the puncture site. ?You have fluid or blood coming from the puncture site. ?Your puncture site feels warm to the touch. ?You have pus or a bad smell coming from the puncture site. ?Summary ?After the procedure, it is common to have mild pain, tenderness, swelling, and bruising. ?Follow instructions from your health care provider about how to take care of the puncture site and what activities are safe for you. ?Take over-the-counter and prescription medicines only as told by your health care provider. ?Contact a health care provider if you have any signs of infection, such as fluid or blood coming from the puncture site. ?This information is not intended to replace advice given to you by your health care provider. Make sure you discuss any questions you have with your health care provider. ?Document Revised: 12/11/2018 Document Reviewed: 12/11/2018 ?Elsevier Patient Education ? 2021 [employer] ? ? ?Moderate Conscious Sedation, Adult, Care After ?This sheet gives you information about how to care for yourself after your procedure. Your health care provider may also give you more specific instructions. If you have problems or questions, contact your health care provider. ?What can I expect after the procedure? ?After the procedure, it is common to have: ?Sleepiness for several hours. ?Impaired judgment for several hours. ?Difficulty with balance. ?Vomiting if you eat too   soon. ?Follow these instructions at home: ?For the time period you were told by your health care provider: ?Rest. ?Do not participate in activities where you could fall or become injured. ?Do not drive or use machinery. ?Do not drink alcohol. ?Do not take sleeping pills or medicines that cause drowsiness. ?Do not  make important decisions or sign legal documents. ?Do not take care of children on your own.  ?  ?  ?Eating and drinking ?Follow the diet recommended by your health care provider. ?Drink enough fluid to keep your urine pale yellow. ?If you vomit: ?Drink water, juice, or soup when you can drink without vomiting. ?Make sure you have little or no nausea before eating solid foods.   ?General instructions ?Take over-the-counter and prescription medicines only as told by your health care provider. ?Have a responsible adult stay with you for the time you are told. It is important to have someone help care for you until you are awake and alert. ?Do not smoke. ?Keep all follow-up visits as told by your health care provider. This is important. ?Contact a health care provider if: ?You are still sleepy or having trouble with balance after 24 hours. ?You feel light-headed. ?You keep feeling nauseous or you keep vomiting. ?You develop a rash. ?You have a fever. ?You have redness or swelling around the IV site. ?Get help right away if: ?You have trouble breathing. ?You have new-onset confusion at home. ?Summary ?After the procedure, it is common to feel sleepy, have impaired judgment, or feel nauseous if you eat too soon. ?Rest after you get home. Know the things you should not do after the procedure. ?Follow the diet recommended by your health care provider and drink enough fluid to keep your urine pale yellow. ?Get help right away if you have trouble breathing or new-onset confusion at home. ?This information is not intended to replace advice given to you by your health care provider. Make sure you discuss any questions you have with your health care provider. ?Document Revised: 11/22/2019 Document Reviewed: 06/20/2019 ?Elsevier Patient Education ? 2021 [employer]  ?

## 2022-11-17 NOTE — H&P (Signed)
Chief Complaint: Patient was seen in consultation today for bone marrow biopsy   Referring Physician(s): Mohamed,Mohamed  Supervising Physician: Simonne Come  Patient Status: [facility] - Out-pt  History of Present Illness: Ronald Duran is a 82 y.o. male being worked up for myeloma. Found to have multiple lytic lesions. Underwent recent T10 vertebral biopsy with osteocool ablation therapy. 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 He is now referred for bone marrow biopsy.  PMHx, meds, labs, imaging, allergies reviewed. Feels well, no recent fevers, chills, illness. Has been NPO today as directed.   Past Medical History:  Diagnosis Date   Atrial fibrillation    CAD (coronary artery disease)    a. s/p MI with LAD stent placement December, 1999 -last stress test November 2008 with no EKG evidence of ischemia  b. cath 07/23/2015 after abnormal ETT and nuc, occluded mid LAD stent with good distal collateral, medical therapy    Cardiomyopathy    Ischemic cardiomyopathy with EF 40% by echo 2005   COVID 2021   mild case   Dyslipidemia    Erectile dysfunction    GERD (gastroesophageal reflux disease)    Hiatal hernia    with esophageal strictures and dilatations in the past - Dr. [name]   History of nuclear stress test    Myoview 12/16: EF 47%, anterior, anteroseptal and inferoseptal defect suggesting infarct with peri-infarct ischemia, intermediate risk   HTN (hypertension)    Hx of seasonal allergies    Hyperlipidemia    Myocardial infarction 08/01/1998   PAD (peripheral artery disease)    Pre-diabetes    Prostate cancer    with seed implant therapy following Lupron therapy in 2008 and 2009 - followed by Dr. Isabel Caprice   Ringworm    Stroke 04/20/2017   seen on a MRI   Vitamin D deficiency     Past Surgical History:  Procedure Laterality Date   CARDIAC CATHETERIZATION N/A 07/23/2015   Procedure: Left Heart Cath and Coronary Angiography;  Surgeon: [name], MD;  Location: [facility]  INVASIVE CV LAB;  Service: Cardiovascular;  Laterality: N/A;   CARDIOVERSION N/A 06/03/2022   Procedure: CARDIOVERSION;  Surgeon: [name], MD;  Location: [facility] ENDOSCOPY;  Service: Cardiovascular;  Laterality: N/A;   COLONOSCOPY WITH PROPOFOL N/A 11/24/2015   Procedure: COLONOSCOPY WITH PROPOFOL;  Surgeon: [name], MD;  Location: [facility] ENDOSCOPY;  Service: Endoscopy;  Laterality: N/A;   esophageal strictures with dilatations in the past per Dr. [name]     last '98   HERNIA REPAIR Left    INTRAMEDULLARY (IM) NAIL INTERTROCHANTERIC Left 10/25/2022   Procedure: LEFT INTRAMEDULLARY (IM) NAIL INTERTROCHANTERIC;  Surgeon: [name], MD;  Location: [facility] OR;  Service: Orthopedics;  Laterality: Left;   IR BONE TUMOR(S)RF ABLATION  10/11/2022   IR KYPHO THORACIC WITH BONE BIOPSY  10/11/2022   LAD stent placement - Dr. [name]     left inguinal hernia repair - Dr. [name]     prostatic radioactive seed implantation - Dr. [name]     '09    Allergies: Patient has no known allergies.  Medications: Prior to Admission medications   Medication Sig Start Date End Date Taking? Authorizing Provider  amLODipine (NORVASC) 5 MG tablet Take 1 tablet (5 mg total) by mouth daily. 11/07/22   Swinyer, Zachary George, NP  atorvastatin (LIPITOR) 40 MG tablet Take 40 mg by mouth at bedtime. 04/22/17   [provider]  B Complex Vitamins (B COMPLEX 100) tablet Take 1  tablet by mouth daily. Balance    [provider]  benazepril-hydrochlorthiazide (LOTENSIN HCT) 20-12.5 MG tablet Take 1 tablet by mouth daily. 11/07/22   Swinyer, Zachary GeorgeMichelle M, NP  cetirizine (ZYRTEC) 10 MG tablet Take 10 mg by mouth daily.    [provider]  Cholecalciferol (VITAMIN D3) 2000 UNITS TABS Take 2,000 Units by mouth daily.    [provider]  Coenzyme Q10 (CO Q-10) 100 MG CAPS Take 100 mg by mouth daily with supper.    [provider]  enoxaparin (LOVENOX) 80 MG/0.8ML injection Inject  0.8 mLs (80 mg total) into the skin every 12 (twelve) hours. 10/19/22   Mealor, Roberts GaudyAugustus E, MD  enoxaparin (LOVENOX) 80 MG/0.8ML injection Inject 0.8 mLs (80 mg total) into the skin every 12 (twelve) hours. 10/31/22   Lennette BihariKelly, Thomas A, MD  metoprolol succinate (TOPROL XL) 25 MG 24 hr tablet Take 1 tablet (25 mg total) by mouth daily. 09/20/22   Swinyer, Zachary GeorgeMichelle M, NP  Multiple Vitamins-Minerals (CENTRUM) tablet Take 1 tablet by mouth daily.    [provider]  nitroGLYCERIN (NITROSTAT) 0.4 MG SL tablet PLACE 1 TABLET UNDER THE TONGUE IF NEEDED EVERY 5 MINUTES FOR CHEST PAIN FOR 3 DOSES IF NO RELIEF AFTER FIRST DOSE CALL 911. 01/26/22   Lyn RecordsSmith, Henry W, MD  omeprazole (PRILOSEC) 20 MG capsule Take 20 mg by mouth daily before breakfast. 06/28/22   [provider]  oxyCODONE (OXY IR/ROXICODONE) 5 MG immediate release tablet Take 1-2 tablets (5-10 mg total) by mouth every 6 (six) hours as needed for moderate pain (pain score 4-6). 10/26/22   Cristie HemStanbery, Mary L, PA-C  warfarin (COUMADIN) 5 MG tablet TAKE 1/2 TO 1 TABLET BY MOUTH DAILY AS DIRECTED BY COUMADIN CLINIC Patient taking differently: Take 2.5-5 mg by mouth See admin instructions. Take 2.5 mg by mouth on Monday, Wednesday and Friday and take 5 mg on Tuesday, Thursday, Saturday and Sunday 09/21/22   Jake BatheSkains, Mark C, MD     Family History  Problem Relation Age of Onset   CVA Mother    CVA Father [id number]   CAD Sister     Social History   Socioeconomic History   Marital status: Married    Spouse name: ,Talbert ForestShirley   Number of children: 4   Years of education: Not on file   Highest education level: Not on file  Occupational History   Not on file  Tobacco Use   Smoking status: Never    Passive exposure: Past   Smokeless tobacco: Never  Vaping Use   Vaping Use: Never used  Substance and Sexual Activity   Alcohol use: Not Currently   Drug use: No   Sexual activity: Not Currently  Other Topics Concern   Not on file  Social History  Narrative   Not on file   Social Determinants of Health   Financial Resource Strain: Not on file  Food Insecurity: No Food Insecurity (10/12/2022)   Hunger Vital Sign    Worried About Running Out of Food in the Last Year: Never true    Ran Out of Food in the Last Year: Never true  Transportation Needs: No Transportation Needs (10/12/2022)   PRAPARE - Administrator, Civil ServiceTransportation    Lack of Transportation (Medical): No    Lack of Transportation (Non-Medical): No  Physical Activity: Not on file  Stress: Not on file  Social Connections: Not on file    Review of Systems: A 12 point ROS discussed and pertinent positives are indicated in  the HPI above.  All other systems are negative.  Review of Systems  Vital Signs: Ht 6' (1.829 m)   Wt 188 lb (85.3 kg)   BMI 25.50 kg/m  T: 97.6 HR: 84 BP: 114/87  Physical Exam Constitutional:      Appearance: Normal appearance.  HENT:     Mouth/Throat:     Mouth: Mucous membranes are moist.     Pharynx: Oropharynx is clear.  Cardiovascular:     Rate and Rhythm: Normal rate and regular rhythm.     Heart sounds: Normal heart sounds.  Pulmonary:     Effort: Pulmonary effort is normal.     Breath sounds: Normal breath sounds.  Skin:    General: Skin is warm and dry.  Neurological:     General: No focal deficit present.     Mental Status: He is alert and oriented to person, place, and time.  Psychiatric:        Mood and Affect: Mood normal.        Thought Content: Thought content normal.     Imaging: DG Chest Port 1 View  Result Date: 11/15/2022 CLINICAL DATA:  Status post bronchoscopy with biopsy EXAM: PORTABLE CHEST 1 VIEW COMPARISON:  CT Chest 11/11/22 FINDINGS: No pleural effusion. No pneumothorax. Normal cardiac and mediastinal contours. No focal airspace opacity. No radiographically apparent displaced rib fractures. Visualized upper abdomen is unremarkable. IMPRESSION: No focal airspace opacity.  No pneumothorax. Electronically Signed   By: Lorenza Cambridge M.D.   On: 11/15/2022 08:48   DG C-ARM BRONCHOSCOPY  Result Date: 11/15/2022 C-ARM BRONCHOSCOPY: Fluoroscopy was utilized by the requesting physician.  No radiographic interpretation.   CT Super D Chest Wo Contrast  Result Date: 11/11/2022 CLINICAL DATA:  Lung nodule EXAM: CT CHEST WITHOUT CONTRAST TECHNIQUE: Multidetector CT imaging of the chest was performed using thin slice collimation for electromagnetic bronchoscopy planning purposes, without intravenous contrast. RADIATION DOSE REDUCTION: This exam was performed according to the departmental dose-optimization program which includes automated exposure control, adjustment of the mA and/or kV according to patient size and/or use of iterative reconstruction technique. COMPARISON:  Chest CT dated January 24th 2024; PET-CT dated September 19, 2022 FINDINGS: Cardiovascular: Normal heart size. No pericardial effusion. Normal caliber thoracic aorta with mild calcified plaque. Subendocardial fat deposition and calcifications of the left ventricular apex, consistent with prior MI. Severe three-vessel coronary artery calcifications. Mediastinum/Nodes: Small hiatal hernia. Thyroid is unremarkable. No enlarged lymph nodes seen in the chest. Lungs/Pleura: Central airways are patent. No consolidation, pleural effusion or pneumothorax. Spiculated solid pulmonary nodule of the right lower lobe measures 10 x 6 mm on series 4 image 16 causing occlusion of the medial basal right lower lobe bronchus, unchanged when compared with prior exam. Additional scattered smaller solid pulmonary nodules are stable. Reference nodule of the right lower lobe measuring 3 mm on image 108. Upper Abdomen: No acute abnormality. Musculoskeletal: Kyphoplasty changes of T10. No aggressive appearing osseous lesions. IMPRESSION: 1. Stable spiculated solid pulmonary nodule of the right lower lobe. Recommend continued follow-up in 3 months. Alternatively, nodule has an endobronchial component and  bronchoscopy could be considered. 2. Severe three-vessel coronary artery calcifications. 3. Aortic Atherosclerosis (ICD10-I70.0). Electronically Signed   By: Allegra Lai M.D.   On: 11/11/2022 14:38   XR HIP UNILAT W OR W/O PELVIS 2-3 VIEWS LEFT  Result Date: 11/08/2022 X-rays demonstrate stable alignment of the hardware without complication  XR FEMUR MIN 2 VIEWS LEFT  Result Date: 11/08/2022 X-rays demonstrate stable  alignment of the hardware without complication  DG FEMUR MIN 2 VIEWS LEFT  Result Date: 10/25/2022 CLINICAL DATA:  Left femoral nail placement due to lytic bone lesion in the femoral neck EXAM: LEFT FEMUR 2 VIEWS; DG C-ARM 1-60 MIN-NO REPORT COMPARISON:  PET-CT 09/19/2022 FINDINGS: Four C-arm fluoroscopic images were obtained intraoperatively and submitted for post operative interpretation. Long intramedullary rod with proximal lag screws traverse lytic lesion in the left femoral neck. Hardware appears appropriately positioned. No perihardware fracture. 1 minute 14 seconds fluoroscopy time utilized. Radiation dose: 24.94 mGy. Please see the performing provider's procedural report for further detail. IMPRESSION: Fluoroscopic guidance provided for left femoral nail placement. Electronically Signed   By: Duanne Guess D.O.   On: 10/25/2022 08:58   DG C-Arm 1-60 Min-No Report  Result Date: 10/25/2022 CLINICAL DATA:  Left femoral nail placement due to lytic bone lesion in the femoral neck EXAM: LEFT FEMUR 2 VIEWS; DG C-ARM 1-60 MIN-NO REPORT COMPARISON:  PET-CT 09/19/2022 FINDINGS: Four C-arm fluoroscopic images were obtained intraoperatively and submitted for post operative interpretation. Long intramedullary rod with proximal lag screws traverse lytic lesion in the left femoral neck. Hardware appears appropriately positioned. No perihardware fracture. 1 minute 14 seconds fluoroscopy time utilized. Radiation dose: 24.94 mGy. Please see the performing provider's procedural report for  further detail. IMPRESSION: Fluoroscopic guidance provided for left femoral nail placement. Electronically Signed   By: Duanne Guess D.O.   On: 10/25/2022 08:58   XR HIP UNILAT W OR W/O PELVIS 2-3 VIEWS LEFT  Result Date: 10/19/2022 AP pelvis lateral view left hip reviewed.  Lytic lesion present in the middle of the femoral neck.  No cortical involvement.  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 present   Labs:  CBC: Recent Labs    10/11/22 0801 10/25/22 0623 11/02/22 0739 11/17/22 0740  WBC 9.6 9.0 11.5* 13.1*  HGB 16.9 15.5 15.7 14.6  HCT 48.7 44.1 45.6 42.5  PLT 156 152 195 192    COAGS: Recent Labs    10/17/22 1119 10/26/22 0411 10/31/22 1146 11/15/22 0603  INR 2.2 1.1 1.4* 1.1  APTT  --   --   --  27    BMP: Recent Labs    10/05/22 1112 10/11/22 0801 10/25/22 0623 11/02/22 0739  NA 136 139 134* 137  K 4.2 3.4* 3.5 3.9  CL 101 102 102 103  CO2 32 GLUCOSE 95 88 170* 114*  BUN CALCIUM 9.3 9.2 9.1 10.0  CREATININE 1.07 1.07 1.12 1.13  GFRNONAA >60 >60 >60 >60    LIVER FUNCTION TESTS: Recent Labs    10/05/22 1112 11/02/22 0739  BILITOT 1.7* 1.4*  AST 25 46*  ALT 31 58*  ALKPHOS 69 77  PROT 7.5 8.5*  ALBUMIN 3.9 3.8     Assessment and Plan: Plasmacytoma, 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 Risks and benefits of bone marrow biopsy was discussed with the patient and/or patient's family including, but not limited to bleeding, infection, damage to adjacent structures or low yield requiring additional tests.  All of the questions were answered and there is agreement to proceed.  Consent signed and in chart.    Electronically Signed: Brayton El, PA-C 11/17/2022, 8:05 AM   I spent a total of 20 minutes in face to face in clinical consultation, greater than 50% of which was counseling/coordinating care for bone marrow biopsy

## 2022-11-20 ENCOUNTER — Encounter (HOSPITAL_COMMUNITY): Payer: Self-pay | Admitting: Pulmonary Disease

## 2022-11-21 ENCOUNTER — Ambulatory Visit: Payer: Medicare Other | Attending: Internal Medicine | Admitting: Pharmacist

## 2022-11-21 DIAGNOSIS — Z7901 Long term (current) use of anticoagulants: Secondary | ICD-10-CM | POA: Diagnosis not present

## 2022-11-21 DIAGNOSIS — I4819 Other persistent atrial fibrillation: Secondary | ICD-10-CM

## 2022-11-21 DIAGNOSIS — Z79899 Other long term (current) drug therapy: Secondary | ICD-10-CM | POA: Diagnosis not present

## 2022-11-21 DIAGNOSIS — I6349 Cerebral infarction due to embolism of other cerebral artery: Secondary | ICD-10-CM | POA: Diagnosis not present

## 2022-11-21 LAB — POCT INR: POC INR: 1.9

## 2022-11-21 NOTE — Patient Instructions (Signed)
Take 1 tablet today then continue to take warfarin 1 tablet daily except for 1/2 a tablet on Monday, Wednesday and Friday.  Continue eating broccoli only every 2-3 days per week.  Recheck INR 2 weeks  Anticoagulation Clinic-[phone].

## 2022-11-22 ENCOUNTER — Encounter: Payer: Self-pay | Admitting: Acute Care

## 2022-11-22 ENCOUNTER — Ambulatory Visit (INDEPENDENT_AMBULATORY_CARE_PROVIDER_SITE_OTHER): Payer: Medicare Other | Admitting: Acute Care

## 2022-11-22 VITALS — BP 126/76 | HR 91 | Wt 187.8 lb

## 2022-11-22 DIAGNOSIS — Z85820 Personal history of malignant melanoma of skin: Secondary | ICD-10-CM

## 2022-11-22 DIAGNOSIS — C3491 Malignant neoplasm of unspecified part of right bronchus or lung: Secondary | ICD-10-CM

## 2022-11-22 DIAGNOSIS — Z8546 Personal history of malignant neoplasm of prostate: Secondary | ICD-10-CM | POA: Diagnosis not present

## 2022-11-22 DIAGNOSIS — C9000 Multiple myeloma not having achieved remission: Secondary | ICD-10-CM

## 2022-11-22 DIAGNOSIS — Z85828 Personal history of other malignant neoplasm of skin: Secondary | ICD-10-CM

## 2022-11-22 DIAGNOSIS — R911 Solitary pulmonary nodule: Secondary | ICD-10-CM | POA: Insufficient documentation

## 2022-11-22 LAB — SURGICAL PATHOLOGY

## 2022-11-22 NOTE — Progress Notes (Signed)
History of Present Illness [name] is a 82 y.o. male never smoker with past medical history of coronary disease, ischemic cardiomyopathy, hiatal hernia, hypertension, hyperlipidemia, vitamin D deficiency, prostate cancer and melanoma of the right arm, in addition to skin cancer. He was referred to Dr. Tonia Brooms 08/2022 by Dr. Alvera Singh for evaluation of a right lower lobe pulmonary nodule that was an incidental finding on a cardiac calcium scoring scan.    Synopsis 82 year old gentleman incidentally found pulmonary nodules.  He has a history of melanoma history of squamous of carcinoma of the skin, history of prostate.  Currently anticoagulated with atrial fibrillation on Coumadin.  His pet imaging revealed spinal metastasis and a lesion in the left hip.  He has orthopedic surgeries planned for possible pathologic fracture in the left hip next week.  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  He has already met with medical oncology. Lung nodule was biopsied 11/15/2022 to  rule out any other secondary malignancy. Pt. Is here for follow up after biopsy.      11/22/2022 Pt. Presents for for follow up. He states he has been doing well. No issues after bronch and biopsies. He denies any fever, chest pain, hemoptysis, or breathing  difficulties.  We discussed the results of his pathology. The right lower lobe lung nodule was positive for non small cell lung cancer.  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 Kathrynn Running.  Patient was seen by Dr. Kathrynn Running 11/24/2022, plan is for 3 fraction course of SBRT to the right lower lobe lung nodule and a 2-week course of daily postop radiation to the disease at T10 and the left femur.  Patient is in agreement with this plan.  He will also proceed with systemic therapy for his multiple myeloma myeloma per Dr.  Arbutus Ped.  Patient has appointments confirmed starting Tuesday, 11/29/2022 with radiation oncology and 420 11/26/2022 with medical oncology under the care of Dr. Arbutus Ped.  Dr. Tonia Brooms did place a single fiducial for SBRT therapy, during the bronchoscopy  Test Results: Cytology 11/15/2022 FINAL MICROSCOPIC DIAGNOSIS:  A. LUNG, RLL, FINE NEEDLE ASPIRATION:  - Atypical cells present (see Comment)   B. LUNG, RLL, BRUSHING:  - Atypical cells present   Morphologic evaluation reveals atypical cells suspicious for non-small  cell carcinoma   Chest Imaging:   CT chest 08/31/2022: 1 cm right lower lobe spiculated pulmonary nodule concerning for malignancy. Other small subcentimeter pulmonary nodules. The patient's images have been independently reviewed by me.     Nuclear medicine pet imaging February 2024: Hypermetabolic lesions within the thoracic spine as well as femoral head.  Also a small nodule that does not have any significant uptake. Concerning for possible metastatic disease to the bone. The patient's images have been independently reviewed by me.       Latest Ref Rng & Units 11/17/2022    7:40 AM 11/02/2022    7:39 AM 10/25/2022    6:23 AM  CBC  WBC 4.0 - 10.5 K/uL 13.1  11.5  9.0   Hemoglobin 13.0 - 17.0 g/dL 96.2  95.2  84.1   Hematocrit 39.0 - 52.0 % 42.5  45.6  44.1   Platelets 150 - 400 K/uL 192  195  152        Latest Ref Rng & Units 11/02/2022    7:39 AM  10/25/2022    6:23 AM 10/11/2022    8:01 AM  BMP  Glucose 70 - 99 mg/dL 409  811  88   BUN 8 - 23 mg/dL Creatinine 0.61 - 1.24 mg/dL 9.14  7.82  9.56   Sodium 135 - 145 mmol/L 137  134  139   Potassium 3.5 - 5.1 mmol/L 3.9  3.5  3.4   Chloride 98 - 111 mmol/L 103  102  102   CO2 22 - 32 mmol/L Calcium 8.9 - 10.3 mg/dL 21.3  9.1  9.2     BNP No results found for: "BNP"  ProBNP No results found for: "PROBNP"  PFT No results found for: "FEV1PRE", "FEV1POST", "FVCPRE", "FVCPOST", "TLC",  "DLCOUNC", "PREFEV1FVCRT", "PSTFEV1FVCRT"  IR BONE MARROW BIOPSY & ASPIRATION  Result Date: 11/17/2022 INDICATION: Concern for multiple myeloma. Please perform image guided bone marrow biopsy for tissue diagnostic purposes. EXAM: FLUOROSCOPIC GUIDED BONE MARROW BIOPSY AND ASPIRATION MEDICATIONS: None ANESTHESIA/SEDATION: Moderate (conscious) sedation was employed during this procedure as administered by the Interventional Radiology RN. A total of Versed 2 mg and Fentanyl 100 mcg was administered intravenously. Moderate Sedation Time: 10 minutes. The patient's level of consciousness and vital signs were monitored continuously by radiology nursing throughout the procedure under my direct supervision. FLUOROSCOPY TIME: 6 mGy COMPLICATIONS: None immediate. PROCEDURE: Informed consent was obtained from the patient following an explanation of the procedure, risks, benefits and alternatives. The patient understands, agrees and consents for the procedure. All questions were addressed. A time out was performed prior to the initiation of the procedure. The patient was positioned prone on the fluoroscopy table and the posterior aspect of the right iliac crest was marked fluoroscopically. The operative site was prepped and draped in the usual sterile fashion. Under sterile conditions and local anesthesia, an 11 gauge coaxial bone biopsy needle was advanced into the posterior aspect of the right iliac marrow space under intermittent fluoroscopic guidance. Multiple fluoroscopic images were saved procedural documentation purposes. Initially, a bone marrow aspiration was performed. Next, a bone marrow biopsy was obtained with the 11 gauge outer bone marrow device. Samples were prepared with the cytotechnologist and deemed adequate. The needle was removed and superficial hemostasis was obtained with manual compression. A dressing was applied. The patient tolerated the procedure well without immediate post procedural  complication. IMPRESSION: Successful fluoroscopic guided right iliac bone marrow aspiration and core biopsy. Electronically Signed   By: Simonne Come M.D.   On: 11/17/2022 09:25   DG Chest Port 1 View  Result Date: 11/15/2022 CLINICAL DATA:  Status post bronchoscopy with biopsy EXAM: PORTABLE CHEST 1 VIEW COMPARISON:  CT Chest 11/11/22 FINDINGS: No pleural effusion. No pneumothorax. Normal cardiac and mediastinal contours. No focal airspace opacity. No radiographically apparent displaced rib fractures. Visualized upper abdomen is unremarkable. IMPRESSION: No focal airspace opacity.  No pneumothorax. Electronically Signed   By: Lorenza Cambridge M.D.   On: 11/15/2022 08:48   DG C-ARM BRONCHOSCOPY  Result Date: 11/15/2022 C-ARM BRONCHOSCOPY: Fluoroscopy was utilized by the requesting physician.  No radiographic interpretation.   CT Super D Chest Wo Contrast  Result Date: 11/11/2022 CLINICAL DATA:  Lung nodule EXAM: CT CHEST WITHOUT CONTRAST TECHNIQUE: Multidetector CT imaging of the chest was performed using thin slice collimation for electromagnetic bronchoscopy planning purposes, without intravenous contrast. RADIATION DOSE REDUCTION: This exam was performed according to the departmental dose-optimization program which includes automated exposure control, adjustment of  the mA and/or kV according to patient size and/or use of iterative reconstruction technique. COMPARISON:  Chest CT dated January 24th 2024; PET-CT dated September 19, 2022 FINDINGS: Cardiovascular: Normal heart size. No pericardial effusion. Normal caliber thoracic aorta with mild calcified plaque. Subendocardial fat deposition and calcifications of the left ventricular apex, consistent with prior MI. Severe three-vessel coronary artery calcifications. Mediastinum/Nodes: Small hiatal hernia. Thyroid is unremarkable. No enlarged lymph nodes seen in the chest. Lungs/Pleura: Central airways are patent. No consolidation, pleural effusion or pneumothorax.  Spiculated solid pulmonary nodule of the right lower lobe measures 10 x 6 mm on series 4 image 16 causing occlusion of the medial basal right lower lobe bronchus, unchanged when compared with prior exam. Additional scattered smaller solid pulmonary nodules are stable. Reference nodule of the right lower lobe measuring 3 mm on image 108. Upper Abdomen: No acute abnormality. Musculoskeletal: Kyphoplasty changes of T10. No aggressive appearing osseous lesions. IMPRESSION: 1. Stable spiculated solid pulmonary nodule of the right lower lobe. Recommend continued follow-up in 3 months. Alternatively, nodule has an endobronchial component and bronchoscopy could be considered. 2. Severe three-vessel coronary artery calcifications. 3. Aortic Atherosclerosis (ICD10-I70.0). Electronically Signed   By: Allegra Lai M.D.   On: 11/11/2022 14:38   XR HIP UNILAT W OR W/O PELVIS 2-3 VIEWS LEFT  Result Date: 11/08/2022 X-rays demonstrate stable alignment of the hardware without complication  XR FEMUR MIN 2 VIEWS LEFT  Result Date: 11/08/2022 X-rays demonstrate stable alignment of the hardware without complication  DG FEMUR MIN 2 VIEWS LEFT  Result Date: 10/25/2022 CLINICAL DATA:  Left femoral nail placement due to lytic bone lesion in the femoral neck EXAM: LEFT FEMUR 2 VIEWS; DG C-ARM 1-60 MIN-NO REPORT COMPARISON:  PET-CT 09/19/2022 FINDINGS: Four C-arm fluoroscopic images were obtained intraoperatively and submitted for post operative interpretation. Long intramedullary rod with proximal lag screws traverse lytic lesion in the left femoral neck. Hardware appears appropriately positioned. No perihardware fracture. 1 minute 14 seconds fluoroscopy time utilized. Radiation dose: 24.94 mGy. Please see the performing provider's procedural report for further detail. IMPRESSION: Fluoroscopic guidance provided for left femoral nail placement. Electronically Signed   By: Duanne Guess D.O.   On: 10/25/2022 08:58   DG C-Arm  1-60 Min-No Report  Result Date: 10/25/2022 CLINICAL DATA:  Left femoral nail placement due to lytic bone lesion in the femoral neck EXAM: LEFT FEMUR 2 VIEWS; DG C-ARM 1-60 MIN-NO REPORT COMPARISON:  PET-CT 09/19/2022 FINDINGS: Four C-arm fluoroscopic images were obtained intraoperatively and submitted for post operative interpretation. Long intramedullary rod with proximal lag screws traverse lytic lesion in the left femoral neck. Hardware appears appropriately positioned. No perihardware fracture. 1 minute 14 seconds fluoroscopy time utilized. Radiation dose: 24.94 mGy. Please see the performing provider's procedural report for further detail. IMPRESSION: Fluoroscopic guidance provided for left femoral nail placement. Electronically Signed   By: Duanne Guess D.O.   On: 10/25/2022 08:58     Past medical hx Past Medical History:  Diagnosis Date   Atrial fibrillation    CAD (coronary artery disease)    a. s/p MI with LAD stent placement December, 1999 -last stress test November 2008 with no EKG evidence of ischemia  b. cath 07/23/2015 after abnormal ETT and nuc, occluded mid LAD stent with good distal collateral, medical therapy    Cardiomyopathy    Ischemic cardiomyopathy with EF 40% by echo 2005   COVID 2021   mild case   Dyslipidemia    Erectile dysfunction  GERD (gastroesophageal reflux disease)    Hiatal hernia    with esophageal strictures and dilatations in the past - Dr. [name]   History of nuclear stress test    Myoview 12/16: EF 47%, anterior, anteroseptal and inferoseptal defect suggesting infarct with peri-infarct ischemia, intermediate risk   HTN (hypertension)    Hx of seasonal allergies    Hyperlipidemia    Myocardial infarction 08/01/1998   PAD (peripheral artery disease)    Pre-diabetes    Prostate cancer    with seed implant therapy following Lupron therapy in 2008 and 2009 - followed by Dr. Isabel Caprice   Ringworm    Stroke 04/20/2017   seen on a MRI   Vitamin  D deficiency      Social History   Tobacco Use   Smoking status: Never    Passive exposure: Past   Smokeless tobacco: Never  Vaping Use   Vaping Use: Never used  Substance Use Topics   Alcohol use: Not Currently   Drug use: No    Mr.Rasp reports that he has never smoked. He has been exposed to tobacco smoke. He has never used smokeless tobacco. He reports that he does not currently use alcohol. He reports that he does not use drugs.  Tobacco Cessation: Patient is a never smoker   Past surgical hx, Family hx, Social hx all reviewed.  Current Outpatient Medications on File Prior to Visit  Medication Sig   amLODipine (NORVASC) 5 MG tablet Take 1 tablet (5 mg total) by mouth daily.   atorvastatin (LIPITOR) 40 MG tablet Take 40 mg by mouth at bedtime.   B Complex Vitamins (B COMPLEX 100) tablet Take 1 tablet by mouth daily. Balance   benazepril-hydrochlorthiazide (LOTENSIN HCT) 20-12.5 MG tablet Take 1 tablet by mouth daily.   cetirizine (ZYRTEC) 10 MG tablet Take 10 mg by mouth daily.   Cholecalciferol (VITAMIN D3) 2000 UNITS TABS Take 2,000 Units by mouth daily.   Coenzyme Q10 (CO Q-10) 100 MG CAPS Take 100 mg by mouth daily with supper.   enoxaparin (LOVENOX) 80 MG/0.8ML injection Inject 0.8 mLs (80 mg total) into the skin every 12 (twelve) hours.   enoxaparin (LOVENOX) 80 MG/0.8ML injection Inject 0.8 mLs (80 mg total) into the skin every 12 (twelve) hours.   metoprolol succinate (TOPROL XL) 25 MG 24 hr tablet Take 1 tablet (25 mg total) by mouth daily.   Multiple Vitamins-Minerals (CENTRUM) tablet Take 1 tablet by mouth daily.   nitroGLYCERIN (NITROSTAT) 0.4 MG SL tablet PLACE 1 TABLET UNDER THE TONGUE IF NEEDED EVERY 5 MINUTES FOR CHEST PAIN FOR 3 DOSES IF NO RELIEF AFTER FIRST DOSE CALL 911.   omeprazole (PRILOSEC) 20 MG capsule Take 20 mg by mouth daily before breakfast.   oxyCODONE (OXY IR/ROXICODONE) 5 MG immediate release tablet Take 1-2 tablets (5-10 mg total) by mouth  every 6 (six) hours as needed for moderate pain (pain score 4-6).   warfarin (COUMADIN) 5 MG tablet TAKE 1/2 TO 1 TABLET BY MOUTH DAILY AS DIRECTED BY COUMADIN CLINIC (Patient taking differently: Take 2.5-5 mg by mouth See admin instructions. Take 2.5 mg by mouth on Monday, Wednesday and Friday and take 5 mg on Tuesday, Thursday, Saturday and Sunday)   No current facility-administered medications on file prior to visit.     No Known Allergies  Review Of Systems:  Constitutional:   No  weight loss, night sweats,  Fevers, chills, fatigue, or  lassitude.  HEENT:   No headaches,  Difficulty  swallowing,  Tooth/dental problems, or  Sore throat,                No sneezing, itching, ear ache, nasal congestion, post nasal drip,   CV:  No chest pain,  Orthopnea, PND, swelling in lower extremities, anasarca, dizziness, palpitations, syncope.   GI  No heartburn, indigestion, abdominal pain, nausea, vomiting, diarrhea, change in bowel habits, loss of appetite, bloody stools.   Resp: No shortness of breath with exertion or at rest.  No excess mucus, no productive cough,  No non-productive cough,  No coughing up of blood.  No change in color of mucus.  No wheezing.  No chest wall deformity  Skin: no rash or lesions.  GU: no dysuria, change in color of urine, no urgency or frequency.  No flank pain, no hematuria   MS:  No joint pain or swelling.  No decreased range of motion.  No back pain.  Psych:  No change in mood or affect. No depression or anxiety.  No memory loss.   Vital Signs BP 126/76 (BP Location: Left Arm, Patient Position: Sitting, Cuff Size: Normal)   Pulse 91   Wt 187 lb 12.8 oz (85.2 kg)   SpO2 98%   BMI 25.47 kg/m    Physical Exam:  General- No distress,  A&Ox3, pleasant ENT: No sinus tenderness, TM clear, pale nasal mucosa, no oral exudate,no post nasal drip, no LAN Cardiac: S1, S2, regular rate and rhythm, no murmur Chest: No wheeze/ rales/ dullness; no accessory muscle  use, no nasal flaring, no sternal retractions Abd.: Soft Non-tender, nondistended, bowel sounds positive,Body mass index is 25.47 kg/m.  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  Never smoker Plan The biopsy of the RLL shows atypical cells suspicious non-small cell lung cancer. Dr. Tonia Brooms placed a fiducial  during the bronch which will help with the radiation treatment.  I have placed a formal referral to radiation oncology for SBRT to the lung cancer in your RLL , as we discussed today. You will get a call to get an appointment scheduled Follow up with Dr. Arbutus Ped as is scheduled for tomorrow. Follow up with [place] as needed.  Please contact office for sooner follow up if symptoms do not improve or worsen or seek emergency care    I spent 35 minutes dedicated to the care of this patient on the date of this encounter to include pre-visit review of records, face-to-face time with the patient discussing conditions above, post visit ordering of testing, clinical documentation with the electronic health record, making appropriate referrals as documented, and communicating necessary information to the patient's healthcare team.    Bevelyn Ngo, NP 11/22/2022  9:44 AM

## 2022-11-22 NOTE — Patient Instructions (Addendum)
It is good to see you today. I am glad you have done well after the procedure.  The biopsy of the RLL shows atypical cells suspicious non-small cell lung cancer. Dr. Tonia Brooms placed a fiducial  during the bronch which will help with the radiation treatment.  I have placed a formal referral to radiation oncology for SBRT , as we discussed today. You will get a call to get an appointment scheduled Follow up with Dr. Arbutus Ped as is scheduled for tomorrow. Follow up with [place] as needed.  Please contact office for sooner follow up if symptoms do not improve or worsen or seek emergency care

## 2022-11-23 ENCOUNTER — Telehealth: Payer: Self-pay | Admitting: Internal Medicine

## 2022-11-23 ENCOUNTER — Encounter: Payer: Self-pay | Admitting: Internal Medicine

## 2022-11-23 ENCOUNTER — Telehealth: Payer: Self-pay | Admitting: Pharmacy Technician

## 2022-11-23 ENCOUNTER — Telehealth: Payer: Self-pay | Admitting: Pharmacist

## 2022-11-23 ENCOUNTER — Other Ambulatory Visit (HOSPITAL_COMMUNITY): Payer: Self-pay

## 2022-11-23 ENCOUNTER — Encounter: Payer: Self-pay | Admitting: Medical Oncology

## 2022-11-23 ENCOUNTER — Inpatient Hospital Stay: Payer: Medicare Other | Attending: Internal Medicine

## 2022-11-23 ENCOUNTER — Inpatient Hospital Stay (HOSPITAL_BASED_OUTPATIENT_CLINIC_OR_DEPARTMENT_OTHER): Payer: Medicare Other | Admitting: Internal Medicine

## 2022-11-23 VITALS — BP 117/83 | HR 56 | Temp 97.9°F | Resp 16 | Wt 189.9 lb

## 2022-11-23 DIAGNOSIS — Z5112 Encounter for antineoplastic immunotherapy: Secondary | ICD-10-CM | POA: Insufficient documentation

## 2022-11-23 DIAGNOSIS — C9000 Multiple myeloma not having achieved remission: Secondary | ICD-10-CM | POA: Insufficient documentation

## 2022-11-23 DIAGNOSIS — C7951 Secondary malignant neoplasm of bone: Secondary | ICD-10-CM

## 2022-11-23 LAB — CBC WITH DIFFERENTIAL (CANCER CENTER ONLY)
Abs Immature Granulocytes: 0.11 10*3/uL — ABNORMAL HIGH (ref 0.00–0.07)
Basophils Absolute: 0.1 10*3/uL (ref 0.0–0.1)
Basophils Relative: 1 %
Eosinophils Absolute: 0.2 10*3/uL (ref 0.0–0.5)
Eosinophils Relative: 3 %
HCT: 46 % (ref 39.0–52.0)
Hemoglobin: 15.9 g/dL (ref 13.0–17.0)
Immature Granulocytes: 1 %
Lymphocytes Relative: 16 %
Lymphs Abs: 1.4 10*3/uL (ref 0.7–4.0)
MCH: 34.1 pg — ABNORMAL HIGH (ref 26.0–34.0)
MCHC: 34.6 g/dL (ref 30.0–36.0)
MCV: 98.7 fL (ref 80.0–100.0)
Monocytes Absolute: 0.9 10*3/uL (ref 0.1–1.0)
Monocytes Relative: 10 %
Neutro Abs: 6.3 10*3/uL (ref 1.7–7.7)
Neutrophils Relative %: 69 %
Platelet Count: 163 10*3/uL (ref 150–400)
RBC: 4.66 MIL/uL (ref 4.22–5.81)
RDW: 13.6 % (ref 11.5–15.5)
WBC Count: 9.1 10*3/uL (ref 4.0–10.5)
nRBC: 0 % (ref 0.0–0.2)

## 2022-11-23 LAB — CMP (CANCER CENTER ONLY)
ALT: 37 U/L (ref 0–44)
AST: 27 U/L (ref 15–41)
Albumin: 3.7 g/dL (ref 3.5–5.0)
Alkaline Phosphatase: 90 U/L (ref 38–126)
Anion gap: 4 — ABNORMAL LOW (ref 5–15)
BUN: 24 mg/dL — ABNORMAL HIGH (ref 8–23)
CO2: 30 mmol/L (ref 22–32)
Calcium: 9.8 mg/dL (ref 8.9–10.3)
Chloride: 104 mmol/L (ref 98–111)
Creatinine: 1.05 mg/dL (ref 0.61–1.24)
GFR, Estimated: >60 mL/min (ref >60)
Glucose, Bld: 85 mg/dL (ref 70–99)
Potassium: 4 mmol/L (ref 3.5–5.1)
Sodium: 138 mmol/L (ref 135–145)
Total Bilirubin: 0.7 mg/dL (ref 0.3–1.2)
Total Protein: 7.9 g/dL (ref 6.5–8.1)

## 2022-11-23 MED ORDER — DEXAMETHASONE 4 MG PO TABS: ORAL_TABLET | ORAL | 3 refills | Status: DC

## 2022-11-23 MED ORDER — ACYCLOVIR 200 MG PO CAPS: 200.0000 mg | ORAL_CAPSULE | Freq: Two times a day (BID) | ORAL | 5 refills | Status: DC

## 2022-11-23 NOTE — Patient Instructions (Signed)
Lenalidomide Capsules What is this medication? LENALIDOMIDE (len a LID oh mide) treats blood and bone marrow cancers. It works by slowing down the growth of cancer cells. This medicine may be used for other purposes; ask your health care provider or pharmacist if you have questions. COMMON BRAND NAME(S): Revlimid What should I tell my care team before I take this medication? They need to know if you have any of these conditions: Blood clots High blood pressure High cholesterol Kidney disease Lactose intolerant Liver disease Thyroid disease Tobacco use An unusual or allergic reaction to lenalidomide, thalidomide, other medications, foods, dyes, or preservatives Pregnant or trying to get pregnant Breast-feeding How should I use this medication? Take this medication by mouth with water. Take it as directed on the prescription label at the same time every day. Do not cut, crush, or chew this medication. Swallow the capsules whole. You can take it with or without food. If it upsets your stomach, take it with food. Keep taking it unless your care team tells you to stop. A special MedGuide will be given to you by the pharmacist with each prescription and refill. Be sure to read this information carefully each time. Talk to your care team about the use of this medication in children. Special care may be needed. Overdosage: If you think you have taken too much of this medicine contact a poison control center or emergency room at once. NOTE: This medicine is only for you. Do not share this medicine with others. What if I miss a dose? If you miss a dose, take it as soon as you can unless it is more than 12 hours late. If it is more than 12 hours late, skip the missed dose. Take the next dose at the normal time. Do not take double or extra doses. What may interact with this medication? This medication may interact with the following: Digoxin Estrogen hormones Medications that help the body  make more red blood cells, such as epoetin alfa or darbepoetin alfa Warfarin This list may not describe all possible interactions. Give your health care provider a list of all the medicines, herbs, non-prescription drugs, or dietary supplements you use. Also tell them if you smoke, drink alcohol, or use illegal drugs. Some items may interact with your medicine. What should I watch for while using this medication? Visit your care team for regular checks on your progress. You may need blood work done while taking this medication. This medication may cause serious skin reactions. They can happen weeks to months after starting the medication. Contact your care team right away if you notice fevers or flu-like symptoms with a rash. The rash may be red or purple and then turn into blisters or peeling of the skin. You may also notice a red rash with swelling of the face, lips, or lymph nodes in your neck or under your arms. Talk to your care team about your risk of cancer. You may be more at risk for certain types of cancers if you take this medication. Talk to your care team if you or your partner wish to become pregnant or think either of you might be pregnant. This medication can cause serious birth defects if taken during pregnancy or for 4 weeks after stopping treatment. Two negative pregnancy tests are required before starting this medication. A negative pregnancy test is also required every 2 to 4 weeks during treatment, even if you are not sexually active. Two reliable forms of contraception are  recommended while you are taking this medication and for 4 weeks after stopping treatment. Talk to your care team about effective forms of contraception. If you become pregnant, miss a menstrual cycle, or stop using contraception, stop taking this medication. Call your care team. Severe birth defects may occur even if just 1 dose is taken. Do not breastfeed while taking this medication. Talk to your care team about  breastfeeding. Changes to your treatment plan may be needed. If your partner can get pregnant, use a condom during sex while taking this medication and for 4 weeks after the last dose. Tell your care team right away if you think your partner might be pregnant. This medication can cause serious birth defects. Do not donate sperm while taking this medication and for 4 weeks after the last dose. Do not donate blood while you are talking this medication or for 4 weeks after stopping it. Donated blood may contain enough of this medication to cause birth defects in a fetus if transfused to someone who is pregnant. What side effects may I notice from receiving this medication? Side effects that you should report to your care team as soon as possible: Allergic reactions--skin rash, itching, hives, swelling of the face, lips, tongue, or throat Blood clot--pain, swelling, or warmth in the leg, shortness of breath, chest pain Change in your skin, such as a new growth, a sore that doesn't heal, or a change in a mole Heart attack--pain or tightness in the chest, shoulders, arms, or jaw, nausea, shortness of breath, cold or clammy skin, feeling faint or lightheaded High thyroid levels (hyperthyroidism)--fast or irregular heartbeat, weight loss, excessive sweating or sensitivity to heat, tremors or shaking, anxiety, nervousness, irregular menstrual cycle or spotting Infection--fever, chills, cough, or sore throat Liver injury--right upper belly pain, loss of appetite, nausea, light-colored stool, dark yellow or brown urine, yellowing skin or eyes, unusual weakness or fatigue Low thyroid levels (hypothyroidism)--unusual weakness or fatigue, increased sensitivity to cold, constipation, hair loss, dry skin, weight gain, feelings of depression Rash, fever, and swollen lymph nodes Redness, blistering, peeling, or loosening of the skin, including inside the mouth Stroke--sudden numbness or weakness of the face, arm, or  leg, trouble speaking, confusion, trouble walking, loss of balance or coordination, dizziness, severe headache, change in vision Tumor lysis syndrome (TLS)--nausea, vomiting, diarrhea, decrease in the amount of urine, dark urine, unusual weakness or fatigue, confusion, muscle pain or cramps, fast or irregular heartbeat, joint pain Unusual bruising or bleeding Side effects that usually do not require medical attention (report to your care team if they continue or are bothersome): Back pain Cough Diarrhea Fatigue Headache Nausea This list may not describe all possible side effects. Call your doctor for medical advice about side effects. You may report side effects to FDA at 1-800-FDA-1088. Where should I keep my medication? Keep out of the reach of children and pets. Store at room temperature between 20 and 25 degrees C (68 and 77 degrees F). Get rid of any unused medication after the expiration date. It is important to get rid of the medication as soon as you no longer need it or it is expired. You can do this in two ways: Take the medication to a medication take-back program. Check with your pharmacy or law enforcement to find a location. If you cannot return the medication, follow the directions in the MedGuide. NOTE: This sheet is a summary. It may not cover all possible information. If you have questions about this medicine, talk to  your doctor, pharmacist, or health care provider.  2023 [employer]vier/Gold Standard (2021-12-03 00:00:00)

## 2022-11-23 NOTE — Progress Notes (Signed)
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     Cycles 1 and 2: A cycle is every 28 days:     Lenalidomide      Dexamethasone      Daratumumab and hyaluronidase-fihj    Cycles 3 through 6: A cycle is every 28 days:     Lenalidomide      Dexamethasone      Daratumumab and hyaluronidase-fihj    Cycles 7 and beyond: A cycle is every 28 days:     Lenalidomide      Dexamethasone      Daratumumab and hyaluronidase-fihj   **Always confirm dose/schedule in your pharmacy ordering system**  Patient Characteristics: Multiple Myeloma, Newly Diagnosed, Transplant Ineligible or Refused, Standard Risk Disease Classification: Multiple Myeloma R-ISS Staging: II Therapeutic Status: Newly Diagnosed Is Patient Eligible for Transplant<= Transplant Ineligible or Refused Risk Status: Standard Risk Intent of Therapy: Non-Curative / Palliative Intent, Discussed with Patient 

## 2022-11-23 NOTE — Telephone Encounter (Signed)
Oral Oncology Patient Advocate Encounter  After completing a benefits investigation, prior authorization for lenalidomide is not required at this time through [facility].  Patient's copay is $6.44.  Patient may fill at [facility]     Jinger Neighbors, CPhT-Adv Oncology Pharmacy Patient Advocate [facility] Direct Number: [phone]  Fax: [phone]

## 2022-11-23 NOTE — Telephone Encounter (Signed)
Pt notified of lab appt tomorrow .

## 2022-11-23 NOTE — Progress Notes (Signed)
Ronald Duran is now enrolled in the [facility] program for Revlimid Copy of agreement given to pt.

## 2022-11-23 NOTE — Progress Notes (Signed)
[facility] patient agreement form completed and sent to be scanned.

## 2022-11-23 NOTE — Progress Notes (Signed)
[facility] [phone]   [phone]  OFFICE PROGRESS NOTE  [name], MD (Inactive) [address] [place] [address]  DIAGNOSIS:  1) Multiple myeloma, presented with plasmacytoma as well as other lytic lesions in the bone diagnosed in March 2024 2) right lower lobe pulmonary nodule suspicious for non-small cell lung cancer  PRIOR THERAPY: None  CURRENT THERAPY: Systemic chemotherapy with daratumumab subcutaneously weekly in addition to Velcade 1.3 Mg/M2 on days 1, 4, 8, 11 every 3 weeks as well as Revlimid 25 mg p.o. daily for 14 days every 3 weeks and Decadron 20 mg p.o. weekly with chemotherapy.  First cycle expected on 11/30/2022.  INTERVAL HISTORY: Ronald Duran 82 y.o. male returns to the clinic today for follow-up visit accompanied by his wife.  The patient is feeling fine today with no concerning complaints except for the mild fatigue and pain in the hip area after his surgery but he is recovering well.  He denied having any current chest pain, shortness of breath, cough or hemoptysis.  He has no nausea, vomiting, diarrhea or constipation.  He has no headache or visual changes.  He was seen by Dr. Tonia Brooms and had bronchoscopy and biopsy of right lower lobe lung nodule that showed atypical cells suspicious for non-small cell carcinoma.  The patient also had a bone marrow biopsy and aspirate on 11/17/2022 and the final pathology was consistent with plasma cell neoplasm with involvement of the bone marrow with 25% of the plasma cells.  The patient is here today for evaluation and recommendation regarding treatment of his condition.   MEDICAL HISTORY: Past Medical History:  Diagnosis Date   Atrial fibrillation    CAD (coronary artery disease)    a. s/p MI with LAD stent placement December, 1999 -last stress test November 2008 with no EKG evidence of ischemia  b. cath 07/23/2015 after abnormal ETT and nuc, occluded mid LAD  stent with good distal collateral, medical therapy    Cardiomyopathy    Ischemic cardiomyopathy with EF 40% by echo 2005   COVID 2021   mild case   Dyslipidemia    Erectile dysfunction    GERD (gastroesophageal reflux disease)    Hiatal hernia    with esophageal strictures and dilatations in the past - Dr. [name]   History of nuclear stress test    Myoview 12/16: EF 47%, anterior, anteroseptal and inferoseptal defect suggesting infarct with peri-infarct ischemia, intermediate risk   HTN (hypertension)    Hx of seasonal allergies    Hyperlipidemia    Myocardial infarction 08/01/1998   PAD (peripheral artery disease)    Pre-diabetes    Prostate cancer    with seed implant therapy following Lupron therapy in 2008 and 2009 - followed by Dr. Isabel Caprice   Ringworm    Stroke 04/20/2017   seen on a MRI   Vitamin D deficiency     ALLERGIES:  has No Known Allergies.  MEDICATIONS:  Current Outpatient Medications  Medication Sig Dispense Refill   amLODipine (NORVASC) 5 MG tablet Take 1 tablet (5 mg total) by mouth daily. 90 tablet 2   atorvastatin (LIPITOR) 40 MG tablet Take 40 mg by mouth at bedtime.     B Complex Vitamins (B COMPLEX 100) tablet Take 1 tablet by mouth daily. Balance     benazepril-hydrochlorthiazide (LOTENSIN HCT) 20-12.5 MG tablet Take 1 tablet by mouth daily. 90 tablet 2   cetirizine (ZYRTEC) 10  MG tablet Take 10 mg by mouth daily.     Cholecalciferol (VITAMIN D3) 2000 UNITS TABS Take 2,000 Units by mouth daily.     Coenzyme Q10 (CO Q-10) 100 MG CAPS Take 100 mg by mouth daily with supper.     metoprolol succinate (TOPROL XL) 25 MG 24 hr tablet Take 1 tablet (25 mg total) by mouth daily. 90 tablet 3   Multiple Vitamins-Minerals (CENTRUM) tablet Take 1 tablet by mouth daily.     nitroGLYCERIN (NITROSTAT) 0.4 MG SL tablet PLACE 1 TABLET UNDER THE TONGUE IF NEEDED EVERY 5 MINUTES FOR CHEST PAIN FOR 3 DOSES IF NO RELIEF AFTER FIRST DOSE CALL 911. 25 tablet 9   omeprazole  (PRILOSEC) 20 MG capsule Take 20 mg by mouth daily before breakfast.     oxyCODONE (OXY IR/ROXICODONE) 5 MG immediate release tablet Take 1-2 tablets (5-10 mg total) by mouth every 6 (six) hours as needed for moderate pain (pain score 4-6). 40 tablet 0   warfarin (COUMADIN) 5 MG tablet TAKE 1/2 TO 1 TABLET BY MOUTH DAILY AS DIRECTED BY COUMADIN CLINIC (Patient taking differently: Take 2.5-5 mg by mouth See admin instructions. Take 2.5 mg by mouth on Monday, Wednesday and Friday and take 5 mg on Tuesday, Thursday, Saturday and Sunday) 100 tablet 0   No current facility-administered medications for this visit.    SURGICAL HISTORY:  Past Surgical History:  Procedure Laterality Date   BRONCHIAL BIOPSY  11/15/2022   Procedure: BRONCHIAL BIOPSIES;  Surgeon: [name], DO;  Location: [facility] END[facility]COPY;  Service: Pulmonary;;   BRONCHIAL BRUSHINGS  11/15/2022   Procedure: BRONCHIAL BRUSHINGS;  Surgeon: [name], DO;  Location: [facility];  Service: Pulmonary;;   BRONCHIAL NEEDLE ASPIRATION BIOPSY  11/15/2022   Procedure: BRONCHIAL NEEDLE ASPIRATION BIOPSIES;  Surgeon: [name], DO;  Location: [facility]OSCOPY;  Service: Pulmonary;;   CARDIAC CATHETERIZATION N/A 07/23/2015   Procedure: Left Heart Cath and Coronary Angiography;  Surgeon: [name], MD;  Location[facility] INVASIVE CV LAB;  Service: Cardiovascular;  Laterality: N/A;   CARDIOVERSION N/A 06/03/2022   Procedure: CARDIOVERSION;  Surgeon: [name], MD;  Location: [facility] ENDOSCOPY;  Service: Cardiovascular;  Laterality: N/A;   COLONOSCOPY WITH PROPOFOL N/A 11/24/2015   Procedure: COLONOSCOPY WITH PROPOFOL;  Surgeon: [name], MD;  Location: [facility] ENDOSCOPY;  Service: Endoscopy;  Laterality: N/A;   esophageal strictures with dilatations in the past per Dr. [name]     last '98   FIDUCIAL MARKER PLACEMENT  11/15/2022   Procedure: FIDUCIAL MARKER PLACEMENT;  Surgeon: [name], DO;  Location: [facility] ENDOSCOPY;  Service:  Pulmonary;;   HERNIA REPAIR Left    INTRAMEDULLARY (IM) NAIL INTERTROCHANTERIC Left 10/25/2022   Procedure: LEFT INTRAMEDULLARY (IM) NAIL INTERTROCHANTERIC;  Surgeon: [name], MD;  Location: [facility] OR;  Service: Orthopedics;  Laterality: Left;   IR BONE MARROW BIOPSY & ASPIRATION  11/17/2022   IR BONE TUMOR(S)RF ABLATION  10/11/2022   IR KYPHO THORACIC WITH BONE BIOPSY  10/11/2022   LAD stent placement - Dr. [name]     left inguinal hernia repair - Dr. [name]     prostatic radioactive seed implantation - Dr. [name]     '09    REVIEW OF SYSTEMS:  Constitutional: positive for fatigue Eyes: negative Ears, nose, mouth, throat, and face: negative Respiratory: negative Cardiovascular: negative Gastrointestinal: negative Genitourinary:negative Integument/breast: negative Hematologic/lymphatic: negative Musculoskeletal:positive for bone pain Neurological: negative Behavioral/Psych: negative Endocrine: negative Allergic/Immunologic: negative  PHYSICAL EXAMINATION: General appearance: alert, cooperative, fatigued, and no distress Head: Normocephalic, without obvious abnormality, atraumatic Neck: no adenopathy, no JVD, supple, symmetrical, trachea midline, and thyroid not enlarged, symmetric, no tenderness/mass/nodules Lymph nodes: Cervical, supraclavicular, and axillary nodes normal. Resp: clear to auscultation bilaterally Back: symmetric, no curvature. ROM normal. No CVA tenderness. Cardio: regular rate and rhythm, S1, S2 normal, no murmur, click, rub or gallop GI: soft, non-tender; bowel sounds normal; no masses,  no organomegaly Extremities: extremities normal, atraumatic, no cyanosis or edema Neurologic: Alert and oriented X 3, normal strength and tone. Normal symmetric reflexes. Normal coordination and gait  ECOG PERFORMANCE STATUS: 1 - Symptomatic but completely ambulatory  Blood pressure 117/83, pulse (!) 56, temperature 97.9 F (36.6 C), temperature source Oral, resp.  rate 16, weight 189 lb 14.4 oz (86.1 kg), SpO2 98 %.  LABORATORY DATA: Lab Results  Component Value Date   WBC 9.1 11/23/2022   HGB 15.9 11/23/2022   HCT 46.0 11/23/2022   MCV 98.7 11/23/2022   PLT 163 11/23/2022      Chemistry      Component Value Date/Time   NA 138 11/23/2022 0828   NA 141 08/25/2022 1440   K 4.0 11/23/2022 0828   CL 104 11/23/2022 0828   CO2 30 11/23/2022 0828   BUN 24 (H) 11/23/2022 0828   BUN 14 08/25/2022 1440   CREATININE 1.05 11/23/2022 0828   CREATININE 1.23 (H) 07/21/2015 0917      Component Value Date/Time   CALCIUM 9.8 11/23/2022 0828   ALKPHOS 90 11/23/2022 0828   AST 27 11/23/2022 0828   ALT 37 11/23/2022 0828   BILITOT 0.7 11/23/2022 0828       RADIOGRAPHIC STUDIES: IR BONE MARROW BIOPSY & ASPIRATION  Result Date: 11/17/2022 INDICATION: Concern for multiple myeloma. Please perform image guided bone marrow biopsy for tissue diagnostic purposes. EXAM: FLUOROSCOPIC GUIDED BONE MARROW BIOPSY AND ASPIRATION MEDICATIONS: None ANESTHESIA/SEDATION: Moderate (conscious) sedation was employed during this procedure as administered by the Interventional Radiology RN. A total of Versed 2 mg and Fentanyl 100 mcg was administered intravenously. Moderate Sedation Time: 10 minutes. The patient's level of consciousness and vital signs were monitored continuously by radiology nursing throughout the procedure under my direct supervision. FLUOROSCOPY TIME: 6 mGy COMPLICATIONS: None immediate. PROCEDURE: Informed consent was obtained from the patient following an explanation of the procedure, risks, benefits and alternatives. The patient understands, agrees and consents for the procedure. All questions were addressed. A time out was performed prior to the initiation of the procedure. The patient was positioned prone on the fluoroscopy table and the posterior aspect of the right iliac crest was marked fluoroscopically. The operative site was prepped and draped in the  usual sterile fashion. Under sterile conditions and local anesthesia, an 11 gauge coaxial bone biopsy needle was advanced into the posterior aspect of the right iliac marrow space under intermittent fluoroscopic guidance. Multiple fluoroscopic images were saved procedural documentation purposes. Initially, a bone marrow aspiration was performed. Next, a bone marrow biopsy was obtained with the 11 gauge outer bone marrow device. Samples were prepared with the cytotechnologist and deemed adequate. The needle was removed and superficial hemostasis was obtained with manual compression. A dressing was applied. The patient tolerated the procedure well without immediate post procedural complication. IMPRESSION: Successful fluoroscopic guided right iliac bone marrow aspiration and core biopsy. Electronically Signed   By: Simonne Come M.D.   On: 11/17/2022 09:25   DG Chest Port 1 View  Result Date: 11/15/2022  CLINICAL DATA:  Status post bronchoscopy with biopsy EXAM: PORTABLE CHEST 1 VIEW COMPARISON:  CT Chest 11/11/22 FINDINGS: No pleural effusion. No pneumothorax. Normal cardiac and mediastinal contours. No focal airspace opacity. No radiographically apparent displaced rib fractures. Visualized upper abdomen is unremarkable. IMPRESSION: No focal airspace opacity.  No pneumothorax. Electronically Signed   By: Lorenza Cambridge M.D.   On: 11/15/2022 08:48   DG C-ARM BRONCHOSCOPY  Result Date: 11/15/2022 C-ARM BRONCHOSCOPY: Fluoroscopy was utilized by the requesting physician.  No radiographic interpretation.   CT Super D Chest Wo Contrast  Result Date: 11/11/2022 CLINICAL DATA:  Lung nodule EXAM: CT CHEST WITHOUT CONTRAST TECHNIQUE: Multidetector CT imaging of the chest was performed using thin slice collimation for electromagnetic bronchoscopy planning purposes, without intravenous contrast. RADIATION DOSE REDUCTION: This exam was performed according to the departmental dose-optimization program which includes automated  exposure control, adjustment of the mA and/or kV according to patient size and/or use of iterative reconstruction technique. COMPARISON:  Chest CT dated January 24th 2024; PET-CT dated September 19, 2022 FINDINGS: Cardiovascular: Normal heart size. No pericardial effusion. Normal caliber thoracic aorta with mild calcified plaque. Subendocardial fat deposition and calcifications of the left ventricular apex, consistent with prior MI. Severe three-vessel coronary artery calcifications. Mediastinum/Nodes: Small hiatal hernia. Thyroid is unremarkable. No enlarged lymph nodes seen in the chest. Lungs/Pleura: Central airways are patent. No consolidation, pleural effusion or pneumothorax. Spiculated solid pulmonary nodule of the right lower lobe measures 10 x 6 mm on series 4 image 16 causing occlusion of the medial basal right lower lobe bronchus, unchanged when compared with prior exam. Additional scattered smaller solid pulmonary nodules are stable. Reference nodule of the right lower lobe measuring 3 mm on image 108. Upper Abdomen: No acute abnormality. Musculoskeletal: Kyphoplasty changes of T10. No aggressive appearing osseous lesions. IMPRESSION: 1. Stable spiculated solid pulmonary nodule of the right lower lobe. Recommend continued follow-up in 3 months. Alternatively, nodule has an endobronchial component and bronchoscopy could be considered. 2. Severe three-vessel coronary artery calcifications. 3. Aortic Atherosclerosis (ICD10-I70.0). Electronically Signed   By: Allegra Lai M.D.   On: 11/11/2022 14:38   XR HIP UNILAT W OR W/O PELVIS 2-3 VIEWS LEFT  Result Date: 11/08/2022 X-rays demonstrate stable alignment of the hardware without complication  XR FEMUR MIN 2 VIEWS LEFT  Result Date: 11/08/2022 X-rays demonstrate stable alignment of the hardware without complication  DG FEMUR MIN 2 VIEWS LEFT  Result Date: 10/25/2022 CLINICAL DATA:  Left femoral nail placement due to lytic bone lesion in the  femoral neck EXAM: LEFT FEMUR 2 VIEWS; DG C-ARM 1-60 MIN-NO REPORT COMPARISON:  PET-CT 09/19/2022 FINDINGS: Four C-arm fluoroscopic images were obtained intraoperatively and submitted for post operative interpretation. Long intramedullary rod with proximal lag screws traverse lytic lesion in the left femoral neck. Hardware appears appropriately positioned. No perihardware fracture. 1 minute 14 seconds fluoroscopy time utilized. Radiation dose: 24.94 mGy. Please see the performing provider's procedural report for further detail. IMPRESSION: Fluoroscopic guidance provided for left femoral nail placement. Electronically Signed   By: Duanne Guess D.O.   On: 10/25/2022 08:58   DG C-Arm 1-60 Min-No Report  Result Date: 10/25/2022 CLINICAL DATA:  Left femoral nail placement due to lytic bone lesion in the femoral neck EXAM: LEFT FEMUR 2 VIEWS; DG C-ARM 1-60 MIN-NO REPORT COMPARISON:  PET-CT 09/19/2022 FINDINGS: Four C-arm fluoroscopic images were obtained intraoperatively and submitted for post operative interpretation. Long intramedullary rod with proximal lag screws traverse lytic lesion in the left femoral  neck. Hardware appears appropriately positioned. No perihardware fracture. 1 minute 14 seconds fluoroscopy time utilized. Radiation dose: 24.94 mGy. Please see the performing provider's procedural report for further detail. IMPRESSION: Fluoroscopic guidance provided for left femoral nail placement. Electronically Signed   By: Duanne Guess D.O.   On: 10/25/2022 08:58    ASSESSMENT AND PLAN: This is a very pleasant 82 years old white male with multiple myeloma presented with multiple lytic lesions involving T10, left femoral neck as well as T4, T9 and T11 vertebral bodies. Also had right lower lobe lung nodule that was suspicious for non-small cell lung cancer. The biopsy performed by interventional radiology from the T10 lesion was consistent with plasmacytoma which is part of the whole picture of  multiple myeloma. The biopsy from the left hip was negative for malignancy. His myeloma panel showed no concerning findings except for elevated IgG but the serum light chains were normal. The patient underwent left hip fixation for impending fracture. He also underwent palliative radiotherapy under the care of Dr. Kathrynn Running. His recent bone marrow biopsy and aspirate was consistent with plasma cell neoplasm with involvement of plasma cells 25% of the bone marrow. I had a lengthy discussion with the patient and his wife today about his current condition and treatment options. I recommended for the patient treatment with subcutaneous daratumumab, Velcade, Revlimid and Decadron every 3 weeks. He is expected to start the first cycle of this treatment next week. I discussed with him the adverse effect of this treatment including but not limited to mild alopecia, myelosuppression, nausea and vomiting, peripheral neuropathy, liver or renal dysfunction. He will have a chemotherapy education class before the first dose of treatment. I will call his pharmacy with prescription for acyclovir as well as the Decadron treatment. For the suspicious right lower lobe lung cancer, I the patient is expected to undergo SBRT under the care of Dr. Kathrynn Running. He will come back for follow-up visit in 2 weeks for evaluation and management of any adverse effect of his treatment. For anticoagulation, he is currently on Coumadin. The patient was advised to call immediately if he has any other concerning symptoms in the interval. The patient voices understanding of current disease status and treatment options and is in agreement with the current care plan.  All questions were answered. The patient knows to call the clinic with any problems, questions or concerns. We can certainly see the patient much sooner if necessary.  The total time spent in the appointment was 55 minutes.  Disclaimer: This note was dictated with voice  recognition software. Similar sounding words can inadvertently be transcribed and may not be corrected upon review.

## 2022-11-23 NOTE — Telephone Encounter (Signed)
Oral Oncology Pharmacist Encounter  Received new prescription for Revlimid (lenalidomide) for the treatment of multiple myeloma in conjunction with daratumumab, bortezomib and dexamethasone, planned duration until disease progression or unacceptable drug toxicity.  CBC w/ Diff and CMP from 11/23/22 assessed, no relevant lab abnormalities requiring baseline dose adjustment required at this time. Plan is for patient to be on Revlimid 25 mg daily for 14 days on, 7 days off, repeated every 21 days.   Current medication list in Epic reviewed, no relevant/significant DDIs with Revlimid identified.  Evaluated chart and no patient barriers to medication adherence noted.   Patient agreement for treatment documented in MD note on 11/23/22.  Once [employer] auth # is obtained, Revlimid will need to be e-scribed to [facility] for dispensing.   Oral Oncology Clinic will continue to follow for insurance authorization, copayment issues, initial counseling and start date.  Lenord Carbo, PharmD, BCPS, BCOP Hematology/Oncology Clinical Pharmacist [facility] and [facility] Oral Chemotherapy Navigation Clinics [phone] 11/23/2022 1:14 PM

## 2022-11-24 ENCOUNTER — Other Ambulatory Visit: Payer: Self-pay

## 2022-11-24 ENCOUNTER — Encounter: Payer: Self-pay | Admitting: Urology

## 2022-11-24 ENCOUNTER — Inpatient Hospital Stay: Payer: Medicare Other

## 2022-11-24 ENCOUNTER — Other Ambulatory Visit: Payer: Self-pay | Admitting: Internal Medicine

## 2022-11-24 ENCOUNTER — Other Ambulatory Visit: Payer: Self-pay | Admitting: Pharmacist

## 2022-11-24 ENCOUNTER — Other Ambulatory Visit: Payer: Self-pay | Admitting: Medical Oncology

## 2022-11-24 ENCOUNTER — Ambulatory Visit
Admission: RE | Admit: 2022-11-24 | Discharge: 2022-11-24 | Disposition: A | Discharge status: Home / Self Care | Payer: Medicare Other | Source: Ambulatory Visit | Attending: Urology | Admitting: Urology

## 2022-11-24 ENCOUNTER — Ambulatory Visit
Admission: RE | Admit: 2022-11-24 | Discharge: 2022-11-24 | Disposition: A | Discharge status: Home / Self Care | Payer: Medicare Other | Source: Ambulatory Visit | Attending: Radiation Oncology | Admitting: Radiation Oncology

## 2022-11-24 VITALS — BP 126/84 | HR 85 | Temp 96.7°F | Resp 18 | Ht 72.0 in | Wt 190.0 lb

## 2022-11-24 DIAGNOSIS — C9000 Multiple myeloma not having achieved remission: Secondary | ICD-10-CM | POA: Diagnosis not present

## 2022-11-24 DIAGNOSIS — Z7901 Long term (current) use of anticoagulants: Secondary | ICD-10-CM | POA: Diagnosis not present

## 2022-11-24 DIAGNOSIS — R911 Solitary pulmonary nodule: Secondary | ICD-10-CM

## 2022-11-24 DIAGNOSIS — Z79899 Other long term (current) drug therapy: Secondary | ICD-10-CM | POA: Diagnosis not present

## 2022-11-24 DIAGNOSIS — Z7952 Long term (current) use of systemic steroids: Secondary | ICD-10-CM | POA: Diagnosis not present

## 2022-11-24 DIAGNOSIS — I700 Atherosclerosis of aorta: Secondary | ICD-10-CM | POA: Diagnosis not present

## 2022-11-24 DIAGNOSIS — I2510 Atherosclerotic heart disease of native coronary artery without angina pectoris: Secondary | ICD-10-CM | POA: Insufficient documentation

## 2022-11-24 DIAGNOSIS — E785 Hyperlipidemia, unspecified: Secondary | ICD-10-CM | POA: Insufficient documentation

## 2022-11-24 DIAGNOSIS — C3431 Malignant neoplasm of lower lobe, right bronchus or lung: Secondary | ICD-10-CM | POA: Diagnosis present

## 2022-11-24 DIAGNOSIS — I10 Essential (primary) hypertension: Secondary | ICD-10-CM | POA: Diagnosis not present

## 2022-11-24 DIAGNOSIS — K219 Gastro-esophageal reflux disease without esophagitis: Secondary | ICD-10-CM | POA: Insufficient documentation

## 2022-11-24 DIAGNOSIS — I252 Old myocardial infarction: Secondary | ICD-10-CM | POA: Insufficient documentation

## 2022-11-24 DIAGNOSIS — Z79624 Long term (current) use of inhibitors of nucleotide synthesis: Secondary | ICD-10-CM | POA: Diagnosis not present

## 2022-11-24 DIAGNOSIS — I4891 Unspecified atrial fibrillation: Secondary | ICD-10-CM | POA: Insufficient documentation

## 2022-11-24 DIAGNOSIS — E559 Vitamin D deficiency, unspecified: Secondary | ICD-10-CM | POA: Diagnosis not present

## 2022-11-24 DIAGNOSIS — Z8673 Personal history of transient ischemic attack (TIA), and cerebral infarction without residual deficits: Secondary | ICD-10-CM | POA: Diagnosis not present

## 2022-11-24 DIAGNOSIS — Z8619 Personal history of other infectious and parasitic diseases: Secondary | ICD-10-CM | POA: Insufficient documentation

## 2022-11-24 DIAGNOSIS — C61 Malignant neoplasm of prostate: Secondary | ICD-10-CM

## 2022-11-24 DIAGNOSIS — I739 Peripheral vascular disease, unspecified: Secondary | ICD-10-CM | POA: Insufficient documentation

## 2022-11-24 DIAGNOSIS — K449 Diaphragmatic hernia without obstruction or gangrene: Secondary | ICD-10-CM | POA: Diagnosis not present

## 2022-11-24 DIAGNOSIS — C7951 Secondary malignant neoplasm of bone: Secondary | ICD-10-CM

## 2022-11-24 DIAGNOSIS — Z5112 Encounter for antineoplastic immunotherapy: Secondary | ICD-10-CM | POA: Diagnosis not present

## 2022-11-24 DIAGNOSIS — C3491 Malignant neoplasm of unspecified part of right bronchus or lung: Secondary | ICD-10-CM | POA: Insufficient documentation

## 2022-11-24 LAB — ABO/RH: ABO/RH(D): A POS

## 2022-11-24 MED ORDER — LENALIDOMIDE 25 MG PO CAPS
ORAL_CAPSULE | ORAL | 0 refills | Status: DC
Start: 2022-12-05 — End: 2022-11-24

## 2022-11-24 MED ORDER — LENALIDOMIDE 25 MG PO CAPS: 25.0000 mg | ORAL_CAPSULE | Freq: Every day | ORAL | 0 refills | Status: DC

## 2022-11-24 MED ORDER — WARFARIN SODIUM 5 MG PO TABS
5.0000 mg | ORAL_TABLET | Freq: Every day | ORAL | 11 refills | Status: DC
Start: 2022-11-24 — End: 2022-11-24

## 2022-11-24 MED ORDER — LENALIDOMIDE 25 MG PO CAPS
25.0000 mg | ORAL_CAPSULE | Freq: Every day | ORAL | 0 refills | Status: DC
Start: 2022-12-05 — End: 2022-11-24

## 2022-11-24 MED ORDER — PROCHLORPERAZINE MALEATE 10 MG PO TABS
10.0000 mg | ORAL_TABLET | Freq: Four times a day (QID) | ORAL | 1 refills | Status: DC | PRN
Start: 2022-11-24 — End: 2022-11-24

## 2022-11-24 MED ORDER — PROCHLORPERAZINE MALEATE 10 MG PO TABS
10.0000 mg | ORAL_TABLET | Freq: Four times a day (QID) | ORAL | 1 refills | Status: DC | PRN
Start: 2022-11-24 — End: 2023-12-07

## 2022-11-24 NOTE — Progress Notes (Signed)
Nursing interview for treatment of a newly found lung nodule. Patient identity verified.  Patient reports doing well. No discomfort or SOB conveyed at this time.  Meaningful use complete.  Vitals- BP 126/84 (BP Location: Left Arm, Patient Position: Sitting, Cuff Size: Normal)   Pulse 85   Temp (!) 96.7 F (35.9 C) (Temporal)   Resp 18   Ht 6' (1.829 m)   Wt 190 lb (86.2 kg)   SpO2 98%   BMI 25.77 kg/m    This concludes the interview.   Ruel Favors, LPN

## 2022-11-24 NOTE — Progress Notes (Signed)
Oral Oncology Pharmacist Encounter   Patient demographics, medication list and insurance card shared with [facility] so Revlimid could be dispensed. Request made to please expedite order as patient starts next week.   Oral Chemotherapy Clinic will follow up with outside pharmacy to ensure Rx is delivered.    Lenord Carbo, PharmD, BCPS, [facility] Hematology/Oncology Clinical Pharmacist [facility] and [facility] Oral Chemotherapy Navigation Clinics [phone] 11/24/2022 2:59 PM

## 2022-11-24 NOTE — Addendum Note (Signed)
Addended by: Charma Igo on: 11/24/2022 01:02 PM   Modules accepted: Orders

## 2022-11-24 NOTE — Progress Notes (Signed)
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    ICD-10-CM   1. Lung nodule  R91.1     2. Prostate cancer  C61     3. Multiple myeloma without remission  C90.00     4. Non-small cell cancer of right lung  C34.91       HISTORY OF PRESENT ILLNESS: [name] is a 82 y.o. male with newly diagnosed multiple myeloma and a separate, early stage NSCLC in the RLL lung.  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 Isabel Caprice and Dr. [name].  He did have a 60-month Lupron injection prior to the procedure to downsize the prostate.  He had an excellent response to treatment with a PSA nadir at 0.26 in June 2018, with no evidence of recurrence, so he was released from urology follow-up at that time.  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  More recently, he had a CT coronary calcium/angiography scan on 08/29/2022 during workup for refractory arrhythmia despite cardioversion.  That scan showed multiple right lung nodules with a spiculated, solid appearing 9 mm nodule in the RLL as well as a lucent area in the left mid-lower thoracic vertebral body.  This was further evaluated with a PET scan which was performed on 09/19/2022 and confirmed hypermetabolic lytic lesion at T10 and an additional lytic lesion in the left femoral neck but no blastic lesions noted.  The pulmonary nodules were not hypermetabolic but still felt the nodule in the RLL was highly suspicious given its small size which  likely follows below PET size criteria for sensitivity.  He had an MRI of the thoracic spine on 09/27/2022 which confirmed a 2.2 cm metastasis in the T10 vertebral body with subcentimeter deposits in T4, T9 and T11, without fracture or extraosseous extension.  PSA obtained on 10/05/2022 remained very low at 0.4.  Dr. Tonia Brooms discussed the case with Dr. Shirline Frees and they came to the decision to proceed with bone biopsy since it was felt that bronchoscopy biopsy would be low yield given the lack of metabolic activity on the PET scan and small size of the lung lesion.  He underwent bone biopsy with osteo cool RFA and kyphoplasty under the care of Dr. Baldemar Lenis on 10/11/2022 and pathology confirmed multiple myeloma.  We initially met with the patient to discuss potential radiotherapy treatment options on 10/12/2022 but decided to wait and make formal treatment recommendations after an orthopedic consult.  Given the size and location of the left femoral neck metastasis, he met with Dr. August Saucer on 10/19/2022 who did recommend prophylactic fixation and this was performed by his partner, Dr. Roda Shutters, on 10/25/2022.  The patient feels like he is making good progress in recovering from this procedure.  He also underwent bronchoscopy for biopsy of the RLL nodule under the care of Dr. Tonia Brooms on 11/15/2022 and this confirmed atypical cells suspicious for non-small cell carcinoma.  He also had a bone marrow biopsy on  11/17/2022 that confirmed plasma cell myeloma.  He met back with Dr. Shirline Frees on 11/23/2022 and is planning to proceed with systemic therapy with daratumumab subcutaneously weekly, in addition to Velcade on days 1, 4, 8, 11 every 3 weeks as well as Revlimid 25 mg p.o. daily for 14 days every 3 weeks and Decadron 20 mg p.o. weekly with chemotherapy starting 11/30/2022.   He has been kindly referred back to [place] today for further discussion of the potential role of radiotherapy in the management of his  disease.  PREVIOUS RADIATION THERAPY: Yes  10/15/2007: Definitive prostate brachytherapy of the prostate (Dr. Valinda Party. [name])  PAST MEDICAL HISTORY:  Past Medical History:  Diagnosis Date   Atrial fibrillation    CAD (coronary artery disease)    a. s/p MI with LAD stent placement December, 1999 -last stress test November 2008 with no EKG evidence of ischemia  b. cath 07/23/2015 after abnormal ETT and nuc, occluded mid LAD stent with good distal collateral, medical therapy    Cardiomyopathy    Ischemic cardiomyopathy with EF 40% by echo 2005   COVID 2021   mild case   Dyslipidemia    Erectile dysfunction    GERD (gastroesophageal reflux disease)    Hiatal hernia    with esophageal strictures and dilatations in the past - Dr. [name]   History of nuclear stress test    Myoview 12/16: EF 47%, anterior, anteroseptal and inferoseptal defect suggesting infarct with peri-infarct ischemia, intermediate risk   HTN (hypertension)    Hx of seasonal allergies    Hyperlipidemia    Myocardial infarction 08/01/1998   PAD (peripheral artery disease)    Pre-diabetes    Prostate cancer    with seed implant therapy following Lupron therapy in 2008 and 2009 - followed by Dr. Isabel Caprice   Ringworm    Stroke 04/20/2017   seen on a MRI   Vitamin D deficiency       PAST SURGICAL HISTORY: Past Surgical History:  Procedure Laterality Date   BRONCHIAL BIOPSY  11/15/2022   Procedure: BRONCHIAL BIOPSIES;  Surgeon: [name], DO;  Location: [facility];  Service: Pulmonary;;   BRONCHIAL BRUSHINGS  11/15/2022   Procedure: BRONCHIAL BRUSHINGS;  Surgeon: [name], DO;  Location: [facility];  Service: Pulmonary;;   BRONCHIAL NEEDLE ASPIRATION BIOPSY  11/15/2022   Procedure: BRONCHIAL NEEDLE ASPIRATION BIOPSIES;  Surgeon: [name], DO;  Location: [facility];  Service: Pulmonary;;   CARDIAC CATHETERIZATION N/A 07/23/2015   Procedure: Left Heart Cath and Coronary Angiography;   Surgeon: [name], MD;  Location: [facility]VASIVE CV [facility];  Service: Cardiovascular;  Laterality: N/A;   CARDIOVERSION N/A 06/03/2022   Procedure: CARDIOVERSION;  Surgeon: [name], MD;  [facility]: [facility] ENDOSCOPY;  Service: Cardiovascular;  Laterality: N/A;   COLONOSCOPY WITH PROPOFOL N/A 11/24/2015   Procedure: COLONOSCOPY WITH PROPOFOL;  Surgeon: [name], MD;  Location: [facility] ENDOSCOPY;  Service: Endoscopy;  Laterality: N/A;   esophageal strictures with dilatations in the past per Dr. [name]     last '98   FIDUCIAL MARKER PLACEMENT  11/15/2022   Procedure: FIDUCIAL MARKER PLACEMENT;  Surgeon: [name], DO;  Location: [facility] ENDOS[facility];  Service: Pulmonary;;   HERNIA REPAIR Left    INTRAMEDULLARY (IM) NAIL INTERTROCHANTERIC Left 10/25/2022   Procedure: LEFT INTRAMEDULLARY (IM) NAIL INTERTROCHANTERIC;  Surgeon: [name], MD;  Location: [facility] OR;  Service: Orthopedics;  Laterality: Left;   IR BONE MARROW BIOPSY & ASPIRATION  11/17/2022   IR BONE TUMOR(S)RF ABLATION  10/11/2022   IR KYPHO THORACIC WITH BONE BIOPSY  10/11/2022   LAD stent placement - Dr. [name]     left inguinal hernia repair - Dr. [name]     prostatic radioactive seed implantation - Dr. [name]     '09    FAMILY HISTORY:  Family History  Problem Relation Age of Onset   CVA Mother    CVA Father 83   CAD Sister     SOCIAL HISTORY:  Social History   Socioeconomic History   Marital status: Married    Spouse name: ,Talbert Forest   Number of children: 4   Years of education: Not on file   Highest education level: Not on file  Occupational History   Not on file  Tobacco Use   Smoking status: Never    Passive exposure: Past   Smokeless tobacco: Never  Vaping Use   Vaping Use: Never used  Substance and Sexual Activity   Alcohol use: Not Currently   Drug use: No   Sexual activity: Not Currently  Other Topics Concern   Not on file  Social History Narrative   Not on file   Social Determinants of  Health   Financial Resource Strain: Not on file  Food Insecurity: No Food Insecurity (10/12/2022)   Hunger Vital Sign    Worried About Running Out of Food in the Last Year: Never true    Ran Out of Food in the Last Year: Never true  Transportation Needs: No Transportation Needs (10/12/2022)   PRAPARE - Administrator, Civil Service (Medical): No    Lack of Transportation (Non-Medical): No  Physical Activity: Not on file  Stress: Not on file  Social Connections: Not on file  Intimate Partner Violence: Not At Risk (10/12/2022)   Humiliation, Afraid, Rape, and Kick questionnaire    Fear of Current or Ex-Partner: No    Emotionally Abused: No    Physically Abused: No    Sexually Abused: No    ALLERGIES: Patient has no known allergies.  MEDICATIONS:  Current Outpatient Medications  Medication Sig Dispense Refill   acyclovir (ZOVIRAX) 200 MG capsule Take 1 capsule (200 mg total) by mouth 2 (two) times daily. 60 capsule 5   amLODipine (NORVASC) 5 MG tablet Take 1 tablet (5 mg total) by mouth daily. 90 tablet 2   atorvastatin (LIPITOR) 40 MG tablet Take 40 mg by mouth at bedtime.     B Complex Vitamins (B COMPLEX 100) tablet Take 1 tablet by mouth daily. Balance     benazepril-hydrochlorthiazide (LOTENSIN HCT) 20-12.5 MG tablet Take 1 tablet by mouth daily. 90 tablet 2   cetirizine (ZYRTEC) 10 MG tablet Take 10 mg by mouth daily.     Cholecalciferol (VITAMIN D3) 2000 UNITS TABS Take 2,000 Units by mouth daily.     Coenzyme Q10 (CO Q-10) 100 MG CAPS Take 100 mg by mouth daily with supper.     dexamethasone (DECADRON) 4 MG tablet 5 tab weekly start with the first dose of chemotherapy. 40 tablet 3   metoprolol succinate (TOPROL XL) 25 MG 24 hr tablet Take 1 tablet (25 mg total) by mouth daily. 90 tablet 3   Multiple Vitamins-Minerals (CENTRUM) tablet Take 1 tablet by mouth daily.     nitroGLYCERIN (NITROSTAT) 0.4 MG SL tablet PLACE 1 TABLET UNDER THE TONGUE IF NEEDED EVERY 5 MINUTES  FOR CHEST PAIN FOR 3 DOSES IF NO RELIEF AFTER FIRST DOSE  CALL 911. 25 tablet 9   omeprazole (PRILOSEC) 20 MG capsule Take 20 mg by mouth daily before breakfast.     oxyCODONE (OXY IR/ROXICODONE) 5 MG immediate release tablet Take 1-2 tablets (5-10 mg total) by mouth every 6 (six) hours as needed for moderate pain (pain score 4-6). 40 tablet 0   warfarin (COUMADIN) 5 MG tablet TAKE 1/2 TO 1 TABLET BY MOUTH DAILY AS DIRECTED BY COUMADIN CLINIC (Patient taking differently: Take 2.5-5 mg by mouth See admin instructions. Take 2.5 mg by mouth on Monday, Wednesday and Friday and take 5 mg on Tuesday, Thursday, Saturday and Sunday) 100 tablet 0   No current facility-administered medications for this encounter.    REVIEW OF SYSTEMS:  On review of systems, the patient reports that he is doing well overall. He denies any chest pain, shortness of breath, cough, fevers, chills, night sweats, unintended weight changes. He denies any bowel or bladder disturbances, and denies abdominal pain, nausea or vomiting. He denies any new musculoskeletal or joint aches or pains. A complete review of systems is obtained and is otherwise negative.    PHYSICAL EXAM:  Wt Readings from Last 3 Encounters:  11/24/22 190 lb (86.2 kg)  11/23/22 189 lb 14.4 oz (86.1 kg)  11/22/22 187 lb 12.8 oz (85.2 kg)   Temp Readings from Last 3 Encounters:  11/24/22 (!) 96.7 F (35.9 C) (Temporal)  11/23/22 97.9 F (36.6 C) (Oral)  11/17/22 (!) 97 F (36.1 C) (Oral)   BP Readings from Last 3 Encounters:  11/24/22 126/84  11/23/22 117/83  11/22/22 126/76   Pulse Readings from Last 3 Encounters:  11/24/22 85  11/23/22 (!) 56  11/22/22 91   Pain Assessment Pain Score: 0-No pain/10  In general this is a well appearing Caucasian man in no acute distress.  The's alert and oriented x4 and appropriate throughout the examination. Cardiopulmonary assessment is negative for acute distress and the exhibits normal effort.   KPS =  100  100 - Normal; no complaints; no evidence of disease. 90   - Able to carry on normal activity; minor signs or symptoms of disease. 80   - Normal activity with effort; some signs or symptoms of disease. 51   - Cares for self; unable to carry on normal activity or to do active work. 60   - Requires occasional assistance, but is able to care for most of his personal needs. 50   - Requires considerable assistance and frequent medical care. 40   - Disabled; requires special care and assistance. 30   - Severely disabled; hospital admission is indicated although death not imminent. 20   - Very sick; hospital admission necessary; active supportive treatment necessary. 10   - Moribund; fatal processes progressing rapidly. 0     - Dead  Karnofsky DA, Abelmann WH, Craver LS and Burchenal [facility] [phone]) The use of the nitrogen mustards in the palliative treatment of carcinoma: with particular reference to bronchogenic carcinoma Cancer 1 634-56  LABORATORY DATA:  Lab Results  Component Value Date   WBC 9.1 11/23/2022   HGB 15.9 11/23/2022   HCT 46.0 11/23/2022   MCV 98.7 11/23/2022   PLT 163 11/23/2022   Lab Results  Component Value Date   NA 138 11/23/2022   K 4.0 11/23/2022   CL 104 11/23/2022   CO2 30 11/23/2022   Lab Results  Component Value Date   ALT 37 11/23/2022   AST 27 11/23/2022   ALKPHOS 90 11/23/2022  BILITOT 0.7 11/23/2022     RADIOGRAPHY: IR BONE MARROW BIOPSY & ASPIRATION  Result Date: 11/17/2022 INDICATION: Concern for multiple myeloma. Please perform image guided bone marrow biopsy for tissue diagnostic purposes. EXAM: FLUOROSCOPIC GUIDED BONE MARROW BIOPSY AND ASPIRATION MEDICATIONS: None ANESTHESIA/SEDATION: Moderate (conscious) sedation was employed during this procedure as administered by the Interventional Radiology RN. A total of Versed 2 mg and Fentanyl 100 mcg was administered intravenously. Moderate Sedation Time: 10 minutes. The patient's level of  consciousness and vital signs were monitored continuously by radiology nursing throughout the procedure under my direct supervision. FLUOROSCOPY TIME: 6 mGy COMPLICATIONS: None immediate. PROCEDURE: Informed consent was obtained from the patient following an explanation of the procedure, risks, benefits and alternatives. The patient understands, agrees and consents for the procedure. All questions were addressed. A time out was performed prior to the initiation of the procedure. The patient was positioned prone on the fluoroscopy table and the posterior aspect of the right iliac crest was marked fluoroscopically. The operative site was prepped and draped in the usual sterile fashion. Under sterile conditions and local anesthesia, an 11 gauge coaxial bone biopsy needle was advanced into the posterior aspect of the right iliac marrow space under intermittent fluoroscopic guidance. Multiple fluoroscopic images were saved procedural documentation purposes. Initially, a bone marrow aspiration was performed. Next, a bone marrow biopsy was obtained with the 11 gauge outer bone marrow device. Samples were prepared with the cytotechnologist and deemed adequate. The needle was removed and superficial hemostasis was obtained with manual compression. A dressing was applied. The patient tolerated the procedure well without immediate post procedural complication. IMPRESSION: Successful fluoroscopic guided right iliac bone marrow aspiration and core biopsy. Electronically Signed   By: Simonne Come M.D.   On: 11/17/2022 09:25   DG Chest Port 1 View  Result Date: 11/15/2022 CLINICAL DATA:  Status post bronchoscopy with biopsy EXAM: PORTABLE CHEST 1 VIEW COMPARISON:  CT Chest 11/11/22 FINDINGS: No pleural effusion. No pneumothorax. Normal cardiac and mediastinal contours. No focal airspace opacity. No radiographically apparent displaced rib fractures. Visualized upper abdomen is unremarkable. IMPRESSION: No focal airspace opacity.   No pneumothorax. Electronically Signed   By: Lorenza Cambridge M.D.   On: 11/15/2022 08:48   DG C-ARM BRONCHOSCOPY  Result Date: 11/15/2022 C-ARM BRONCHOSCOPY: Fluoroscopy was utilized by the requesting physician.  No radiographic interpretation.   CT Super D Chest Wo Contrast  Result Date: 11/11/2022 CLINICAL DATA:  Lung nodule EXAM: CT CHEST WITHOUT CONTRAST TECHNIQUE: Multidetector CT imaging of the chest was performed using thin slice collimation for electromagnetic bronchoscopy planning purposes, without intravenous contrast. RADIATION DOSE REDUCTION: This exam was performed according to the departmental dose-optimization program which includes automated exposure control, adjustment of the mA and/or kV according to patient size and/or use of iterative reconstruction technique. COMPARISON:  Chest CT dated January 24th 2024; PET-CT dated September 19, 2022 FINDINGS: Cardiovascular: Normal heart size. No pericardial effusion. Normal caliber thoracic aorta with mild calcified plaque. Subendocardial fat deposition and calcifications of the left ventricular apex, consistent with prior MI. Severe three-vessel coronary artery calcifications. Mediastinum/Nodes: Small hiatal hernia. Thyroid is unremarkable. No enlarged lymph nodes seen in the chest. Lungs/Pleura: Central airways are patent. No consolidation, pleural effusion or pneumothorax. Spiculated solid pulmonary nodule of the right lower lobe measures 10 x 6 mm on series 4 image 16 causing occlusion of the medial basal right lower lobe bronchus, unchanged when compared with prior exam. Additional scattered smaller solid pulmonary nodules are stable. Reference nodule  of the right lower lobe measuring 3 mm on image 108. Upper Abdomen: No acute abnormality. Musculoskeletal: Kyphoplasty changes of T10. No aggressive appearing osseous lesions. IMPRESSION: 1. Stable spiculated solid pulmonary nodule of the right lower lobe. Recommend continued follow-up in 3 months.  Alternatively, nodule has an endobronchial component and bronchoscopy could be considered. 2. Severe three-vessel coronary artery calcifications. 3. Aortic Atherosclerosis (ICD10-I70.0). Electronically Signed   By: Allegra Lai M.D.   On: 11/11/2022 14:38   XR HIP UNILAT W OR W/O PELVIS 2-3 VIEWS LEFT  Result Date: 11/08/2022 X-rays demonstrate stable alignment of the hardware without complication  XR FEMUR MIN 2 VIEWS LEFT  Result Date: 11/08/2022 X-rays demonstrate stable alignment of the hardware without complication     IMPRESSION/PLAN: 1. 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  Today, we talked to the patient and his wife, Talbert Forest, about the findings and workup thus far. We discussed the natural history and general treatment of stage I NSCLC and multiple myeloma, highlighting the role of radiotherapy in the management.  We discussed the available radiation techniques, and focused on the details and logistics of delivery. The recommendation is for a 3 fraction course of SBRT to the RLL lung nodule and a 2-week course of daily postop radiation to the disease at T10 and the left femur. We reviewed the anticipated acute and late sequelae associated with radiation in this setting. The patient was encouraged to ask questions that were answered to his stated satisfaction.   At the conclusion of our conversation, the patient is in agreement to proceed with the recommended 3 fraction course of SBRT to the RLL lung nodule and a 2-week course of daily postop radiation to the disease at T10 and the left femur. He appears to have a good understanding of his disease and our current recommendations and is comfortable and in agreement with the stated plan.  He has freely signed written consent to proceed today in the office and a copy of this document will be placed in his medical record.  We will share our discussion with Dr.  Shirline Frees and proceed with treatment planning accordingly, with CT simulation at 8 AM on Tuesday, 11/29/2022, in anticipation of beginning his treatments in the near future.  He will also proceed with systemic therapy for his multiple myeloma as planned under the care of Dr. Arbutus Ped on 11/30/2022.  We enjoyed meeting with him and his wife again today and look forward to continuing to participate in his care.  We personally spent 45 minutes in this encounter including chart review, reviewing radiological studies, meeting face-to-face with the patient, entering orders and completing documentation.    Marguarite Arbour, PA-C    Margaretmary Dys, MD  [facility]  Radiation Oncology Direct Dial: [phone]  Fax: [phone] [email or web address]  Skype  LinkedIn

## 2022-11-24 NOTE — Progress Notes (Signed)
Faxed med list to [facility].

## 2022-11-25 ENCOUNTER — Encounter: Payer: Self-pay | Admitting: Acute Care

## 2022-11-25 ENCOUNTER — Encounter (HOSPITAL_COMMUNITY): Payer: Self-pay | Admitting: Internal Medicine

## 2022-11-25 ENCOUNTER — Other Ambulatory Visit: Payer: Self-pay

## 2022-11-25 ENCOUNTER — Other Ambulatory Visit: Payer: Medicare Other

## 2022-11-29 ENCOUNTER — Encounter: Payer: Self-pay | Admitting: Cardiovascular Disease

## 2022-11-29 ENCOUNTER — Encounter (HOSPITAL_COMMUNITY): Payer: Self-pay | Admitting: Internal Medicine

## 2022-11-29 ENCOUNTER — Telehealth: Payer: Self-pay | Admitting: Internal Medicine

## 2022-11-29 ENCOUNTER — Other Ambulatory Visit: Payer: Self-pay

## 2022-11-29 ENCOUNTER — Ambulatory Visit
Admission: RE | Admit: 2022-11-29 | Discharge: 2022-11-29 | Disposition: A | Discharge status: Home / Self Care | Payer: Medicare Other | Source: Ambulatory Visit | Attending: Radiation Oncology | Admitting: Radiation Oncology

## 2022-11-29 ENCOUNTER — Ambulatory Visit: Payer: Medicare Other | Attending: Cardiovascular Disease | Admitting: Cardiovascular Disease

## 2022-11-29 VITALS — BP 112/80 | HR 87 | Ht 72.0 in | Wt 189.0 lb

## 2022-11-29 DIAGNOSIS — C3431 Malignant neoplasm of lower lobe, right bronchus or lung: Secondary | ICD-10-CM | POA: Insufficient documentation

## 2022-11-29 DIAGNOSIS — C9000 Multiple myeloma not having achieved remission: Secondary | ICD-10-CM

## 2022-11-29 DIAGNOSIS — I4819 Other persistent atrial fibrillation: Secondary | ICD-10-CM

## 2022-11-29 DIAGNOSIS — C3491 Malignant neoplasm of unspecified part of right bronchus or lung: Secondary | ICD-10-CM

## 2022-11-29 DIAGNOSIS — C61 Malignant neoplasm of prostate: Secondary | ICD-10-CM

## 2022-11-29 NOTE — Patient Instructions (Signed)
Medication Instructions:  Your physician recommends that you continue on your current medications as directed. Please refer to the Current Medication list given to you today. *If you need a refill on your cardiac medications before your next appointment, please call your pharmacy*   Follow-Up: At [facility], you and your health needs are our priority.  As part of our continuing mission to provide you with exceptional heart care, we have created designated Provider Care Teams.  These Care Teams include your primary Cardiologist (physician) and Advanced Practice Providers (APPs -  Physician Assistants and Nurse Practitioners) who all work together to provide you with the care you need, when you need it.  We recommend signing up for the patient portal called "MyChart".  Sign up information is provided on this After Visit Summary.  MyChart is used to connect with patients for Virtual Visits (Telemedicine).  Patients are able to view lab/test results, encounter notes, upcoming appointments, etc.  Non-urgent messages can be sent to your provider as well.   To learn more about what you can do with MyChart, go to [email or web address].    Your next appointment:   With Dr Anne Fu when available  Provider:   Follow up with Dr Anne Fu

## 2022-11-29 NOTE — Progress Notes (Signed)
Electrophysiology Office Note:    Date:  11/29/2022   ID:  Nayson, Traweek [birth year], MRN [id number]  PCP:  [name], MD (Inactive)   [facility] Providers Cardiologist:  Lesleigh Noe, MD (Inactive)     Referring MD: [name], MD    History of Present Illness:    Ronald Duran is a 82 y.o. 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  AF was detected incidentally in September 2023 by his PCP. The previous normal exam was in May. The patient has not noticed any palpitations or change in his exercise tolerance. He had a cardioversion but quickly reverted back to AF.  He was referred to consider AF ablation and rhythm control due to a history of cardiomyopathy.  Scheduled for atrial fibrillation ablation in January 2024.  The chest CT, however detected multiple pulmonary nodules, the largest 9 mm diameter was suspicious for possible malignancy.  The ablation was canceled.  He had a biopsy of a lytic bone lesion.  He had a fixation for a pending left hip fracture and has received palliative radiotherapy.   Today, he denies any significant dyspnea, chest pain, palpitations.    Past Medical History:  Diagnosis Date   Atrial fibrillation    CAD (coronary artery disease)    a. s/p MI with LAD stent placement December, 1999 -last stress test November 2008 with no EKG evidence of ischemia  b. cath 07/23/2015 after abnormal ETT and nuc, occluded mid LAD stent with good distal collateral, medical therapy    Cardiomyopathy    Ischemic cardiomyopathy with EF 40% by echo 2005   COVID 2021   mild case   Dyslipidemia    Erectile dysfunction    GERD (gastroesophageal reflux disease)    Hiatal hernia    with esophageal strictures and dilatations in the past - Dr. [name]   History of nuclear stress test    Myoview 12/16: EF 47%, anterior, anteroseptal and  inferoseptal defect suggesting infarct with peri-infarct ischemia, intermediate risk   HTN (hypertension)    Hx of seasonal allergies    Hyperlipidemia    Myocardial infarction 08/01/1998   PAD (peripheral artery disease)    Pre-diabetes    Prostate cancer    with seed implant therapy following Lupron therapy in 2008 and 2009 - followed by Dr. Isabel Caprice   Ringworm    Stroke 04/20/2017   seen on a MRI   Vitamin D deficiency     Past Surgical History:  Procedure Laterality Date   BRONCHIAL BIOPSY  11/15/2022   Procedure: BRONCHIAL BIOPSIES;  Surgeon: [name], DO;  Location: [facility];  Service: Pulmonary;;   BRONCHIAL BRUSHINGS  11/15/2022   Procedure: BRONCHIAL BRUSHINGS;  Surgeon: [name], DO;  Location: [facility];  Service: Pulmonary;;   BRONCHIAL NEEDLE ASPIRATION BIOPSY  11/15/2022   Procedure: BRONCHIAL NEEDLE ASPIRATION BIOPSIES;  Surgeon: [name], DO;  Location: [facility];  Service: Pulmonary;;   CARDIAC CATHETERIZATION N/A 07/23/2015   Procedure: Left Heart Cath and Coronary Angiography;  Surgeon: [name], MD;  Location: [facility] INVASIVE CV LAB;  Service: Cardiovascular;  Laterality: N/A;   CARDIOVERSION N/A 06/03/2022   Procedure: CARDIOVERSION;  Surgeon: [name], MD;  Location: [facility]OSCOPY;  Service: Cardiovascular;  Laterality: N/A;   COLONOSCOPY WITH PROPOFOL N/A 11/24/2015   Procedure: COLONOSCOPY WITH PROPOFOL;  Surgeon: Jone Baseman  Laural Benes, MD;  Location: [facility] ENDOSCOPY;  Service: Endoscopy;  Laterality: N/A;   esophageal strictures with dilatations in the past per Dr. [name]     last '98   FIDUCIAL MARKER PLACEMENT  11/15/2022   Procedure: FIDUCIAL MARKER PLACEMENT;  Surgeon: [name], DO;  Location: [facility];  Service: Pulmonary;;   HERNIA REPAIR Left    INTRAMEDULLARY (IM) NAIL INTERTROCHANTERIC Left 10/25/2022   Procedure: LEFT INTRAMEDULLARY (IM) NAIL INTERTROCHANTERIC;  Surgeon: [name], MD;  Location: [facility] OR;   Service: Orthopedics;  Laterality: Left;   IR BONE MARROW BIOPSY & ASPIRATION  11/17/2022   IR BONE TUMOR(S)RF ABLATION  10/11/2022   IR KYPHO THORACIC WITH BONE BIOPSY  10/11/2022   LAD stent placement - Dr. [name]     left inguinal hernia repair - Dr. [name]     prostatic radioactive seed implantation - Dr. [name]     '09    Current Medications: Current Meds  Medication Sig   acyclovir (ZOVIRAX) 200 MG capsule Take 1 capsule (200 mg total) by mouth 2 (two) times daily.   amLODipine (NORVASC) 5 MG tablet Take 1 tablet (5 mg total) by mouth daily.   atorvastatin (LIPITOR) 40 MG tablet Take 40 mg by mouth at bedtime.   B Complex Vitamins (B COMPLEX 100) tablet Take 1 tablet by mouth daily. Balance   benazepril-hydrochlorthiazide (LOTENSIN HCT) 20-12.5 MG tablet Take 1 tablet by mouth daily.   cetirizine (ZYRTEC) 10 MG tablet Take 10 mg by mouth daily.   Cholecalciferol (VITAMIN D3) 2000 UNITS TABS Take 2,000 Units by mouth daily.   Coenzyme Q10 (CO Q-10) 100 MG CAPS Take 100 mg by mouth daily with supper.   dexamethasone (DECADRON) 4 MG tablet 5 tab weekly start with the first dose of chemotherapy.   lenalidomide (REVLIMID) 25 MG capsule Take 1 capsule (25 mg total) by mouth daily. Take for 14 days, then none for 7 days. Repeat every 21 days. Start Revlimid on day 1 of chemotherapy   metoprolol succinate (TOPROL XL) 25 MG 24 hr tablet Take 1 tablet (25 mg total) by mouth daily.   Multiple Vitamins-Minerals (CENTRUM) tablet Take 1 tablet by mouth daily.   nitroGLYCERIN (NITROSTAT) 0.4 MG SL tablet PLACE 1 TABLET UNDER THE TONGUE IF NEEDED EVERY 5 MINUTES FOR CHEST PAIN FOR 3 DOSES IF NO RELIEF AFTER FIRST DOSE CALL 911.   omeprazole (PRILOSEC) 20 MG capsule Take 20 mg by mouth daily before breakfast.   oxyCODONE (OXY IR/ROXICODONE) 5 MG immediate release tablet Take 1-2 tablets (5-10 mg total) by mouth every 6 (six) hours as needed for moderate pain (pain score 4-6).    prochlorperazine (COMPAZINE) 10 MG tablet Take 1 tablet (10 mg total) by mouth every 6 (six) hours as needed for nausea or vomiting.   warfarin (COUMADIN) 5 MG tablet TAKE 1/2 TO 1 TABLET BY MOUTH DAILY AS DIRECTED BY COUMADIN CLINIC (Patient taking differently: Take 2.5-5 mg by mouth See admin instructions. Take 2.5 mg by mouth on Monday, Wednesday and Friday and take 5 mg on Tuesday, Thursday, Saturday and Sunday)     Allergies:   Patient has no known allergies.   Social History   Socioeconomic History   Marital status: Married    Spouse name: ,Talbert Forest   Number of children: 4   Years of education: Not on file   Highest education level: Not on file  Occupational History   Not on file  Tobacco Use  Smoking status: Never    Passive exposure: Past   Smokeless tobacco: Never  Vaping Use   Vaping Use: Never used  Substance and Sexual Activity   Alcohol use: Not Currently   Drug use: No   Sexual activity: Not Currently  Other Topics Concern   Not on file  Social History Narrative   Not on file   Social Determinants of Health   Financial Resource Strain: Not on file  Food Insecurity: No Food Insecurity (10/12/2022)   Hunger Vital Sign    Worried About Running Out of Food in the Last Year: Never true    Ran Out of Food in the Last Year: Never true  Transportation Needs: No Transportation Needs (10/12/2022)   PRAPARE - Administrator, Civil Service (Medical): No    Lack of Transportation (Non-Medical): No  Physical Activity: Not on file  Stress: Not on file  Social Connections: Not on file     Family History: The patient's family history includes CAD in his sister; CVA in his mother; CVA (age of onset: 64) in his father.  ROS:   Please see the history of present illness.    All other systems reviewed and are negative.  EKGs/Labs/Other Studies Reviewed Today:     TTE 10/21/2021 EF 45-50%, improved from 40% in 2018  EKG:  Last EKG results: today - AF, old  anterior MI   Recent Labs: 11/23/2022: ALT 37; BUN 24; Creatinine 1.05; Hemoglobin 15.9; Platelet Count 163; Potassium 4.0; Sodium 138     Physical Exam:    VS:  BP 112/80   Pulse 87   Ht 6' (1.829 m)   Wt 189 lb (85.7 kg)   SpO2 98%   BMI 25.63 kg/m     Wt Readings from Last 3 Encounters:  11/29/22 189 lb (85.7 kg)  11/24/22 190 lb (86.2 kg)  11/23/22 189 lb 14.4 oz (86.1 kg)     GEN: Well nourished, well developed in no acute distress CARDIAC: Irregular rhythm, no murmurs, rubs, gallops RESPIRATORY:  Normal work of breathing MUSCULOSKELETAL: no edema    ASSESSMENT & PLAN:    Persistent atrial fibrillation:  Mild, if any, symptoms At this point, since he is about to undergo chemotherapy and radiation, I will defer plans for rhythm control.  Multiple myeloma, Stage IA NSCLC of right lung  Secondary hypercoagulable state:  Continue warfarin given data from FRAIL-AF.   Cardiomyopathy: improved. EF 45-50%  CAD  Sinus bradycardia: may limit medical management of AF        Medication Adjustments/Labs and Tests Ordered: Current medicines are reviewed at length with the patient today.  Concerns regarding medicines are outlined above.  No orders of the defined types were placed in this encounter.  No orders of the defined types were placed in this encounter.    Signed, Maurice Small, MD  11/29/2022 12:12 PM    [facility]

## 2022-11-29 NOTE — Telephone Encounter (Signed)
Called patient regarding upcoming April/May/June appointments, patient is notified. 

## 2022-11-29 NOTE — Progress Notes (Signed)
Pharmacist Chemotherapy Monitoring - Initial Assessment    Anticipated start date: 12/06/22   The following has been reviewed per standard work regarding the patient's treatment regimen: The patient's diagnosis, treatment plan and drug doses, and organ/hematologic function Lab orders and baseline tests specific to treatment regimen  The treatment plan start date, drug sequencing, and pre-medications Prior authorization status  Patient's documented medication list, including drug-drug interaction screen and prescriptions for anti-emetics and supportive care specific to the treatment regimen The drug concentrations, fluid compatibility, administration routes, and timing of the medications to be used The patient's access for treatment and lifetime cumulative dose history, if applicable  The patient's medication allergies and previous infusion related reactions, if applicable   Changes made to treatment plan:  Pre-tx lab ordered to monitor Pre-tx RBC with Daratumumab  Follow up needed:  adding lab orders   Ebony Hail, Pharm.D., CPP 11/29/2022@12 :07 PM

## 2022-11-29 NOTE — Telephone Encounter (Signed)
Oral Chemotherapy Pharmacist Encounter  I spoke with patient for overview of: Revlimid for the treatment of multiple myeloma in conjunction with daratumumab, velcade and dexamethasone, planned duration until disease progression or unacceptable drug toxicity.  Counseled patient on administration, dosing, side effects, monitoring, drug-food interactions, safe handling, storage, and disposal.  Patient will take Revlimid  capsules, 1 capsule by mouth once daily, without regard to food, with a full glass of water.  Revlimid will be given 14 days on, 7 days off, repeat every 21 days.  Patient will take dexamethasone  tablets, 5 tablets ( ) by mouth once weekly with breakfast on AM of daratumumab injections.  Revlimid start date: 12/06/22  Adverse effects of Revlimid include but are not limited to: nausea, GI upset, abdominal pain, rash, fatigue, and decreased blood counts.   VZV PPX: Patient has already picked up and started acyclovir VTE PPX: Patient continues on warfarin for afib - we discussed that this is appropriate coverage as well for VTE PPX. I discussed with Mr. And Ms. Haynesworth that dexamethasone may increase INRs, so it will be important that his INR is followed closely after starting the dexamethasone to ensure he does not need his warfarin dose adjusted.  Reviewed with patient importance of keeping a medication schedule and plan for any missed doses. No barriers to medication adherence identified.  Medication reconciliation performed and medication/allergy list updated.  Insurance authorization for Revlimid has been obtained. Revlimid prescription is being dispensed from [facility] as it is a limited distribution medication. This will deliver to patient's home on 11/30/22. He knows not to start the Revlimid until 12/06/22.  All questions answered.  Mr. And Ms. Guinta voiced understanding and appreciation.   Medication education handout placed in mail for  patient. Patient knows to call the office with questions or concerns. Oral Chemotherapy Clinic phone number provided to patient.   Lenord Carbo, PharmD, BCPS, BCOP Hematology/Oncology Clinical Pharmacist [facility] Oral Chemotherapy Navigation Clinics [phone] 11/29/2022 2:26 PM

## 2022-11-30 ENCOUNTER — Other Ambulatory Visit: Payer: Medicare Other

## 2022-11-30 ENCOUNTER — Ambulatory Visit: Payer: Medicare Other

## 2022-12-01 NOTE — Progress Notes (Signed)
  Radiation Oncology         [phone]) [phone] ________________________________  Name: Ronald Duran MRN: [id number]  Date: 11/29/2022  DOB: May 01, 1941  STEREOTACTIC BODY RADIOTHERAPY SIMULATION AND TREATMENT PLANNING NOTE    ICD-10-CM   1. Multiple myeloma without remission  C90.00     2. Non-small cell cancer of right lung  C34.91       DIAGNOSIS:  82 y/o man with multiple myeloma involving the thoracic spine and left femur and a Stage IA, NSCLC in the RLL lung.  NARRATIVE:  The patient was brought to the CT Simulation planning suite.  Identity was confirmed.  All relevant records and images related to the planned course of therapy were reviewed.  The patient freely provided informed written consent to proceed with treatment after reviewing the details related to the planned course of therapy. The consent form was witnessed and verified by the simulation staff.  Then, the patient was set-up in a stable reproducible  supine position for radiation therapy.  A BodyFix immobilization pillow was fabricated for reproducible positioning.  Then I personally applied the abdominal compression paddle to limit respiratory excursion.  4D respiratoy motion management CT images were obtained.  Surface markings were placed.  The CT images were loaded into the planning software.  Then, using Cine, MIP, and standard views, the internal target volume (ITV) and planning target volumes (PTV) were delinieated, and avoidance structures were contoured.  Treatment planning then occurred.  The radiation prescription was entered and confirmed.  A total of two complex treatment devices were fabricated in the form of the BodyFix immobilization pillow and a neck accuform cushion.  I have requested : 3D Simulation  I have requested a DVH of the following structures: Heart, Lungs, Esophagus, Chest Wall, Brachial Plexus, Major Blood Vessels, and targets.  Simulation was also performed for the left femoral neck lesion and  thoracic spine.  SPECIAL TREATMENT PROCEDURE:  The planned course of therapy using SBRT radiation to the lung constitutes a special treatment procedure. Special care is required in the management of this patient for the following reasons. This treatment constitutes a Special Treatment Procedure for the following reason: [ High dose per fraction requiring special monitoring for increased toxicities of treatment including daily imaging..  The special nature of the planned course of radiotherapy will require increased physician supervision and oversight to ensure patient's safety with optimal treatment outcomes.  This requires extended time and effort.    RESPIRATORY MOTION MANAGEMENT SIMULATION:  In order to account for effect of respiratory motion on target structures and other organs in the planning and delivery of radiotherapy, this patient underwent respiratory motion management simulation.  To accomplish this, when the patient was brought to the CT simulation planning suite, 4D respiratoy motion management CT images were obtained.  The CT images were loaded into the planning software.  Then, using a variety of tools including Cine, MIP, and standard views, the target volume and planning target volumes (PTV) were delineated.  Avoidance structures were contoured.  Treatment planning then occurred.  Dose volume histograms were generated and reviewed for each of the requested structure.  The resulting plan was carefully reviewed and approved today.  PLAN:  The patient will receive 54 Gy in 3 fractions to the lung with 20 Gy in 10 fractions to the left hip and T10 spine.  ________________________________  Artist Pais Kathrynn Running, M.D.

## 2022-12-02 ENCOUNTER — Other Ambulatory Visit: Payer: Self-pay

## 2022-12-02 DIAGNOSIS — C3431 Malignant neoplasm of lower lobe, right bronchus or lung: Secondary | ICD-10-CM | POA: Diagnosis not present

## 2022-12-05 ENCOUNTER — Ambulatory Visit: Payer: Medicare Other | Attending: Cardiology | Admitting: Pharmacist

## 2022-12-05 DIAGNOSIS — Z79899 Other long term (current) drug therapy: Secondary | ICD-10-CM | POA: Diagnosis not present

## 2022-12-05 DIAGNOSIS — I6349 Cerebral infarction due to embolism of other cerebral artery: Secondary | ICD-10-CM | POA: Diagnosis not present

## 2022-12-05 DIAGNOSIS — Z7901 Long term (current) use of anticoagulants: Secondary | ICD-10-CM

## 2022-12-05 DIAGNOSIS — I4819 Other persistent atrial fibrillation: Secondary | ICD-10-CM | POA: Diagnosis not present

## 2022-12-05 LAB — POCT INR: INR: 2.6 (ref 2.0–3.0)

## 2022-12-05 NOTE — Progress Notes (Signed)
Starting multiple myeloma treatment tomorrow.  Ran interaction check. Dexamethasone and warfarin only interaction amongst new chemo meds

## 2022-12-05 NOTE — Patient Instructions (Signed)
Description   Continue to take warfarin 1 tablet daily except for 1/2 a tablet on Monday, Wednesday and Friday.  Continue eating broccoli only every 2-3 days per week.  Recheck INR 2 weeks  Anticoagulation [phone].

## 2022-12-06 ENCOUNTER — Encounter: Payer: Self-pay | Admitting: Medical Oncology

## 2022-12-06 ENCOUNTER — Inpatient Hospital Stay: Payer: Medicare Other

## 2022-12-06 ENCOUNTER — Encounter: Payer: Self-pay | Admitting: Physician Assistant

## 2022-12-06 ENCOUNTER — Ambulatory Visit (INDEPENDENT_AMBULATORY_CARE_PROVIDER_SITE_OTHER): Payer: Medicare Other | Admitting: Physician Assistant

## 2022-12-06 ENCOUNTER — Ambulatory Visit (INDEPENDENT_AMBULATORY_CARE_PROVIDER_SITE_OTHER): Payer: Medicare Other

## 2022-12-06 VITALS — BP 106/69 | HR 87 | Temp 97.7°F | Resp 18 | Wt 191.0 lb

## 2022-12-06 DIAGNOSIS — C801 Malignant (primary) neoplasm, unspecified: Secondary | ICD-10-CM

## 2022-12-06 DIAGNOSIS — C7951 Secondary malignant neoplasm of bone: Secondary | ICD-10-CM

## 2022-12-06 DIAGNOSIS — C9000 Multiple myeloma not having achieved remission: Secondary | ICD-10-CM

## 2022-12-06 DIAGNOSIS — Z5112 Encounter for antineoplastic immunotherapy: Secondary | ICD-10-CM | POA: Diagnosis not present

## 2022-12-06 LAB — TYPE AND SCREEN
ABO/RH(D): A POS
Antibody Screen: NEGATIVE

## 2022-12-06 LAB — CMP (CANCER CENTER ONLY)
ALT: 27 U/L (ref 0–44)
AST: 22 U/L (ref 15–41)
Albumin: 3.8 g/dL (ref 3.5–5.0)
Alkaline Phosphatase: 89 U/L (ref 38–126)
Anion gap: 6 (ref 5–15)
BUN: 16 mg/dL (ref 8–23)
CO2: 27 mmol/L (ref 22–32)
Calcium: 9.8 mg/dL (ref 8.9–10.3)
Chloride: 102 mmol/L (ref 98–111)
Creatinine: 1.02 mg/dL (ref 0.61–1.24)
GFR, Estimated: >60 mL/min (ref >60)
Glucose, Bld: 148 mg/dL — ABNORMAL HIGH (ref 70–99)
Potassium: 3.7 mmol/L (ref 3.5–5.1)
Sodium: 135 mmol/L (ref 135–145)
Total Bilirubin: 1.1 mg/dL (ref 0.3–1.2)
Total Protein: 7.7 g/dL (ref 6.5–8.1)

## 2022-12-06 LAB — CBC WITH DIFFERENTIAL (CANCER CENTER ONLY)
Abs Immature Granulocytes: 0.03 10*3/uL (ref 0.00–0.07)
Basophils Absolute: 0 10*3/uL (ref 0.0–0.1)
Basophils Relative: 0 %
Eosinophils Absolute: 0 10*3/uL (ref 0.0–0.5)
Eosinophils Relative: 0 %
HCT: 44.7 % (ref 39.0–52.0)
Hemoglobin: 15.6 g/dL (ref 13.0–17.0)
Immature Granulocytes: 0 %
Lymphocytes Relative: 4 %
Lymphs Abs: 0.4 10*3/uL — ABNORMAL LOW (ref 0.7–4.0)
MCH: 34.1 pg — ABNORMAL HIGH (ref 26.0–34.0)
MCHC: 34.9 g/dL (ref 30.0–36.0)
MCV: 97.8 fL (ref 80.0–100.0)
Monocytes Absolute: 0.1 10*3/uL (ref 0.1–1.0)
Monocytes Relative: 1 %
Neutro Abs: 8.1 10*3/uL — ABNORMAL HIGH (ref 1.7–7.7)
Neutrophils Relative %: 95 %
Platelet Count: 166 10*3/uL (ref 150–400)
RBC: 4.57 MIL/uL (ref 4.22–5.81)
RDW: 13.2 % (ref 11.5–15.5)
WBC Count: 8.6 10*3/uL (ref 4.0–10.5)
nRBC: 0 % (ref 0.0–0.2)

## 2022-12-06 MED ORDER — BORTEZOMIB CHEMO SQ INJECTION 3.5 MG (2.5MG/ML)
1.3000 mg/m2 | Freq: Once | INTRAMUSCULAR | Status: AC
  Administered 2022-12-06: 2.75 mg via SUBCUTANEOUS
  Filled 2022-12-06: qty 1.1

## 2022-12-06 MED ORDER — DARATUMUMAB-HYALURONIDASE-FIHJ 1800-30000 MG-UT/15ML ~~LOC~~ SOLN
1800.0000 mg | Freq: Once | SUBCUTANEOUS | Status: AC
  Administered 2022-12-06: 1800 mg via SUBCUTANEOUS
  Filled 2022-12-06: qty 15

## 2022-12-06 MED ORDER — DIPHENHYDRAMINE HCL 25 MG PO CAPS
50.0000 mg | ORAL_CAPSULE | Freq: Once | ORAL | Status: AC
  Administered 2022-12-06: 50 mg via ORAL
  Filled 2022-12-06: qty 2

## 2022-12-06 MED ORDER — DEXAMETHASONE 4 MG PO TABS
20.0000 mg | ORAL_TABLET | Freq: Once | ORAL | Status: AC
  Administered 2022-12-06: 20 mg via ORAL
  Filled 2022-12-06: qty 5

## 2022-12-06 MED ORDER — ACETAMINOPHEN 325 MG PO TABS
650.0000 mg | ORAL_TABLET | Freq: Once | ORAL | Status: AC
  Administered 2022-12-06: 650 mg via ORAL
  Filled 2022-12-06: qty 2

## 2022-12-06 MED ORDER — MONTELUKAST SODIUM 10 MG PO TABS
10.0000 mg | ORAL_TABLET | Freq: Once | ORAL | Status: AC
  Administered 2022-12-06: 10 mg via ORAL
  Filled 2022-12-06: qty 1

## 2022-12-06 NOTE — Progress Notes (Signed)
Post-Op Visit Note   Patient: Ronald Duran           Date of Birth: 02-07-1941           MRN: [id number] Visit Date: 12/06/2022 PCP: [name], MD   Assessment & Plan:  Chief Complaint:  Chief Complaint  Patient presents with   Left Leg - Routine Post Op   Visit Diagnoses:  1. 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  While intraoperative reamings were negative for malignancy, he has been diagnosed with multiple myeloma and is scheduled to start radiation treatment to that area this coming Monday.  He has been getting home health physical therapy and is ambulating with a single-point cane.  Overall, doing well.  Examination of the left hip reveals fully healed surgical scars without complication.  Minimal pain with logroll.  No pain with hip flexion.  He is neurovascularly intact distally.  From our standpoint, we feel comfortable allowing him to proceed with radiation treatment to the left hip as his incisions have fully healed.  He will continue with his home exercise program.  He will follow-up with [place] in 6 weeks for recheck.  Call with concerns or questions in the meantime.  Follow-Up Instructions: Return in about 6 weeks (around 01/17/2023).   Orders:  Orders Placed This Encounter  Procedures   XR FEMUR MIN 2 VIEWS LEFT   No orders of the defined types were placed in this encounter.   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 (HCC) 10/12/2022   Atrial fibrillation (HCC) 05/12/2022   Prostate cancer (HCC)    PAD (peripheral artery disease) (HCC)    Dyslipidemia    Cardiomyopathy (HCC)    CAD (coronary artery disease)    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 s/p MI with LAD stent placement December, 1999 -last stress test November 2008 with no EKG evidence of ischemia  b. cath 07/23/2015 after abnormal ETT and nuc, occluded mid LAD stent with good distal collateral, medical therapy    Cardiomyopathy (HCC)    Ischemic cardiomyopathy with EF 40% by echo 2005   COVID 2021   mild case   Dyslipidemia    Erectile dysfunction  GERD (gastroesophageal reflux disease)    Hiatal hernia    with esophageal strictures and dilatations in the past - Dr. [name]   History of nuclear stress test    Myoview 12/16: EF 47%, anterior, anteroseptal and inferoseptal defect suggesting infarct with peri-infarct ischemia, intermediate risk   HTN (hypertension)    Hx of seasonal allergies    Hyperlipidemia    Myocardial infarction (HCC) 08/01/1998   PAD (peripheral artery disease) (HCC)    Pre-diabetes    Prostate cancer (HCC)    with seed implant therapy following Lupron therapy in 2008 and 2009 - followed by Dr. Isabel Caprice    Ringworm    Stroke (HCC) 04/20/2017   seen on a MRI   Vitamin D deficiency     Family History  Problem Relation Age of Onset   CVA Mother    CVA Father 76   CAD Sister     Past Surgical History:  Procedure Laterality Date   BRONCHIAL BIOPSY  11/15/2022   Procedure: BRONCHIAL BIOPSIES;  Surgeon: [name], DO;  Location: [facility] ENDOS[facility];  Service: Pulmonary;;   BRONCHIAL BRUSHINGS  11/15/2022   Procedure: BRONCHIAL BRUSHINGS;  Surgeon: [name], DO;  Location: [facility];  Service: Pulmonary;;   BRONCHIAL NEEDLE ASPIRATION BIOPSY  11/15/2022   Procedure: BRONCHIAL NEEDLE ASPIRATION BIOPSIES;  Surgeon: [name], DO;  Location: [facility] ENDOS[facility];  Service: Pulmonary;;   CARDIAC CATHETERIZATION N/A 07/23/2015   Procedure: Left Heart Cath and Coronary Angiography;  Surgeon: [name], MD;  Location[facility] INVASIVE CV LAB;  Service: Cardiovascular;  Laterality: N/A;   CARDIOVERSION N/A 06/03/2022   Procedure: CARDIOVERSION;  Surgeon: [name], MD;  Location: [facility] ENDOSCOPY;  Service: Cardiovascular;  Laterality: N/A;   COLONOSCOPY WITH PROPOFOL N/A 11/24/2015   Procedure: COLONOSCOPY WITH PROPOFOL;  Surgeon: [name], MD;  Location: [facility] ENDOSCOPY;  Service: Endoscopy;  Laterality: N/A;   esophageal strictures with dilatations in the past per Dr. [name]     last '98   FIDUCIAL MARKER PLACEMENT  11/15/2022   Procedure: FIDUCIAL MARKER PLACEMENT;  Surgeon: [name], DO;  Location: [facility]OS[facility];  Service: Pulmonary;;   HERNIA REPAIR Left    INTRAMEDULLARY (IM) NAIL INTERTROCHANTERIC Left 10/25/2022   Procedure: LEFT INTRAMEDULLARY (IM) NAIL INTERTROCHANTERIC;  Surgeon: [name], MD;  Location: [facility] OR;  Service: Orthopedics;  Laterality: Left;   IR BONE MARROW BIOPSY & ASPIRATION  11/17/2022   IR BONE TUMOR(S)RF ABLATION  10/11/2022   IR KYPHO THORACIC WITH BONE BIOPSY  10/11/2022   LAD stent placement - Dr. [name]     left inguinal hernia repair - Dr.  [name]     prostatic radioactive seed implantation - Dr. [name]     '09   Social History   Occupational History   Not on file  Tobacco Use   Smoking status: Never    Passive exposure: Past   Smokeless tobacco: Never  Vaping Use   Vaping Use: Never used  Substance and Sexual Activity   Alcohol use: Not Currently   Drug use: No   Sexual activity: Not Currently

## 2022-12-06 NOTE — Progress Notes (Signed)
Dental clearance letter received form Dr Gloris Manchester, DMD to proceed with treatment with Zometa.

## 2022-12-06 NOTE — Patient Instructions (Signed)
[facility]  Discharge Instructions: Thank you for choosing [facility] to provide your oncology and hematology care.   If you have a lab appointment with the Cancer Center, please go directly to the Cancer Center and check in at the registration area.   Wear comfortable clothing and clothing appropriate for easy access to any Portacath or PICC line.   We strive to give you quality time with your provider. You may need to reschedule your appointment if you arrive late (15 or more minutes).  Arriving late affects you and other patients whose appointments are after yours.  Also, if you miss three or more appointments without notifying the office, you may be dismissed from the clinic at the provider's discretion.      For prescription refill requests, have your pharmacy contact our office and allow 72 hours for refills to be completed.    Today you received the following chemotherapy and/or immunotherapy agents Velcade, Daratumumab.      To help prevent nausea and vomiting after your treatment, we encourage you to take your nausea medication as directed.  BELOW ARE SYMPTOMS THAT SHOULD BE REPORTED IMMEDIATELY: *FEVER GREATER THAN 100.4 F (38 C) OR HIGHER *CHILLS OR SWEATING *NAUSEA AND VOMITING THAT IS NOT CONTROLLED WITH YOUR NAUSEA MEDICATION *UNUSUAL SHORTNESS OF BREATH *UNUSUAL BRUISING OR BLEEDING *URINARY PROBLEMS (pain or burning when urinating, or frequent urination) *BOWEL PROBLEMS (unusual diarrhea, constipation, pain near the anus) TENDERNESS IN MOUTH AND THROAT WITH OR WITHOUT PRESENCE OF ULCERS (sore throat, sores in mouth, or a toothache) UNUSUAL RASH, SWELLING OR PAIN  UNUSUAL VAGINAL DISCHARGE OR ITCHING   Items with * indicate a potential emergency and should be followed up as soon as possible or go to the Emergency Department if any problems should occur.  Please show the CHEMOTHERAPY ALERT CARD or IMMUNOTHERAPY ALERT  CARD at check-in to the Emergency Department and triage nurse.  Should you have questions after your visit or need to cancel or reschedule your appointment, please contact [facility]  Dept: [phone]  and follow the prompts.  Office hours are 8:00 a.m. to 4:30 p.m. Monday - Friday. Please note that voicemails left after 4:00 p.m. may not be returned until the following business day.  We are closed weekends and major holidays. You have access to a nurse at all times for urgent questions. Please call the main number to the clinic Dept: [phone] and follow the prompts.   For any non-urgent questions, you may also contact your provider using MyChart. We now offer e-Visits for anyone 70 and older to request care online for non-urgent symptoms. For details visit [email or web address].   Also download the MyChart app! Go to the app store, search "MyChart", open the app, select [place], and log in with your MyChart username and password.  Bortezomib Injection What is this medication? BORTEZOMIB (bor TEZ oh mib) treats lymphoma. It may also be used to treat multiple myeloma, a type of bone marrow cancer. It works by blocking a protein that causes cancer cells to grow and multiply. This helps to slow or stop the spread of cancer cells. This medicine may be used for other purposes; ask your health care provider or pharmacist if you have questions. COMMON BRAND NAME(S): Velcade What should I tell my care team before I take this medication? They need to know if you have any of these conditions: Dehydration Diabetes Heart disease Liver disease  Tingling of the fingers or toes or other nerve disorder An unusual or allergic reaction to bortezomib, other medications, foods, dyes, or preservatives If you or your partner are pregnant or trying to get pregnant Breastfeeding How should I use this medication? This medication is injected into a vein or under the  skin. It is given by your care team in a hospital or clinic setting. Talk to your care team about the use of this medication in children. Special care may be needed. Overdosage: If you think you have taken too much of this medicine contact a poison control center or emergency room at once. NOTE: This medicine is only for you. Do not share this medicine with others. What if I miss a dose? Keep appointments for follow-up doses. It is important not to miss your dose. Call your care team if you are unable to keep an appointment. What may interact with this medication? Ketoconazole Rifampin This list may not describe all possible interactions. Give your health care provider a list of all the medicines, herbs, non-prescription drugs, or dietary supplements you use. Also tell them if you smoke, drink alcohol, or use illegal drugs. Some items may interact with your medicine. What should I watch for while using this medication? Your condition will be monitored carefully while you are receiving this medication. You may need blood work while taking this medication. This medication may affect your coordination, reaction time, or judgment. Do not drive or operate machinery until you know how this medication affects you. Sit up or stand slowly to reduce the risk of dizzy or fainting spells. Drinking alcohol with this medication can increase the risk of these side effects. This medication may increase your risk of getting an infection. Call your care team for advice if you get a fever, chills, sore throat, or other symptoms of a cold or flu. Do not treat yourself. Try to avoid being around people who are sick. Check with your care team if you have severe diarrhea, nausea, and vomiting, or if you sweat a lot. The loss of too much body fluid may make it dangerous for you to take this medication. Talk to your care team if you may be pregnant. Serious birth defects can occur if you take this medication during pregnancy  and for 7 months after the last dose. You will need a negative pregnancy test before starting this medication. Contraception is recommended while taking this medication and for 7 months after the last dose. Your care team can help you find the option that works for you. If your partner can get pregnant, use a condom during sex while taking this medication and for 4 months after the last dose. Do not breastfeed while taking this medication and for 2 months after the last dose. This medication may cause infertility. Talk to your care team if you are concerned about your fertility. What side effects may I notice from receiving this medication? Side effects that you should report to your care team as soon as possible: Allergic reactions--skin rash, itching, hives, swelling of the face, lips, tongue, or throat Bleeding--bloody or black, tar-like stools, vomiting blood or brown material that looks like coffee grounds, red or dark brown urine, small red or purple spots on skin, unusual bruising or bleeding Bleeding in the brain--severe headache, stiff neck, confusion, dizziness, change in vision, numbness or weakness of the face, arm, or leg, trouble speaking, trouble walking, vomiting Bowel blockage--stomach cramping, unable to have a bowel movement or pass gas, loss  of appetite, vomiting Heart failure--shortness of breath, swelling of the ankles, feet, or hands, sudden weight gain, unusual weakness or fatigue Infection--fever, chills, cough, sore throat, wounds that don't heal, pain or trouble when passing urine, general feeling of discomfort or being unwell Liver injury--right upper belly pain, loss of appetite, nausea, light-colored stool, dark yellow or brown urine, yellowing skin or eyes, unusual weakness or fatigue Low blood pressure--dizziness, feeling faint or lightheaded, blurry vision Lung injury--shortness of breath or trouble breathing, cough, spitting up blood, chest pain, fever Pain, tingling,  or numbness in the hands or feet Severe or prolonged diarrhea Stomach pain, bloody diarrhea, pale skin, unusual weakness or fatigue, decrease in the amount of urine, which may be signs of hemolytic uremic syndrome Sudden and severe headache, confusion, change in vision, seizures, which may be signs of posterior reversible encephalopathy syndrome (PRES) TTP--purple spots on the skin or inside the mouth, pale skin, yellowing skin or eyes, unusual weakness or fatigue, fever, fast or irregular heartbeat, confusion, change in vision, trouble speaking, trouble walking Tumor lysis syndrome (TLS)--nausea, vomiting, diarrhea, decrease in the amount of urine, dark urine, unusual weakness or fatigue, confusion, muscle pain or cramps, fast or irregular heartbeat, joint pain Side effects that usually do not require medical attention (report to your care team if they continue or are bothersome): Constipation Diarrhea Fatigue Loss of appetite Nausea This list may not describe all possible side effects. Call your doctor for medical advice about side effects. You may report side effects to FDA at 1-800-FDA-1088. Where should I keep my medication? This medication is given in a hospital or clinic. It will not be stored at home. NOTE: This sheet is a summary. It may not cover all possible information. If you have questions about this medicine, talk to your doctor, pharmacist, or health care provider.  2023 Elsevier/Gold Standard (2021-12-22 00:00:00) Daratumumab; Hyaluronidase Injection What is this medication? DARATUMUMAB; HYALURONIDASE (dar a toom ue mab; hye al ur ON i dase) treats multiple myeloma, a type of bone marrow cancer. Daratumumab works by blocking a protein that causes cancer cells to grow and multiply. This helps to slow or stop the spread of cancer cells. Hyaluronidase works by increasing the absorption of other medications in the body to help them work better. This medication may also be used treat  amyloidosis, a condition that causes the buildup of a protein (amyloid) in your body. It works by reducing the buildup of this protein, which decreases symptoms. It is a combination medication that contains a monoclonal antibody. This medicine may be used for other purposes; ask your health care provider or pharmacist if you have questions. COMMON BRAND NAME(S): DARZALEX FASPRO What should I tell my care team before I take this medication? They need to know if you have any of these conditions: Heart disease Infection, such as chickenpox, cold sores, herpes, hepatitis B Lung or breathing disease An unusual or allergic reaction to daratumumab, hyaluronidase, other medications, foods, dyes, or preservatives Pregnant or trying to get pregnant Breast-feeding How should I use this medication? This medication is injected under the skin. It is given by your care team in a hospital or clinic setting. Talk to your care team about the use of this medication in children. Special care may be needed. Overdosage: If you think you have taken too much of this medicine contact a poison control center or emergency room at once. NOTE: This medicine is only for you. Do not share this medicine with others. What if I  miss a dose? Keep appointments for follow-up doses. It is important not to miss your dose. Call your care team if you are unable to keep an appointment. What may interact with this medication? Interactions have not been studied. This list may not describe all possible interactions. Give your health care provider a list of all the medicines, herbs, non-prescription drugs, or dietary supplements you use. Also tell them if you smoke, drink alcohol, or use illegal drugs. Some items may interact with your medicine. What should I watch for while using this medication? Your condition will be monitored carefully while you are receiving this medication. This medication can cause serious allergic reactions. To  reduce your risk, your care team may give you other medication to take before receiving this one. Be sure to follow the directions from your care team. This medication can affect the results of blood tests to match your blood type. These changes can last for up to 6 months after the final dose. Your care team will do blood tests to match your blood type before you start treatment. Tell all of your care team that you are being treated with this medication before receiving a blood transfusion. This medication can affect the results of some tests used to determine treatment response; extra tests may be needed to evaluate response. Talk to your care team if you wish to become pregnant or think you are pregnant. This medication can cause serious birth defects if taken during pregnancy and for 3 months after the last dose. A reliable form of contraception is recommended while taking this medication and for 3 months after the last dose. Talk to your care team about effective forms of contraception. Do not breast-feed while taking this medication. What side effects may I notice from receiving this medication? Side effects that you should report to your care team as soon as possible: Allergic reactions--skin rash, itching, hives, swelling of the face, lips, tongue, or throat Heart rhythm changes--fast or irregular heartbeat, dizziness, feeling faint or lightheaded, chest pain, trouble breathing Infection--fever, chills, cough, sore throat, wounds that don't heal, pain or trouble when passing urine, general feeling of discomfort or being unwell Infusion reactions--chest pain, shortness of breath or trouble breathing, feeling faint or lightheaded Sudden eye pain or change in vision such as blurry vision, seeing halos around lights, vision loss Unusual bruising or bleeding Side effects that usually do not require medical attention (report to your care team if they continue or are  bothersome): Constipation Diarrhea Fatigue Nausea Pain, tingling, or numbness in the hands or feet Swelling of the ankles, hands, or feet This list may not describe all possible side effects. Call your doctor for medical advice about side effects. You may report side effects to FDA at 1-800-FDA-1088. Where should I keep my medication? This medication is given in a hospital or clinic. It will not be stored at home. NOTE: This sheet is a summary. It may not cover all possible information. If you have questions about this medicine, talk to your doctor, pharmacist, or health care provider.  2023 Elsevier/Gold Standard (2021-11-17 00:00:00)

## 2022-12-06 NOTE — Progress Notes (Signed)
Pt monitored for 2 hours post Darzalex infusion with no complications. VSS at time of discharge.

## 2022-12-07 ENCOUNTER — Encounter: Payer: Self-pay | Admitting: Internal Medicine

## 2022-12-07 LAB — PRETREATMENT RBC PHENOTYPE: ABO/RH(D): A POS

## 2022-12-07 NOTE — Telephone Encounter (Signed)
-----   Message from Rae Roam, RN sent at 12/06/2022  3:20 PM EDT ----- Regarding: Dr Arbutus Ped, first time Velcade and Darzalex Dr Arbutus Ped pt came in on 4/30 for first time Velcade and Darzalex Faspro. Tolerated injections well. Needs call back.

## 2022-12-07 NOTE — Telephone Encounter (Signed)
Called pt to see how he did with his recent treatment.  He reports doing "surprisingly well" & denies any concerns.  He knows how to reach [place] if needed & knows his next appts.

## 2022-12-08 ENCOUNTER — Ambulatory Visit: Payer: Medicare Other

## 2022-12-08 ENCOUNTER — Telehealth: Payer: Self-pay | Admitting: Medical Oncology

## 2022-12-08 NOTE — Telephone Encounter (Signed)
Skin redness  on left abdomen @ Velcade SQ injection site . Denies swelling , warmth , pain, itching. I told pt that this subcutaneous skin reaction is expected from the Velcade and that future injection sites will be irotated to different abd locations. Pt  understands to call if he has any new symptoms.

## 2022-12-09 ENCOUNTER — Inpatient Hospital Stay: Payer: Medicare Other | Attending: Internal Medicine

## 2022-12-09 VITALS — BP 95/67 | HR 83 | Temp 97.7°F | Resp 18

## 2022-12-09 DIAGNOSIS — Z5112 Encounter for antineoplastic immunotherapy: Secondary | ICD-10-CM | POA: Insufficient documentation

## 2022-12-09 DIAGNOSIS — C9000 Multiple myeloma not having achieved remission: Secondary | ICD-10-CM | POA: Diagnosis not present

## 2022-12-09 DIAGNOSIS — C3431 Malignant neoplasm of lower lobe, right bronchus or lung: Secondary | ICD-10-CM | POA: Insufficient documentation

## 2022-12-09 DIAGNOSIS — D6959 Other secondary thrombocytopenia: Secondary | ICD-10-CM | POA: Insufficient documentation

## 2022-12-09 DIAGNOSIS — Z5189 Encounter for other specified aftercare: Secondary | ICD-10-CM | POA: Diagnosis not present

## 2022-12-09 DIAGNOSIS — Z510 Encounter for antineoplastic radiation therapy: Secondary | ICD-10-CM | POA: Insufficient documentation

## 2022-12-09 DIAGNOSIS — C7951 Secondary malignant neoplasm of bone: Secondary | ICD-10-CM | POA: Insufficient documentation

## 2022-12-09 MED ORDER — PROCHLORPERAZINE MALEATE 10 MG PO TABS
10.0000 mg | ORAL_TABLET | Freq: Once | ORAL | Status: AC
  Administered 2022-12-09: 10 mg via ORAL
  Filled 2022-12-09: qty 1

## 2022-12-09 MED ORDER — BORTEZOMIB CHEMO SQ INJECTION 3.5 MG (2.5MG/ML)
1.3000 mg/m2 | Freq: Once | INTRAMUSCULAR | Status: AC
  Administered 2022-12-09: 2.75 mg via SUBCUTANEOUS
  Filled 2022-12-09: qty 1.1

## 2022-12-09 NOTE — Patient Instructions (Signed)
[facility]  Discharge Instructions: Thank you for choosing [facility] to provide your oncology and hematology care.   If you have a lab appointment with the Cancer Center, please go directly to the Cancer Center and check in at the registration area.   Wear comfortable clothing and clothing appropriate for easy access to any Portacath or PICC line.   We strive to give you quality time with your provider. You may need to reschedule your appointment if you arrive late (15 or more minutes).  Arriving late affects you and other patients whose appointments are after yours.  Also, if you miss three or more appointments without notifying the office, you may be dismissed from the clinic at the provider's discretion.      For prescription refill requests, have your pharmacy contact our office and allow 72 hours for refills to be completed.    Today you received the following chemotherapy and/or immunotherapy agents: Velcade     To help prevent nausea and vomiting after your treatment, we encourage you to take your nausea medication as directed.  BELOW ARE SYMPTOMS THAT SHOULD BE REPORTED IMMEDIATELY: *FEVER GREATER THAN 100.4 F (38 C) OR HIGHER *CHILLS OR SWEATING *NAUSEA AND VOMITING THAT IS NOT CONTROLLED WITH YOUR NAUSEA MEDICATION *UNUSUAL SHORTNESS OF BREATH *UNUSUAL BRUISING OR BLEEDING *URINARY PROBLEMS (pain or burning when urinating, or frequent urination) *BOWEL PROBLEMS (unusual diarrhea, constipation, pain near the anus) TENDERNESS IN MOUTH AND THROAT WITH OR WITHOUT PRESENCE OF ULCERS (sore throat, sores in mouth, or a toothache) UNUSUAL RASH, SWELLING OR PAIN  UNUSUAL VAGINAL DISCHARGE OR ITCHING   Items with * indicate a potential emergency and should be followed up as soon as possible or go to the Emergency Department if any problems should occur.  Please show the CHEMOTHERAPY ALERT CARD or IMMUNOTHERAPY ALERT CARD at check-in  to the Emergency Department and triage nurse.  Should you have questions after your visit or need to cancel or reschedule your appointment, please contact [facility] [facility]  Dept: [phone]  and follow the prompts.  Office hours are 8:00 a.m. to 4:30 p.m. Monday - Friday. Please note that voicemails left after 4:00 p.m. may not be returned until the following business day.  We are closed weekends and major holidays. You have access to a nurse at all times for urgent questions. Please call the main number to the clinic Dept: [phone] and follow the prompts.   For any non-urgent questions, you may also contact your provider using MyChart. We now offer e-Visits for anyone 18 and older to request care online for non-urgent symptoms. For details visit [email or web address].   Also download the MyChart app! Go to the app store, search "MyChart", open the app, select , and log in with your MyChart username and password.   

## 2022-12-12 ENCOUNTER — Ambulatory Visit: Payer: Medicare Other | Admitting: Internal Medicine

## 2022-12-12 ENCOUNTER — Other Ambulatory Visit: Payer: Self-pay

## 2022-12-12 ENCOUNTER — Other Ambulatory Visit: Payer: Medicare Other

## 2022-12-12 ENCOUNTER — Ambulatory Visit: Payer: Medicare Other

## 2022-12-12 ENCOUNTER — Ambulatory Visit
Admission: RE | Admit: 2022-12-12 | Discharge: 2022-12-12 | Disposition: A | Discharge status: Home / Self Care | Payer: Medicare Other | Source: Ambulatory Visit | Attending: Radiation Oncology | Admitting: Radiation Oncology

## 2022-12-12 DIAGNOSIS — C7951 Secondary malignant neoplasm of bone: Secondary | ICD-10-CM | POA: Insufficient documentation

## 2022-12-12 DIAGNOSIS — C801 Malignant (primary) neoplasm, unspecified: Secondary | ICD-10-CM | POA: Insufficient documentation

## 2022-12-12 DIAGNOSIS — C9000 Multiple myeloma not having achieved remission: Secondary | ICD-10-CM | POA: Insufficient documentation

## 2022-12-12 DIAGNOSIS — Z5112 Encounter for antineoplastic immunotherapy: Secondary | ICD-10-CM | POA: Diagnosis not present

## 2022-12-12 DIAGNOSIS — C3491 Malignant neoplasm of unspecified part of right bronchus or lung: Secondary | ICD-10-CM | POA: Insufficient documentation

## 2022-12-12 DIAGNOSIS — C419 Malignant neoplasm of bone and articular cartilage, unspecified: Secondary | ICD-10-CM | POA: Insufficient documentation

## 2022-12-12 DIAGNOSIS — C799 Secondary malignant neoplasm of unspecified site: Secondary | ICD-10-CM | POA: Insufficient documentation

## 2022-12-12 DIAGNOSIS — C61 Malignant neoplasm of prostate: Secondary | ICD-10-CM | POA: Insufficient documentation

## 2022-12-12 LAB — RAD ONC ARIA SESSION SUMMARY

## 2022-12-13 ENCOUNTER — Inpatient Hospital Stay: Payer: Medicare Other

## 2022-12-13 ENCOUNTER — Other Ambulatory Visit: Payer: Self-pay | Admitting: Medical Oncology

## 2022-12-13 ENCOUNTER — Other Ambulatory Visit: Payer: Self-pay

## 2022-12-13 ENCOUNTER — Encounter: Payer: Self-pay | Admitting: Internal Medicine

## 2022-12-13 ENCOUNTER — Ambulatory Visit
Admission: RE | Admit: 2022-12-13 | Discharge: 2022-12-13 | Disposition: A | Discharge status: Home / Self Care | Payer: Medicare Other | Source: Ambulatory Visit | Attending: Radiation Oncology | Admitting: Radiation Oncology

## 2022-12-13 ENCOUNTER — Ambulatory Visit: Payer: Medicare Other | Admitting: Internal Medicine

## 2022-12-13 ENCOUNTER — Encounter: Payer: Self-pay | Admitting: Medical Oncology

## 2022-12-13 ENCOUNTER — Inpatient Hospital Stay (HOSPITAL_BASED_OUTPATIENT_CLINIC_OR_DEPARTMENT_OTHER): Payer: Medicare Other | Admitting: Internal Medicine

## 2022-12-13 VITALS — BP 91/72 | HR 80 | Resp 16

## 2022-12-13 DIAGNOSIS — C9000 Multiple myeloma not having achieved remission: Secondary | ICD-10-CM

## 2022-12-13 DIAGNOSIS — Z5112 Encounter for antineoplastic immunotherapy: Secondary | ICD-10-CM | POA: Diagnosis not present

## 2022-12-13 LAB — CBC WITH DIFFERENTIAL (CANCER CENTER ONLY)
Abs Immature Granulocytes: 0.16 10*3/uL — ABNORMAL HIGH (ref 0.00–0.07)
Basophils Absolute: 0 10*3/uL (ref 0.0–0.1)
Basophils Relative: 0 %
Eosinophils Absolute: 0.3 10*3/uL (ref 0.0–0.5)
Eosinophils Relative: 5 %
HCT: 43.9 % (ref 39.0–52.0)
Hemoglobin: 15.3 g/dL (ref 13.0–17.0)
Immature Granulocytes: 2 %
Lymphocytes Relative: 8 %
Lymphs Abs: 0.6 10*3/uL — ABNORMAL LOW (ref 0.7–4.0)
MCH: 34.1 pg — ABNORMAL HIGH (ref 26.0–34.0)
MCHC: 34.9 g/dL (ref 30.0–36.0)
MCV: 97.8 fL (ref 80.0–100.0)
Monocytes Absolute: 0.4 10*3/uL (ref 0.1–1.0)
Monocytes Relative: 5 %
Neutro Abs: 6 10*3/uL (ref 1.7–7.7)
Neutrophils Relative %: 80 %
Platelet Count: 83 10*3/uL — ABNORMAL LOW (ref 150–400)
RBC: 4.49 MIL/uL (ref 4.22–5.81)
RDW: 13.1 % (ref 11.5–15.5)
WBC Count: 7.5 10*3/uL (ref 4.0–10.5)
nRBC: 0 % (ref 0.0–0.2)

## 2022-12-13 LAB — RAD ONC ARIA SESSION SUMMARY
Course Elapsed Days: 1
Plan Fractions Treated to Date: 1
Plan Fractions Treated to Date: 2
Plan Prescribed Dose Per Fraction: 3 Gy
Plan Prescribed Dose Per Fraction: 3 Gy
Plan Total Fractions Prescribed: 10
Plan Total Fractions Prescribed: 9
Plan Total Prescribed Dose: 27 Gy
Plan Total Prescribed Dose: 30 Gy
Reference Point Dosage Given to Date: 6 Gy
Reference Point Dosage Given to Date: 6 Gy
Reference Point Session Dosage Given: 3 Gy
Reference Point Session Dosage Given: 3 Gy
Session Number: 2

## 2022-12-13 LAB — CMP (CANCER CENTER ONLY)
ALT: 36 U/L (ref 0–44)
AST: 24 U/L (ref 15–41)
Albumin: 3.3 g/dL — ABNORMAL LOW (ref 3.5–5.0)
Alkaline Phosphatase: 73 U/L (ref 38–126)
Anion gap: 7 (ref 5–15)
BUN: 18 mg/dL (ref 8–23)
CO2: 27 mmol/L (ref 22–32)
Calcium: 9.1 mg/dL (ref 8.9–10.3)
Chloride: 102 mmol/L (ref 98–111)
Creatinine: 1.06 mg/dL (ref 0.61–1.24)
GFR, Estimated: >60 mL/min (ref >60)
Glucose, Bld: 103 mg/dL — ABNORMAL HIGH (ref 70–99)
Potassium: 3.8 mmol/L (ref 3.5–5.1)
Sodium: 136 mmol/L (ref 135–145)
Total Bilirubin: 1.1 mg/dL (ref 0.3–1.2)
Total Protein: 6.6 g/dL (ref 6.5–8.1)

## 2022-12-13 MED ORDER — MONTELUKAST SODIUM 10 MG PO TABS
10.0000 mg | ORAL_TABLET | Freq: Once | ORAL | Status: AC
  Administered 2022-12-13: 10 mg via ORAL
  Filled 2022-12-13: qty 1

## 2022-12-13 MED ORDER — BORTEZOMIB CHEMO SQ INJECTION 3.5 MG (2.5MG/ML)
1.3000 mg/m2 | Freq: Once | INTRAMUSCULAR | Status: AC
  Administered 2022-12-13: 2.75 mg via SUBCUTANEOUS
  Filled 2022-12-13: qty 1.1

## 2022-12-13 MED ORDER — DIPHENHYDRAMINE HCL 25 MG PO CAPS
50.0000 mg | ORAL_CAPSULE | Freq: Once | ORAL | Status: AC
  Administered 2022-12-13: 50 mg via ORAL
  Filled 2022-12-13: qty 2

## 2022-12-13 MED ORDER — ACETAMINOPHEN 325 MG PO TABS
650.0000 mg | ORAL_TABLET | Freq: Once | ORAL | Status: AC
  Administered 2022-12-13: 650 mg via ORAL
  Filled 2022-12-13: qty 2

## 2022-12-13 MED ORDER — DARATUMUMAB-HYALURONIDASE-FIHJ 1800-30000 MG-UT/15ML ~~LOC~~ SOLN
1800.0000 mg | Freq: Once | SUBCUTANEOUS | Status: AC
  Administered 2022-12-13: 1800 mg via SUBCUTANEOUS
  Filled 2022-12-13: qty 15

## 2022-12-13 NOTE — Patient Instructions (Signed)
[place] [facility]  Discharge Instructions: Thank you for choosing [facility] to provide your oncology and hematology care.   If you have a lab appointment with the Cancer Center, please go directly to the Cancer Center and check in at the registration area.   Wear comfortable clothing and clothing appropriate for easy access to any Portacath or PICC line.   We strive to give you quality time with your provider. You may need to reschedule your appointment if you arrive late (15 or more minutes).  Arriving late affects you and other patients whose appointments are after yours.  Also, if you miss three or more appointments without notifying the office, you may be dismissed from the clinic at the provider's discretion.      For prescription refill requests, have your pharmacy contact our office and allow 72 hours for refills to be completed.    Today you received the following chemotherapy and/or immunotherapy agents: Daratumumab Faspro, Velcade.      To help prevent nausea and vomiting after your treatment, we encourage you to take your nausea medication as directed.  BELOW ARE SYMPTOMS THAT SHOULD BE REPORTED IMMEDIATELY: *FEVER GREATER THAN 100.4 F (38 C) OR HIGHER *CHILLS OR SWEATING *NAUSEA AND VOMITING THAT IS NOT CONTROLLED WITH YOUR NAUSEA MEDICATION *UNUSUAL SHORTNESS OF BREATH *UNUSUAL BRUISING OR BLEEDING *URINARY PROBLEMS (pain or burning when urinating, or frequent urination) *BOWEL PROBLEMS (unusual diarrhea, constipation, pain near the anus) TENDERNESS IN MOUTH AND THROAT WITH OR WITHOUT PRESENCE OF ULCERS (sore throat, sores in mouth, or a toothache) UNUSUAL RASH, SWELLING OR PAIN  UNUSUAL VAGINAL DISCHARGE OR ITCHING   Items with * indicate a potential emergency and should be followed up as soon as possible or go to the Emergency Department if any problems should occur.  Please show the CHEMOTHERAPY ALERT CARD or IMMUNOTHERAPY  ALERT CARD at check-in to the Emergency Department and triage nurse.  Should you have questions after your visit or need to cancel or reschedule your appointment, please contact [place] [facility]  Dept: [phone]  and follow the prompts.  Office hours are 8:00 a.m. to 4:30 p.m. Monday - Friday. Please note that voicemails left after 4:00 p.m. may not be returned until the following business day.  We are closed weekends and major holidays. You have access to a nurse at all times for urgent questions. Please call the main number to the clinic Dept: [phone] and follow the prompts.   For any non-urgent questions, you may also contact your provider using MyChart. We now offer e-Visits for anyone 36 and older to request care online for non-urgent symptoms. For details visit [email or web address].   Also download the MyChart app! Go to the app store, search "MyChart", open the app, select [place], and log in with your MyChart username and password.

## 2022-12-13 NOTE — Progress Notes (Signed)
Pt states he took 20mg  Decadron at home prior to arriving for injection.

## 2022-12-13 NOTE — Progress Notes (Signed)
Referral sent to dietician

## 2022-12-13 NOTE — Progress Notes (Signed)
[facility] [phone]   Fax:([phone]) [phone]  OFFICE PROGRESS NOTE  [name], MD [address] 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 daily for 14 days every 3 weeks and Decadron 20 mg p.o. weekly with chemotherapy.  First cycle expected on 12/05/2022.  He is here today for day 8 of cycle #1.  INTERVAL HISTORY: Ronald Duran 82 y.o. male returns to the clinic today for follow-up visit accompanied by his wife.  The patient tolerated the first week of his treatment fairly well with no concerning adverse effect except for mild itching at the Velcade subcutaneous injection site.  He denied having any significant fever or chills.  He has no nausea, vomiting, diarrhea or constipation.  He has no headache or visual changes.  He denied having any recent weight loss or night sweats.  He is here today for evaluation before starting day 8 of cycle #1.   MEDICAL HISTORY: Past Medical History:  Diagnosis Date   Atrial fibrillation (HCC)    CAD (coronary artery disease)    a. s/p MI with LAD stent placement December, 1999 -last stress test November 2008 with no EKG evidence of ischemia  b. cath 07/23/2015 after abnormal ETT and nuc, occluded mid LAD stent with good distal collateral, medical therapy    Cardiomyopathy (HCC)    Ischemic cardiomyopathy with EF 40% by echo 2005   COVID 2021   mild case   Dyslipidemia    Erectile dysfunction    GERD (gastroesophageal reflux disease)    Hiatal hernia    with esophageal strictures and dilatations in the past -  Dr. [name]   History of nuclear stress test    Myoview 12/16: EF 47%, anterior, anteroseptal and inferoseptal defect suggesting infarct with peri-infarct ischemia, intermediate risk   HTN (hypertension)    Hx of seasonal allergies    Hyperlipidemia    Myocardial infarction (HCC) 08/01/1998   PAD (peripheral artery disease) (HCC)    Pre-diabetes    Prostate cancer (HCC)    with seed implant therapy following Lupron therapy in 2008 and 2009 - followed by Dr. Isabel Caprice   Ringworm    Stroke (HCC) 04/20/2017   seen on a MRI   Vitamin D deficiency     ALLERGIES:  has No Known Allergies.  MEDICATIONS:  Current Outpatient Medications  Medication Sig Dispense Refill   acyclovir (ZOVIRAX) 200 MG capsule Take 1 capsule (200 mg total) by mouth 2 (two) times daily. 60 capsule 5   amLODipine (NORVASC) 5 MG tablet Take 1 tablet (5 mg total) by mouth daily. 90 tablet 2   atorvastatin (LIPITOR) 40 MG tablet Take 40 mg by mouth at bedtime.     B Complex Vitamins (B COMPLEX 100) tablet Take 1 tablet by mouth daily. Balance     benazepril-hydrochlorthiazide (LOTENSIN HCT) 20-12.5 MG tablet Take 1 tablet by mouth daily. 90 tablet 2   cetirizine (ZYRTEC) 10 MG tablet Take 10 mg by mouth daily.     Cholecalciferol (VITAMIN D3) 2000 UNITS TABS Take 2,000 Units by  mouth daily.     Coenzyme Q10 (CO Q-10) 100 MG CAPS Take 100 mg by mouth daily with supper.     dexamethasone (DECADRON) 4 MG tablet 5 tab weekly start with the first dose of chemotherapy. 40 tablet 3   lenalidomide (REVLIMID) 25 MG capsule Take 1 capsule (25 mg total) by mouth daily. Take for 14 days, then none for 7 days. Repeat every 21 days. Start Revlimid on day 1 of chemotherapy 14 capsule 0   metoprolol succinate (TOPROL XL) 25 MG 24 hr tablet Take 1 tablet (25 mg total) by mouth daily. 90 tablet 3   Multiple Vitamins-Minerals (CENTRUM) tablet Take 1 tablet by mouth daily.     nitroGLYCERIN (NITROSTAT) 0.4 MG SL tablet PLACE 1 TABLET  UNDER THE TONGUE IF NEEDED EVERY 5 MINUTES FOR CHEST PAIN FOR 3 DOSES IF NO RELIEF AFTER FIRST DOSE CALL 911. 25 tablet 9   omeprazole (PRILOSEC) 20 MG capsule Take 20 mg by mouth daily before breakfast.     oxyCODONE (OXY IR/ROXICODONE) 5 MG immediate release tablet Take 1-2 tablets (5-10 mg total) by mouth every 6 (six) hours as needed for moderate pain (pain score 4-6). 40 tablet 0   prochlorperazine (COMPAZINE) 10 MG tablet Take 1 tablet (10 mg total) by mouth every 6 (six) hours as needed for nausea or vomiting. 30 tablet 1   warfarin (COUMADIN) 5 MG tablet TAKE 1/2 TO 1 TABLET BY MOUTH DAILY AS DIRECTED BY COUMADIN CLINIC (Patient taking differently: Take 2.5-5 mg by mouth See admin instructions. Take 2.5 mg by mouth on Monday, Wednesday and Friday and take 5 mg on Tuesday, Thursday, Saturday and Sunday) 100 tablet 0   No current facility-administered medications for this visit.    SURGICAL HISTORY:  Past Surgical History:  Procedure Laterality Date   BRONCHIAL BIOPSY  11/15/2022   Procedure: BRONCHIAL BIOPSIES;  Surgeon: [name], DO;  Location: [facility] END[facility];  Service: Pulmonary;;   BRONCHIAL BRUSHINGS  11/15/2022   Procedure: BRONCHIAL BRUSHINGS;  Surgeon: [name], DO;  Location: [facility];  Service: Pulmonary;;   BRONCHIAL NEEDLE ASPIRATION BIOPSY  11/15/2022   Procedure: BRONCHIAL NEEDLE ASPIRATION BIOPSIES;  Surgeon: [name], DO;  Location: [facility] ENDOSCOPY;  Service: Pulmonary;;   CARDIAC CATHETERIZATION N/A 07/23/2015   Procedure: Left Heart Cath and Coronary Angiography;  Surgeon: [name], MD;  Location: [facility] INVASIVE CV LAB;  Service: Cardiovascular;  Laterality: N/A;   CARDIOVERSION N/A 06/03/2022   Procedure: CARDIOVERSION;  Surgeon: [name], MD;  Location: [facility] ENDOSCOPY;  Service: Cardiovascular;  Laterality: N/A;   COLONOSCOPY WITH PROPOFOL N/A 11/24/2015   Procedure: COLONOSCOPY WITH PROPOFOL;  Surgeon: [name], MD;  Location: [facility]  ENDOSCOPY;  Service: Endoscopy;  Laterality: N/A;   esophageal strictures with dilatations in the past per Dr. [name]     last '98   FIDUCIAL MARKER PLACEMENT  11/15/2022   Procedure: FIDUCIAL MARKER PLACEMENT;  Surgeon: [name], DO;  Location: [facility] ENDOSCOPY;  Service: Pulmonary;;   HERNIA REPAIR Left    INTRAMEDULLARY (IM) NAIL INTERTROCHANTERIC Left 10/25/2022   Procedure: LEFT INTRAMEDULLARY (IM) NAIL INTERTROCHANTERIC;  Surgeon: [name], MD;  Location: [facility] OR;  Service: Orthopedics;  Laterality: Left;   IR BONE MARROW BIOPSY & ASPIRATION  11/17/2022   IR BONE TUMOR(S)RF ABLATION  10/11/2022   IR KYPHO THORACIC WITH BONE BIOPSY  10/11/2022   LAD stent placement - Dr. [name]     left inguinal  hernia repair - Dr. [name]     prostatic radioactive seed implantation - Dr. [name]     '09    REVIEW OF SYSTEMS:  A comprehensive review of systems was negative except for: Constitutional: positive for fatigue Integument/breast: positive for pruritus   PHYSICAL EXAMINATION: General appearance: alert, cooperative, fatigued, and no distress Head: Normocephalic, without obvious abnormality, atraumatic Neck: no adenopathy, no JVD, supple, symmetrical, trachea midline, and thyroid not enlarged, symmetric, no tenderness/mass/nodules Lymph nodes: Cervical, supraclavicular, and axillary nodes normal. Resp: clear to auscultation bilaterally Back: symmetric, no curvature. ROM normal. No CVA tenderness. Cardio: regular rate and rhythm, S1, S2 normal, no murmur, click, rub or gallop GI: soft, non-tender; bowel sounds normal; no masses,  no organomegaly Extremities: extremities normal, atraumatic, no cyanosis or edema  ECOG PERFORMANCE STATUS: 1 - Symptomatic but completely ambulatory  Blood pressure 100/62, pulse 96, temperature (!) 97.3 F (36.3 C), temperature source Oral, resp. rate 17, weight 191 lb 12.8 oz (87 kg), SpO2 99 %.  LABORATORY DATA: Lab Results  Component Value  Date   WBC 7.5 12/13/2022   HGB 15.3 12/13/2022   HCT 43.9 12/13/2022   MCV 97.8 12/13/2022   PLT 83 (L) 12/13/2022      Chemistry      Component Value Date/Time   NA 135 12/06/2022 1145   NA 141 08/25/2022 1440   K 3.7 12/06/2022 1145   CL 102 12/06/2022 1145   CO2 27 12/06/2022 1145   BUN 16 12/06/2022 1145   BUN 14 08/25/2022 1440   CREATININE 1.02 12/06/2022 1145   CREATININE 1.23 (H) 07/21/2015 0917      Component Value Date/Time   CALCIUM 9.8 12/06/2022 1145   ALKPHOS 89 12/06/2022 1145   AST 22 12/06/2022 1145   ALT 27 12/06/2022 1145   BILITOT 1.1 12/06/2022 1145       RADIOGRAPHIC STUDIES: XR FEMUR MIN 2 VIEWS LEFT  Result Date: 12/06/2022 Strength stable alignment of the hardware without complication  IR BONE MARROW BIOPSY & ASPIRATION  Result Date: 11/17/2022 INDICATION: Concern for multiple myeloma. Please perform image guided bone marrow biopsy for tissue diagnostic purposes. EXAM: FLUOROSCOPIC GUIDED BONE MARROW BIOPSY AND ASPIRATION MEDICATIONS: None ANESTHESIA/SEDATION: Moderate (conscious) sedation was employed during this procedure as administered by the Interventional Radiology RN. A total of Versed 2 mg and Fentanyl 100 mcg was administered intravenously. Moderate Sedation Time: 10 minutes. The patient's level of consciousness and vital signs were monitored continuously by radiology nursing throughout the procedure under my direct supervision. FLUOROSCOPY TIME: 6 mGy COMPLICATIONS: None immediate. PROCEDURE: Informed consent was obtained from the patient following an explanation of the procedure, risks, benefits and alternatives. The patient understands, agrees and consents for the procedure. All questions were addressed. A time out was performed prior to the initiation of the procedure. The patient was positioned prone on the fluoroscopy table and the posterior aspect of the right iliac crest was marked fluoroscopically. The operative site was prepped and  draped in the usual sterile fashion. Under sterile conditions and local anesthesia, an 11 gauge coaxial bone biopsy needle was advanced into the posterior aspect of the right iliac marrow space under intermittent fluoroscopic guidance. Multiple fluoroscopic images were saved procedural documentation purposes. Initially, a bone marrow aspiration was performed. Next, a bone marrow biopsy was obtained with the 11 gauge outer bone marrow device. Samples were prepared with the cytotechnologist and deemed adequate. The needle was removed and superficial hemostasis was obtained with manual compression. A dressing  was applied. The patient tolerated the procedure well without immediate post procedural complication. IMPRESSION: Successful fluoroscopic guided right iliac bone marrow aspiration and core biopsy. Electronically Signed   By: Simonne Come M.D.   On: 11/17/2022 09:25   DG Chest Port 1 View  Result Date: 11/15/2022 CLINICAL DATA:  Status post bronchoscopy with biopsy EXAM: PORTABLE CHEST 1 VIEW COMPARISON:  CT Chest 11/11/22 FINDINGS: No pleural effusion. No pneumothorax. Normal cardiac and mediastinal contours. No focal airspace opacity. No radiographically apparent displaced rib fractures. Visualized upper abdomen is unremarkable. IMPRESSION: No focal airspace opacity.  No pneumothorax. Electronically Signed   By: Lorenza Cambridge M.D.   On: 11/15/2022 08:48   DG C-ARM BRONCHOSCOPY  Result Date: 11/15/2022 C-ARM BRONCHOSCOPY: Fluoroscopy was utilized by the requesting physician.  No radiographic interpretation.    ASSESSMENT AND PLAN: This is a very pleasant 82 years old white male with multiple myeloma presented with multiple lytic lesions involving T10, left femoral neck as well as T4, T9 and T11 vertebral bodies. Also had right lower lobe lung nodule that was suspicious for non-small cell lung cancer. The biopsy performed by interventional radiology from the T10 lesion was consistent with plasmacytoma which  is part of the whole picture of multiple myeloma. The biopsy from the left hip was negative for malignancy. His myeloma panel showed no concerning findings except for elevated IgG but the serum light chains were normal. The patient underwent left hip fixation for impending fracture. He also underwent palliative radiotherapy under the care of Dr. Kathrynn Running. His recent bone marrow biopsy and aspirate was consistent with plasma cell neoplasm with involvement of plasma cells 25% of the bone marrow. The patient is currently on treatment with daratumumab, Velcade, Revlimid and dexamethasone started on 12/05/2022 status post 1 week of treatment and he has been tolerating this treatment fairly well so far. Repeat CBC today showed low platelets count of 89,000 but the patient will be good for proceeding with day 8 of cycle #1 as planned. I will see him back for follow-up visit in 2 weeks for evaluation. For the suspicious right lower lobe lung cancer, I the patient is expected to undergo SBRT under the care of Dr. Kathrynn Running. For anticoagulation, he is currently on Coumadin. He was advised to call immediately if he has any other concerning symptoms in the interval. The patient voices understanding of current disease status and treatment options and is in agreement with the current care plan.  All questions were answered. The patient knows to call the clinic with any problems, questions or concerns. We can certainly see the patient much sooner if necessary.  The total time spent in the appointment was 20 minutes.  Disclaimer: This note was dictated with voice recognition software. Similar sounding words can inadvertently be transcribed and may not be corrected upon review.

## 2022-12-13 NOTE — Progress Notes (Signed)
Patient tolerated second dose of SQ daratumumab well. No issues noted. Patient discharged in stable condition. Ambulatory to lobby with spouse.

## 2022-12-13 NOTE — Progress Notes (Signed)
Patient seen by Dr. Gypsy Balsam are within treatment parameters.  Labs reviewed: and platelets @ 83K are not within treatment parameters.   Per Dr. Arbutus Ped , it is okay to treat pt today with Bortezomab and Daratumumab hyaluronidase -fihg and platelets of 83K.   Per physician team, patient is ready for treatment and there are NO modifications to the treatment plan.

## 2022-12-14 ENCOUNTER — Ambulatory Visit: Admission: RE | Admit: 2022-12-14 | Payer: Medicare Other | Source: Ambulatory Visit | Admitting: Radiation Oncology

## 2022-12-14 ENCOUNTER — Ambulatory Visit
Admission: RE | Admit: 2022-12-14 | Discharge: 2022-12-14 | Disposition: A | Discharge status: Home / Self Care | Payer: Medicare Other | Source: Ambulatory Visit | Attending: Radiation Oncology | Admitting: Radiation Oncology

## 2022-12-14 ENCOUNTER — Other Ambulatory Visit: Payer: Self-pay

## 2022-12-14 DIAGNOSIS — Z5112 Encounter for antineoplastic immunotherapy: Secondary | ICD-10-CM | POA: Diagnosis not present

## 2022-12-14 LAB — RAD ONC ARIA SESSION SUMMARY
Course Elapsed Days: 2
Plan Fractions Treated to Date: 2
Plan Fractions Treated to Date: 3
Plan Prescribed Dose Per Fraction: 3 Gy
Plan Prescribed Dose Per Fraction: 3 Gy
Plan Total Fractions Prescribed: 10
Plan Total Fractions Prescribed: 9
Plan Total Prescribed Dose: 27 Gy
Plan Total Prescribed Dose: 30 Gy
Reference Point Dosage Given to Date: 9 Gy
Reference Point Dosage Given to Date: 9 Gy
Reference Point Session Dosage Given: 3 Gy
Reference Point Session Dosage Given: 3 Gy
Session Number: 3

## 2022-12-15 ENCOUNTER — Ambulatory Visit
Admission: RE | Admit: 2022-12-15 | Discharge: 2022-12-15 | Disposition: A | Discharge status: Home / Self Care | Payer: Medicare Other | Source: Ambulatory Visit | Attending: Radiation Oncology | Admitting: Radiation Oncology

## 2022-12-15 ENCOUNTER — Other Ambulatory Visit: Payer: Self-pay

## 2022-12-15 ENCOUNTER — Ambulatory Visit: Payer: Medicare Other

## 2022-12-15 DIAGNOSIS — C3491 Malignant neoplasm of unspecified part of right bronchus or lung: Secondary | ICD-10-CM

## 2022-12-15 DIAGNOSIS — Z5112 Encounter for antineoplastic immunotherapy: Secondary | ICD-10-CM | POA: Diagnosis not present

## 2022-12-15 LAB — RAD ONC ARIA SESSION SUMMARY
Course Elapsed Days: 3
Plan Fractions Treated to Date: 3
Plan Fractions Treated to Date: 4
Plan Prescribed Dose Per Fraction: 3 Gy
Plan Prescribed Dose Per Fraction: 3 Gy
Plan Total Fractions Prescribed: 10
Plan Total Fractions Prescribed: 9
Plan Total Prescribed Dose: 27 Gy
Plan Total Prescribed Dose: 30 Gy
Reference Point Dosage Given to Date: 12 Gy
Reference Point Dosage Given to Date: 12 Gy
Reference Point Session Dosage Given: 3 Gy
Reference Point Session Dosage Given: 3 Gy
Session Number: 4

## 2022-12-15 MED ORDER — SONAFINE EX EMUL
1.0000 | Freq: Two times a day (BID) | CUTANEOUS | Status: DC
  Administered 2022-12-15: 1 via TOPICAL

## 2022-12-15 NOTE — Progress Notes (Signed)
Pt here for patient teaching. Pt given Radiation and You booklet, skin care instructions, and Sonafine. Reviewed areas of pertinence such as diarrhea, fatigue, hair loss, mouth changes, nausea and vomiting, skin changes, throat changes, urinary and bladder changes, cough, shortness of breath, earaches, and taste changes. Pt able to give teach back of to pat skin, use unscented/gentle soap, and drink plenty of water, apply Sonafine bid, avoid applying anything to skin within 4 hours of treatment, and to use an electric razor if they must shave. Pt verbalizes understanding of information given and will contact nursing with any questions or concerns.    Http://rtanswers.org/treatmentinformation/whattoexpect/index  This concludes the conversation.   Ruel Favors, LPN

## 2022-12-16 ENCOUNTER — Ambulatory Visit
Admission: RE | Admit: 2022-12-16 | Discharge: 2022-12-16 | Disposition: A | Discharge status: Home / Self Care | Payer: Medicare Other | Source: Ambulatory Visit | Attending: Radiation Oncology | Admitting: Radiation Oncology

## 2022-12-16 ENCOUNTER — Inpatient Hospital Stay: Payer: Medicare Other

## 2022-12-16 ENCOUNTER — Other Ambulatory Visit: Payer: Self-pay

## 2022-12-16 ENCOUNTER — Telehealth: Payer: Self-pay | Admitting: Internal Medicine

## 2022-12-16 VITALS — BP 110/86 | HR 84 | Temp 97.8°F | Resp 16 | Wt 193.2 lb

## 2022-12-16 DIAGNOSIS — C9000 Multiple myeloma not having achieved remission: Secondary | ICD-10-CM

## 2022-12-16 DIAGNOSIS — C3491 Malignant neoplasm of unspecified part of right bronchus or lung: Secondary | ICD-10-CM

## 2022-12-16 DIAGNOSIS — Z5112 Encounter for antineoplastic immunotherapy: Secondary | ICD-10-CM | POA: Diagnosis not present

## 2022-12-16 LAB — RAD ONC ARIA SESSION SUMMARY
Course Elapsed Days: 4
Course Elapsed Days: 4
Plan Fractions Treated to Date: 1
Plan Fractions Treated to Date: 4
Plan Fractions Treated to Date: 5
Plan Prescribed Dose Per Fraction: 18 Gy
Plan Prescribed Dose Per Fraction: 3 Gy
Plan Prescribed Dose Per Fraction: 3 Gy
Plan Total Fractions Prescribed: 3
Plan Total Fractions Prescribed: 10
Plan Total Fractions Prescribed: 9
Plan Total Prescribed Dose: 54 Gy
Plan Total Prescribed Dose: 27 Gy
Plan Total Prescribed Dose: 30 Gy
Reference Point Dosage Given to Date: 18 Gy
Reference Point Dosage Given to Date: 15 Gy
Reference Point Dosage Given to Date: 15 Gy
Reference Point Session Dosage Given: 18 Gy
Reference Point Session Dosage Given: 3 Gy
Reference Point Session Dosage Given: 3 Gy
Session Number: 5
Session Number: 6

## 2022-12-16 MED ORDER — BORTEZOMIB CHEMO SQ INJECTION 3.5 MG (2.5MG/ML)
1.3000 mg/m2 | Freq: Once | INTRAMUSCULAR | Status: AC
  Administered 2022-12-16: 2.75 mg via SUBCUTANEOUS
  Filled 2022-12-16: qty 1.1

## 2022-12-16 MED ORDER — PROCHLORPERAZINE MALEATE 10 MG PO TABS
10.0000 mg | ORAL_TABLET | Freq: Once | ORAL | Status: AC
  Administered 2022-12-16: 10 mg via ORAL
  Filled 2022-12-16: qty 1

## 2022-12-16 NOTE — Telephone Encounter (Signed)
Called patient regarding May/June and July appointments, left a voicemail.

## 2022-12-19 ENCOUNTER — Ambulatory Visit: Payer: Medicare Other

## 2022-12-19 ENCOUNTER — Ambulatory Visit
Admission: RE | Admit: 2022-12-19 | Discharge: 2022-12-19 | Disposition: A | Discharge status: Home / Self Care | Payer: Medicare Other | Source: Ambulatory Visit | Attending: Radiation Oncology | Admitting: Radiation Oncology

## 2022-12-19 ENCOUNTER — Telehealth: Payer: Self-pay | Admitting: Medical Oncology

## 2022-12-19 ENCOUNTER — Inpatient Hospital Stay (HOSPITAL_BASED_OUTPATIENT_CLINIC_OR_DEPARTMENT_OTHER): Payer: Medicare Other | Admitting: Physician Assistant

## 2022-12-19 ENCOUNTER — Ambulatory Visit: Payer: Medicare Other | Attending: Internal Medicine | Admitting: *Deleted

## 2022-12-19 ENCOUNTER — Inpatient Hospital Stay: Payer: Medicare Other

## 2022-12-19 ENCOUNTER — Other Ambulatory Visit: Payer: Self-pay | Admitting: Medical Oncology

## 2022-12-19 ENCOUNTER — Other Ambulatory Visit: Payer: Self-pay

## 2022-12-19 ENCOUNTER — Other Ambulatory Visit: Payer: Medicare Other

## 2022-12-19 ENCOUNTER — Ambulatory Visit: Payer: Medicare Other | Admitting: Physician Assistant

## 2022-12-19 DIAGNOSIS — Z79899 Other long term (current) drug therapy: Secondary | ICD-10-CM | POA: Diagnosis not present

## 2022-12-19 DIAGNOSIS — I4819 Other persistent atrial fibrillation: Secondary | ICD-10-CM

## 2022-12-19 DIAGNOSIS — Z7901 Long term (current) use of anticoagulants: Secondary | ICD-10-CM | POA: Diagnosis not present

## 2022-12-19 DIAGNOSIS — C9000 Multiple myeloma not having achieved remission: Secondary | ICD-10-CM

## 2022-12-19 DIAGNOSIS — I6349 Cerebral infarction due to embolism of other cerebral artery: Secondary | ICD-10-CM | POA: Diagnosis not present

## 2022-12-19 DIAGNOSIS — R21 Rash and other nonspecific skin eruption: Secondary | ICD-10-CM

## 2022-12-19 DIAGNOSIS — C3491 Malignant neoplasm of unspecified part of right bronchus or lung: Secondary | ICD-10-CM

## 2022-12-19 DIAGNOSIS — Z5112 Encounter for antineoplastic immunotherapy: Secondary | ICD-10-CM | POA: Diagnosis not present

## 2022-12-19 LAB — RAD ONC ARIA SESSION SUMMARY
Course Elapsed Days: 7
Plan Fractions Treated to Date: 2
Plan Prescribed Dose Per Fraction: 18 Gy
Plan Total Fractions Prescribed: 3
Plan Total Prescribed Dose: 54 Gy
Reference Point Dosage Given to Date: 36 Gy
Reference Point Session Dosage Given: 18 Gy
Session Number: 8

## 2022-12-19 LAB — POCT INR: INR: 3.8 — AB (ref 2.0–3.0)

## 2022-12-19 MED ORDER — LENALIDOMIDE 25 MG PO CAPS
25.0000 mg | ORAL_CAPSULE | Freq: Every day | ORAL | 0 refills | Status: DC
Start: 2022-12-19 — End: 2022-12-27

## 2022-12-19 MED ORDER — TRIAMCINOLONE ACETONIDE 0.5 % EX OINT: 1.0000 | TOPICAL_OINTMENT | Freq: Two times a day (BID) | CUTANEOUS | 0 refills | Status: DC

## 2022-12-19 MED ORDER — LENALIDOMIDE 25 MG PO CAPS
25.0000 mg | ORAL_CAPSULE | Freq: Every day | ORAL | 0 refills | Status: DC
Start: 2022-12-19 — End: 2022-12-19

## 2022-12-19 NOTE — Patient Instructions (Signed)
Description   Started Decadron 20mg  weekly. Do not take any warfarin today then START taking warfarin 1/2 tablet daily except for 1 tablet on Sunday, Tuesday, and Thursday.  Continue eating broccoli only every 2-3 days per week.  Recheck INR 2 weeks  Anticoagulation Clinic-[phone].

## 2022-12-19 NOTE — Addendum Note (Signed)
Addended by: Charma Igo on: 12/19/2022 02:54 PM   Modules accepted: Orders

## 2022-12-19 NOTE — Telephone Encounter (Addendum)
Velcade injection site R abd " bruising and localized rash that goes down my R leg and is painful to touch". Denies itching , temp 96.1 F @ home, Radiation tx today at 1330.

## 2022-12-19 NOTE — Telephone Encounter (Signed)
Revlimid refill requested 

## 2022-12-19 NOTE — Progress Notes (Signed)
Symptom Management Consult Note [facility]    Patient Care Team: [name], MD as PCP - General (Internal Medicine) [name], MD (Inactive) as PCP - Cardiology (Cardiology)    Name / MRN / DOB: Ronald Duran  [id number]  [birth year]   Date of visit: 12/19/2022   Chief Complaint/Reason for visit: rash   Current Therapy: velcade and darzalex with concurrent radiation  Last treatment:  Day 11   Cycle 1 on 12/16/22   ASSESSMENT & PLAN: Patient is a 82 y.o. 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 Arbutus Ped.  I have viewed most recent oncology note and lab work.    # Multiple myeloma with plasmacytoma - Next appointment with oncologist is 12/27/22   # Rash -New rash around recent injection site.  Rash is not consistent with petechiae.  Lower suspicion for shingles as he has no associated pain and is on prophylactic acyclovir. Presentation not suggestive of cellulitis. -Discussed plan with oncologist who agrees for trial of steroid ointment.  Prescription sent to the pharmacy for Kenalog. -Will recheck rash tomorrow while patient is here for scheduled treatment.   Heme/Onc History: Oncology History  Multiple myeloma without remission (HCC)  11/23/2022 Initial Diagnosis   Multiple myeloma without remission   12/06/2022 -  Chemotherapy   Patient is on Treatment Plan : MYELOMA NEWLY DIAGNOSED TRANSPLANT CANDIDATE DaraVRd (Daratumumab SQ) q21d x 6 Cycles (Induction/Consolidation)         Interval history-: Ronald Duran is a 82 y.o. male with oncologic history as above presenting to [facility] today with chief complaint of rash x 1 day.  He is accompanied by his spouse who provides additional history.  Patient states he had his Velcade injection on Friday.  That evening he noticed redness around the injection site which  has been typical.  Today he noticed that he had a rash around the injection site.  It spread to his groin.  He denies any associated pain or itching.  He does state if he presses deeply it feels like the area is bruised.  He denies any injury to the area. He denies any new medication or rash anywhere else.    ROS  All other systems are reviewed and are negative for acute change except as noted in the HPI.    No Known Allergies   Past Medical History:  Diagnosis Date   Atrial fibrillation (HCC)    CAD (coronary artery disease)    a. s/p MI with LAD stent placement December, 1999 -last stress test November 2008 with no EKG evidence of ischemia  b. cath 07/23/2015 after abnormal ETT and nuc, occluded mid LAD stent with good distal collateral, medical therapy    Cardiomyopathy (HCC)    Ischemic cardiomyopathy with EF 40% by echo 2005   COVID 2021   mild case   Dyslipidemia    Erectile dysfunction    GERD (gastroesophageal reflux disease)    Hiatal hernia    with esophageal strictures and dilatations in the past - Dr. [name]   History of nuclear stress test    Myoview 12/16: EF 47%, anterior, anteroseptal and inferoseptal defect suggesting infarct with peri-infarct ischemia, intermediate risk   HTN (hypertension)    Hx of seasonal allergies    Hyperlipidemia    Myocardial infarction (HCC) 08/01/1998   PAD (peripheral artery disease) (  HCC)    Pre-diabetes    Prostate cancer (HCC)    with seed implant therapy following Lupron therapy in 2008 and 2009 - followed by Dr. Pollyann Glen    Stroke [facility]) 04/20/2017   seen on a MRI   Vitamin D deficiency      Past Surgical History:  Procedure Laterality Date   BRONCHIAL BIOPSY  11/15/2022   Procedure: BRONCHIAL BIOPSIES;  Surgeon: [name], DO;  Location: [facility] ENDOSCOPY;  Service: Pulmonary;;   BRONCHIAL BRUSHINGS  11/15/2022   Procedure: BRONCHIAL BRUSHINGS;  Surgeon: [name], DO;  Location: [facility] ENDOSCOPY;   Service: Pulmonary;;   BRONCHIAL NEEDLE ASPIRATION BIOPSY  11/15/2022   Procedure: BRONCHIAL NEEDLE ASPIRATION BIOPSIES;  Surgeon: [name], DO;  Location: [facility] ENDOSCOPY;  Service: Pulmonary;;   CARDIAC CATHETERIZATION N/A 07/23/2015   Procedure: Left Heart Cath and Coronary Angiography;  Surgeon: [name], MD;  Location[facility] INVASIVE CV LAB;  Service: Cardiovascular;  Laterality: N/A;   CARDIOVERSION N/A 06/03/2022   Procedure: CARDIOVERSION;  Surgeon: [name], MD;  Location: [facility] ENDOSCOPY;  Service: Cardiovascular;  Laterality: N/A;   COLONOSCOPY WITH PROPOFOL N/A 11/24/2015   Procedure: COLONOSCOPY WITH PROPOFOL;  Surgeon: [name], MD;  Location: [facility] ENDOSCOPY;  Service: Endoscopy;  Laterality: N/A;   esophageal strictures with dilatations in the past per Dr. [name]     last '98   FIDUCIAL MARKER PLACEMENT  11/15/2022   Procedure: FIDUCIAL MARKER PLACEMENT;  Surgeon: [name], DO;  Location: [facility] ENDOSCOPY;  Service: Pulmonary;;   HERNIA REPAIR Left    INTRAMEDULLARY (IM) NAIL INTERTROCHANTERIC Left 10/25/2022   Procedure: LEFT INTRAMEDULLARY (IM) NAIL INTERTROCHANTERIC;  Surgeon: [name], MD;  Location: [facility] OR;  Service: Orthopedics;  Laterality: Left;   IR BONE MARROW BIOPSY & ASPIRATION  11/17/2022   IR BONE TUMOR(S)RF ABLATION  10/11/2022   IR KYPHO THORACIC WITH BONE BIOPSY  10/11/2022   LAD stent placement - Dr. [name]     left inguinal hernia repair - Dr. [name]     prostatic radioactive seed implantation - Dr. [name]     '09    Social History   Socioeconomic History   Marital status: Married    Spouse name: ,Talbert Forest   Number of children: 4   Years of education: Not on file   Highest education level: Not on file  Occupational History   Not on file  Tobacco Use   Smoking status: Never    Passive exposure: Past   Smokeless tobacco: Never  Vaping Use   Vaping Use: Never used  Substance and Sexual Activity   Alcohol use: Not  Currently   Drug use: No   Sexual activity: Not Currently  Other Topics Concern   Not on file  Social History Narrative   Not on file   Social Determinants of Health   Financial Resource Strain: Not on file  Food Insecurity: No Food Insecurity (10/12/2022)   Hunger Vital Sign    Worried About Running Out of Food in the Last Year: Never true    Ran Out of Food in the Last Year: Never true  Transportation Needs: No Transportation Needs (10/12/2022)   PRAPARE - Administrator, Civil Service (Medical): No    Lack of Transportation (Non-Medical): No  Physical Activity: Not on file  Stress: Not on file  Social Connections: Not on file  Intimate Partner Violence: Not At Risk (10/12/2022)  Humiliation, Afraid, Rape, and Kick questionnaire    Fear of Current or Ex-Partner: No    Emotionally Abused: No    Physically Abused: No    Sexually Abused: No    Family History  Problem Relation Age of Onset   CVA Mother    CVA Father 71   CAD Sister      Current Outpatient Medications:    triamcinolone ointment (KENALOG) 0.5 %, Apply 1 Application topically 2 (two) times daily., Disp: 30 g, Rfl: 0   acyclovir (ZOVIRAX) 200 MG capsule, Take 1 capsule (200 mg total) by mouth 2 (two) times daily., Disp: 60 capsule, Rfl: 5   amLODipine (NORVASC) 5 MG tablet, Take 1 tablet (5 mg total) by mouth daily., Disp: 90 tablet, Rfl: 2   atorvastatin (LIPITOR) 40 MG tablet, Take 40 mg by mouth at bedtime., Disp: , Rfl:    B Complex Vitamins (B COMPLEX 100) tablet, Take 1 tablet by mouth daily. Balance, Disp: , Rfl:    benazepril-hydrochlorthiazide (LOTENSIN HCT) 20-12.5 MG tablet, Take 1 tablet by mouth daily., Disp: 90 tablet, Rfl: 2   cetirizine (ZYRTEC) 10 MG tablet, Take 10 mg by mouth daily., Disp: , Rfl:    Cholecalciferol (VITAMIN D3) 2000 UNITS TABS, Take 2,000 Units by mouth daily., Disp: , Rfl:    Coenzyme Q10 (CO Q-10) 100 MG CAPS, Take 100 mg by mouth daily with supper., Disp: , Rfl:     dexamethasone (DECADRON) 4 MG tablet, 5 tab weekly start with the first dose of chemotherapy., Disp: 40 tablet, Rfl: 3   lenalidomide (REVLIMID) 25 MG capsule, Take 1 capsule (25 mg total) by mouth daily. Take for 14 days, then none for 7 days. Repeat every 21 days. Start Revlimid on day 1 of chemotherapy Auth #=[id number] date obtained 12/19/2022 Adult Male., Disp: 14 capsule, Rfl: 0   metoprolol succinate (TOPROL XL) 25 MG 24 hr tablet, Take 1 tablet (25 mg total) by mouth daily., Disp: 90 tablet, Rfl: 3   Multiple Vitamins-Minerals (CENTRUM) tablet, Take 1 tablet by mouth daily., Disp: , Rfl:    nitroGLYCERIN (NITROSTAT) 0.4 MG SL tablet, PLACE 1 TABLET UNDER THE TONGUE IF NEEDED EVERY 5 MINUTES FOR CHEST PAIN FOR 3 DOSES IF NO RELIEF AFTER FIRST DOSE CALL 911. (Patient not taking: Reported on 12/13/2022), Disp: 25 tablet, Rfl: 9   omeprazole (PRILOSEC) 20 MG capsule, Take 20 mg by mouth daily before breakfast., Disp: , Rfl:    prochlorperazine (COMPAZINE) 10 MG tablet, Take 1 tablet (10 mg total) by mouth every 6 (six) hours as needed for nausea or vomiting. (Patient not taking: Reported on 12/13/2022), Disp: 30 tablet, Rfl: 1   warfarin (COUMADIN) 5 MG tablet, TAKE 1/2 TO 1 TABLET BY MOUTH DAILY AS DIRECTED BY COUMADIN CLINIC (Patient taking differently: Take 2.5-5 mg by mouth See admin instructions. Take 2.5 mg by mouth on Monday, Wednesday and Friday and take 5 mg on Tuesday, Thursday, Saturday and Sunday), Disp: 100 tablet, Rfl: 0  PHYSICAL EXAM: ECOG FS:1 - Symptomatic but completely ambulatory   T: 97.9 oral BP: 86/67 HR: 79  Resp: 18 Physical Exam Vitals and nursing note reviewed.  Constitutional:      Appearance: He is not ill-appearing or toxic-appearing.  HENT:     Head: Normocephalic.  Eyes:     Conjunctiva/sclera: Conjunctivae normal.  Cardiovascular:     Rate and Rhythm: Normal rate.  Pulmonary:     Effort: Pulmonary effort is normal.  Abdominal:  General: There is no  distension.  Musculoskeletal:     Cervical back: Normal range of motion.  Skin:    General: Skin is warm and dry.     Findings: Rash present. No petechiae. Rash is macular.     Comments: Rash on right groin. Please see media below.  Neurological:     Mental Status: He is alert.        LABORATORY DATA: I have reviewed the data as listed    Latest Ref Rng & Units 12/13/2022    8:35 AM 12/06/2022   11:45 AM 11/23/2022    8:28 AM  CBC  WBC 4.0 - 10.5 K/uL 7.5  8.6  9.1   Hemoglobin 13.0 - 17.0 g/dL 16.1  09.6  04.5   Hematocrit 39.0 - 52.0 % 43.9  44.7  46.0   Platelets 150 - 400 K/uL 83  166  163         Latest Ref Rng & Units 12/13/2022    8:35 AM 12/06/2022   11:45 AM 11/23/2022    8:28 AM  CMP  Glucose 70 - 99 mg/dL 409  811  85   BUN 8 - 23 mg/dL 18  16  24    Creatinine 0.61 - 1.24 mg/dL 9.14  7.82  9.56   Sodium 135 - 145 mmol/L 136  135  138   Potassium 3.5 - 5.1 mmol/L 3.8  3.7  4.0   Chloride 98 - 111 mmol/L 102  102  104   CO2 22 - 32 mmol/L 27  27  30    Calcium 8.9 - 10.3 mg/dL 9.1  9.8  9.8   Total Protein 6.5 - 8.1 g/dL 6.6  7.7  7.9   Total Bilirubin 0.3 - 1.2 mg/dL 1.1  1.1  0.7   Alkaline Phos 38 - 126 U/L 73  89  90   AST 15 - 41 U/L 24  22  27    ALT 0 - 44 U/L 36  27  37        RADIOGRAPHIC STUDIES (from last 24 hours if applicable) I have personally reviewed the radiological images as listed and agreed with the findings in the report. No results found.      Visit Diagnosis: 1. Multiple myeloma without remission (HCC)   2. Rash      No orders of the defined types were placed in this encounter.   All questions were answered. The patient knows to call the clinic with any problems, questions or concerns. No barriers to learning was detected.  A total of more than 30 minutes were spent on this encounter with face-to-face time and non-face-to-face time, including preparing to see the patient, ordering tests and/or medications, counseling the  patient and coordination of care as outlined above.    Thank you for allowing me to participate in the care of this patient.    Shanon Ace, PA-C Department of Hematology/Oncology [facility] at [facility] Phone: [phone]  [phone]    12/19/2022 3:18 PM

## 2022-12-20 ENCOUNTER — Inpatient Hospital Stay: Payer: Medicare Other

## 2022-12-20 ENCOUNTER — Ambulatory Visit: Payer: Medicare Other

## 2022-12-20 ENCOUNTER — Encounter: Payer: Self-pay | Admitting: Medical Oncology

## 2022-12-20 ENCOUNTER — Inpatient Hospital Stay (HOSPITAL_BASED_OUTPATIENT_CLINIC_OR_DEPARTMENT_OTHER): Payer: Medicare Other | Admitting: Physician Assistant

## 2022-12-20 ENCOUNTER — Ambulatory Visit
Admission: RE | Admit: 2022-12-20 | Discharge: 2022-12-20 | Disposition: A | Discharge status: Home / Self Care | Payer: Medicare Other | Source: Ambulatory Visit | Attending: Radiation Oncology | Admitting: Radiation Oncology

## 2022-12-20 ENCOUNTER — Telehealth: Payer: Self-pay | Admitting: *Deleted

## 2022-12-20 ENCOUNTER — Other Ambulatory Visit: Payer: Self-pay

## 2022-12-20 ENCOUNTER — Ambulatory Visit: Payer: Medicare Other | Admitting: Physician Assistant

## 2022-12-20 VITALS — BP 102/74

## 2022-12-20 VITALS — BP 83/59 | HR 89 | Temp 97.8°F | Resp 18

## 2022-12-20 DIAGNOSIS — C9000 Multiple myeloma not having achieved remission: Secondary | ICD-10-CM

## 2022-12-20 DIAGNOSIS — D696 Thrombocytopenia, unspecified: Secondary | ICD-10-CM | POA: Diagnosis not present

## 2022-12-20 DIAGNOSIS — Z5112 Encounter for antineoplastic immunotherapy: Secondary | ICD-10-CM | POA: Diagnosis not present

## 2022-12-20 DIAGNOSIS — I959 Hypotension, unspecified: Secondary | ICD-10-CM | POA: Diagnosis not present

## 2022-12-20 LAB — RAD ONC ARIA SESSION SUMMARY
Course Elapsed Days: 8
Plan Fractions Treated to Date: 6
Plan Fractions Treated to Date: 7
Plan Prescribed Dose Per Fraction: 3 Gy
Plan Prescribed Dose Per Fraction: 3 Gy
Plan Total Fractions Prescribed: 10
Plan Total Fractions Prescribed: 9
Plan Total Prescribed Dose: 27 Gy
Plan Total Prescribed Dose: 30 Gy
Reference Point Dosage Given to Date: 21 Gy
Reference Point Dosage Given to Date: 21 Gy
Reference Point Session Dosage Given: 3 Gy
Reference Point Session Dosage Given: 3 Gy
Session Number: 9

## 2022-12-20 LAB — CBC WITH DIFFERENTIAL (CANCER CENTER ONLY)
Abs Immature Granulocytes: 0.08 10*3/uL — ABNORMAL HIGH (ref 0.00–0.07)
Basophils Absolute: 0 10*3/uL (ref 0.0–0.1)
Basophils Relative: 0 %
Eosinophils Absolute: 0.2 10*3/uL (ref 0.0–0.5)
Eosinophils Relative: 2 %
HCT: 40.5 % (ref 39.0–52.0)
Hemoglobin: 14.3 g/dL (ref 13.0–17.0)
Immature Granulocytes: 1 %
Lymphocytes Relative: 1 %
Lymphs Abs: 0.1 10*3/uL — ABNORMAL LOW (ref 0.7–4.0)
MCH: 33.8 pg (ref 26.0–34.0)
MCHC: 35.3 g/dL (ref 30.0–36.0)
MCV: 95.7 fL (ref 80.0–100.0)
Monocytes Absolute: 0.6 10*3/uL (ref 0.1–1.0)
Monocytes Relative: 6 %
Neutro Abs: 8.9 10*3/uL — ABNORMAL HIGH (ref 1.7–7.7)
Neutrophils Relative %: 90 %
Platelet Count: 30 10*3/uL — ABNORMAL LOW (ref 150–400)
RBC: 4.23 MIL/uL (ref 4.22–5.81)
RDW: 12.8 % (ref 11.5–15.5)
WBC Count: 9.9 10*3/uL (ref 4.0–10.5)
nRBC: 0.3 % — ABNORMAL HIGH (ref 0.0–0.2)

## 2022-12-20 MED ORDER — CETIRIZINE HCL 10 MG/ML IV SOLN: 10.0000 mg | Freq: Once | INTRAVENOUS | Status: DC

## 2022-12-20 MED ORDER — DARATUMUMAB-HYALURONIDASE-FIHJ 1800-30000 MG-UT/15ML ~~LOC~~ SOLN: 1800.0000 mg | Freq: Once | SUBCUTANEOUS | Status: DC

## 2022-12-20 MED ORDER — ACETAMINOPHEN 325 MG PO TABS: 650.0000 mg | ORAL_TABLET | Freq: Once | ORAL | Status: DC

## 2022-12-20 MED ORDER — MONTELUKAST SODIUM 10 MG PO TABS: 10.0000 mg | ORAL_TABLET | Freq: Once | ORAL | Status: DC

## 2022-12-20 MED ORDER — SODIUM CHLORIDE 0.9 % IV SOLN: Freq: Once | INTRAVENOUS | Status: AC

## 2022-12-20 NOTE — Progress Notes (Signed)
Patient seen today by Karie Fetch PA-C. Order given to hold Benadryl today due to low BP and replace with 10mg  IV certirizine. Patient also to receive fluid bolus over 1 hour. Proceed with treatment otherwise. Patient reports taking PO Decadron at home today prior to arrival.   1015 Per Dr Arbutus Ped, [facility] treatment today due to platelet count of 30. Patient instructed to hold Revlimid. Patient has called coumadin clinic for further instructions in regards to coumadin dosing.

## 2022-12-20 NOTE — Progress Notes (Signed)
  Per Dr. Arbutus Ped , cancel tx Darzalex  today due to low plts. Pt instructed to hold Revlimid.

## 2022-12-20 NOTE — Patient Instructions (Signed)
You have an appointment with the cardiologist on Thursday May 16 at 9:40 AM. This will be with Dr. Izora Ribas. Please discuss your low blow pressure and blood pressure medications.  Tomorrow morning: check your blood pressure before taking your medicine. If the systolic (top number) is above 110 you can take all of your blood pressure medications.(benazepril, amlodipine, metoprolol)  If it is between 100-110 you can take the metoprolol only.  If it is below 100 do not take any of your blood pressure medications (benazepril, amlodipine, metoprolol).  You can discontinue the steroid ointment as the rash is being caused by your low platelets.  If you experience any symptoms of dizziness, weakness, numbness, changes in speech, uncontrollable bleeding or any other new or concerning symptoms please call 911 or seek care at the closest emergency department.

## 2022-12-20 NOTE — Progress Notes (Signed)
Symptom Management Consult Note [facility]    Patient Care Team: [name], MD as PCP - General (Internal Medicine) [name], MD (Inactive) as PCP - Cardiology (Cardiology)    Name / MRN / DOB: Ronald Duran  [id number]  1941/06/21   Date of visit: 12/20/2022   Chief Complaint/Reason for visit: recheck rash, hypotension   Current Therapy: velcade and darzalex with concurrent radiation   Last treatment:  Day 11   Cycle 1 on 12/16/2022   ASSESSMENT & PLAN: Patient is a 82 y.o. 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 Arbutus Ped.  I have viewed most recent oncology note and lab work.   #Hypotension -Patient found to be hypotensive at today's visit as well as yesterday.  He admits to feeling fatigued and dizzy although thought that was from his long day of radiation yesterday. -Patient given small fluid bolus to 250 mL normal saline and blood pressure rechecked, 102/74. He feels back to his usual self. -Patient takes amlodipine, Benazepril-hydrochlorthiazide as well as metoprolol. He will need close follow up with cardiology to discuss blood pressure management. I contacted [facility] and was able to schedule patient in appointment in x 2 days. -Patient advised to check his blood pressure tomorrow morning before taking his medications.  Also encouraged to increase fluid intake in case dehydration is contributing.  #Thrombocytopenia -Recheck of rash today is now consistent with petechiae. Will discontinue kenalog. -CBC checked and platelet count 30k.  Discussed bleeding precautions with patient and spouse. -Patient held Coumadin last night due to recent elevated INR 3.8.  He will reach out to Coumadin clinic today to discuss when to take his next dose.   #Multiple myeloma with plasmacytoma - Holding treatment today 2/2  thrombocytopenia. Patient will also hold Revlimid. - Next appointment with oncologist is 12/27/22  Strict ED precautions discussed should symptoms worsen.   Heme/Onc History: Oncology History  Multiple myeloma without remission (HCC)  11/23/2022 Initial Diagnosis   Multiple myeloma without remission   12/06/2022 -  Chemotherapy   Patient is on Treatment Plan : MYELOMA NEWLY DIAGNOSED TRANSPLANT CANDIDATE DaraVRd (Daratumumab SQ) q21d x 6 Cycles (Induction/Consolidation)         Interval history-: Ronald Duran is a 82 y.o. male with oncologic history as above presenting to [facility] today with chief complaint of rash recheck and hypotension.  He is accompanied by his spouse who provides additional history.  Patient states the rash did not change overall since yesterday.  He applied the Kenalog ointment twice.  He denies any itching or pain.  When blood pressure was checked on arrival today it was low 83/59.  Patient states he did take his blood pressure medications this morning.  He has been on blood pressure medications for several years, no recent dose changes.  He is followed by cardiology, he is scheduled to establish care with Dr. Anne Fu on January 13, 2023 after his previous cardiologist retired.  Patient admits to feeling fatigued and dizzy.  He noticed it this morning when he woke up.  He thought it was because he had a long day of appointments and radiation yesterday.  He notes that he got up during the night to use the bathroom and did not feel dizzy.  He had no issues ambulating and no falls.  He denies any fever, chills, chest pain or shortness of  breath, numbness, weakness or tingling.      ROS  All other systems are reviewed and are negative for acute change except as noted in the HPI.    No Known Allergies   Past Medical History:  Diagnosis Date   Atrial fibrillation (HCC)    CAD (coronary artery disease)    a. s/p MI with LAD stent placement December, 1999 -last stress  test November 2008 with no EKG evidence of ischemia  b. cath 07/23/2015 after abnormal ETT and nuc, occluded mid LAD stent with good distal collateral, medical therapy    Cardiomyopathy (HCC)    Ischemic cardiomyopathy with EF 40% by echo 2005   COVID 2021   mild case   Dyslipidemia    Erectile dysfunction    GERD (gastroesophageal reflux disease)    Hiatal hernia    with esophageal strictures and dilatations in the past - Dr. [name]   History of nuclear stress test    Myoview 12/16: EF 47%, anterior, anteroseptal and inferoseptal defect suggesting infarct with peri-infarct ischemia, intermediate risk   HTN (hypertension)    Hx of seasonal allergies    Hyperlipidemia    Myocardial infarction (HCC) 08/01/1998   PAD (peripheral artery disease) (HCC)    Pre-diabetes    Prostate cancer (HCC)    with seed implant therapy following Lupron therapy in 2008 and 2009 - followed by Dr. Pollyann Glen    Stroke (HCC) 04/20/2017   seen on a MRI   Vitamin D deficiency      Past Surgical History:  Procedure Laterality Date   BRONCHIAL BIOPSY  11/15/2022   Procedure: BRONCHIAL BIOPSIES;  Surgeon: [name], DO;  Location: [facility] ENDOSCOPY;  Service: Pulmonary;;   BRONCHIAL BRUSHINGS  11/15/2022   Procedure: BRONCHIAL BRUSHINGS;  Surgeon: [name], DO;  Location: [facility]OS[facility];  Service: Pulmonary;;   BRONCHIAL NEEDLE ASPIRATION BIOPSY  11/15/2022   Procedure: BRONCHIAL NEEDLE ASPIRATION BIOPSIES;  Surgeon: [name], DO;  Location: [facility] ENDOSCOPY;  Service: Pulmonary;;   CARDIAC CATHETERIZATION N/A 07/23/2015   Procedure: Left Heart Cath and Coronary Angiography;  Surgeon: [name], MD;  Location: [facility] INVASIVE CV LAB;  Service: Cardiovascular;  Laterality: N/A;   CARDIOVERSION N/A 06/03/2022   Procedure: CARDIOVERSION;  Surgeon: [name], MD;  Location: [facility] ENDOSCOPY;  Service: Cardiovascular;  Laterality: N/A;   COLONOSCOPY WITH PROPOFOL N/A 11/24/2015   Procedure:  COLONOSCOPY WITH PROPOFOL;  Surgeon: [name], MD;  Location: [facility] ENDOSCOPY;  Service: Endoscopy;  Laterality: N/A;   esophageal strictures with dilatations in the past per Dr. [name]     last '98   FIDUCIAL MARKER PLACEMENT  11/15/2022   Procedure: FIDUCIAL MARKER PLACEMENT;  Surgeon: [name], DO;  Location: [facility] ENDOSCOPY;  Service: Pulmonary;;   HERNIA REPAIR Left    INTRAMEDULLARY (IM) NAIL INTERTROCHANTERIC Left 10/25/2022   Procedure: LEFT INTRAMEDULLARY (IM) NAIL INTERTROCHANTERIC;  Surgeon: [name], MD;  Location: [facility] OR;  Service: Orthopedics;  Laterality: Left;   IR BONE MARROW BIOPSY & ASPIRATION  11/17/2022   IR BONE TUMOR(S)RF ABLATION  10/11/2022   IR KYPHO THORACIC WITH BONE BIOPSY  10/11/2022   LAD stent placement - Dr. [name]     left inguinal hernia repair - Dr. [name]     prostatic radioactive seed implantation - Dr. [name]     '09    Social History   Socioeconomic History   Marital status: Married  Spouse name: ,Talbert Forest   Number of children: 4   Years of education: Not on file   Highest education level: Not on file  Occupational History   Not on file  Tobacco Use   Smoking status: Never    Passive exposure: Past   Smokeless tobacco: Never  Vaping Use   Vaping Use: Never used  Substance and Sexual Activity   Alcohol use: Not Currently   Drug use: No   Sexual activity: Not Currently  Other Topics Concern   Not on file  Social History Narrative   Not on file   Social Determinants of Health   Financial Resource Strain: Not on file  Food Insecurity: No Food Insecurity (10/12/2022)   Hunger Vital Sign    Worried About Running Out of Food in the Last Year: Never true    Ran Out of Food in the Last Year: Never true  Transportation Needs: No Transportation Needs (10/12/2022)   PRAPARE - Administrator, Civil Service (Medical): No    Lack of Transportation (Non-Medical): No  Physical Activity: Not on file  Stress:  Not on file  Social Connections: Not on file  Intimate Partner Violence: Not At Risk (10/12/2022)   Humiliation, Afraid, Rape, and Kick questionnaire    Fear of Current or Ex-Partner: No    Emotionally Abused: No    Physically Abused: No    Sexually Abused: No    Family History  Problem Relation Age of Onset   CVA Mother    CVA Father 66   CAD Sister      Current Outpatient Medications:    acyclovir (ZOVIRAX) 200 MG capsule, Take 1 capsule (200 mg total) by mouth 2 (two) times daily., Disp: 60 capsule, Rfl: 5   amLODipine (NORVASC) 5 MG tablet, Take 1 tablet (5 mg total) by mouth daily., Disp: 90 tablet, Rfl: 2   atorvastatin (LIPITOR) 40 MG tablet, Take 40 mg by mouth at bedtime., Disp: , Rfl:    B Complex Vitamins (B COMPLEX 100) tablet, Take 1 tablet by mouth daily. Balance, Disp: , Rfl:    benazepril-hydrochlorthiazide (LOTENSIN HCT) 20-12.5 MG tablet, Take 1 tablet by mouth daily., Disp: 90 tablet, Rfl: 2   cetirizine (ZYRTEC) 10 MG tablet, Take 10 mg by mouth daily., Disp: , Rfl:    Cholecalciferol (VITAMIN D3) 2000 UNITS TABS, Take 2,000 Units by mouth daily., Disp: , Rfl:    Coenzyme Q10 (CO Q-10) 100 MG CAPS, Take 100 mg by mouth daily with supper., Disp: , Rfl:    dexamethasone (DECADRON) 4 MG tablet, 5 tab weekly start with the first dose of chemotherapy., Disp: 40 tablet, Rfl: 3   lenalidomide (REVLIMID) 25 MG capsule, Take 1 capsule (25 mg total) by mouth daily. Take for 14 days, then none for 7 days. Repeat every 21 days. Start Revlimid on day 1 of chemotherapy Auth #=[id number] date obtained 12/19/2022 Adult Male., Disp: 14 capsule, Rfl: 0   metoprolol succinate (TOPROL XL) 25 MG 24 hr tablet, Take 1 tablet (25 mg total) by mouth daily., Disp: 90 tablet, Rfl: 3   Multiple Vitamins-Minerals (CENTRUM) tablet, Take 1 tablet by mouth daily., Disp: , Rfl:    nitroGLYCERIN (NITROSTAT) 0.4 MG SL tablet, PLACE 1 TABLET UNDER THE TONGUE IF NEEDED EVERY 5 MINUTES FOR CHEST PAIN FOR 3  DOSES IF NO RELIEF AFTER FIRST DOSE CALL 911. (Patient not taking: Reported on 12/13/2022), Disp: 25 tablet, Rfl: 9   omeprazole (PRILOSEC) 20 MG capsule, Take  20 mg by mouth daily before breakfast., Disp: , Rfl:    prochlorperazine (COMPAZINE) 10 MG tablet, Take 1 tablet (10 mg total) by mouth every 6 (six) hours as needed for nausea or vomiting. (Patient not taking: Reported on 12/13/2022), Disp: 30 tablet, Rfl: 1   triamcinolone ointment (KENALOG) 0.5 %, Apply 1 Application topically 2 (two) times daily., Disp: 30 g, Rfl: 0   warfarin (COUMADIN) 5 MG tablet, TAKE 1/2 TO 1 TABLET BY MOUTH DAILY AS DIRECTED BY COUMADIN CLINIC (Patient taking differently: Take 2.5-5 mg by mouth See admin instructions. Take 2.5 mg by mouth on Monday, Wednesday and Friday and take 5 mg on Tuesday, Thursday, Saturday and Sunday), Disp: 100 tablet, Rfl: 0  PHYSICAL EXAM: ECOG FS:1 - Symptomatic but completely ambulatory    Vitals:   12/20/22 0852  BP: (!) 83/59  Pulse: 89  Resp: 18  Temp: 97.8 F (36.6 C)  TempSrc: Oral  SpO2: 96%   Physical Exam Vitals and nursing note reviewed.  Constitutional:      Appearance: He is not ill-appearing or toxic-appearing.  HENT:     Head: Normocephalic.  Eyes:     Conjunctiva/sclera: Conjunctivae normal.  Cardiovascular:     Rate and Rhythm: Normal rate. Rhythm irregular.     Pulses: Normal pulses.     Heart sounds: Normal heart sounds.  Pulmonary:     Effort: Pulmonary effort is normal.     Breath sounds: Normal breath sounds.  Abdominal:     General: There is no distension.  Musculoskeletal:     Cervical back: Normal range of motion.  Skin:    General: Skin is warm and dry.     Findings: Petechiae (right lower abdomen and groin) present.     Comments: Please see media below  Neurological:     Mental Status: He is alert.     Comments: No focal weakness. Patient alert and oriented. Clear speech, follows directions.        LABORATORY DATA: I have  reviewed the data as listed    Latest Ref Rng & Units 12/20/2022    8:18 AM 12/13/2022    8:35 AM 12/06/2022   11:45 AM  CBC  WBC 4.0 - 10.5 K/uL 9.9  7.5  8.6   Hemoglobin 13.0 - 17.0 g/dL 16.1  09.6  04.5   Hematocrit 39.0 - 52.0 % 40.5  43.9  44.7   Platelets 150 - 400 K/uL 30  83  166         Latest Ref Rng & Units 12/13/2022    8:35 AM 12/06/2022   11:45 AM 11/23/2022    8:28 AM  CMP  Glucose 70 - 99 mg/dL 409  811  85   BUN 8 - 23 mg/dL 18  16  24    Creatinine 0.61 - 1.24 mg/dL 9.14  7.82  9.56   Sodium 135 - 145 mmol/L 136  135  138   Potassium 3.5 - 5.1 mmol/L 3.8  3.7  4.0   Chloride 98 - 111 mmol/L 102  102  104   CO2 22 - 32 mmol/L 27  27  30    Calcium 8.9 - 10.3 mg/dL 9.1  9.8  9.8   Total Protein 6.5 - 8.1 g/dL 6.6  7.7  7.9   Total Bilirubin 0.3 - 1.2 mg/dL 1.1  1.1  0.7   Alkaline Phos 38 - 126 U/L 73  89  90   AST 15 - 41 U/L 24  22  27   ALT 0 - 44 U/L 36  27  37        RADIOGRAPHIC STUDIES (from last 24 hours if applicable) I have personally reviewed the radiological images as listed and agreed with the findings in the report. No results found.      Visit Diagnosis: 1. Hypotension, unspecified hypotension type   2. Thrombocytopenia (HCC)   3. Multiple myeloma without remission (HCC)      No orders of the defined types were placed in this encounter.   All questions were answered. The patient knows to call the clinic with any problems, questions or concerns. No barriers to learning was detected.  A total of more than 30 minutes were spent on this encounter with face-to-face time and non-face-to-face time, including preparing to see the patient, ordering tests and/or medications, counseling the patient and coordination of care as outlined above.    Thank you for allowing me to participate in the care of this patient.    Shanon Ace, PA-C Department of Hematology/Oncology [facility] Phone:  [phone]  [phone]    12/20/2022 12:09 PM

## 2022-12-20 NOTE — Patient Instructions (Signed)
Bleeding Precautions When on Anticoagulant Therapy, Adult Anticoagulant therapy, also called blood thinner therapy, is medicine that helps to prevent and treat blood clots. The medicine works by stopping blood clots from forming or growing. Blood clots that form in your blood vessels can be dangerous. They can break loose and travel to the heart, lungs, or brain. This increases the risk of a heart attack, stroke, or blocked lung artery (pulmonary embolism). Anticoagulants also increase the risk of bleeding. It is important to take anticoagulants exactly as told by your health care provider. Why do I need to be on anticoagulant therapy? You may need this medicine if you are at risk of developing a blood clot. Conditions that increase your risk of a blood clot include: Having an irregular heartbeat, such as atrial fibrillation (AF). Having had surgery, such as: A valve replacement. A hip or knee replacement. Having been born with a heart problem (congenital heart disease). Having certain types of cancer. Having certain diseases that can increase blood clotting. Having a high risk of stroke or heart attack. What are the common anticoagulant medicines? There are several types of anticoagulant medicines. They each work in different ways to prevent blood clots. The most common types are: Medicines that you take by mouth (oralmedicines), such as: Warfarin. Direct oral anticoagulants (DOACs), such as: Dabigatran. Apixaban, edoxaban, and rivaroxaban. Injections, such as: Heparin. Enoxaparin or fondaparinux. What do I need to remember while on anticoagulant therapy? Taking anticoagulants You may bleed more easily while taking anticoagulants. Be careful not to injure yourself. Take your medicine at the same time every day. If you forget to take your medicine, take it as soon as you remember. Do not double your dosage of medicine if you miss a whole day. If it is almost time for your next dose, take  only that dose and call your health care provider. Do not stop taking your medicine unless your health care provider approves. Stopping the medicine can increase your risk of developing a blood clot. You may need to have regular blood tests while you are taking anticoagulants. If you are taking warfarin, you will need a blood test to measure the time that it takes your blood to clot (INR test), as told by your health care provider. Taking other medicines Take over-the-counter and prescription medicines only as told by your health care provider. Talk with your health care provider before you take any medicines that contain aspirin or NSAIDs, such as ibuprofen. These medicines increase your risk for dangerous bleeding. Ask your health care provider before you start taking any new medicines, vitamins, herbs, or supplements because they could interfere with your therapy. General instructions If you are pregnant or trying to get pregnant, talk with a health care provider about anticoagulants. Some of these medicines are not safe to take during pregnancy. Tell all health care providers, including your dentist, that you are on anticoagulant therapy. It is especially important to tell providers before you have any surgery, medical procedures, or dental work done. Keep all follow-up visits. This is important. This includes blood tests. What precautions should I take?  Avoid situations that cause bleeding. Consider the following: Use a softer toothbrush. Floss with waxed, not unwaxed, dental floss. Shave with an electric razor, not a blade. Be careful when handling and using sharp objects. Wear gloves while you do yard work. Avoid activities such as contact sports that may put you at risk for injury. Always wear your seat belt. Prevent falls by: Removing loose rugs   and extensions cords from areas where you walk. Using a cane or walker if you need it. Keeping a night-light on at night. Always wear  shoes outdoors, and wear non-skid slippers indoors. Be careful when cutting your fingernails and toenails. Discuss ways to manage or avoid constipation with your health care provider. Place non-skid bath mats in the bathtub. If possible, install grab bars. What other precautions are important if on warfarin therapy? If you are taking a type of anticoagulant called warfarin, make sure you: Work with a dietitian to make an eating plan. Do not make any sudden changes to your diet without talking with your health care provider. Do not drink alcohol. It can interfere with your medicine and increase your risk of bleeding. Tell your health care provider if you stop smoking. Your warfarin dose may need to be decreased. Call your health care provider about any medicine changes, including if you start taking antibiotics for an infection, as this may affect your INR level. Get regular blood tests to check your INR level as told by your health care provider. What are some questions to ask my health care provider? What is the best anticoagulant therapy for my condition? How long will I need anticoagulant therapy? What are the side effects of anticoagulant therapy? When should I take my medicine? What should I do if I forget to take it? Will I need to have regular blood tests? Do I need to change my diet? Are there foods or drinks that I should avoid? What activities are safe for me? What should I do if I want to get pregnant? What should I do if I have an illness that causes vomiting or diarrhea? What should I do if I am unable to eat for a few days? Contact a health care provider if: You miss a dose of medicine and are not sure what to do. You have minor bleeding, such as nosebleeds or bleeding gums. You have: Menstrual bleeding that is heavier than normal. Blood in your urine, or urine that is pink or brown. Easy bruising. Black and tarry stool (feces) or bright red stool. A rash or other skin  reaction from the medicine. You become pregnant. Get help right away if: You have bleeding that will not stop after 20 minutes of holding pressure. You have a severe headache or stomachache. You have bloody vomit or you cough up blood, or your vomit looks like coffee grounds. You fall or hit your head. You have sudden numbness or weakness of the face, arm, or leg. You have trouble walking. You have confusion. These symptoms may represent a serious problem that is an emergency. Do not wait to see if the symptoms will go away. Get medical help right away. Call your local emergency services (911 in the [place] Do not drive yourself to the hospital. Summary Anticoagulant therapy, also called blood thinner therapy, is medicine that helps to prevent and treat blood clots. Anticoagulants can increase the risk of bleeding. It is important that you take anticoagulants exactly as told by your health care provider. Get help right away for bleeding that does not stop or if you fall and hit your head. Talk with your health care provider about any precautions that you should take while on anticoagulant therapy. Take over-the-counter and prescription medicines only as told by your health care provider. This information is not intended to replace advice given to you by your health care provider. Make sure you discuss any questions you have with your   health care provider. Document Revised: 09/21/2020 Document Reviewed: 09/21/2020 Elsevier Patient Education  2023 Elsevier Inc.  

## 2022-12-20 NOTE — Telephone Encounter (Signed)
Pt currently at cancer center. Pt's wife and Ellery Plunk (from cancer center) called.  Pt's platelet count is at 30,000 and calling to see if the coumadin clinic would like to make any other recommendations to pt's warfarin dosing.   Ryan stated Dr. Gwenyth Bouillon has recommending pt hold warfarin another day but was wanting the coumadin clinic recommendations.   Spoke and discussed with Lars Pinks D, who recommended pt continue to take warfarin instructions given yesterday and have pt come in sooner to have INR checked. Updated Alycia Rossetti and wife. Rescheduled pt to come in on 12/26/2022 for INR check.

## 2022-12-21 ENCOUNTER — Ambulatory Visit
Admission: RE | Admit: 2022-12-21 | Discharge: 2022-12-21 | Disposition: A | Discharge status: Home / Self Care | Payer: Medicare Other | Source: Ambulatory Visit | Attending: Radiation Oncology | Admitting: Radiation Oncology

## 2022-12-21 ENCOUNTER — Ambulatory Visit: Payer: Medicare Other | Admitting: Radiation Oncology

## 2022-12-21 ENCOUNTER — Other Ambulatory Visit: Payer: Self-pay

## 2022-12-21 DIAGNOSIS — Z5112 Encounter for antineoplastic immunotherapy: Secondary | ICD-10-CM | POA: Diagnosis not present

## 2022-12-21 LAB — RAD ONC ARIA SESSION SUMMARY
Course Elapsed Days: 9
Plan Fractions Treated to Date: 7
Plan Fractions Treated to Date: 8
Plan Prescribed Dose Per Fraction: 3 Gy
Plan Prescribed Dose Per Fraction: 3 Gy
Plan Total Fractions Prescribed: 10
Plan Total Fractions Prescribed: 9
Plan Total Prescribed Dose: 27 Gy
Plan Total Prescribed Dose: 30 Gy
Reference Point Dosage Given to Date: 24 Gy
Reference Point Dosage Given to Date: 24 Gy
Reference Point Session Dosage Given: 3 Gy
Reference Point Session Dosage Given: 3 Gy
Session Number: 10

## 2022-12-21 NOTE — Progress Notes (Unsigned)
Electrophysiology Office Note:    Date:  12/22/2022   ID:  Ronald Duran, DOB May 25, 1941, MRN [id number]  PCP:  [name], MD   Ronald Duran Providers Cardiologist:  [name], MD     Referring MD: [name], MD   CC: Transition to new cardiologist  History of Present Illness:    Ronald Duran is a 82 y.o. male with a hx of CAD, ischemic cardiomyopathy (EF 40%), PAF, Multiple Myeloma with no evidence of AL cardiac amyloidosis whose therapy has been held for thrombocytopenia. 2023: Seen for AF.  He is a former patient of Ronald Duran  No chest pain or pressure .  No SOB/DOE and no PND/Orthopnea.  No weight gain or leg swelling.  No palpitations or syncope.  He has started therapy for radiation for NSCLC. His chemo is stopped for thrombocytopenia; but his heme/onc team feels it is safe to continue [facility]. Notes BP is near 110/60s.    Past Medical History:  Diagnosis Date   Atrial fibrillation (HCC)    CAD (coronary artery disease)    a. s/p MI with LAD stent placement December, 1999 -last stress test November 2008 with no EKG evidence of ischemia  b. cath 07/23/2015 after abnormal ETT and nuc, occluded mid LAD stent with good distal collateral, medical therapy    Cardiomyopathy (HCC)    Ischemic cardiomyopathy with EF 40% by echo 2005   COVID 2021   mild case   Dyslipidemia    Erectile dysfunction    GERD (gastroesophageal reflux disease)    Hiatal hernia    with esophageal strictures and dilatations in the past - Ronald Duran   History of nuclear stress test    Myoview 12/16: EF 47%, anterior, anteroseptal and inferoseptal defect suggesting infarct with peri-infarct ischemia, intermediate risk   HTN (hypertension)    Hx of seasonal allergies    Hyperlipidemia    Myocardial infarction (HCC) 08/01/1998   PAD (peripheral artery disease) (HCC)    Pre-diabetes    Prostate cancer (HCC)    with seed implant therapy following Lupron  therapy in 2008 and 2009 - followed by Ronald Duran    Stroke (HCC) 04/20/2017   seen on a MRI   Vitamin D deficiency     Past Surgical History:  Procedure Laterality Date   BRONCHIAL BIOPSY  11/15/2022   Procedure: BRONCHIAL BIOPSIES;  Surgeon: Ronald Igo, DO;  Location: [facility];  Service: Pulmonary;;   BRONCHIAL BRUSHINGS  11/15/2022   Procedure: BRONCHIAL BRUSHINGS;  Surgeon: Ronald Igo, DO;  Location: [facility];  Service: Pulmonary;;   BRONCHIAL NEEDLE ASPIRATION BIOPSY  11/15/2022   Procedure: BRONCHIAL NEEDLE ASPIRATION BIOPSIES;  Surgeon: Ronald Igo, DO;  Location: [facility];  Service: Pulmonary;;   CARDIAC CATHETERIZATION N/A 07/23/2015   Procedure: Left Heart Cath and Coronary Angiography;  Surgeon: Ronald Records, MD;  Location: [facility] INVASIVE CV LAB;  Service: Cardiovascular;  Laterality: N/A;   CARDIOVERSION N/A 06/03/2022   Procedure: CARDIOVERSION;  Surgeon: [name], MD;  Location[facility] ENDOSCOPY;  Service: Cardiovascular;  Laterality: N/A;   COLONOSCOPY WITH PROPOFOL N/A 11/24/2015   Procedure: COLONOSCOPY WITH PROPOFOL;  Surgeon: Ronald Bumpers, MD;  Location: [facility]OSCOPY;  Service: Endoscopy;  Laterality: N/A;   esophageal strictures with dilatations in the past per Ronald Duran     last '98   FIDUCIAL MARKER PLACEMENT  11/15/2022   Procedure: FIDUCIAL MARKER PLACEMENT;  Surgeon: Ronald Igo, DO;  Location: [facility];  Service: Pulmonary;;   HERNIA REPAIR Left    INTRAMEDULLARY (IM) NAIL INTERTROCHANTERIC Left 10/25/2022   Procedure: LEFT INTRAMEDULLARY (IM) NAIL INTERTROCHANTERIC;  Surgeon: Ronald Kos, MD;  Location: [facility] OR;  Service: Orthopedics;  Laterality: Left;   IR BONE MARROW BIOPSY & ASPIRATION  11/17/2022   IR BONE TUMOR(S)RF ABLATION  10/11/2022   IR KYPHO THORACIC WITH BONE BIOPSY  10/11/2022   LAD stent placement - Dr. Verdis Duran     left inguinal hernia repair - Dr. Jerelene Duran     prostatic radioactive seed  implantation - Ronald Duran     '09    Current Medications: Current Meds  Medication Sig   acyclovir (ZOVIRAX) 200 MG capsule Take 1 capsule (200 mg total) by mouth 2 (two) times daily.   atorvastatin (LIPITOR) 40 MG tablet Take 40 mg by mouth at bedtime.   B Complex Vitamins (B COMPLEX 100) tablet Take 1 tablet by mouth daily. Balance   cetirizine (ZYRTEC) 10 MG tablet Take 10 mg by mouth daily.   Cholecalciferol (VITAMIN D3) 2000 UNITS TABS Take 2,000 Units by mouth daily.   Coenzyme Q10 (CO Q-10) 100 MG CAPS Take 100 mg by mouth daily with supper.   dexamethasone (DECADRON) 4 MG tablet 5 tab weekly start with the first dose of chemotherapy.   lenalidomide (REVLIMID) 25 MG capsule Take 1 capsule (25 mg total) by mouth daily. Take for 14 days, then none for 7 days. Repeat every 21 days. Start Revlimid on day 1 of chemotherapy Auth #=[id number] date obtained 12/19/2022 Adult Male.   metoprolol succinate (TOPROL XL) 25 MG 24 hr tablet Take 1 tablet (25 mg total) by mouth daily.   Multiple Vitamins-Minerals (CENTRUM) tablet Take 1 tablet by mouth daily.   nitroGLYCERIN (NITROSTAT) 0.4 MG SL tablet PLACE 1 TABLET UNDER THE TONGUE IF NEEDED EVERY 5 MINUTES FOR CHEST PAIN FOR 3 DOSES IF NO RELIEF AFTER FIRST DOSE CALL 911.   omeprazole (PRILOSEC) 20 MG capsule Take 20 mg by mouth daily before breakfast.   prochlorperazine (COMPAZINE) 10 MG tablet Take 1 tablet (10 mg total) by mouth every 6 (six) hours as needed for nausea or vomiting.   triamcinolone ointment (KENALOG) 0.5 % Apply 1 Application topically 2 (two) times daily.   warfarin (COUMADIN) 5 MG tablet TAKE 1/2 TO 1 TABLET BY MOUTH DAILY AS DIRECTED BY COUMADIN CLINIC (Patient taking differently: Take 2.5-5 mg by mouth See admin instructions. Take 2.5 mg by mouth on Monday, Wednesday and Friday and take 5 mg on Tuesday, Thursday, Saturday and Sunday)   [DISCONTINUED] amLODipine (NORVASC) 5 MG tablet Take 1 tablet (5 mg total) by mouth daily.    [DISCONTINUED] benazepril-hydrochlorthiazide (LOTENSIN HCT) 20-12.5 MG tablet Take 1 tablet by mouth daily.     Allergies:   Patient has no known allergies.   Social History   Socioeconomic History   Marital status: Married    Spouse name: ,Talbert Forest   Number of children: 4   Years of education: Not on file   Highest education level: Not on file  Occupational History   Not on file  Tobacco Use   Smoking status: Never    Passive exposure: Past   Smokeless tobacco: Never  Vaping Use   Vaping Use: Never used  Substance and Sexual Activity   Alcohol use: Not Currently   Drug use: No   Sexual activity: Not Currently  Other Topics Concern  Not on file  Social History Narrative   Not on file   Social Determinants of Health   Financial Resource Strain: Not on file  Food Insecurity: No Food Insecurity (10/12/2022)   Hunger Vital Sign    Worried About Running Out of Food in the Last Year: Never true    Ran Out of Food in the Last Year: Never true  Transportation Needs: No Transportation Needs (10/12/2022)   PRAPARE - Administrator, Civil Service (Medical): No    Lack of Transportation (Non-Medical): No  Physical Activity: Not on file  Stress: Not on file  Social Connections: Not on file     Family History: The patient's family history includes CAD in his sister; CVA in his mother; CVA (age of onset: 72) in his father.  ROS:   Please see the history of present illness.    All other systems reviewed and are negative.  EKGs/Labs/Other Studies Reviewed Today:    EKG:  Last EKG results: today - AF, old anterior MI  Cardiac Studies & Procedures   CARDIAC CATHETERIZATION  CARDIAC CATHETERIZATION 07/23/2015  Narrative 1. Mid LAD lesion, 99% stenosed. The lesion was previously treated with a stent (bare metal). LAD receives collaterals from the right coronary 2. Mid LAD to Dist LAD lesion, 80% stenosed.   Total occlusion of the mid LAD after the first diagonal  within the previously stented segment. Well-developed apical collaterals from the dominant right coronary.  Widely patent dominant RCA and circumflex coronary arteries.  Akinesis of the apical segment with intramyocardial calcification. EF 45-50%.  The anatomy as described above explains the abnormalities noted on the recent myocardial perfusion study. In the absence of symptoms, conservative medical management will continue.  Recommendations:   Continue aggressive risk factor modification  Continue walking program  Notify if any anginal symptoms.  Findings Coronary Findings Diagnostic  Dominance: Right  Left Anterior Descending Collaterals Dist LAD filled by collaterals from RPDA.  The lesion was previously treated with a stent (unknown type). Diffuse.  Intervention  No interventions have been documented.   STRESS TESTS  MYOCARDIAL PERFUSION IMAGING 07/13/2015  Narrative  Nuclear stress EF: 47%.  There was no ST segment deviation noted during stress.  Findings consistent with prior myocardial infarction with peri-infarct ischemia.  This is an intermediate risk study.  The left ventricular ejection fraction is mildly decreased (45-54%).  1. There was a large, severe mid to apical anterior, anteroseptal, and inferoseptal perfusion defect.  This defect was primarily fixed with some reversibility, suggesting infarction with peri-infarct ischemia. 2. EF 47% with peri-apical hypokinesis. 3. Intermediate risk study (primarily infarction).   ECHOCARDIOGRAM  ECHOCARDIOGRAM COMPLETE 10/21/2021  Narrative ECHOCARDIOGRAM REPORT    Patient Name:   DILLINGER RUVALCABA Date of Exam: 10/21/2021 Medical Rec #:  [id number]        Height:       72.0 in Accession #:    [id number]       Weight:       193.4 lb Date of Birth:  1940-10-28       BSA:          2.100 m Patient Age:    80 years         BP:           122/78 mmHg Patient Gender: M                HR:           53  bpm. Exam Location:  [address]  Procedure: 2D Echo, Cardiac Doppler, Color Doppler and Intracardiac Opacification Agent  Indications:    I50.9 CHF  History:        Patient has prior history of Echocardiogram examinations. CHF, CAD and Previous Myocardial Infarction, PAD and Stroke, Arrythmias:Bradycardia; Risk Factors:Hypertension, Dyslipidemia and Family History of Coronary Artery Disease. Ischemic Cardiomyopathy (prior EF in 2018, 40%).  Sonographer:    Farrel Conners RDCS Referring Phys: Barry Dienes [facility]  IMPRESSIONS   1. Left ventricular ejection fraction, by estimation, is 45 to 50%. The left ventricle has mildly decreased function. The left ventricle demonstrates regional wall motion abnormalities (see scoring diagram/findings for description). Left ventricular diastolic parameters are indeterminate. There is moderate hypokinesis of the left ventricular, mid-apical inferior wall. There is of the left ventricular, apical apical segment. 2. Right ventricular systolic function is normal. The right ventricular size is normal. There is normal pulmonary artery systolic pressure. 3. Left atrial size was moderately dilated. 4. Right atrial size was mild to moderately dilated. 5. The mitral valve is normal in structure. Trivial mitral valve regurgitation. No evidence of mitral stenosis. 6. The aortic valve is tricuspid. There is mild calcification of the aortic valve. Aortic valve regurgitation is trivial. Aortic valve sclerosis is present, with no evidence of aortic valve stenosis. 7. The inferior vena cava is normal in size with greater than 50% respiratory variability, suggesting right atrial pressure of 3 mmHg.  Comparison(s): Prior images unable to be directly viewed, comparison made by report only.  Conclusion(s)/Recommendation(s): Mildly reduced LVEF with akinetic apex and hypokinetic mid to distal inferior wall. No LV thormbus visualized with echo contrast.  FINDINGS Left  Ventricle: Left ventricular ejection fraction, by estimation, is 45 to 50%. The left ventricle has mildly decreased function. The left ventricle demonstrates regional wall motion abnormalities. Moderate hypokinesis of the left ventricular, mid-apical inferior wall. Definity contrast agent was given IV to delineate the left ventricular endocardial borders. The left ventricular internal cavity size was normal in size. There is no left ventricular hypertrophy. Left ventricular diastolic parameters are indeterminate.  Right Ventricle: The right ventricular size is normal. No increase in right ventricular wall thickness. Right ventricular systolic function is normal. There is normal pulmonary artery systolic pressure. The tricuspid regurgitant velocity is 2.74 m/s, and with an assumed right atrial pressure of 3 mmHg, the estimated right ventricular systolic pressure is 33.0 mmHg.  Left Atrium: Left atrial size was moderately dilated.  Right Atrium: Right atrial size was mild to moderately dilated.  Pericardium: There is no evidence of pericardial effusion.  Mitral Valve: The mitral valve is normal in structure. Trivial mitral valve regurgitation. No evidence of mitral valve stenosis.  Tricuspid Valve: The tricuspid valve is normal in structure. Tricuspid valve regurgitation is mild . No evidence of tricuspid stenosis.  Aortic Valve: The aortic valve is tricuspid. There is mild calcification of the aortic valve. Aortic valve regurgitation is trivial. Aortic regurgitation PHT measures 1006 msec. Aortic valve sclerosis is present, with no evidence of aortic valve stenosis. Aortic valve mean gradient measures 7.0 mmHg. Aortic valve peak gradient measures 13.2 mmHg. Aortic valve area, by VTI measures 1.72 cm.  Pulmonic Valve: The pulmonic valve was not well visualized. Pulmonic valve regurgitation is not visualized. No evidence of pulmonic stenosis.  Aorta: The aortic root, ascending aorta and aortic  arch are all structurally normal, with no evidence of dilitation or obstruction.  Venous: The inferior vena cava is normal in size with greater than  50% respiratory variability, suggesting right atrial pressure of 3 mmHg.  IAS/Shunts: The atrial septum is grossly normal.   LEFT VENTRICLE PLAX 2D LVIDd:         5.65 cm   Diastology LVIDs:         3.55 cm   LV e' medial:    10.40 cm/s LV PW:         1.00 cm   LV E/e' medial:  8.6 LV IVS:        0.90 cm   LV e' lateral:   10.20 cm/s LVOT diam:     2.00 cm   LV E/e' lateral: 8.7 LV SV:         77 LV SV Index:   37 LVOT Area:     3.14 cm   RIGHT VENTRICLE RV S Duran:     11.20 cm/s TAPSE (M-mode): 1.6 cm  LEFT ATRIUM              Index        RIGHT ATRIUM           Index LA diam:        4.40 cm  2.09 cm/m   RA Area:     18.20 cm LA Vol (A2C):   100.0 ml 47.61 ml/m  RA Volume:   52.10 ml  24.80 ml/m LA Vol (A4C):   74.4 ml  35.42 ml/m LA Biplane Vol: 87.7 ml  41.75 ml/m AORTIC VALVE AV Area (Vmax):    1.56 cm AV Area (Vmean):   1.56 cm AV Area (VTI):     1.72 cm AV Vmax:           181.50 cm/s AV Vmean:          123.000 cm/s AV VTI:            0.448 m AV Peak Grad:      13.2 mmHg AV Mean Grad:      7.0 mmHg LVOT Vmax:         90.40 cm/s LVOT Vmean:        61.100 cm/s LVOT VTI:          0.246 m LVOT/AV VTI ratio: 0.55 AI PHT:            1006 msec  AORTA Ao Root diam: 3.00 cm Ao Asc diam:  3.70 cm  MITRAL VALVE               TRICUSPID VALVE MV Area (PHT): cm         TR Peak grad:   30.0 mmHg MV Decel Time: 229 msec    TR Vmax:        274.00 cm/s MV E velocity: 88.93 cm/s MV A velocity: 82.70 cm/s  SHUNTS MV E/A ratio:  1.08        Systemic VTI:  0.25 m Systemic Diam: 2.00 cm  Jodelle Red MD Electronically signed by Jodelle Red MD Signature Date/Time: 10/21/2021/1:53:22 PM    Final              Recent Labs: 12/13/2022: ALT 36; BUN 18; Creatinine 1.06; Potassium 3.8; Sodium  136 12/20/2022: Hemoglobin 14.3; Platelet Count 30     Physical Exam:    VS:  BP (!) 86/61   Pulse 90   Ht 6' (1.829 m)   Wt 192 lb (87.1 kg)   SpO2 99%   BMI 26.04 kg/m     Wt Readings from Last 3 Encounters:  12/22/22 192 lb (87.1 kg)  12/19/22 193 lb 0.2 oz (87.5 kg)  12/16/22 193 lb 4 oz (87.7 kg)    Gen: no distress,   Neck: No JVD Cardiac: No Rubs or Gallops, no Murmur, RRR +2 radial pulses Respiratory: Clear to auscultation bilaterally, normal effort, normal  respiratory rate GI: Soft, nontender, non-distended  MS: No  edema;  moves all extremities Integument: Skin feels warm Neuro:  At time of evaluation, alert and oriented to person/place/time/situation  Psych: Normal affect, patient feels well   ASSESSMENT & PLAN:    CAD HLD NSCLS with high dose radiation - asymptomatic - atorvastatin 40 mg, LDL goal < 55; can re-address in f/u visits - metoprolol 25 mg PO Daily; if still BP < 100 in two weeks stop this  Cardiomyopathy and hypotension - EF 45-50% - asymptomatic - potential SGLT2i if BP recovers - stop diuretic, stop ACEi, stop CCB - discussed hydration; will call for HF sx  Paraoxsymal atrial fibrillation:  - continue BB; followed by EP   Multiple myeloma Secondary hypercoagulable state:  Thromboctyopenia - We have asked his heme/onc team to less know if we must hold AC due to low platelets - otherwise continue warfarin    Time Spent Directly with Patient:   I have spent a total of 40 minutes with the patient reviewing notes, imaging, EKGs, labs and examining the patient as well as establishing an assessment and plan that was discussed personally with the patient.  > 50% of time was spent in direct patient care and family.         Medication Adjustments/Labs and Tests Ordered: Current medicines are reviewed at length with the patient today.  Concerns regarding medicines are outlined above.  No orders of the defined types were placed in  this encounter.  No orders of the defined types were placed in this encounter.    Signed, [name], MD  12/22/2022 11:26 AM    [place] Duran

## 2022-12-22 ENCOUNTER — Encounter: Payer: Self-pay | Admitting: Internal Medicine

## 2022-12-22 ENCOUNTER — Other Ambulatory Visit: Payer: Self-pay

## 2022-12-22 ENCOUNTER — Ambulatory Visit: Payer: Medicare Other | Attending: Cardiology | Admitting: Internal Medicine

## 2022-12-22 ENCOUNTER — Ambulatory Visit
Admission: RE | Admit: 2022-12-22 | Discharge: 2022-12-22 | Disposition: A | Discharge status: Home / Self Care | Payer: Medicare Other | Source: Ambulatory Visit | Attending: Radiation Oncology | Admitting: Radiation Oncology

## 2022-12-22 VITALS — BP 86/61 | HR 90 | Ht 72.0 in | Wt 192.0 lb

## 2022-12-22 DIAGNOSIS — I6349 Cerebral infarction due to embolism of other cerebral artery: Secondary | ICD-10-CM | POA: Diagnosis not present

## 2022-12-22 DIAGNOSIS — I5022 Chronic systolic (congestive) heart failure: Secondary | ICD-10-CM

## 2022-12-22 DIAGNOSIS — Z5112 Encounter for antineoplastic immunotherapy: Secondary | ICD-10-CM | POA: Diagnosis not present

## 2022-12-22 DIAGNOSIS — C419 Malignant neoplasm of bone and articular cartilage, unspecified: Secondary | ICD-10-CM

## 2022-12-22 DIAGNOSIS — C7951 Secondary malignant neoplasm of bone: Secondary | ICD-10-CM

## 2022-12-22 DIAGNOSIS — I10 Essential (primary) hypertension: Secondary | ICD-10-CM | POA: Diagnosis not present

## 2022-12-22 DIAGNOSIS — C61 Malignant neoplasm of prostate: Secondary | ICD-10-CM

## 2022-12-22 DIAGNOSIS — I4891 Unspecified atrial fibrillation: Secondary | ICD-10-CM | POA: Diagnosis not present

## 2022-12-22 DIAGNOSIS — I2510 Atherosclerotic heart disease of native coronary artery without angina pectoris: Secondary | ICD-10-CM

## 2022-12-22 DIAGNOSIS — C3491 Malignant neoplasm of unspecified part of right bronchus or lung: Secondary | ICD-10-CM

## 2022-12-22 LAB — RAD ONC ARIA SESSION SUMMARY
Course Elapsed Days: 10
Plan Fractions Treated to Date: 8
Plan Fractions Treated to Date: 9
Plan Prescribed Dose Per Fraction: 3 Gy
Plan Prescribed Dose Per Fraction: 3 Gy
Plan Total Fractions Prescribed: 10
Plan Total Fractions Prescribed: 9
Plan Total Prescribed Dose: 27 Gy
Plan Total Prescribed Dose: 30 Gy
Reference Point Dosage Given to Date: 27 Gy
Reference Point Dosage Given to Date: 27 Gy
Reference Point Session Dosage Given: 3 Gy
Reference Point Session Dosage Given: 3 Gy
Session Number: 11

## 2022-12-22 NOTE — Patient Instructions (Addendum)
Medication Instructions:  Your physician has recommended you make the following change in your medication:  1.) stop amlodipine (Norvasc) 2.) stop benazepril-hydrochlorthiazide (LOTENSIN HCT)   *If you need a refill on your cardiac medications before your next appointment, please call your pharmacy*   Lab Work: NONE   Testing/Procedures: NONE   Follow-Up: At [facility], you and your health needs are our priority.  As part of our continuing mission to provide you with exceptional heart care, we have created designated Provider Care Teams.  These Care Teams include your primary Cardiologist (physician) and Advanced Practice Providers (APPs -  Physician Assistants and Nurse Practitioners) who all work together to provide you with the care you need, when you need it.   Your next appointment:   3 month(s)  Provider:   Advanced Practice Provider (NP or PA-C)

## 2022-12-23 ENCOUNTER — Telehealth: Payer: Self-pay

## 2022-12-23 ENCOUNTER — Other Ambulatory Visit: Payer: Self-pay

## 2022-12-23 ENCOUNTER — Ambulatory Visit
Admission: RE | Admit: 2022-12-23 | Discharge: 2022-12-23 | Disposition: A | Discharge status: Home / Self Care | Payer: Medicare Other | Source: Ambulatory Visit | Attending: Radiation Oncology | Admitting: Radiation Oncology

## 2022-12-23 DIAGNOSIS — Z5112 Encounter for antineoplastic immunotherapy: Secondary | ICD-10-CM | POA: Diagnosis not present

## 2022-12-23 LAB — RAD ONC ARIA SESSION SUMMARY
Course Elapsed Days: 11
Plan Fractions Treated to Date: 10
Plan Fractions Treated to Date: 9
Plan Prescribed Dose Per Fraction: 3 Gy
Plan Prescribed Dose Per Fraction: 3 Gy
Plan Total Fractions Prescribed: 10
Plan Total Fractions Prescribed: 9
Plan Total Prescribed Dose: 27 Gy
Plan Total Prescribed Dose: 30 Gy
Reference Point Dosage Given to Date: 30 Gy
Reference Point Dosage Given to Date: 30 Gy
Reference Point Session Dosage Given: 3 Gy
Reference Point Session Dosage Given: 3 Gy
Session Number: 12

## 2022-12-23 NOTE — Telephone Encounter (Signed)
This nurse attempted to reach patient related to some questions that he has.  This nurse was unable to reach patient advised that a return call will be made on the next business day.  No further concerns at this time.

## 2022-12-26 ENCOUNTER — Telehealth: Payer: Self-pay | Admitting: Medical Oncology

## 2022-12-26 ENCOUNTER — Ambulatory Visit: Payer: Medicare Other | Attending: Cardiovascular Disease | Admitting: Pharmacist

## 2022-12-26 ENCOUNTER — Ambulatory Visit: Payer: Medicare Other | Admitting: Internal Medicine

## 2022-12-26 ENCOUNTER — Ambulatory Visit: Payer: Medicare Other

## 2022-12-26 ENCOUNTER — Other Ambulatory Visit: Payer: Medicare Other

## 2022-12-26 DIAGNOSIS — Z7901 Long term (current) use of anticoagulants: Secondary | ICD-10-CM | POA: Diagnosis not present

## 2022-12-26 DIAGNOSIS — I6349 Cerebral infarction due to embolism of other cerebral artery: Secondary | ICD-10-CM

## 2022-12-26 DIAGNOSIS — I4819 Other persistent atrial fibrillation: Secondary | ICD-10-CM

## 2022-12-26 DIAGNOSIS — Z79899 Other long term (current) drug therapy: Secondary | ICD-10-CM

## 2022-12-26 LAB — POCT INR: INR: 1.9 — AB (ref 2.0–3.0)

## 2022-12-26 NOTE — Telephone Encounter (Signed)
I told wife to bring pt decadron to his visit tomorrow.

## 2022-12-26 NOTE — Patient Instructions (Addendum)
Description   Skip your serving of greens tomorrow and continue taking warfarin 1/2 tablet daily except for 1 tablet on Sunday, Tuesday, and Thursday.  Continue eating broccoli only every 2-3 days per week.  Recheck INR 2 weeks  Anticoagulation Clinic-[phone].

## 2022-12-27 ENCOUNTER — Telehealth: Payer: Self-pay | Admitting: Medical Oncology

## 2022-12-27 ENCOUNTER — Encounter: Payer: Self-pay | Admitting: Medical Oncology

## 2022-12-27 ENCOUNTER — Inpatient Hospital Stay (HOSPITAL_BASED_OUTPATIENT_CLINIC_OR_DEPARTMENT_OTHER): Payer: Medicare Other | Admitting: Internal Medicine

## 2022-12-27 ENCOUNTER — Inpatient Hospital Stay: Payer: Medicare Other

## 2022-12-27 ENCOUNTER — Encounter: Payer: Self-pay | Admitting: Internal Medicine

## 2022-12-27 ENCOUNTER — Other Ambulatory Visit: Payer: Self-pay | Admitting: Medical Oncology

## 2022-12-27 VITALS — BP 128/99 | HR 103 | Temp 97.3°F | Resp 16 | Wt 191.3 lb

## 2022-12-27 VITALS — BP 101/72 | HR 96 | Resp 16

## 2022-12-27 DIAGNOSIS — C9000 Multiple myeloma not having achieved remission: Secondary | ICD-10-CM

## 2022-12-27 DIAGNOSIS — Z5112 Encounter for antineoplastic immunotherapy: Secondary | ICD-10-CM | POA: Diagnosis not present

## 2022-12-27 LAB — CBC WITH DIFFERENTIAL (CANCER CENTER ONLY)
Abs Immature Granulocytes: 0.04 10*3/uL (ref 0.00–0.07)
Basophils Absolute: 0.1 10*3/uL (ref 0.0–0.1)
Basophils Relative: 2 %
Eosinophils Absolute: 0.1 10*3/uL (ref 0.0–0.5)
Eosinophils Relative: 1 %
HCT: 40.6 % (ref 39.0–52.0)
Hemoglobin: 14 g/dL (ref 13.0–17.0)
Immature Granulocytes: 1 %
Lymphocytes Relative: 8 %
Lymphs Abs: 0.4 10*3/uL — ABNORMAL LOW (ref 0.7–4.0)
MCH: 34 pg (ref 26.0–34.0)
MCHC: 34.5 g/dL (ref 30.0–36.0)
MCV: 98.5 fL (ref 80.0–100.0)
Monocytes Absolute: 1.1 10*3/uL — ABNORMAL HIGH (ref 0.1–1.0)
Monocytes Relative: 21 %
Neutro Abs: 3.5 10*3/uL (ref 1.7–7.7)
Neutrophils Relative %: 67 %
Platelet Count: 162 10*3/uL (ref 150–400)
RBC: 4.12 MIL/uL — ABNORMAL LOW (ref 4.22–5.81)
RDW: 13.3 % (ref 11.5–15.5)
WBC Count: 5.1 10*3/uL (ref 4.0–10.5)
nRBC: 0 % (ref 0.0–0.2)

## 2022-12-27 LAB — CMP (CANCER CENTER ONLY)
ALT: 21 U/L (ref 0–44)
AST: 16 U/L (ref 15–41)
Albumin: 3.5 g/dL (ref 3.5–5.0)
Alkaline Phosphatase: 71 U/L (ref 38–126)
Anion gap: 5 (ref 5–15)
BUN: 16 mg/dL (ref 8–23)
CO2: 27 mmol/L (ref 22–32)
Calcium: 8.6 mg/dL — ABNORMAL LOW (ref 8.9–10.3)
Chloride: 105 mmol/L (ref 98–111)
Creatinine: 1 mg/dL (ref 0.61–1.24)
GFR, Estimated: >60 mL/min (ref >60)
Glucose, Bld: 84 mg/dL (ref 70–99)
Potassium: 4.7 mmol/L (ref 3.5–5.1)
Sodium: 137 mmol/L (ref 135–145)
Total Bilirubin: 1.2 mg/dL (ref 0.3–1.2)
Total Protein: 5.8 g/dL — ABNORMAL LOW (ref 6.5–8.1)

## 2022-12-27 MED ORDER — DARATUMUMAB-HYALURONIDASE-FIHJ 1800-30000 MG-UT/15ML ~~LOC~~ SOLN
1800.0000 mg | Freq: Once | SUBCUTANEOUS | Status: AC
  Administered 2022-12-27: 1800 mg via SUBCUTANEOUS
  Filled 2022-12-27: qty 15

## 2022-12-27 MED ORDER — BORTEZOMIB CHEMO SQ INJECTION 3.5 MG (2.5MG/ML)
1.3000 mg/m2 | Freq: Once | INTRAMUSCULAR | Status: AC
  Administered 2022-12-27: 2.75 mg via SUBCUTANEOUS
  Filled 2022-12-27: qty 1.1

## 2022-12-27 MED ORDER — ACETAMINOPHEN 325 MG PO TABS
650.0000 mg | ORAL_TABLET | Freq: Once | ORAL | Status: AC
  Administered 2022-12-27: 650 mg via ORAL
  Filled 2022-12-27: qty 2

## 2022-12-27 MED ORDER — DIPHENHYDRAMINE HCL 25 MG PO CAPS
50.0000 mg | ORAL_CAPSULE | Freq: Once | ORAL | Status: AC
  Administered 2022-12-27: 50 mg via ORAL
  Filled 2022-12-27: qty 2

## 2022-12-27 MED ORDER — LENALIDOMIDE 15 MG PO CAPS
15.0000 mg | ORAL_CAPSULE | Freq: Every day | ORAL | 3 refills | Status: DC
Start: 2022-12-27 — End: 2022-12-27

## 2022-12-27 MED ORDER — LENALIDOMIDE 15 MG PO CAPS
15.0000 mg | ORAL_CAPSULE | Freq: Every day | ORAL | 3 refills | Status: DC
Start: 2022-12-27 — End: 2022-12-28

## 2022-12-27 NOTE — Progress Notes (Signed)
New rx sent to [facility].

## 2022-12-27 NOTE — Progress Notes (Signed)
Patient seen by Dr. Gypsy Balsam are not all within treatment parameters. Heart rate= 103.  Labs reviewed: and are within treatment parameters.  Per physician team, patient is ready for treatment and there are NO modifications to the treatment plan.   Per Dr. Arbutus Ped ,it is ok to treat pt today with Bortezumab and Daratumumab -Hyaluronidase and heart rate of 103.

## 2022-12-27 NOTE — Telephone Encounter (Signed)
ILVM to [facility] to cancel rx for revlimid 25 mg so I can obtain a new auth number for his reduced dose of revlimid.

## 2022-12-27 NOTE — Progress Notes (Signed)
[facility] [phone]   [phone]  OFFICE PROGRESS NOTE  [name], MD [address] 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 Kathrynn Running.  CURRENT THERAPY: Systemic chemotherapy with daratumumab subcutaneously weekly in addition to Velcade 1.3 Mg/M2 on days 1, 4, 8, 11 every 3 weeks as well as Revlimid 25 mg p.o. daily for 14 days every 3 weeks and Decadron 20 mg p.o. weekly with chemotherapy.  First cycle expected on 12/05/2022.  Status post 1 cycle.  Starting from cycle #2 I will reduce the dose of Revlimid to 15 mg p.o. daily for 14 days every 3 weeks.  INTERVAL HISTORY: Ronald Duran 82 y.o. male returns to the clinic today for follow-up visit accompanied by his wife.  The patient is feeling fine today with no concerning complaints except for mild swelling in the left lower extremity and dry skin.  He completed the SBRT to the lung nodule and tolerated it fairly well.  He denied having any current chest pain, shortness of breath, cough or hemoptysis.  He has no nausea, vomiting, diarrhea or constipation.  He developed petechial rash in the lower abdomen at the site of the Velcade injection and he was found at that time to have chemotherapy-induced thrombocytopenia.  We did hold his treatment for last week.  He is here for evaluation before starting day 1 of cycle #2.   MEDICAL HISTORY: Past Medical History:  Diagnosis Date   Atrial fibrillation (HCC)    CAD (coronary artery disease)    a. s/p MI with LAD stent placement December, 1999 -last stress test November 2008 with no EKG evidence of ischemia  b. cath 07/23/2015 after abnormal ETT and nuc, occluded  mid LAD stent with good distal collateral, medical therapy    Cardiomyopathy (HCC)    Ischemic cardiomyopathy with EF 40% by echo 2005   COVID 2021   mild case   Dyslipidemia    Erectile dysfunction    GERD (gastroesophageal reflux disease)    Hiatal hernia    with esophageal strictures and dilatations in the past - Dr. [name]   History of nuclear stress test    Myoview 12/16: EF 47%, anterior, anteroseptal and inferoseptal defect suggesting infarct with peri-infarct ischemia, intermediate risk   HTN (hypertension)    Hx of seasonal allergies    Hyperlipidemia    Myocardial infarction (HCC) 08/01/1998   PAD (peripheral artery disease) (HCC)    Pre-diabetes    Prostate cancer (HCC)    with seed implant therapy following Lupron therapy in 2008 and 2009 - followed by Dr. Isabel Caprice   Ringworm    Stroke (HCC) 04/20/2017   seen on a MRI   Vitamin D deficiency     ALLERGIES:  has No Known Allergies.  MEDICATIONS:  Current Outpatient Medications  Medication Sig Dispense Refill   acyclovir (ZOVIRAX) 200 MG capsule Take 1 capsule (200 mg total) by mouth 2 (two) times daily. 60 capsule 5   atorvastatin (LIPITOR) 40 MG tablet Take 40 mg by mouth at bedtime.     B Complex Vitamins (B COMPLEX 100) tablet Take 1 tablet by mouth daily. Balance  cetirizine (ZYRTEC) 10 MG tablet Take 10 mg by mouth daily.     Cholecalciferol (VITAMIN D3) 2000 UNITS TABS Take 2,000 Units by mouth daily.     Coenzyme Q10 (CO Q-10) 100 MG CAPS Take 100 mg by mouth daily with supper.     dexamethasone (DECADRON) 4 MG tablet 5 tab weekly start with the first dose of chemotherapy. 40 tablet 3   lenalidomide (REVLIMID) 25 MG capsule Take 1 capsule (25 mg total) by mouth daily. Take for 14 days, then none for 7 days. Repeat every 21 days. Start Revlimid on day 1 of chemotherapy Auth #[id number] date obtained 12/19/2022 Adult Male. 14 capsule 0   metoprolol succinate (TOPROL XL) 25 MG 24 hr tablet Take 1 tablet (25  mg total) by mouth daily. 90 tablet 3   Multiple Vitamins-Minerals (CENTRUM) tablet Take 1 tablet by mouth daily.     nitroGLYCERIN (NITROSTAT) 0.4 MG SL tablet PLACE 1 TABLET UNDER THE TONGUE IF NEEDED EVERY 5 MINUTES FOR CHEST PAIN FOR 3 DOSES IF NO RELIEF AFTER FIRST DOSE CALL 911. 25 tablet 9   omeprazole (PRILOSEC) 20 MG capsule Take 20 mg by mouth daily before breakfast.     prochlorperazine (COMPAZINE) 10 MG tablet Take 1 tablet (10 mg total) by mouth every 6 (six) hours as needed for nausea or vomiting. 30 tablet 1   triamcinolone ointment (KENALOG) 0.5 % Apply 1 Application topically 2 (two) times daily. 30 g 0   warfarin (COUMADIN) 5 MG tablet TAKE 1/2 TO 1 TABLET BY MOUTH DAILY AS DIRECTED BY COUMADIN CLINIC (Patient taking differently: Take 2.5-5 mg by mouth See admin instructions. Take 2.5 mg by mouth on Monday, Wednesday and Friday and take 5 mg on Tuesday, Thursday, Saturday and Sunday) 100 tablet 0   No current facility-administered medications for this visit.    SURGICAL HISTORY:  Past Surgical History:  Procedure Laterality Date   BRONCHIAL BIOPSY  11/15/2022   Procedure: BRONCHIAL BIOPSIES;  Surgeon: [name], DO;  Location: [facility];  Service: Pulmonary;;   BRONCHIAL BRUSHINGS  11/15/2022   Procedure: BRONCHIAL BRUSHINGS;  Surgeon: [name], DO;  Location: [facility];  Service: Pulmonary;;   BRONCHIAL NEEDLE ASPIRATION BIOPSY  11/15/2022   Procedure: BRONCHIAL NEEDLE ASPIRATION BIOPSIES;  Surgeon: [name], DO;  Location: [facility];  Service: Pulmonary;;   CARDIAC CATHETERIZATION N/A 07/23/2015   Procedure: Left Heart Cath and Coronary Angiography;  Surgeon: [name], MD;  Location: [facility] INVASIVE CV LAB;  Service: Cardiovascular;  Laterality: N/A;   CARDIOVERSION N/A 06/03/2022   Procedure: CARDIOVERSION;  Surgeon: [name], MD;  Location: [facility] ENDOSCOPY;  Service: Cardiovascular;  Laterality: N/A;   COLONOSCOPY WITH PROPOFOL N/A 11/24/2015    Procedure: COLONOSCOPY WITH PROPOFOL;  Surgeon: [name], MD;  Location: [facility] ENDOSCOPY;  Service: Endoscopy;  Laterality: N/A;   esophageal strictures with dilatations in the past per Dr. [name]     last '98   FIDUCIAL MARKER PLACEMENT  11/15/2022   Procedure: FIDUCIAL MARKER PLACEMENT;  Surgeon: [name], DO;  Location: [facility]OS[facility];  Service: Pulmonary;;   HERNIA REPAIR Left    INTRAMEDULLARY (IM) NAIL INTERTROCHANTERIC Left 10/25/2022   Procedure: LEFT INTRAMEDULLARY (IM) NAIL INTERTROCHANTERIC;  Surgeon: [name], MD;  Location: [facility] OR;  Service: Orthopedics;  Laterality: Left;   IR BONE MARROW BIOPSY & ASPIRATION  11/17/2022   IR BONE TUMOR(S)RF ABLATION  10/11/2022   IR KYPHO THORACIC WITH BONE BIOPSY  10/11/2022   LAD stent placement - Dr. [name]     left inguinal hernia repair - Dr. [name]     prostatic radioactive seed implantation - Dr. [name]     '09    REVIEW OF SYSTEMS:  A comprehensive review of systems was negative except for: Constitutional: positive for fatigue Integument/breast: positive for dryness   PHYSICAL EXAMINATION: General appearance: alert, cooperative, fatigued, and no distress Head: Normocephalic, without obvious abnormality, atraumatic Neck: no adenopathy, no JVD, supple, symmetrical, trachea midline, and thyroid not enlarged, symmetric, no tenderness/mass/nodules Lymph nodes: Cervical, supraclavicular, and axillary nodes normal. Resp: clear to auscultation bilaterally Back: symmetric, no curvature. ROM normal. No CVA tenderness. Cardio: regular rate and rhythm, S1, S2 normal, no murmur, click, rub or gallop GI: soft, non-tender; bowel sounds normal; no masses,  no organomegaly Extremities: extremities normal, atraumatic, no cyanosis or edema  ECOG PERFORMANCE STATUS: 1 - Symptomatic but completely ambulatory  Blood pressure (!) 128/99, pulse (!) 103, temperature (!) 97.3 F (36.3 C), temperature source Temporal, resp. rate  16, weight 191 lb 4.8 oz (86.8 kg), SpO2 98 %.  LABORATORY DATA: Lab Results  Component Value Date   WBC 5.1 12/27/2022   HGB 14.0 12/27/2022   HCT 40.6 12/27/2022   MCV 98.5 12/27/2022   PLT 162 12/27/2022      Chemistry      Component Value Date/Time   NA 136 12/13/2022 0835   NA 141 08/25/2022 1440   K 3.8 12/13/2022 0835   CL 102 12/13/2022 0835   CO2 27 12/13/2022 0835   BUN 18 12/13/2022 0835   BUN 14 08/25/2022 1440   CREATININE 1.06 12/13/2022 0835   CREATININE 1.23 (H) 07/21/2015 0917      Component Value Date/Time   CALCIUM 9.1 12/13/2022 0835   ALKPHOS 73 12/13/2022 0835   AST 24 12/13/2022 0835   ALT 36 12/13/2022 0835   BILITOT 1.1 12/13/2022 0835       RADIOGRAPHIC STUDIES: XR FEMUR MIN 2 VIEWS LEFT  Result Date: 12/06/2022 Strength stable alignment of the hardware without complication   ASSESSMENT AND PLAN: This is a very pleasant 82 years old white male with multiple myeloma presented with multiple lytic lesions involving T10, left femoral neck as well as T4, T9 and T11 vertebral bodies. Also had right lower lobe lung nodule that was suspicious for non-small cell lung cancer. 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 The biopsy from the left hip was negative for malignancy. His myeloma panel showed no concerning findings except for elevated IgG but the serum light chains were normal. The patient underwent left hip fixation for impending fracture. He also underwent palliative radiotherapy under the care of Dr. Kathrynn Running. His recent bone marrow biopsy and aspirate was consistent with plasma cell neoplasm with involvement of plasma cells 25% of the bone marrow. The patient is currently on treatment with daratumumab, Velcade, Revlimid and dexamethasone started on 12/05/2022 status post 1 cycle. He tolerated the first cycle of his treatment  fairly well with no concerning adverse effect except for chemotherapy-induced thrombocytopenia.  We did hold his treatment for last week because of the thrombocytopenia and rash that developed in the lower part of the abdomen. I recommended for the patient to proceed with day 1 of cycle #2 today but with the reduced dose of Revlimid 15 mg p.o. daily for 14 days every 3 weeks.  I will see him back for follow-up visit in 3 weeks for evaluation before the next cycle of his treatment. For the suspicious right lower lobe lung cancer, I the patient is expected to undergo SBRT under the care of Dr. Kathrynn Running. For anticoagulation, he is currently on Coumadin. He was advised to call immediately if he has any concerning symptoms in the interval. The patient voices understanding of current disease status and treatment options and is in agreement with the current care plan.  All questions were answered. The patient knows to call the clinic with any problems, questions or concerns. We can certainly see the patient much sooner if necessary.  The total time spent in the appointment was 20 minutes.  Disclaimer: This note was dictated with voice recognition software. Similar sounding words can inadvertently be transcribed and may not be corrected upon review.

## 2022-12-27 NOTE — Telephone Encounter (Signed)
New prescription dose called to Revlim[facility]. I was told to use the same auth number and the date of 12/19/22. RX sent to [facility].

## 2022-12-27 NOTE — Progress Notes (Signed)
Pt states that he took his decadron at 1050.  1210- immediately after darzalex faspro injection, skin around injection site appeared red. No other symptoms noted, including no swelling, warmth, pain. Pt observed for 45 min. Redness noted to have improved significantly. Pt directed to call clinic with any other issues.

## 2022-12-28 ENCOUNTER — Telehealth: Payer: Self-pay | Admitting: Medical Oncology

## 2022-12-28 MED ORDER — LENALIDOMIDE 15 MG PO CAPS
15.0000 mg | ORAL_CAPSULE | Freq: Every day | ORAL | 0 refills | Status: DC
Start: 2022-12-28 — End: 2023-06-16

## 2022-12-28 NOTE — Telephone Encounter (Signed)
Requested [facility] send Revlimid rx ASAP.

## 2022-12-28 NOTE — Addendum Note (Signed)
Addended by: Charma Igo on: 12/28/2022 08:22 AM   Modules accepted: Orders

## 2022-12-29 ENCOUNTER — Ambulatory Visit: Payer: Medicare Other

## 2022-12-30 ENCOUNTER — Inpatient Hospital Stay: Payer: Medicare Other

## 2022-12-30 VITALS — BP 108/72 | HR 100 | Temp 98.2°F | Resp 18

## 2022-12-30 DIAGNOSIS — Z5112 Encounter for antineoplastic immunotherapy: Secondary | ICD-10-CM | POA: Diagnosis not present

## 2022-12-30 DIAGNOSIS — C9000 Multiple myeloma not having achieved remission: Secondary | ICD-10-CM

## 2022-12-30 MED ORDER — BORTEZOMIB CHEMO SQ INJECTION 3.5 MG (2.5MG/ML)
1.3000 mg/m2 | Freq: Once | INTRAMUSCULAR | Status: AC
  Administered 2022-12-30: 2.75 mg via SUBCUTANEOUS
  Filled 2022-12-30: qty 1.1

## 2022-12-30 MED ORDER — PROCHLORPERAZINE MALEATE 10 MG PO TABS
10.0000 mg | ORAL_TABLET | Freq: Once | ORAL | Status: AC
  Administered 2022-12-30: 10 mg via ORAL
  Filled 2022-12-30: qty 1

## 2023-01-03 ENCOUNTER — Ambulatory Visit: Payer: Medicare Other | Admitting: Physician Assistant

## 2023-01-03 ENCOUNTER — Inpatient Hospital Stay: Payer: Medicare Other

## 2023-01-03 DIAGNOSIS — C9000 Multiple myeloma not having achieved remission: Secondary | ICD-10-CM

## 2023-01-03 DIAGNOSIS — Z5112 Encounter for antineoplastic immunotherapy: Secondary | ICD-10-CM | POA: Diagnosis not present

## 2023-01-03 LAB — CMP (CANCER CENTER ONLY)
ALT: 18 U/L (ref 0–44)
AST: 16 U/L (ref 15–41)
Albumin: 3.5 g/dL (ref 3.5–5.0)
Alkaline Phosphatase: 55 U/L (ref 38–126)
Anion gap: 5 (ref 5–15)
BUN: 13 mg/dL (ref 8–23)
CO2: 27 mmol/L (ref 22–32)
Calcium: 8.6 mg/dL — ABNORMAL LOW (ref 8.9–10.3)
Chloride: 105 mmol/L (ref 98–111)
Creatinine: 1.07 mg/dL (ref 0.61–1.24)
GFR, Estimated: >60 mL/min (ref >60)
Glucose, Bld: 105 mg/dL — ABNORMAL HIGH (ref 70–99)
Potassium: 3.7 mmol/L (ref 3.5–5.1)
Sodium: 137 mmol/L (ref 135–145)
Total Bilirubin: 1 mg/dL (ref 0.3–1.2)
Total Protein: 5.5 g/dL — ABNORMAL LOW (ref 6.5–8.1)

## 2023-01-03 LAB — CBC WITH DIFFERENTIAL (CANCER CENTER ONLY)
Abs Immature Granulocytes: 0.21 10*3/uL — ABNORMAL HIGH (ref 0.00–0.07)
Basophils Absolute: 0.1 10*3/uL (ref 0.0–0.1)
Basophils Relative: 2 %
Eosinophils Absolute: 0.1 10*3/uL (ref 0.0–0.5)
Eosinophils Relative: 4 %
HCT: 38.7 % — ABNORMAL LOW (ref 39.0–52.0)
Hemoglobin: 13.5 g/dL (ref 13.0–17.0)
Immature Granulocytes: 6 %
Lymphocytes Relative: 8 %
Lymphs Abs: 0.3 10*3/uL — ABNORMAL LOW (ref 0.7–4.0)
MCH: 33.9 pg (ref 26.0–34.0)
MCHC: 34.9 g/dL (ref 30.0–36.0)
MCV: 97.2 fL (ref 80.0–100.0)
Monocytes Absolute: 0.2 10*3/uL (ref 0.1–1.0)
Monocytes Relative: 4 %
Neutro Abs: 2.9 10*3/uL (ref 1.7–7.7)
Neutrophils Relative %: 76 %
Platelet Count: 64 10*3/uL — ABNORMAL LOW (ref 150–400)
RBC: 3.98 MIL/uL — ABNORMAL LOW (ref 4.22–5.81)
RDW: 13.9 % (ref 11.5–15.5)
WBC Count: 3.7 10*3/uL — ABNORMAL LOW (ref 4.0–10.5)
nRBC: 0 % (ref 0.0–0.2)

## 2023-01-03 NOTE — Progress Notes (Addendum)
Per Dr Arbutus Ped, hold Darzalex Faspro/Velcade/Revlimid this week due to plt 64 K/uL, pt aware and agrees. Jae Dire, PA to chairside to assess L foot swelling and redness. [name], RN contacted to verify pt schedule reflects changes.

## 2023-01-04 ENCOUNTER — Ambulatory Visit: Payer: Medicare Other | Attending: Cardiology

## 2023-01-04 ENCOUNTER — Inpatient Hospital Stay: Payer: Medicare Other | Admitting: Dietician

## 2023-01-04 DIAGNOSIS — Z79899 Other long term (current) drug therapy: Secondary | ICD-10-CM

## 2023-01-04 DIAGNOSIS — I4819 Other persistent atrial fibrillation: Secondary | ICD-10-CM | POA: Diagnosis not present

## 2023-01-04 DIAGNOSIS — I6349 Cerebral infarction due to embolism of other cerebral artery: Secondary | ICD-10-CM | POA: Diagnosis not present

## 2023-01-04 LAB — POCT INR: INR: 1.6 — AB (ref 2.0–3.0)

## 2023-01-04 NOTE — Progress Notes (Signed)
Nutrition Assessment   Reason for Assessment: Pt request   ASSESSMENT: 82 year old male with multiple myeloma and  right lower lobe pulmonary nodule suspicious for non-small cell lung cancer. S/p SBRT under the care of Dr. Kathrynn Running. Patient is currently receiving chemotherapy with daratumumab + velcade (first 4/29) Patient is under the care of Dr. Arbutus Ped.   Past medical history reviewed   Met with patient and wife in office. Patient has brought the book received from radiation while undergoing treatment. He has questions regarding some of the foods mentioned not to eat that are listed in the book. Wife asking if tomatoes, squash, lettuce, blueberries are okay to eat. Patient reports he has a good appetite. He eats several small meals daily. Recalls oatmeal with prunes for breakfast, grilled cheese, potato salad for lunch, cottage cheese, pears and 2 slices of [place] for dinner. Patient had 2 pudding cups for dessert. Patient reports choosing softer foods for ease of intake as radiation esophagitis was discussed as a possible side effect of radiation. Patient completed radiation 5/17. He denies nutrition impact symptoms.   Nutrition Focused Physical Exam: deferred    Medications: reviewed    Labs: reviewed   Anthropometrics:   Height: 6' Weight: 192 lb 8 oz  UBW: 190-194 lb  BMI: 26.11   NUTRITION DIAGNOSIS: Food and nutrition related knowledge deficit related to cancer and associated treatment as evidenced by no prior need for related nutrition information    INTERVENTION:  Encouraged small frequent meals and snacks with adequate calories and protein, offered suggestions - handout with ideas provided Discussed foods with protein, encouraged good sources of protein with every meal All questions answered  Contact information provided    MONITORING, EVALUATION, GOAL: Patient will tolerate adequate calories and protein to minimize weight loss during treatment   Next Visit: No  follow-up scheduled. Pt will contact with nutrition questions/concerns

## 2023-01-04 NOTE — Patient Instructions (Signed)
Description   Take 1 tablet today and then START taking 1 tablet daily EXCEPT 0.5 Mondays, Wednesdays, and Fridays.  Continue eating broccoli only every 2-3 days per week.  Recheck INR 2 weeks  Anticoagulation Clinic-[phone].

## 2023-01-05 ENCOUNTER — Ambulatory Visit: Payer: Medicare Other

## 2023-01-06 ENCOUNTER — Ambulatory Visit: Payer: Medicare Other

## 2023-01-07 NOTE — Progress Notes (Unsigned)
[facility] OFFICE PROGRESS NOTE  [name], MD [address] 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 Kathrynn Running.   CURRENT THERAPY: Systemic chemotherapy with daratumumab subcutaneously weekly in addition to Velcade 1.3 Mg/M2 on days 1, 4, 8, 11 every 3 weeks as well as Revlimid 25 mg p.o. daily for 14 days every 3 weeks and Decadron 20 mg p.o. weekly with chemotherapy.  First cycle expected on 12/05/2022.  Status post 1 cycle.  Starting from cycle #2 Dr. Arbutus Ped will reduce the dose of Revlimid to 15 mg p.o. daily for 14 days every 3 weeks. Starting from today ***, Dr. Arbutus Ped will ***. The patient has had dose delays secondary to thrombocytopenia  INTERVAL HISTORY: Ronald Duran 83 y.o. male returns to the clinic today for follow-up visit accompanied by his wife. He is currently being treated for multiple myeloma. However, he has been struggling with thrombocytopenia and had his velcade and darratumumab held. His revlimid also has been held. He has not taken revlimid for ***. His dose was also ***.   He had a petechial rash on his abdomen near the velcade rashes which has *** at this time. He denies any other bleeding. Denies signs of infection.    Denies any fever, chills, night sweats, or weight loss. Denies any chest pain, shortness of breath, cough, or hemoptysis. Denies any nausea, vomiting, diarrhea, or constipation. Denies any headache or visual changes. Foot swelling ***? The patient is here today for evaluation prior to starting cycle # 2 day 8.    MEDICAL HISTORY: Past Medical History:  Diagnosis Date   Atrial fibrillation (HCC)    CAD (coronary artery disease)    a. s/p  MI with LAD stent placement December, 1999 -last stress test November 2008 with no EKG evidence of ischemia  b. cath 07/23/2015 after abnormal ETT and nuc, occluded mid LAD stent with good distal collateral, medical therapy    Cardiomyopathy (HCC)    Ischemic cardiomyopathy with EF 40% by echo 2005   COVID 2021   mild case   Dyslipidemia    Erectile dysfunction    GERD (gastroesophageal reflux disease)    Hiatal hernia    with esophageal strictures and dilatations in the past - Dr. [name]   History of nuclear stress test    Myoview 12/16: EF 47%, anterior, anteroseptal and inferoseptal defect suggesting infarct with peri-infarct ischemia, intermediate risk   HTN (hypertension)    Hx of seasonal allergies    Hyperlipidemia    Myocardial infarction (HCC) 08/01/1998   PAD (peripheral artery disease) (HCC)    Pre-diabetes    Prostate cancer (HCC)    with seed implant therapy following Lupron therapy in 2008 and 2009 - followed by Dr. Isabel Caprice   Ringworm    Stroke (HCC) 04/20/2017   seen on a MRI   Vitamin D deficiency     ALLERGIES:  has No Known Allergies.  MEDICATIONS:  Current Outpatient Medications  Medication Sig Dispense Refill   acyclovir (ZOVIRAX) 200 MG capsule Take 1 capsule (200 mg total) by mouth 2 (two) times daily. 60 capsule 5   atorvastatin (LIPITOR) 40 MG tablet Take 40 mg by mouth at bedtime.  B Complex Vitamins (B COMPLEX 100) tablet Take 1 tablet by mouth daily. Balance     cetirizine (ZYRTEC) 10 MG tablet Take 10 mg by mouth daily.     Cholecalciferol (VITAMIN D3) 2000 UNITS TABS Take 2,000 Units by mouth daily.     Coenzyme Q10 (CO Q-10) 100 MG CAPS Take 100 mg by mouth daily with supper.     dexamethasone (DECADRON) 4 MG tablet 5 tab weekly start with the first dose of chemotherapy. 40 tablet 3   lenalidomide (REVLIMID) 15 MG capsule Take 1 capsule (15 mg total) by mouth daily. Take for 14 days, then none for 7 days. Repeat every 21 days. Start Revlimid  on day 1 of chemotherapy Auth #=[id number] date obtained 12/19/2022 Adult Male. 14 capsule 0   metoprolol succinate (TOPROL XL) 25 MG 24 hr tablet Take 1 tablet (25 mg total) by mouth daily. 90 tablet 3   Multiple Vitamins-Minerals (CENTRUM) tablet Take 1 tablet by mouth daily.     nitroGLYCERIN (NITROSTAT) 0.4 MG SL tablet PLACE 1 TABLET UNDER THE TONGUE IF NEEDED EVERY 5 MINUTES FOR CHEST PAIN FOR 3 DOSES IF NO RELIEF AFTER FIRST DOSE CALL 911. 25 tablet 9   omeprazole (PRILOSEC) 20 MG capsule Take 20 mg by mouth daily before breakfast.     prochlorperazine (COMPAZINE) 10 MG tablet Take 1 tablet (10 mg total) by mouth every 6 (six) hours as needed for nausea or vomiting. 30 tablet 1   triamcinolone ointment (KENALOG) 0.5 % Apply 1 Application topically 2 (two) times daily. 30 g 0   warfarin (COUMADIN) 5 MG tablet TAKE 1/2 TO 1 TABLET BY MOUTH DAILY AS DIRECTED BY COUMADIN CLINIC (Patient taking differently: Take 2.5-5 mg by mouth See admin instructions. Take 2.5 mg by mouth on Monday, Wednesday and Friday and take 5 mg on Tuesday, Thursday, Saturday and Sunday) 100 tablet 0   No current facility-administered medications for this visit.    SURGICAL HISTORY:  Past Surgical History:  Procedure Laterality Date   BRONCHIAL BIOPSY  11/15/2022   Procedure: BRONCHIAL BIOPSIES;  Surgeon: [name], DO;  Location: [facility] ENDOSCOPY;  Service: Pulmonary;;   BRONCHIAL BRUSHINGS  11/15/2022   Procedure: BRONCHIAL BRUSHINGS;  Surgeon: [name], DO;  Location: [facility];  Service: Pulmonary;;   BRONCHIAL NEEDLE ASPIRATION BIOPSY  11/15/2022   Procedure: BRONCHIAL NEEDLE ASPIRATION BIOPSIES;  Surgeon: [name], DO;  Location: [facility] ENDOSCOPY;  Service: Pulmonary;;   CARDIAC CATHETERIZATION N/A 07/23/2015   Procedure: Left Heart Cath and Coronary Angiography;  Surgeon: [name], MD;  Location: [facility] INVASIVE CV LAB;  Service: Cardiovascular;  Laterality: N/A;   CARDIOVERSION N/A 06/03/2022    Procedure: CARDIOVERSION;  Surgeon: [name], MD;  Location: [facility] ENDOSCOPY;  Service: Cardiovascular;  Laterality: N/A;   COLONOSCOPY WITH PROPOFOL N/A 11/24/2015   Procedure: COLONOSCOPY WITH PROPOFOL;  Surgeon: [name], MD;  Location: [facility] ENDOSCOPY;  Service: Endoscopy;  Laterality: N/A;   esophageal strictures with dilatations in the past per Dr. [name]     last '98   FIDUCIAL MARKER PLACEMENT  11/15/2022   Procedure: FIDUCIAL MARKER PLACEMENT;  Surgeon: [name], DO;  Location: [facility] ENDOSCOPY;  Service: Pulmonary;;   HERNIA REPAIR Left    INTRAMEDULLARY (IM) NAIL INTERTROCHANTERIC Left 10/25/2022   Procedure: LEFT INTRAMEDULLARY (IM) NAIL INTERTROCHANTERIC;  Surgeon: [name], MD;  Location: [facility] OR;  Service: Orthopedics;  Laterality: Left;   IR BONE MARROW BIOPSY & ASPIRATION  11/17/2022   IR BONE TUMOR(S)RF ABLATION  10/11/2022   IR KYPHO THORACIC WITH BONE BIOPSY  10/11/2022   LAD stent placement - Dr. [name]     left inguinal hernia repair - Dr. [name]     prostatic radioactive seed implantation - Dr. [name]     '09    REVIEW OF SYSTEMS:   Review of Systems  Constitutional: Negative for appetite change, chills, fatigue, fever and unexpected weight change.  HENT:   Negative for mouth sores, nosebleeds, sore throat and trouble swallowing.   Eyes: Negative for eye problems and icterus.  Respiratory: Negative for cough, hemoptysis, shortness of breath and wheezing.   Cardiovascular: Negative for chest pain and leg swelling.  Gastrointestinal: Negative for abdominal pain, constipation, diarrhea, nausea and vomiting.  Genitourinary: Negative for bladder incontinence, difficulty urinating, dysuria, frequency and hematuria.   Musculoskeletal: Negative for back pain, gait problem, neck pain and neck stiffness.  Skin: Negative for itching and rash.  Neurological: Negative for dizziness, extremity weakness, gait problem, headaches, light-headedness and  seizures.  Hematological: Negative for adenopathy. Does not bruise/bleed easily.  Psychiatric/Behavioral: Negative for confusion, depression and sleep disturbance. The patient is not nervous/anxious.     PHYSICAL EXAMINATION:  There were no vitals taken for this visit.  ECOG PERFORMANCE STATUS: {CHL ONC ECOG [id number]  Physical Exam  Constitutional: Oriented to person, place, and time and well-developed, well-nourished, and in no distress. No distress.  HENT:  Head: Normocephalic and atraumatic.  Mouth/Throat: Oropharynx is clear and moist. No oropharyngeal exudate.  Eyes: Conjunctivae are normal. Right eye exhibits no discharge. Left eye exhibits no discharge. No scleral icterus.  Neck: Normal range of motion. Neck supple.  Cardiovascular: Normal rate, regular rhythm, normal heart sounds and intact distal pulses.   Pulmonary/Chest: Effort normal and breath sounds normal. No respiratory distress. No wheezes. No rales.  Abdominal: Soft. Bowel sounds are normal. Exhibits no distension and no mass. There is no tenderness.  Musculoskeletal: Normal range of motion. Exhibits no edema.  Lymphadenopathy:    No cervical adenopathy.  Neurological: Alert and oriented to person, place, and time. Exhibits normal muscle tone. Gait normal. Coordination normal.  Skin: Skin is warm and dry. No rash noted. Not diaphoretic. No erythema. No pallor.  Psychiatric: Mood, memory and judgment normal.  Vitals reviewed.  LABORATORY DATA: Lab Results  Component Value Date   WBC 3.7 (L) 01/03/2023   HGB 13.5 01/03/2023   HCT 38.7 (L) 01/03/2023   MCV 97.2 01/03/2023   PLT 64 (L) 01/03/2023      Chemistry      Component Value Date/Time   NA 137 01/03/2023 0813   NA 141 08/25/2022 1440   K 3.7 01/03/2023 0813   CL 105 01/03/2023 0813   CO2 27 01/03/2023 0813   BUN 13 01/03/2023 0813   BUN 14 08/25/2022 1440   CREATININE 1.07 01/03/2023 0813   CREATININE 1.23 (H) 07/21/2015 0917       Component Value Date/Time   CALCIUM 8.6 (L) 01/03/2023 0813   ALKPHOS 55 01/03/2023 0813   AST 16 01/03/2023 0813   ALT 18 01/03/2023 0813   BILITOT 1.0 01/03/2023 0813       RADIOGRAPHIC STUDIES:  No results found.   ASSESSMENT/PLAN:  This is a very pleasant 82 year old Caucasian male with multiple myeloma presented with multiple lytic lesions involving T10, left femoral neck as well as T4, T9 and T11 vertebral bodies.   He also had right lower lobe  lung nodule that was suspicious for non-small cell lung cancer.   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   The biopsy from the left hip was negative for malignancy. His myeloma panel showed no concerning findings except for elevated IgG but the serum light chains were normal. The patient underwent left hip fixation for impending fracture. He also underwent palliative radiotherapy under the care of Dr. Kathrynn Running. His recent bone marrow biopsy and aspirate was consistent with plasma cell neoplasm with involvement of plasma cells 25% of the bone marrow.  The patient is currently on treatment with daratumumab, Velcade, Revlimid and dexamethasone started on 12/05/2022 status post 1 cycle.   He has been struggling with thrombocytopenia causing dose delays. His revlimid dose was reduced to 15 mg 2 weeks on 1 week off.   The patient was seen with Dr. Arbutus Ped today. Labs were reviewed. Recommend ***  ***Dr. Arbutus Ped recommends ***????? recommended for the patient to proceed with day 1 of cycle #2 today but with the reduced dose of Revlimid 15 mg p.o. daily for 14 days every 3 weeks. I will see him back for follow-up visit in 3 weeks for evaluation before the next cycle of his treatment.****  Dose reductions***  His platelet count is *** today?  The patient also completed SBRT to the suspicious right lower lobe lung cancer.    For anticoagulation, he is currently on Coumadin.   The patient was advised to call immediately if he has any concerning symptoms in the interval. The patient voices understanding of current disease status and treatment options and is in agreement with the current care plan. All questions were answered. The patient knows to call the clinic with any problems, questions or concerns. We can certainly see the patient much sooner if necessary         No orders of the defined types were placed in this encounter.    I spent {CHL ONC TIME VISIT - ZOXWR:[id number]} counseling the patient face to face. The total time spent in the appointment was {CHL ONC TIME VISIT - BJYNW:[id number]}.  Mattye Verdone L Shevy Yaney, PA-C 01/07/23

## 2023-01-09 ENCOUNTER — Encounter: Payer: Self-pay | Admitting: Internal Medicine

## 2023-01-09 ENCOUNTER — Other Ambulatory Visit: Payer: Medicare Other

## 2023-01-09 ENCOUNTER — Ambulatory Visit: Payer: Medicare Other | Admitting: Physician Assistant

## 2023-01-09 ENCOUNTER — Ambulatory Visit: Payer: Medicare Other

## 2023-01-10 ENCOUNTER — Inpatient Hospital Stay (HOSPITAL_BASED_OUTPATIENT_CLINIC_OR_DEPARTMENT_OTHER): Payer: Medicare Other | Admitting: Physician Assistant

## 2023-01-10 ENCOUNTER — Ambulatory Visit: Payer: Medicare Other | Admitting: Physician Assistant

## 2023-01-10 ENCOUNTER — Inpatient Hospital Stay: Payer: Medicare Other | Attending: Internal Medicine

## 2023-01-10 ENCOUNTER — Inpatient Hospital Stay: Payer: Medicare Other

## 2023-01-10 VITALS — BP 116/93 | HR 84 | Temp 98.1°F | Resp 18 | Wt 196.0 lb

## 2023-01-10 VITALS — BP 114/91 | HR 92 | Temp 97.7°F | Resp 16

## 2023-01-10 DIAGNOSIS — C3431 Malignant neoplasm of lower lobe, right bronchus or lung: Secondary | ICD-10-CM | POA: Diagnosis not present

## 2023-01-10 DIAGNOSIS — C9000 Multiple myeloma not having achieved remission: Secondary | ICD-10-CM | POA: Insufficient documentation

## 2023-01-10 DIAGNOSIS — R21 Rash and other nonspecific skin eruption: Secondary | ICD-10-CM | POA: Diagnosis not present

## 2023-01-10 DIAGNOSIS — Z5112 Encounter for antineoplastic immunotherapy: Secondary | ICD-10-CM | POA: Diagnosis present

## 2023-01-10 DIAGNOSIS — L304 Erythema intertrigo: Secondary | ICD-10-CM | POA: Diagnosis not present

## 2023-01-10 LAB — CBC WITH DIFFERENTIAL (CANCER CENTER ONLY)
Abs Immature Granulocytes: 0.04 10*3/uL (ref 0.00–0.07)
Basophils Absolute: 0.1 10*3/uL (ref 0.0–0.1)
Basophils Relative: 1 %
Eosinophils Absolute: 0.1 10*3/uL (ref 0.0–0.5)
Eosinophils Relative: 1 %
HCT: 40.8 % (ref 39.0–52.0)
Hemoglobin: 13.9 g/dL (ref 13.0–17.0)
Immature Granulocytes: 0 %
Lymphocytes Relative: 6 %
Lymphs Abs: 0.6 10*3/uL — ABNORMAL LOW (ref 0.7–4.0)
MCH: 33.9 pg (ref 26.0–34.0)
MCHC: 34.1 g/dL (ref 30.0–36.0)
MCV: 99.5 fL (ref 80.0–100.0)
Monocytes Absolute: 0.9 10*3/uL (ref 0.1–1.0)
Monocytes Relative: 10 %
Neutro Abs: 7.5 10*3/uL (ref 1.7–7.7)
Neutrophils Relative %: 82 %
Platelet Count: 107 10*3/uL — ABNORMAL LOW (ref 150–400)
RBC: 4.1 MIL/uL — ABNORMAL LOW (ref 4.22–5.81)
RDW: 14.7 % (ref 11.5–15.5)
WBC Count: 9.2 10*3/uL (ref 4.0–10.5)
nRBC: 0 % (ref 0.0–0.2)

## 2023-01-10 MED ORDER — ACETAMINOPHEN 325 MG PO TABS
650.0000 mg | ORAL_TABLET | Freq: Once | ORAL | Status: AC
  Administered 2023-01-10: 650 mg via ORAL
  Filled 2023-01-10: qty 2

## 2023-01-10 MED ORDER — DARATUMUMAB-HYALURONIDASE-FIHJ 1800-30000 MG-UT/15ML ~~LOC~~ SOLN
1800.0000 mg | Freq: Once | SUBCUTANEOUS | Status: AC
  Administered 2023-01-10: 1800 mg via SUBCUTANEOUS
  Filled 2023-01-10: qty 15

## 2023-01-10 MED ORDER — DIPHENHYDRAMINE HCL 25 MG PO CAPS
50.0000 mg | ORAL_CAPSULE | Freq: Once | ORAL | Status: AC
  Administered 2023-01-10: 50 mg via ORAL
  Filled 2023-01-10: qty 2

## 2023-01-10 MED ORDER — NYSTATIN 100000 UNIT/GM EX POWD
1.0000 | Freq: Three times a day (TID) | CUTANEOUS | 0 refills | Status: DC
Start: 2023-01-10 — End: 2023-02-03

## 2023-01-10 NOTE — Patient Instructions (Signed)
[facility]  Discharge Instructions: Thank you for choosing [facility] to provide your oncology and hematology care.   If you have a lab appointment with the Cancer Center, please go directly to the Cancer Center and check in at the registration area.   Wear comfortable clothing and clothing appropriate for easy access to any Portacath or PICC line.   We strive to give you quality time with your provider. You may need to reschedule your appointment if you arrive late (15 or more minutes).  Arriving late affects you and other patients whose appointments are after yours.  Also, if you miss three or more appointments without notifying the office, you may be dismissed from the clinic at the provider's discretion.      For prescription refill requests, have your pharmacy contact our office and allow 72 hours for refills to be completed.    Today you received the following chemotherapy and/or immunotherapy agent: Darzalex Faspro   To help prevent nausea and vomiting after your treatment, we encourage you to take your nausea medication as directed.  BELOW ARE SYMPTOMS THAT SHOULD BE REPORTED IMMEDIATELY: *FEVER GREATER THAN 100.4 F (38 C) OR HIGHER *CHILLS OR SWEATING *NAUSEA AND VOMITING THAT IS NOT CONTROLLED WITH YOUR NAUSEA MEDICATION *UNUSUAL SHORTNESS OF BREATH *UNUSUAL BRUISING OR BLEEDING *URINARY PROBLEMS (pain or burning when urinating, or frequent urination) *BOWEL PROBLEMS (unusual diarrhea, constipation, pain near the anus) TENDERNESS IN MOUTH AND THROAT WITH OR WITHOUT PRESENCE OF ULCERS (sore throat, sores in mouth, or a toothache) UNUSUAL RASH, SWELLING OR PAIN  UNUSUAL VAGINAL DISCHARGE OR ITCHING   Items with * indicate a potential emergency and should be followed up as soon as possible or go to the Emergency Department if any problems should occur.  Please show the CHEMOTHERAPY ALERT CARD or IMMUNOTHERAPY ALERT CARD at  check-in to the Emergency Department and triage nurse.  Should you have questions after your visit or need to cancel or reschedule your appointment, please contact [facility]  Dept: [phone]  and follow the prompts.  Office hours are 8:00 a.m. to 4:30 p.m. Monday - Friday. Please note that voicemails left after 4:00 p.m. may not be returned until the following business day.  We are closed weekends and major holidays. You have access to a nurse at all times for urgent questions. Please call the main number to the clinic Dept: [phone] and follow the prompts.   For any non-urgent questions, you may also contact your provider using MyChart. We now offer e-Visits for anyone 4 and older to request care online for non-urgent symptoms. For details visit [email or web address].   Also download the MyChart app! Go to the app store, search "MyChart", open the app, select [facility], and log in with your MyChart username and password.  Daratumumab; Hyaluronidase Injection What is this medication? DARATUMUMAB; HYALURONIDASE (dar a toom ue mab; hye al ur ON i dase) treats multiple myeloma, a type of bone marrow cancer. Daratumumab works by blocking a protein that causes cancer cells to grow and multiply. This helps to slow or stop the spread of cancer cells. Hyaluronidase works by increasing the absorption of other medications in the body to help them work better. This medication may also be used treat amyloidosis, a condition that causes the buildup of a protein (amyloid) in your body. It works by reducing the buildup of this protein, which decreases symptoms. It is a combination  medication that contains a monoclonal antibody. This medicine may be used for other purposes; ask your health care provider or pharmacist if you have questions. COMMON BRAND NAME(S): DARZALEX FASPRO What should I tell my care team before I take this medication? They need to know if you  have any of these conditions: Heart disease Infection, such as chickenpox, cold sores, herpes, hepatitis B Lung or breathing disease An unusual or allergic reaction to daratumumab, hyaluronidase, other medications, foods, dyes, or preservatives Pregnant or trying to get pregnant Breast-feeding How should I use this medication? This medication is injected under the skin. It is given by your care team in a hospital or clinic setting. Talk to your care team about the use of this medication in children. Special care may be needed. Overdosage: If you think you have taken too much of this medicine contact a poison control center or emergency room at once. NOTE: This medicine is only for you. Do not share this medicine with others. What if I miss a dose? Keep appointments for follow-up doses. It is important not to miss your dose. Call your care team if you are unable to keep an appointment. What may interact with this medication? Interactions have not been studied. This list may not describe all possible interactions. Give your health care provider a list of all the medicines, herbs, non-prescription drugs, or dietary supplements you use. Also tell them if you smoke, drink alcohol, or use illegal drugs. Some items may interact with your medicine. What should I watch for while using this medication? Your condition will be monitored carefully while you are receiving this medication. This medication can cause serious allergic reactions. To reduce your risk, your care team may give you other medication to take before receiving this one. Be sure to follow the directions from your care team. This medication can affect the results of blood tests to match your blood type. These changes can last for up to 6 months after the final dose. Your care team will do blood tests to match your blood type before you start treatment. Tell all of your care team that you are being treated with this medication before  receiving a blood transfusion. This medication can affect the results of some tests used to determine treatment response; extra tests may be needed to evaluate response. Talk to your care team if you wish to become pregnant or think you are pregnant. This medication can cause serious birth defects if taken during pregnancy and for 3 months after the last dose. A reliable form of contraception is recommended while taking this medication and for 3 months after the last dose. Talk to your care team about effective forms of contraception. Do not breast-feed while taking this medication. What side effects may I notice from receiving this medication? Side effects that you should report to your care team as soon as possible: Allergic reactions--skin rash, itching, hives, swelling of the face, lips, tongue, or throat Heart rhythm changes--fast or irregular heartbeat, dizziness, feeling faint or lightheaded, chest pain, trouble breathing Infection--fever, chills, cough, sore throat, wounds that don't heal, pain or trouble when passing urine, general feeling of discomfort or being unwell Infusion reactions--chest pain, shortness of breath or trouble breathing, feeling faint or lightheaded Sudden eye pain or change in vision such as blurry vision, seeing halos around lights, vision loss Unusual bruising or bleeding Side effects that usually do not require medical attention (report to your care team if they continue or are bothersome): Constipation Diarrhea  Fatigue Nausea Pain, tingling, or numbness in the hands or feet Swelling of the ankles, hands, or feet This list may not describe all possible side effects. Call your doctor for medical advice about side effects. You may report side effects to FDA at 1-800-FDA-1088. Where should I keep my medication? This medication is given in a hospital or clinic. It will not be stored at home. NOTE: This sheet is a summary. It may not cover all possible information.  If you have questions about this medicine, talk to your doctor, pharmacist, or health care provider.  2024 Elsevier/Gold Standard (2021-11-30 00:00:00)

## 2023-01-10 NOTE — Progress Notes (Signed)
Per Cassandra Heilingoetter, PA ok for treatment today with CMP from 01/03/23. Pt. states he took Decadron 20 mg po at home this am.

## 2023-01-11 ENCOUNTER — Telehealth: Payer: Self-pay | Admitting: Medical Oncology

## 2023-01-11 NOTE — Telephone Encounter (Signed)
Decadron-Pt notified to continue on decadron weekly.

## 2023-01-12 ENCOUNTER — Ambulatory Visit (INDEPENDENT_AMBULATORY_CARE_PROVIDER_SITE_OTHER): Payer: Medicare Other | Admitting: Physician Assistant

## 2023-01-12 ENCOUNTER — Other Ambulatory Visit (INDEPENDENT_AMBULATORY_CARE_PROVIDER_SITE_OTHER): Payer: Medicare Other

## 2023-01-12 DIAGNOSIS — C801 Malignant (primary) neoplasm, unspecified: Secondary | ICD-10-CM

## 2023-01-12 DIAGNOSIS — C7951 Secondary malignant neoplasm of bone: Secondary | ICD-10-CM

## 2023-01-12 DIAGNOSIS — Z9189 Other specified personal risk factors, not elsewhere classified: Secondary | ICD-10-CM

## 2023-01-12 NOTE — Progress Notes (Signed)
Post-Op Visit Note   Patient: Ronald Duran           Date of Birth: 1941-05-28           MRN: [id number] Visit Date: 01/12/2023 PCP: [name], MD   Assessment & Plan:  Chief Complaint:  Chief Complaint  Patient presents with   Left Thigh - Routine Post Op    S/p IM nail left femur 10/25/22   Visit Diagnoses:  1. 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 Impending pathologic fracture     Plan: Patient is a very pleasant 82 year old gentleman who comes in today 12 weeks status post left hip IM nail from an impending pathologic femoral neck fracture, date of surgery 10/25/2022.  He has been doing great in regards to the hip.  He denies any pain or discomfort.  He has recently finished his 10 consecutive sessions of radiation to the left hip.  He is currently ambulating unassisted.  He does carry a cane for comfort.  Examination of the left hip reveals a painless hip flexion.  He is neurovascular intact distally.  At this point, he will increase activity as tolerated.  He will follow-up with [place] as needed.  Call with concerns or questions.  Follow-Up Instructions: Return if symptoms worsen or fail to improve.   Orders:  Orders Placed This Encounter  Procedures   XR FEMUR MIN 2 VIEWS LEFT   No orders of the defined types were placed in this encounter.   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) 10/12/2022   Atrial fibrillation (HCC) 05/12/2022   Prostate cancer (HCC)    PAD (peripheral artery disease) (HCC)     Dyslipidemia    Cardiomyopathy (HCC)    CAD (coronary artery disease)    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 s/p MI with LAD stent placement December, 1999 -last stress test November 2008 with no EKG evidence of ischemia  b. cath 07/23/2015 after abnormal ETT and nuc, occluded mid LAD stent with good distal collateral, medical therapy    Cardiomyopathy (HCC)    Ischemic cardiomyopathy with EF 40% by echo 2005   COVID 2021   mild case   Dyslipidemia    Erectile dysfunction    GERD (gastroesophageal reflux disease)    Hiatal hernia    with esophageal strictures and dilatations in the past - Dr. [name]   History of nuclear  stress test    Myoview 12/16: EF 47%, anterior, anteroseptal and inferoseptal defect suggesting infarct with peri-infarct ischemia, intermediate risk   HTN (hypertension)    Hx of seasonal allergies    Hyperlipidemia    Myocardial infarction (HCC) 08/01/1998   PAD (peripheral artery disease) (HCC)    Pre-diabetes    Prostate cancer (HCC)    with seed implant therapy following Lupron therapy in 2008 and 2009 - followed by Dr. Isabel Caprice   Ringworm    Stroke [facility]) 04/20/2017   seen on a MRI   Vitamin D deficiency     Family History  Problem Relation Age of Onset   CVA Mother    CVA Father 76   CAD Sister     Past Surgical History:  Procedure Laterality Date   BRONCHIAL  BIOPSY  11/15/2022   Procedure: BRONCHIAL BIOPSIES;  Surgeon: [name], DO;  Location: [facility] ENDOSCOPY;  Service: Pulmonary;;   BRONCHIAL BRUSHINGS  11/15/2022   Procedure: BRONCHIAL BRUSHINGS;  Surgeon: [name], DO;  Location: [facility]OS[facility];  Service: Pulmonary;;   BRONCHIAL NEEDLE ASPIRATION BIOPSY  11/15/2022   Procedure: BRONCHIAL NEEDLE ASPIRATION BIOPSIES;  Surgeon: [name], DO;  Location: [facility]OS[facility];  Service: Pulmonary;;   CARDIAC CATHETERIZATION N/A 07/23/2015   Procedure: Left Heart Cath and Coronary Angiography;  Surgeon: [name], MD;  Location: [facility] INVASIVE CV LAB;  Service: Cardiovascular;  Laterality: N/A;   CARDIOVERSION N/A 06/03/2022   Procedure: CARDIOVERSION;  Surgeon: [name], MD;  Location: [facility] ENDOSCOPY;  Service: Cardiovascular;  Laterality: N/A;   COLONOSCOPY WITH PROPOFOL N/A 11/24/2015   Procedure: COLONOSCOPY WITH PROPOFOL;  Surgeon: [name], MD;  Location: [facility] ENDOSCOPY;  Service: Endoscopy;  Laterality: N/A;   esophageal strictures with dilatations in the past per Dr. [name]     last '98   FIDUCIAL MARKER PLACEMENT  11/15/2022   Procedure: FIDUCIAL MARKER PLACEMENT;  Surgeon: [name], DO;  Location: [facility] ENDOSCOPY;  Service: Pulmonary;;   HERNIA REPAIR Left    INTRAMEDULLARY (IM) NAIL INTERTROCHANTERIC Left 10/25/2022   Procedure: LEFT INTRAMEDULLARY (IM) NAIL INTERTROCHANTERIC;  Surgeon: [name], MD;  Location: [facility] OR;  Service: Orthopedics;  Laterality: Left;   IR BONE MARROW BIOPSY & ASPIRATION  11/17/2022   IR BONE TUMOR(S)RF ABLATION  10/11/2022   IR KYPHO THORACIC WITH BONE BIOPSY  10/11/2022   LAD stent placement - Dr. [name]     left inguinal hernia repair - Dr. [name]     prostatic radioactive seed implantation - Dr. [name]     '09   Social History   Occupational History   Not on file  Tobacco Use   Smoking status: Never    Passive exposure: Past   Smokeless tobacco: Never  Vaping Use    Vaping Use: Never used  Substance and Sexual Activity   Alcohol use: Not Currently   Drug use: No   Sexual activity: Not Currently

## 2023-01-13 ENCOUNTER — Other Ambulatory Visit: Payer: Self-pay | Admitting: Physician Assistant

## 2023-01-13 ENCOUNTER — Telehealth: Payer: Self-pay | Admitting: Internal Medicine

## 2023-01-13 ENCOUNTER — Telehealth: Payer: Self-pay

## 2023-01-13 ENCOUNTER — Ambulatory Visit: Payer: Medicare Other | Admitting: Cardiology

## 2023-01-13 NOTE — Telephone Encounter (Signed)
Called patient regarding upcoming appointments, left a voicemail. 

## 2023-01-13 NOTE — Telephone Encounter (Signed)
Pt called this RN in the [facility] clinic around 08:30 this morning about issues regarding post nasal drip which turns into a cough. Patient states he felt better this morning when he woke up but still wanted to get in touch with a nurse for some relief. RN suggested nasal spray or humidifier could help. This RN suggested to check with Oncology MD before taking over the counter medication. Patient stated he tested negative for COVID and no other symptoms present. Patient states he checked his temperature at home and remains afebrile with a BP of 113/81 and HR 85, also taken at home by patient. Patient instructed to call back if he has any other questions, concerns, or if symptoms do not improve.

## 2023-01-13 NOTE — Radiation Completion Notes (Addendum)
Patient Name: Ronald Duran, Ronald Duran MRN: [id number] Date of Birth: [birth year] Referring Physician: Audie Box, M.D. Date of Service: 2023-01-13 Radiation Oncologist: Margaretmary Bayley, M.D. 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   Intent: Palliative to femur and spine; Curative to RLL lung     ==========DELIVERED PLANS==========  First Treatment Date: 2022-12-12 - Last Treatment Date: 2022-12-23   Plan Name: Lung_RLL_SBRT Site: Lung, Right Technique: SBRT/SRT-3D Mode: Photon Dose Per Fraction: 18 Gy Prescribed Dose (Delivered / Prescribed): 54 Gy / 54 Gy Prescribed Fxs (Delivered / Prescribed): 3 / 3   Plan Name: Pelvis_L_Fem Site: Hip, Left Technique: 3D Mode: Photon Dose Per Fraction: 3 Gy Prescribed Dose (Delivered / Prescribed): 3 Gy / 3 Gy Prescribed Fxs (Delivered / Prescribed): 1 / 1   Plan Name: Pelvis_L_Fem:1 Site: Hip, Left Technique: 3D Mode: Photon Dose Per Fraction: 3 Gy Prescribed Dose (Delivered / Prescribed): 27 Gy / 27 Gy Prescribed Fxs (Delivered / Prescribed): 9 / 9   Plan Name: Spine_T Site: Thoracic Spine Technique: 3D Mode: Photon Dose Per Fraction: 3 Gy Prescribed Dose (Delivered / Prescribed): 30 Gy / 30 Gy Prescribed Fxs (Delivered / Prescribed): 10 / 10     ==========ON TREATMENT VISIT DATES========== 2022-12-15, 2022-12-19, 2022-12-22, 2022-12-23   See weekly On Treatment Notes in Epic for details.  He tolerated the treatments well without any acute ill side effects.  The patient will receive a call in about one month from the radiation oncology department. He will continue follow up with his medical oncologist, Dr. Arbutus Ped as well.  ------------------------------------------------   Margaretmary Dys, MD [facility]  Radiation  Oncology Direct Dial: [phone]  Fax: [phone] [email or web address]  Skype  LinkedIn

## 2023-01-14 ENCOUNTER — Ambulatory Visit (INDEPENDENT_AMBULATORY_CARE_PROVIDER_SITE_OTHER): Payer: Medicare Other

## 2023-01-14 ENCOUNTER — Ambulatory Visit
Admission: RE | Admit: 2023-01-14 | Discharge: 2023-01-14 | Disposition: A | Discharge status: Home / Self Care | Payer: Medicare Other | Source: Ambulatory Visit | Attending: Urgent Care | Admitting: Urgent Care

## 2023-01-14 VITALS — BP 135/82 | HR 85 | Temp 98.3°F | Resp 20

## 2023-01-14 DIAGNOSIS — B9789 Other viral agents as the cause of diseases classified elsewhere: Secondary | ICD-10-CM

## 2023-01-14 DIAGNOSIS — J90 Pleural effusion, not elsewhere classified: Secondary | ICD-10-CM

## 2023-01-14 DIAGNOSIS — J988 Other specified respiratory disorders: Secondary | ICD-10-CM

## 2023-01-14 MED ORDER — CETIRIZINE HCL 10 MG PO TABS: 10.0000 mg | ORAL_TABLET | Freq: Every day | ORAL | 0 refills | Status: AC

## 2023-01-14 MED ORDER — PROMETHAZINE-DM 6.25-15 MG/5ML PO SYRP: 2.5000 mL | ORAL_SOLUTION | Freq: Three times a day (TID) | ORAL | 0 refills | Status: DC | PRN

## 2023-01-14 NOTE — Discharge Instructions (Addendum)
You chest x-ray did not show pneumonia in the lungs. It did show the same mild bronchitic changes as seen before. There was a small amount of fluid in either side of your lower lungs called pleural effusion. This is new compared to previous images taken.  This may be cancer related and therefore I need you to contact your oncologist to soon as possible for further instruction.  Since you are not experiencing shortness of breath or chest pain or general difficulty with your breathing I would not recommend going to the emergency room right now.  However if those symptoms develop then please go to the emergency room and do not wait on your oncologist.  Otherwise please use the medications I prescribed to help you with your coughing and general sick symptoms.

## 2023-01-14 NOTE — ED Provider Notes (Addendum)
[facility] - URGENT CARE CENTER  Note:  This document was prepared using Conservation officer, historic buildings and may include unintentional dictation errors.  MRN: [id number] DOB: August 31, 1940  Subjective:   GEREMY RISTER is a 82 y.o. male presenting for 2-day history of a productive cough, slight malaise.  Earlier in the week he had throat pain but is better now.  He has done 3 COVID tests at home and all have been negative.  His wife has also done COVID test and she has been negative.  No fever, sinus pain, chest pain, shortness of breath, wheezing, difficulty breathing.  Patient is currently undergoing treatment for multiple myeloma, metastases to the bone.  Is undergoing both chemotherapy and radiation therapy for non-small cell cancer of the right lung.  Last biologic treatment was 5 days ago.  Last radiation was 12/19/2022, received 3 total treatments. He is also on chronic anticoagulation secondary to his atrial fibrillation.  Takes warfarin for this.  No current facility-administered medications for this encounter.  Current Outpatient Medications:    acyclovir (ZOVIRAX) 200 MG capsule, Take 1 capsule (200 mg total) by mouth 2 (two) times daily., Disp: 60 capsule, Rfl: 5   atorvastatin (LIPITOR) 40 MG tablet, Take 40 mg by mouth at bedtime., Disp: , Rfl:    B Complex Vitamins (B COMPLEX 100) tablet, Take 1 tablet by mouth daily. Balance, Disp: , Rfl:    cetirizine (ZYRTEC) 10 MG tablet, Take 10 mg by mouth daily., Disp: , Rfl:    Cholecalciferol (VITAMIN D3) 2000 UNITS TABS, Take 2,000 Units by mouth daily., Disp: , Rfl:    Coenzyme Q10 (CO Q-10) 100 MG CAPS, Take 100 mg by mouth daily with supper., Disp: , Rfl:    dexamethasone (DECADRON) 4 MG tablet, 5 tab weekly start with the first dose of chemotherapy., Disp: 40 tablet, Rfl: 3   lenalidomide (REVLIMID) 15 MG capsule, Take 1 capsule (15 mg total) by mouth daily. Take for 14 days, then none for 7 days. Repeat every 21 days. Start  Revlimid on day 1 of chemotherapy Auth #[id number] date obtained 12/19/2022 Adult Male., Disp: 14 capsule, Rfl: 0   metoprolol succinate (TOPROL XL) 25 MG 24 hr tablet, Take 1 tablet (25 mg total) by mouth daily., Disp: 90 tablet, Rfl: 3   Multiple Vitamins-Minerals (CENTRUM) tablet, Take 1 tablet by mouth daily., Disp: , Rfl:    nitroGLYCERIN (NITROSTAT) 0.4 MG SL tablet, PLACE 1 TABLET UNDER THE TONGUE IF NEEDED EVERY 5 MINUTES FOR CHEST PAIN FOR 3 DOSES IF NO RELIEF AFTER FIRST DOSE CALL 911., Disp: 25 tablet, Rfl: 9   nystatin (MYCOSTATIN/NYSTOP) powder, Apply 1 Application topically 3 (three) times daily., Disp: 30 g, Rfl: 0   omeprazole (PRILOSEC) 20 MG capsule, Take 20 mg by mouth daily before breakfast., Disp: , Rfl:    prochlorperazine (COMPAZINE) 10 MG tablet, Take 1 tablet (10 mg total) by mouth every 6 (six) hours as needed for nausea or vomiting., Disp: 30 tablet, Rfl: 1   triamcinolone ointment (KENALOG) 0.5 %, Apply 1 Application topically 2 (two) times daily., Disp: 30 g, Rfl: 0   warfarin (COUMADIN) 5 MG tablet, TAKE 1/2 TO 1 TABLET BY MOUTH DAILY AS DIRECTED BY COUMADIN CLINIC (Patient taking differently: Take 2.5-5 mg by mouth See admin instructions. Take 2.5 mg by mouth on Monday, Wednesday and Friday and take 5 mg on Tuesday, Thursday, Saturday and Sunday), Disp: 100 tablet, Rfl: 0   No Known Allergies  Past Medical History:  Diagnosis Date   Atrial fibrillation (HCC)    CAD (coronary artery disease)    a. s/p MI with LAD stent placement December, 1999 -last stress test November 2008 with no EKG evidence of ischemia  b. cath 07/23/2015 after abnormal ETT and nuc, occluded mid LAD stent with good distal collateral, medical therapy    Cardiomyopathy (HCC)    Ischemic cardiomyopathy with EF 40% by echo 2005   COVID 2021   mild case   Dyslipidemia    Erectile dysfunction    GERD (gastroesophageal reflux disease)    Hiatal hernia    with esophageal strictures and dilatations  in the past - Dr. [name]   History of nuclear stress test    Myoview 12/16: EF 47%, anterior, anteroseptal and inferoseptal defect suggesting infarct with peri-infarct ischemia, intermediate risk   HTN (hypertension)    Hx of seasonal allergies    Hyperlipidemia    Myocardial infarction (HCC) 08/01/1998   PAD (peripheral artery disease) (HCC)    Pre-diabetes    Prostate cancer (HCC)    with seed implant therapy following Lupron therapy in 2008 and 2009 - followed by Dr. Pollyann Glen    Stroke (HCC) 04/20/2017   seen on a MRI   Vitamin D deficiency      Past Surgical History:  Procedure Laterality Date   BRONCHIAL BIOPSY  11/15/2022   Procedure: BRONCHIAL BIOPSIES;  Surgeon: [name], DO;  Location: [facility] ENDOSCOPY;  Service: Pulmonary;;   BRONCHIAL BRUSHINGS  11/15/2022   Procedure: BRONCHIAL BRUSHINGS;  Surgeon: [name], DO;  Location: [facility] ENDOSCOPY;  Service: Pulmonary;;   BRONCHIAL NEEDLE ASPIRATION BIOPSY  11/15/2022   Procedure: BRONCHIAL NEEDLE ASPIRATION BIOPSIES;  Surgeon: [name], DO;  Location: [facility] ENDOSCOPY;  Service: Pulmonary;;   CARDIAC CATHETERIZATION N/A 07/23/2015   Procedure: Left Heart Cath and Coronary Angiography;  Surgeon: [name], MD;  Location: [facility] INVASIVE CV LAB;  Service: Cardiovascular;  Laterality: N/A;   CARDIOVERSION N/A 06/03/2022   Procedure: CARDIOVERSION;  Surgeon: [name], MD;  Location: [facility] ENDOSCOPY;  Service: Cardiovascular;  Laterality: N/A;   COLONOSCOPY WITH PROPOFOL N/A 11/24/2015   Procedure: COLONOSCOPY WITH PROPOFOL;  Surgeon: [name], MD;  Location: [facility] ENDOSCOPY;  Service: Endoscopy;  Laterality: N/A;   esophageal strictures with dilatations in the past per Dr. [name]     last '98   FIDUCIAL MARKER PLACEMENT  11/15/2022   Procedure: FIDUCIAL MARKER PLACEMENT;  Surgeon: [name], DO;  Location: [facility] ENDOSCOPY;  Service: Pulmonary;;   HERNIA REPAIR Left    INTRAMEDULLARY (IM) NAIL  INTERTROCHANTERIC Left 10/25/2022   Procedure: LEFT INTRAMEDULLARY (IM) NAIL INTERTROCHANTERIC;  Surgeon: [name], MD;  Location: [facility] OR;  Service: Orthopedics;  Laterality: Left;   IR BONE MARROW BIOPSY & ASPIRATION  11/17/2022   IR BONE TUMOR(S)RF ABLATION  10/11/2022   IR KYPHO THORACIC WITH BONE BIOPSY  10/11/2022   LAD stent placement - Dr. [name]     left inguinal hernia repair - Dr. [name]     prostatic radioactive seed implantation - Dr. [name]     '09    Family History  Problem Relation Age of Onset   CVA Mother    CVA Father 67   CAD Sister     Social History   Tobacco Use   Smoking status: Never    Passive exposure: Past   Smokeless tobacco: Never  Vaping Use  Vaping Use: Never used  Substance Use Topics   Alcohol use: Not Currently   Drug use: No    ROS   Objective:   Vitals: BP 135/82 (BP Location: Right Arm)   Pulse 85   Temp 98.3 F (36.8 C) (Oral)   Resp 20   SpO2 96%   Physical Exam Constitutional:      General: He is not in acute distress.    Appearance: Normal appearance. He is well-developed. He is not ill-appearing, toxic-appearing or diaphoretic.  HENT:     Head: Normocephalic and atraumatic.     Right Ear: External ear normal.     Left Ear: External ear normal.     Nose: Nose normal.     Mouth/Throat:     Mouth: Mucous membranes are moist.  Eyes:     General: No scleral icterus.       Right eye: No discharge.        Left eye: No discharge.     Extraocular Movements: Extraocular movements intact.  Cardiovascular:     Rate and Rhythm: Normal rate and regular rhythm.     Heart sounds: Normal heart sounds. No murmur heard.    No friction rub. No gallop.  Pulmonary:     Effort: Pulmonary effort is normal. No respiratory distress.     Breath sounds: Normal breath sounds. No stridor. No wheezing, rhonchi or rales.  Neurological:     Mental Status: He is alert and oriented to person, place, and time.  Psychiatric:         Mood and Affect: Mood normal.        Behavior: Behavior normal.        Thought Content: Thought content normal.     DG Chest 2 View  Result Date: 01/14/2023 CLINICAL DATA:  Productive cough EXAM: CHEST - 2 VIEW COMPARISON:  Prior chest x-ray 11/15/2022 FINDINGS: Interval development of small bilateral pleural effusions. No focal airspace infiltrate. Diffuse mild bronchitic changes are similar compared to prior. Prior T10 cement augmentation. No acute osseous abnormality. IMPRESSION: Small bilateral pleural effusions are new compared to prior chest x-ray from 11/15/2022. No focal airspace infiltrate to suggest bacterial pneumonia. Diffuse mild bronchitic changes are similar compared to prior. Electronically Signed   By: Malachy Moan M.D.   On: 01/14/2023 12:55     Assessment and Plan :   PDMP not reviewed this encounter.  1. Viral respiratory infection   2. Bilateral pleural effusion    Patient has a clear cardiopulmonary exam and is not exhibiting symptoms of shortness of breath, respiratory distress, chest tightness, chest pain, wheezing.  He has new small bilateral pleural effusion when compared to imaging from November 15, 2022.  I discussed this with patient and at this stage as he is hemodynamically stable, not exhibiting significant respiratory symptoms I will defer an ER visit but maintain strict ER precautions.  He is to contact his oncologist as soon as possible.  Otherwise, recommend supportive care for an acute viral respiratory illness.  He declined a COVID test.  Will defer antibiotic use as there is no focal airspace infiltrate to suggest bacterial pneumonia.  Counseled patient on potential for adverse effects with medications prescribed/recommended today, ER and return-to-clinic precautions discussed, patient verbalized understanding.     Wallis Bamberg, [place] 01/14/23 [phone]

## 2023-01-14 NOTE — ED Triage Notes (Addendum)
Pt c/o prod cough x 2 days-neg covid home tests-sore throat earlier in the week/denies at present-NAD-steady gait

## 2023-01-16 ENCOUNTER — Other Ambulatory Visit: Payer: Medicare Other

## 2023-01-16 ENCOUNTER — Ambulatory Visit: Payer: Medicare Other | Admitting: Internal Medicine

## 2023-01-16 ENCOUNTER — Telehealth: Payer: Self-pay | Admitting: Medical Oncology

## 2023-01-16 ENCOUNTER — Telehealth: Payer: Self-pay | Admitting: Internal Medicine

## 2023-01-16 ENCOUNTER — Ambulatory Visit: Payer: Medicare Other

## 2023-01-16 ENCOUNTER — Telehealth: Payer: Self-pay

## 2023-01-16 NOTE — Telephone Encounter (Signed)
Pt called at appx 0830 this morning with c/o strong cough that worsened over the weekend. Pt states that he also has been coughing up brown mucous which led him to go to urgent care over the weekend and did 4 at home COVID test that were negative. Pt visited an urgent care where he stated they performed a CXR and prescribed him promethazine and saline nasal spray. Pt denies fever, which he has been taking at home, chills, or other symptoms. Pt states the urgent care stated he was negative for pneumonia. This RN let patient and patient's wife know that this RN will call back once discussed with Kait, PA.

## 2023-01-16 NOTE — Telephone Encounter (Addendum)
Med management- He has a question about the timing of a med he takes before chemo . He did not leave the name of med. I LVM that if the med is decadron then he needs to take it once a week -same day every week.

## 2023-01-16 NOTE — Telephone Encounter (Addendum)
I called pt and told him to take the decadron q week same date. His temp now is  99.7 and he took tylenol. I reminded pt of when to call.

## 2023-01-16 NOTE — Telephone Encounter (Signed)
Pt's wife called back at appx [phone] in regards to possible [facility] appointment for pt. Ronald Cirri, PA made aware and discussed with this RN to instruct patient to continue to take promethazine and continue to monitor for a temperature over 100.4. Pt's wife and pt informed over the phone to call back or revisit urgent care clinic if new or worsening symptoms develop or if patient develops a temperature over 100.4.

## 2023-01-16 NOTE — Telephone Encounter (Addendum)
Intermittent coughing when lying down. "Drinking herbal tea helps somewhat'. CXR in ED -"small pleural effusion".

## 2023-01-17 ENCOUNTER — Ambulatory Visit: Payer: Medicare Other

## 2023-01-17 ENCOUNTER — Inpatient Hospital Stay: Payer: Medicare Other

## 2023-01-17 ENCOUNTER — Other Ambulatory Visit: Payer: Medicare Other

## 2023-01-17 ENCOUNTER — Ambulatory Visit: Payer: Medicare Other | Admitting: Physician Assistant

## 2023-01-17 ENCOUNTER — Telehealth: Payer: Self-pay | Admitting: Internal Medicine

## 2023-01-17 ENCOUNTER — Encounter: Payer: Self-pay | Admitting: Internal Medicine

## 2023-01-18 ENCOUNTER — Inpatient Hospital Stay: Payer: Medicare Other

## 2023-01-18 ENCOUNTER — Other Ambulatory Visit: Payer: Self-pay

## 2023-01-18 ENCOUNTER — Encounter: Payer: Self-pay | Admitting: Medical Oncology

## 2023-01-18 ENCOUNTER — Ambulatory Visit: Payer: Medicare Other | Attending: Cardiovascular Disease | Admitting: *Deleted

## 2023-01-18 VITALS — BP 112/91 | HR 94 | Temp 97.6°F | Resp 20

## 2023-01-18 DIAGNOSIS — I4819 Other persistent atrial fibrillation: Secondary | ICD-10-CM

## 2023-01-18 DIAGNOSIS — I6349 Cerebral infarction due to embolism of other cerebral artery: Secondary | ICD-10-CM | POA: Diagnosis not present

## 2023-01-18 DIAGNOSIS — Z79899 Other long term (current) drug therapy: Secondary | ICD-10-CM | POA: Diagnosis not present

## 2023-01-18 DIAGNOSIS — Z5112 Encounter for antineoplastic immunotherapy: Secondary | ICD-10-CM | POA: Diagnosis not present

## 2023-01-18 DIAGNOSIS — C9000 Multiple myeloma not having achieved remission: Secondary | ICD-10-CM

## 2023-01-18 DIAGNOSIS — Z7901 Long term (current) use of anticoagulants: Secondary | ICD-10-CM

## 2023-01-18 LAB — CMP (CANCER CENTER ONLY)
ALT: 26 U/L (ref 0–44)
AST: 23 U/L (ref 15–41)
Albumin: 3.3 g/dL — ABNORMAL LOW (ref 3.5–5.0)
Alkaline Phosphatase: 69 U/L (ref 38–126)
Anion gap: 8 (ref 5–15)
BUN: 23 mg/dL (ref 8–23)
CO2: 25 mmol/L (ref 22–32)
Calcium: 9.6 mg/dL (ref 8.9–10.3)
Chloride: 104 mmol/L (ref 98–111)
Creatinine: 0.84 mg/dL (ref 0.61–1.24)
GFR, Estimated: >60 mL/min (ref >60)
Glucose, Bld: 204 mg/dL — ABNORMAL HIGH (ref 70–99)
Potassium: 4.3 mmol/L (ref 3.5–5.1)
Sodium: 137 mmol/L (ref 135–145)
Total Bilirubin: 1.5 mg/dL — ABNORMAL HIGH (ref 0.3–1.2)
Total Protein: 6.1 g/dL — ABNORMAL LOW (ref 6.5–8.1)

## 2023-01-18 LAB — CBC WITH DIFFERENTIAL (CANCER CENTER ONLY)
Abs Immature Granulocytes: 0.07 10*3/uL (ref 0.00–0.07)
Basophils Absolute: 0 10*3/uL (ref 0.0–0.1)
Basophils Relative: 0 %
Eosinophils Absolute: 0 10*3/uL (ref 0.0–0.5)
Eosinophils Relative: 0 %
HCT: 39.8 % (ref 39.0–52.0)
Hemoglobin: 14 g/dL (ref 13.0–17.0)
Immature Granulocytes: 1 %
Lymphocytes Relative: 3 %
Lymphs Abs: 0.3 10*3/uL — ABNORMAL LOW (ref 0.7–4.0)
MCH: 34.5 pg — ABNORMAL HIGH (ref 26.0–34.0)
MCHC: 35.2 g/dL (ref 30.0–36.0)
MCV: 98 fL (ref 80.0–100.0)
Monocytes Absolute: 0.5 10*3/uL (ref 0.1–1.0)
Monocytes Relative: 4 %
Neutro Abs: 10.5 10*3/uL — ABNORMAL HIGH (ref 1.7–7.7)
Neutrophils Relative %: 92 %
Platelet Count: 129 10*3/uL — ABNORMAL LOW (ref 150–400)
RBC: 4.06 MIL/uL — ABNORMAL LOW (ref 4.22–5.81)
RDW: 14.6 % (ref 11.5–15.5)
WBC Count: 11.4 10*3/uL — ABNORMAL HIGH (ref 4.0–10.5)
nRBC: 0 % (ref 0.0–0.2)

## 2023-01-18 LAB — POCT INR: INR: 2.5 (ref 2.0–3.0)

## 2023-01-18 MED ORDER — DEXAMETHASONE 4 MG PO TABS
20.0000 mg | ORAL_TABLET | Freq: Once | ORAL | Status: AC
  Administered 2023-01-18: 20 mg via ORAL
  Filled 2023-01-18: qty 5

## 2023-01-18 MED ORDER — DARATUMUMAB-HYALURONIDASE-FIHJ 1800-30000 MG-UT/15ML ~~LOC~~ SOLN
1800.0000 mg | Freq: Once | SUBCUTANEOUS | Status: AC
  Administered 2023-01-18: 1800 mg via SUBCUTANEOUS
  Filled 2023-01-18: qty 15

## 2023-01-18 MED ORDER — DIPHENHYDRAMINE HCL 25 MG PO CAPS
50.0000 mg | ORAL_CAPSULE | Freq: Once | ORAL | Status: AC
  Administered 2023-01-18: 50 mg via ORAL
  Filled 2023-01-18: qty 2

## 2023-01-18 MED ORDER — ACETAMINOPHEN 325 MG PO TABS
650.0000 mg | ORAL_TABLET | Freq: Once | ORAL | Status: AC
  Administered 2023-01-18: 650 mg via ORAL
  Filled 2023-01-18: qty 2

## 2023-01-18 NOTE — Patient Instructions (Signed)
Description   Continue taking warfarin 1 tablet daily EXCEPT 1/2 Mondays, Wednesdays, and Fridays.  Continue eating broccoli only every 2-3 days per week.  Recheck INR 3 weeks  Anticoagulation Clinic-[phone].

## 2023-01-18 NOTE — Progress Notes (Signed)
Patient took his dexamethasone 20mg  PO at home yesterday at 0830. Clarified from patient perspective- patient does need to take the 20mg  po dexamethasone today with treatment. Kept the patient for a 30 minute observation- no issues. Minor localized redness- not outside normal. No actual rash. VSS_ BP (!) 112/91 (BP Location: Left Arm, Patient Position: Sitting)   Pulse 94   Temp 97.6 F (36.4 C) (Oral)   Resp 20   SpO2 96%   Patient feels "normal". Patient sent home with wife- ambulatory to the lobby.

## 2023-01-18 NOTE — Progress Notes (Unsigned)
Per Dr. Arbutus Ped give pt decadron 20 mg as ordered for  today .

## 2023-01-18 NOTE — Patient Instructions (Signed)
[facility]  Discharge Instructions: Thank you for choosing [facility] to provide your oncology and hematology care.   If you have a lab appointment with the Cancer Center, please go directly to the Cancer Center and check in at the registration area.   Wear comfortable clothing and clothing appropriate for easy access to any Portacath or PICC line.   We strive to give you quality time with your provider. You may need to reschedule your appointment if you arrive late (15 or more minutes).  Arriving late affects you and other patients whose appointments are after yours.  Also, if you miss three or more appointments without notifying the office, you may be dismissed from the clinic at the provider's discretion.      For prescription refill requests, have your pharmacy contact our office and allow 72 hours for refills to be completed.    Today you received the following chemotherapy and/or immunotherapy agent: Darzalex Faspro   To help prevent nausea and vomiting after your treatment, we encourage you to take your nausea medication as directed.  BELOW ARE SYMPTOMS THAT SHOULD BE REPORTED IMMEDIATELY: *FEVER GREATER THAN 100.4 F (38 C) OR HIGHER *CHILLS OR SWEATING *NAUSEA AND VOMITING THAT IS NOT CONTROLLED WITH YOUR NAUSEA MEDICATION *UNUSUAL SHORTNESS OF BREATH *UNUSUAL BRUISING OR BLEEDING *URINARY PROBLEMS (pain or burning when urinating, or frequent urination) *BOWEL PROBLEMS (unusual diarrhea, constipation, pain near the anus) TENDERNESS IN MOUTH AND THROAT WITH OR WITHOUT PRESENCE OF ULCERS (sore throat, sores in mouth, or a toothache) UNUSUAL RASH, SWELLING OR PAIN  UNUSUAL VAGINAL DISCHARGE OR ITCHING   Items with * indicate a potential emergency and should be followed up as soon as possible or go to the Emergency Department if any problems should occur.  Please show the CHEMOTHERAPY ALERT CARD or IMMUNOTHERAPY ALERT CARD at  check-in to the Emergency Department and triage nurse.  Should you have questions after your visit or need to cancel or reschedule your appointment, please contact [facility]  Dept: [phone]  and follow the prompts.  Office hours are 8:00 a.m. to 4:30 p.m. Monday - Friday. Please note that voicemails left after 4:00 p.m. may not be returned until the following business day.  We are closed weekends and major holidays. You have access to a nurse at all times for urgent questions. Please call the main number to the clinic Dept: [phone] and follow the prompts.   For any non-urgent questions, you may also contact your provider using MyChart. We now offer e-Visits for anyone 18 and older to request care online for non-urgent symptoms. For details visit [name].   Also download the MyChart app! Go to the app store, search "MyChart", open the app, select [facility], and log in with your MyChart username and password.  Daratumumab; Hyaluronidase Injection What is this medication? DARATUMUMAB; HYALURONIDASE (dar a toom ue mab; hye al ur ON i dase) treats multiple myeloma, a type of bone marrow cancer. Daratumumab works by blocking a protein that causes cancer cells to grow and multiply. This helps to slow or stop the spread of cancer cells. Hyaluronidase works by increasing the absorption of other medications in the body to help them work better. This medication may also be used treat amyloidosis, a condition that causes the buildup of a protein (amyloid) in your body. It works by reducing the buildup of this protein, which decreases symptoms. It is a combination   medication that contains a monoclonal antibody. This medicine may be used for other purposes; ask your health care provider or pharmacist if you have questions. COMMON BRAND NAME(S): DARZALEX FASPRO What should I tell my care team before I take this medication? They need to know if you  have any of these conditions: Heart disease Infection, such as chickenpox, cold sores, herpes, hepatitis B Lung or breathing disease An unusual or allergic reaction to daratumumab, hyaluronidase, other medications, foods, dyes, or preservatives Pregnant or trying to get pregnant Breast-feeding How should I use this medication? This medication is injected under the skin. It is given by your care team in a hospital or clinic setting. Talk to your care team about the use of this medication in children. Special care may be needed. Overdosage: If you think you have taken too much of this medicine contact a poison control center or emergency room at once. NOTE: This medicine is only for you. Do not share this medicine with others. What if I miss a dose? Keep appointments for follow-up doses. It is important not to miss your dose. Call your care team if you are unable to keep an appointment. What may interact with this medication? Interactions have not been studied. This list may not describe all possible interactions. Give your health care provider a list of all the medicines, herbs, non-prescription drugs, or dietary supplements you use. Also tell them if you smoke, drink alcohol, or use illegal drugs. Some items may interact with your medicine. What should I watch for while using this medication? Your condition will be monitored carefully while you are receiving this medication. This medication can cause serious allergic reactions. To reduce your risk, your care team may give you other medication to take before receiving this one. Be sure to follow the directions from your care team. This medication can affect the results of blood tests to match your blood type. These changes can last for up to 6 months after the final dose. Your care team will do blood tests to match your blood type before you start treatment. Tell all of your care team that you are being treated with this medication before  receiving a blood transfusion. This medication can affect the results of some tests used to determine treatment response; extra tests may be needed to evaluate response. Talk to your care team if you wish to become pregnant or think you are pregnant. This medication can cause serious birth defects if taken during pregnancy and for 3 months after the last dose. A reliable form of contraception is recommended while taking this medication and for 3 months after the last dose. Talk to your care team about effective forms of contraception. Do not breast-feed while taking this medication. What side effects may I notice from receiving this medication? Side effects that you should report to your care team as soon as possible: Allergic reactions--skin rash, itching, hives, swelling of the face, lips, tongue, or throat Heart rhythm changes--fast or irregular heartbeat, dizziness, feeling faint or lightheaded, chest pain, trouble breathing Infection--fever, chills, cough, sore throat, wounds that don't heal, pain or trouble when passing urine, general feeling of discomfort or being unwell Infusion reactions--chest pain, shortness of breath or trouble breathing, feeling faint or lightheaded Sudden eye pain or change in vision such as blurry vision, seeing halos around lights, vision loss Unusual bruising or bleeding Side effects that usually do not require medical attention (report to your care team if they continue or are bothersome): Constipation Diarrhea   Fatigue Nausea Pain, tingling, or numbness in the hands or feet Swelling of the ankles, hands, or feet This list may not describe all possible side effects. Call your doctor for medical advice about side effects. You may report side effects to FDA at 1-800-FDA-1088. Where should I keep my medication? This medication is given in a hospital or clinic. It will not be stored at home. NOTE: This sheet is a summary. It may not cover all possible information.  If you have questions about this medicine, talk to your doctor, pharmacist, or health care provider.  2024 Elsevier/Gold Standard (2021-11-30 00:00:00)   

## 2023-01-18 NOTE — Telephone Encounter (Signed)
Issue addressed with another my chart encounter.

## 2023-01-19 ENCOUNTER — Ambulatory Visit: Payer: Medicare Other

## 2023-01-23 ENCOUNTER — Ambulatory Visit: Payer: Medicare Other

## 2023-01-23 ENCOUNTER — Ambulatory Visit: Payer: Medicare Other | Admitting: Internal Medicine

## 2023-01-23 ENCOUNTER — Other Ambulatory Visit: Payer: Medicare Other

## 2023-01-24 ENCOUNTER — Inpatient Hospital Stay (HOSPITAL_BASED_OUTPATIENT_CLINIC_OR_DEPARTMENT_OTHER): Payer: Medicare Other | Admitting: Internal Medicine

## 2023-01-24 ENCOUNTER — Other Ambulatory Visit: Payer: Self-pay

## 2023-01-24 ENCOUNTER — Encounter: Payer: Self-pay | Admitting: Medical Oncology

## 2023-01-24 ENCOUNTER — Inpatient Hospital Stay (HOSPITAL_COMMUNITY)
Admission: EM | Admit: 2023-01-24 | Discharge: 2023-01-28 | DRG: 308 | Disposition: A | Discharge status: Home / Self Care | Payer: Medicare Other | Attending: Family Medicine | Admitting: Family Medicine

## 2023-01-24 ENCOUNTER — Inpatient Hospital Stay: Payer: Medicare Other

## 2023-01-24 ENCOUNTER — Encounter (HOSPITAL_COMMUNITY): Payer: Self-pay | Admitting: Internal Medicine

## 2023-01-24 ENCOUNTER — Telehealth: Payer: Self-pay | Admitting: Internal Medicine

## 2023-01-24 ENCOUNTER — Emergency Department (HOSPITAL_COMMUNITY): Payer: Medicare Other

## 2023-01-24 VITALS — BP 112/79 | HR 137 | Temp 98.0°F | Resp 18 | Ht 72.0 in | Wt 187.4 lb

## 2023-01-24 DIAGNOSIS — C3431 Malignant neoplasm of lower lobe, right bronchus or lung: Secondary | ICD-10-CM | POA: Diagnosis present

## 2023-01-24 DIAGNOSIS — Z5982 Transportation insecurity: Secondary | ICD-10-CM

## 2023-01-24 DIAGNOSIS — Z1152 Encounter for screening for COVID-19: Secondary | ICD-10-CM | POA: Diagnosis not present

## 2023-01-24 DIAGNOSIS — I255 Ischemic cardiomyopathy: Secondary | ICD-10-CM | POA: Diagnosis present

## 2023-01-24 DIAGNOSIS — C9000 Multiple myeloma not having achieved remission: Secondary | ICD-10-CM

## 2023-01-24 DIAGNOSIS — I252 Old myocardial infarction: Secondary | ICD-10-CM | POA: Diagnosis not present

## 2023-01-24 DIAGNOSIS — I513 Intracardiac thrombosis, not elsewhere classified: Secondary | ICD-10-CM | POA: Diagnosis present

## 2023-01-24 DIAGNOSIS — I110 Hypertensive heart disease with heart failure: Secondary | ICD-10-CM | POA: Diagnosis present

## 2023-01-24 DIAGNOSIS — R791 Abnormal coagulation profile: Secondary | ICD-10-CM | POA: Diagnosis present

## 2023-01-24 DIAGNOSIS — K219 Gastro-esophageal reflux disease without esophagitis: Secondary | ICD-10-CM | POA: Diagnosis present

## 2023-01-24 DIAGNOSIS — I4891 Unspecified atrial fibrillation: Principal | ICD-10-CM

## 2023-01-24 DIAGNOSIS — E785 Hyperlipidemia, unspecified: Secondary | ICD-10-CM | POA: Diagnosis present

## 2023-01-24 DIAGNOSIS — I4819 Other persistent atrial fibrillation: Secondary | ICD-10-CM | POA: Diagnosis present

## 2023-01-24 DIAGNOSIS — I2510 Atherosclerotic heart disease of native coronary artery without angina pectoris: Secondary | ICD-10-CM | POA: Diagnosis present

## 2023-01-24 DIAGNOSIS — Z8673 Personal history of transient ischemic attack (TIA), and cerebral infarction without residual deficits: Secondary | ICD-10-CM

## 2023-01-24 DIAGNOSIS — Z8249 Family history of ischemic heart disease and other diseases of the circulatory system: Secondary | ICD-10-CM

## 2023-01-24 DIAGNOSIS — J189 Pneumonia, unspecified organism: Secondary | ICD-10-CM | POA: Diagnosis present

## 2023-01-24 DIAGNOSIS — Z79899 Other long term (current) drug therapy: Secondary | ICD-10-CM | POA: Diagnosis not present

## 2023-01-24 DIAGNOSIS — Z85820 Personal history of malignant melanoma of skin: Secondary | ICD-10-CM | POA: Insufficient documentation

## 2023-01-24 DIAGNOSIS — R7303 Prediabetes: Secondary | ICD-10-CM | POA: Diagnosis present

## 2023-01-24 DIAGNOSIS — I10 Essential (primary) hypertension: Secondary | ICD-10-CM | POA: Diagnosis not present

## 2023-01-24 DIAGNOSIS — K222 Esophageal obstruction: Secondary | ICD-10-CM

## 2023-01-24 DIAGNOSIS — I739 Peripheral vascular disease, unspecified: Secondary | ICD-10-CM | POA: Diagnosis present

## 2023-01-24 DIAGNOSIS — I236 Thrombosis of atrium, auricular appendage, and ventricle as current complications following acute myocardial infarction: Secondary | ICD-10-CM | POA: Diagnosis not present

## 2023-01-24 DIAGNOSIS — Z923 Personal history of irradiation: Secondary | ICD-10-CM

## 2023-01-24 DIAGNOSIS — I5022 Chronic systolic (congestive) heart failure: Secondary | ICD-10-CM | POA: Diagnosis present

## 2023-01-24 DIAGNOSIS — Z7901 Long term (current) use of anticoagulants: Secondary | ICD-10-CM

## 2023-01-24 DIAGNOSIS — I482 Chronic atrial fibrillation, unspecified: Secondary | ICD-10-CM | POA: Diagnosis not present

## 2023-01-24 DIAGNOSIS — Z823 Family history of stroke: Secondary | ICD-10-CM

## 2023-01-24 DIAGNOSIS — I639 Cerebral infarction, unspecified: Secondary | ICD-10-CM | POA: Insufficient documentation

## 2023-01-24 DIAGNOSIS — Z955 Presence of coronary angioplasty implant and graft: Secondary | ICD-10-CM | POA: Diagnosis not present

## 2023-01-24 DIAGNOSIS — Z8616 Personal history of COVID-19: Secondary | ICD-10-CM

## 2023-01-24 DIAGNOSIS — K449 Diaphragmatic hernia without obstruction or gangrene: Secondary | ICD-10-CM

## 2023-01-24 DIAGNOSIS — Z8546 Personal history of malignant neoplasm of prostate: Secondary | ICD-10-CM

## 2023-01-24 LAB — CMP (CANCER CENTER ONLY)
ALT: 39 U/L (ref 0–44)
AST: 24 U/L (ref 15–41)
Albumin: 3.1 g/dL — ABNORMAL LOW (ref 3.5–5.0)
Alkaline Phosphatase: 68 U/L (ref 38–126)
Anion gap: 6 (ref 5–15)
BUN: 16 mg/dL (ref 8–23)
CO2: 26 mmol/L (ref 22–32)
Calcium: 9 mg/dL (ref 8.9–10.3)
Chloride: 105 mmol/L (ref 98–111)
Creatinine: 0.77 mg/dL (ref 0.61–1.24)
GFR, Estimated: >60 mL/min (ref >60)
Glucose, Bld: 123 mg/dL — ABNORMAL HIGH (ref 70–99)
Potassium: 4 mmol/L (ref 3.5–5.1)
Sodium: 137 mmol/L (ref 135–145)
Total Bilirubin: 1.2 mg/dL (ref 0.3–1.2)
Total Protein: 5.4 g/dL — ABNORMAL LOW (ref 6.5–8.1)

## 2023-01-24 LAB — PROTIME-INR
INR: 2.6 — ABNORMAL HIGH (ref 0.8–1.2)
Prothrombin Time: 28.1 seconds — ABNORMAL HIGH (ref 11.4–15.2)

## 2023-01-24 LAB — CBC WITH DIFFERENTIAL (CANCER CENTER ONLY)
Abs Immature Granulocytes: 0.14 10*3/uL — ABNORMAL HIGH (ref 0.00–0.07)
Basophils Absolute: 0 10*3/uL (ref 0.0–0.1)
Basophils Relative: 0 %
Eosinophils Absolute: 0.3 10*3/uL (ref 0.0–0.5)
Eosinophils Relative: 2 %
HCT: 42.1 % (ref 39.0–52.0)
Hemoglobin: 14.2 g/dL (ref 13.0–17.0)
Immature Granulocytes: 1 %
Lymphocytes Relative: 4 %
Lymphs Abs: 0.6 10*3/uL — ABNORMAL LOW (ref 0.7–4.0)
MCH: 33.2 pg (ref 26.0–34.0)
MCHC: 33.7 g/dL (ref 30.0–36.0)
MCV: 98.4 fL (ref 80.0–100.0)
Monocytes Absolute: 0.7 10*3/uL (ref 0.1–1.0)
Monocytes Relative: 5 %
Neutro Abs: 11.2 10*3/uL — ABNORMAL HIGH (ref 1.7–7.7)
Neutrophils Relative %: 88 %
Platelet Count: 168 10*3/uL (ref 150–400)
RBC: 4.28 MIL/uL (ref 4.22–5.81)
RDW: 14.6 % (ref 11.5–15.5)
WBC Count: 12.9 10*3/uL — ABNORMAL HIGH (ref 4.0–10.5)
nRBC: 0 % (ref 0.0–0.2)

## 2023-01-24 LAB — MAGNESIUM: Magnesium: 2.3 mg/dL (ref 1.7–2.4)

## 2023-01-24 LAB — RESP PANEL BY RT-PCR (RSV, FLU A&B, COVID)  RVPGX2
Influenza A by PCR: NEGATIVE
Influenza B by PCR: NEGATIVE
Resp Syncytial Virus by PCR: NEGATIVE
SARS Coronavirus 2 by RT PCR: NEGATIVE

## 2023-01-24 LAB — EXPECTORATED SPUTUM ASSESSMENT W GRAM STAIN, RFLX TO RESP C

## 2023-01-24 LAB — CULTURE, BLOOD (ROUTINE X 2)

## 2023-01-24 LAB — MRSA NEXT GEN BY PCR, NASAL: MRSA by PCR Next Gen: NOT DETECTED

## 2023-01-24 LAB — TSH: TSH: 0.186 u[IU]/mL — ABNORMAL LOW (ref 0.350–4.500)

## 2023-01-24 LAB — PHOSPHORUS: Phosphorus: 2.8 mg/dL (ref 2.5–4.6)

## 2023-01-24 MED ORDER — METOPROLOL TARTRATE 5 MG/5ML IV SOLN
2.5000 mg | Freq: Once | INTRAVENOUS | Status: AC
  Administered 2023-01-24: 2.5 mg via INTRAVENOUS
  Filled 2023-01-24: qty 5

## 2023-01-24 MED ORDER — CHLORHEXIDINE GLUCONATE CLOTH 2 % EX PADS
6.0000 | MEDICATED_PAD | Freq: Every day | CUTANEOUS | Status: DC
  Administered 2023-01-24 – 2023-01-26 (×3): 6 via TOPICAL

## 2023-01-24 MED ORDER — LEVALBUTEROL HCL 1.25 MG/0.5ML IN NEBU: 1.2500 mg | INHALATION_SOLUTION | Freq: Four times a day (QID) | RESPIRATORY_TRACT | Status: DC | PRN

## 2023-01-24 MED ORDER — DILTIAZEM HCL-DEXTROSE 125-5 MG/125ML-% IV SOLN (PREMIX)
5.0000 mg/h | INTRAVENOUS | Status: DC
  Administered 2023-01-24: 5 mg/h via INTRAVENOUS
  Administered 2023-01-25: 10 mg/h via INTRAVENOUS
  Filled 2023-01-24 (×2): qty 125

## 2023-01-24 MED ORDER — PROMETHAZINE-DM 6.25-15 MG/5ML PO SYRP: 2.5000 mL | ORAL_SOLUTION | Freq: Three times a day (TID) | ORAL | Status: DC | PRN

## 2023-01-24 MED ORDER — ACYCLOVIR 200 MG PO CAPS
200.0000 mg | ORAL_CAPSULE | Freq: Two times a day (BID) | ORAL | Status: DC
  Administered 2023-01-24 – 2023-01-28 (×8): 200 mg via ORAL
  Filled 2023-01-24 (×9): qty 1

## 2023-01-24 MED ORDER — GUAIFENESIN-DM 100-10 MG/5ML PO SYRP
5.0000 mL | ORAL_SOLUTION | ORAL | Status: DC | PRN
  Administered 2023-01-25: 5 mL via ORAL
  Filled 2023-01-24: qty 10

## 2023-01-24 MED ORDER — ACETAMINOPHEN 650 MG RE SUPP: 650.0000 mg | Freq: Four times a day (QID) | RECTAL | Status: DC | PRN

## 2023-01-24 MED ORDER — NITROGLYCERIN 0.4 MG SL SUBL: 0.4000 mg | SUBLINGUAL_TABLET | SUBLINGUAL | Status: DC | PRN

## 2023-01-24 MED ORDER — ACETAMINOPHEN 325 MG PO TABS: 650.0000 mg | ORAL_TABLET | Freq: Four times a day (QID) | ORAL | Status: DC | PRN

## 2023-01-24 MED ORDER — WARFARIN SODIUM 5 MG PO TABS
5.0000 mg | ORAL_TABLET | Freq: Once | ORAL | Status: AC
  Administered 2023-01-24: 5 mg via ORAL
  Filled 2023-01-24: qty 1

## 2023-01-24 MED ORDER — LORATADINE 10 MG PO TABS
10.0000 mg | ORAL_TABLET | Freq: Every day | ORAL | Status: DC
  Administered 2023-01-25 – 2023-01-28 (×4): 10 mg via ORAL
  Filled 2023-01-24 (×4): qty 1

## 2023-01-24 MED ORDER — ONDANSETRON HCL 4 MG PO TABS: 4.0000 mg | ORAL_TABLET | Freq: Four times a day (QID) | ORAL | Status: DC | PRN

## 2023-01-24 MED ORDER — ATORVASTATIN CALCIUM 40 MG PO TABS
40.0000 mg | ORAL_TABLET | Freq: Every day | ORAL | Status: DC
  Administered 2023-01-24 – 2023-01-27 (×4): 40 mg via ORAL
  Filled 2023-01-24 (×4): qty 1

## 2023-01-24 MED ORDER — WARFARIN - PHARMACIST DOSING INPATIENT: Freq: Every day | Status: DC

## 2023-01-24 MED ORDER — METOPROLOL SUCCINATE ER 25 MG PO TB24
25.0000 mg | ORAL_TABLET | Freq: Every day | ORAL | Status: DC
  Administered 2023-01-25 – 2023-01-26 (×2): 25 mg via ORAL
  Filled 2023-01-24 (×2): qty 1

## 2023-01-24 MED ORDER — SODIUM CHLORIDE 0.9 % IV SOLN
500.0000 mg | Freq: Once | INTRAVENOUS | Status: AC
  Administered 2023-01-24: 500 mg via INTRAVENOUS
  Filled 2023-01-24: qty 5

## 2023-01-24 MED ORDER — SODIUM CHLORIDE 0.9 % IV SOLN
1.0000 g | Freq: Once | INTRAVENOUS | Status: AC
  Administered 2023-01-24: 1 g via INTRAVENOUS
  Filled 2023-01-24: qty 10

## 2023-01-24 MED ORDER — LACTATED RINGERS IV BOLUS
500.0000 mL | Freq: Once | INTRAVENOUS | Status: AC
  Administered 2023-01-24: 500 mL via INTRAVENOUS

## 2023-01-24 MED ORDER — PANTOPRAZOLE SODIUM 40 MG PO TBEC
40.0000 mg | DELAYED_RELEASE_TABLET | Freq: Every day | ORAL | Status: DC
  Administered 2023-01-25 – 2023-01-28 (×4): 40 mg via ORAL
  Filled 2023-01-24 (×4): qty 1

## 2023-01-24 MED ORDER — ONDANSETRON HCL 4 MG/2ML IJ SOLN: 4.0000 mg | Freq: Four times a day (QID) | INTRAMUSCULAR | Status: DC | PRN

## 2023-01-24 MED ORDER — SODIUM CHLORIDE 0.9 % IV SOLN
1.0000 g | INTRAVENOUS | Status: DC
  Administered 2023-01-25 – 2023-01-27 (×3): 1 g via INTRAVENOUS
  Filled 2023-01-24 (×4): qty 10

## 2023-01-24 MED ORDER — ORAL CARE MOUTH RINSE: 15.0000 mL | OROMUCOSAL | Status: DC | PRN

## 2023-01-24 MED ORDER — SODIUM CHLORIDE 0.9 % IV SOLN
500.0000 mg | INTRAVENOUS | Status: DC
  Administered 2023-01-25 – 2023-01-27 (×3): 500 mg via INTRAVENOUS
  Filled 2023-01-24 (×4): qty 5

## 2023-01-24 NOTE — Progress Notes (Signed)
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 Kathrynn Running.  CURRENT THERAPY: Systemic chemotherapy with daratumumab subcutaneously weekly in addition to Velcade 1.3 Mg/M2 on days 1, 4, 8, 11 every 3 weeks as well as Revlimid 25 mg p.o. daily for 14 days every 3 weeks and Decadron 20 mg p.o. weekly with chemotherapy.  First cycle expected on 12/05/2022.  Status post 2 cycles.  Starting from cycle #2 I will reduce the dose of Revlimid to 15 mg p.o. daily for 14 days every 3 weeks.  INTERVAL HISTORY: Ronald Duran 82 y.o. male returns to the clinic today for follow-up visit accompanied by his wife.  The patient continues to complain of fatigue and weakness.  He is also on atrial fibrillation.  He has some mild rash on his left foot.  He denied having any current chest pain, shortness of breath, but continues to have cough with postnasal drainage and no hemoptysis.  He has no nausea, vomiting, diarrhea or constipation.  The patient is here today for evaluation before starting day 8 of cycle #3.   MEDICAL HISTORY: Past Medical History:  Diagnosis Date   Atrial fibrillation (HCC)    CAD (coronary artery disease)    a. s/p MI with LAD stent placement December, 1999 -last stress test November 2008 with no EKG evidence of ischemia  b. cath 07/23/2015 after abnormal ETT and nuc, occluded mid LAD stent with good distal collateral, medical therapy    Cardiomyopathy (HCC)    Ischemic cardiomyopathy with EF 40% by echo 2005   COVID 2021   mild case   Dyslipidemia    Erectile dysfunction     GERD (gastroesophageal reflux disease)    Hiatal hernia    with esophageal strictures and dilatations in the past - Dr. [name]   History of nuclear stress test    Myoview 12/16: EF 47%, anterior, anteroseptal and inferoseptal defect suggesting infarct with peri-infarct ischemia, intermediate risk   HTN (hypertension)    Hx of seasonal allergies    Hyperlipidemia    Myocardial infarction (HCC) 08/01/1998   PAD (peripheral artery disease) (HCC)    Pre-diabetes    Prostate cancer (HCC)    with seed implant therapy following Lupron therapy in 2008 and 2009 - followed by Dr. Isabel Caprice   Ringworm    Stroke (HCC) 04/20/2017   seen on a MRI   Vitamin D deficiency     ALLERGIES:  has No Known Allergies.  MEDICATIONS:  Current Outpatient Medications  Medication Sig Dispense Refill   acyclovir (ZOVIRAX) 200 MG capsule Take 1 capsule (200 mg total) by mouth 2 (two) times daily. 60 capsule 5   atorvastatin (LIPITOR) 40 MG tablet Take 40 mg by mouth at bedtime.     B Complex Vitamins (B COMPLEX 100) tablet Take 1 tablet by mouth daily. Balance     cetirizine (ZYRTEC ALLERGY) 10 MG tablet Take 1 tablet (10 mg total) by mouth daily. 30 tablet 0   Cholecalciferol (VITAMIN D3) 2000 UNITS TABS Take 2,000 Units by mouth daily.  Coenzyme Q10 (CO Q-10) 100 MG CAPS Take 100 mg by mouth daily with supper.     dexamethasone (DECADRON) 4 MG tablet 5 tab weekly start with the first dose of chemotherapy. 40 tablet 3   lenalidomide (REVLIMID) 15 MG capsule Take 1 capsule (15 mg total) by mouth daily. Take for 14 days, then none for 7 days. Repeat every 21 days. Start Revlimid on day 1 of chemotherapy Auth #[id number] date obtained 12/19/2022 Adult Male. 14 capsule 0   metoprolol succinate (TOPROL XL) 25 MG 24 hr tablet Take 1 tablet (25 mg total) by mouth daily. 90 tablet 3   Multiple Vitamins-Minerals (CENTRUM) tablet Take 1 tablet by mouth daily.     nitroGLYCERIN (NITROSTAT) 0.4 MG SL tablet PLACE 1  TABLET UNDER THE TONGUE IF NEEDED EVERY 5 MINUTES FOR CHEST PAIN FOR 3 DOSES IF NO RELIEF AFTER FIRST DOSE CALL 911. 25 tablet 9   nystatin (MYCOSTATIN/NYSTOP) powder Apply 1 Application topically 3 (three) times daily. 30 g 0   omeprazole (PRILOSEC) 20 MG capsule Take 20 mg by mouth daily before breakfast.     prochlorperazine (COMPAZINE) 10 MG tablet Take 1 tablet (10 mg total) by mouth every 6 (six) hours as needed for nausea or vomiting. 30 tablet 1   promethazine-dextromethorphan (PROMETHAZINE-DM) 6.25-15 MG/5ML syrup Take 2.5 mLs by mouth 3 (three) times daily as needed for cough. 100 mL 0   triamcinolone ointment (KENALOG) 0.5 % Apply 1 Application topically 2 (two) times daily. 30 g 0   warfarin (COUMADIN) 5 MG tablet TAKE 1/2 TO 1 TABLET BY MOUTH DAILY AS DIRECTED BY COUMADIN CLINIC (Patient taking differently: Take 2.5-5 mg by mouth See admin instructions. Take 2.5 mg by mouth on Monday, Wednesday and Friday and take 5 mg on Tuesday, Thursday, Saturday and Sunday) 100 tablet 0   No current facility-administered medications for this visit.    SURGICAL HISTORY:  Past Surgical History:  Procedure Laterality Date   BRONCHIAL BIOPSY  11/15/2022   Procedure: BRONCHIAL BIOPSIES;  Surgeon: [name], DO;  Location: [facility] ENDOSCOPY;  Service: Pulmonary;;   BRONCHIAL BRUSHINGS  11/15/2022   Procedure: BRONCHIAL BRUSHINGS;  Surgeon: [name], DO;  Location: [facility] ENDOSCOPY;  Service: Pulmonary;;   BRONCHIAL NEEDLE ASPIRATION BIOPSY  11/15/2022   Procedure: BRONCHIAL NEEDLE ASPIRATION BIOPSIES;  Surgeon: [name], DO;  Location: [facility] ENDOSCOPY;  Service: Pulmonary;;   CARDIAC CATHETERIZATION N/A 07/23/2015   Procedure: Left Heart Cath and Coronary Angiography;  Surgeon: [name], MD;  Location: [facility] INVASIVE CV LAB;  Service: Cardiovascular;  Laterality: N/A;   CARDIOVERSION N/A 06/03/2022   Procedure: CARDIOVERSION;  Surgeon: [name], MD;  Location[facility] ENDOSCOPY;  Service:  Cardiovascular;  Laterality: N/A;   COLONOSCOPY WITH PROPOFOL N/A 11/24/2015   Procedure: COLONOSCOPY WITH PROPOFOL;  Surgeon: [name], MD;  Location: [facility] ENDOSCOPY;  Service: Endoscopy;  Laterality: N/A;   esophageal strictures with dilatations in the past per Dr. [name]     last '98   FIDUCIAL MARKER PLACEMENT  11/15/2022   Procedure: FIDUCIAL MARKER PLACEMENT;  Surgeon: [name], DO;  Location: [facility] ENDOSCOPY;  Service: Pulmonary;;   HERNIA REPAIR Left    INTRAMEDULLARY (IM) NAIL INTERTROCHANTERIC Left 10/25/2022   Procedure: LEFT INTRAMEDULLARY (IM) NAIL INTERTROCHANTERIC;  Surgeon: [name], MD;  Location: [facility] OR;  Service: Orthopedics;  Laterality: Left;   IR BONE MARROW BIOPSY & ASPIRATION  11/17/2022   IR BONE TUMOR(S)RF ABLATION  10/11/2022  IR KYPHO THORACIC WITH BONE BIOPSY  10/11/2022   LAD stent placement - Dr. [name]     left inguinal hernia repair - Dr. [name]     prostatic radioactive seed implantation - Dr. [name]     '09    REVIEW OF SYSTEMS:  A comprehensive review of systems was negative except for: Constitutional: positive for fatigue Respiratory: positive for cough Integument/breast: positive for rash   PHYSICAL EXAMINATION: General appearance: alert, cooperative, fatigued, and no distress Head: Normocephalic, without obvious abnormality, atraumatic Neck: no adenopathy, no JVD, supple, symmetrical, trachea midline, and thyroid not enlarged, symmetric, no tenderness/mass/nodules Lymph nodes: Cervical, supraclavicular, and axillary nodes normal. Resp: clear to auscultation bilaterally Back: symmetric, no curvature. ROM normal. No CVA tenderness. Cardio: regular rate and rhythm, S1, S2 normal, no murmur, click, rub or gallop GI: soft, non-tender; bowel sounds normal; no masses,  no organomegaly Extremities: extremities normal, atraumatic, no cyanosis or edema  ECOG PERFORMANCE STATUS: 1 - Symptomatic but completely ambulatory  Blood  pressure 112/79, pulse (!) 137, temperature 98 F (36.7 C), temperature source Oral, resp. rate 18, height 6' (1.829 m), weight 187 lb 6.4 oz (85 kg), SpO2 95 %.  LABORATORY DATA: Lab Results  Component Value Date   WBC 12.9 (H) 01/24/2023   HGB 14.2 01/24/2023   HCT 42.1 01/24/2023   MCV 98.4 01/24/2023   PLT 168 01/24/2023      Chemistry      Component Value Date/Time   NA 137 01/24/2023 0938   NA 141 08/25/2022 1440   K 4.0 01/24/2023 0938   CL 105 01/24/2023 0938   CO2 26 01/24/2023 0938   BUN 16 01/24/2023 0938   BUN 14 08/25/2022 1440   CREATININE 0.77 01/24/2023 0938   CREATININE 1.23 (H) 07/21/2015 0917      Component Value Date/Time   CALCIUM 9.0 01/24/2023 0938   ALKPHOS 68 01/24/2023 0938   AST 24 01/24/2023 0938   ALT 39 01/24/2023 0938   BILITOT 1.2 01/24/2023 0938       RADIOGRAPHIC STUDIES: DG Chest 2 View  Result Date: 01/14/2023 CLINICAL DATA:  Productive cough EXAM: CHEST - 2 VIEW COMPARISON:  Prior chest x-ray 11/15/2022 FINDINGS: Interval development of small bilateral pleural effusions. No focal airspace infiltrate. Diffuse mild bronchitic changes are similar compared to prior. Prior T10 cement augmentation. No acute osseous abnormality. IMPRESSION: Small bilateral pleural effusions are new compared to prior chest x-ray from 11/15/2022. No focal airspace infiltrate to suggest bacterial pneumonia. Diffuse mild bronchitic changes are similar compared to prior. Electronically Signed   By: Malachy Moan M.D.   On: 01/14/2023 12:55   XR FEMUR MIN 2 VIEWS LEFT  Result Date: 01/12/2023 X-rays demonstrate stable alignment of the hardware without interval change   ASSESSMENT AND PLAN: This is a very pleasant 82 years old white male with multiple myeloma presented with multiple lytic lesions involving T10, left femoral neck as well as T4, T9 and T11 vertebral bodies. Also had right lower lobe lung nodule that was suspicious for non-small cell lung  cancer. 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 The biopsy from the left hip was negative for malignancy. His myeloma panel showed no concerning findings except for elevated IgG but the serum light chains were normal. The patient underwent left hip fixation for impending fracture. He also underwent palliative radiotherapy under the care of Dr. Kathrynn Running.  His recent bone marrow biopsy and aspirate was consistent with plasma cell neoplasm with involvement of plasma cells 25% of the bone marrow. The patient is currently on treatment with daratumumab, Velcade, Revlimid and dexamethasone started on 12/05/2022 status post 2 cycles. He is here today for evaluation before starting day 8 of cycle #3.  The patient is feeling fine except for mild skin rash on the left foot but he has persistent atrial fibrillation with heart rate up to 140.   Repeat EKG showed atrial fibrillation with RVR with a heart rate of 140. Will cancel his treatment today and send the patient to the emergency department for evaluation and management of his atrial fibrillation by his cardiologist. Will resume his treatment next week if stable. The patient will come back for follow-up visit in 3 weeks for evaluation before his treatment. For the suspicious right lower lobe lung cancer, I the patient is expected to undergo SBRT under the care of Dr. Kathrynn Running. For anticoagulation, he is currently on Coumadin. He was advised to call immediately if he has any other concerning symptoms in the interval. The patient voices understanding of current disease status and treatment options and is in agreement with the current care plan.  All questions were answered. The patient knows to call the clinic with any problems, questions or concerns. We can certainly see the patient much sooner if necessary.  The total time spent in the  appointment was 30 minutes.  Disclaimer: This note was dictated with voice recognition software. Similar sounding words can inadvertently be transcribed and may not be corrected upon review.

## 2023-01-24 NOTE — ED Notes (Addendum)
234 pm Floor requesting 20 minutes for oncoming RN to take her current pt up to ICU.  3:12 PM report called to ICU, pt to be taken via RN to floor with monitor. Cardizem titrated up to 10mg /hr.

## 2023-01-24 NOTE — Telephone Encounter (Signed)
[name] with Dr. Asa Lente office states they are sending the patient to the ED for Afib to be evaluated. She says he is in no acute distress and just wanted to inform Dr. Izora Ribas.

## 2023-01-24 NOTE — ED Provider Notes (Signed)
[place] [facility] AT [facility] Provider Note   CSN: [id number] Arrival date & time: 01/24/23  1121     History  Chief Complaint  Patient presents with   afib with RVR    Ronald Duran is a 82 y.o. male.  HPI 82 year old male with a known history of A-fib currently treated with metoprolol and warfarin presents with atrial fibrillation.  He was due to get treatment for Multiple myeloma but his heart rate was too high.  He has been having cough for about 1 week.  Occasionally productive of clear sputum.  Went to urgent care 10 days ago and received some cough medicine which did not really help.  Does not feel short of breath and he has not had any chest pain.  No vomiting, fever, or other significant symptoms such as diarrhea.  He has been compliant with his meds, taking his metoprolol this morning.  Home Medications Prior to Admission medications   Medication Sig Start Date End Date Taking? Authorizing Provider  acyclovir (ZOVIRAX) 200 MG capsule Take 1 capsule (200 mg total) by mouth 2 (two) times daily. 11/23/22   Si Gaul, MD  atorvastatin (LIPITOR) 40 MG tablet Take 40 mg by mouth at bedtime. 04/22/17   [provider]  B Complex Vitamins (B COMPLEX 100) tablet Take 1 tablet by mouth daily. Balance    [provider]  cetirizine (ZYRTEC ALLERGY) 10 MG tablet Take 1 tablet (10 mg total) by mouth daily. 01/14/23   Wallis Bamberg, PA-C  Cholecalciferol (VITAMIN D3) 2000 UNITS TABS Take 2,000 Units by mouth daily.    [provider]  Coenzyme Q10 (CO Q-10) 100 MG CAPS Take 100 mg by mouth daily with supper.    [provider]  dexamethasone (DECADRON) 4 MG tablet 5 tab weekly start with the first dose of chemotherapy. 11/23/22   Si Gaul, MD  lenalidomide (REVLIMID) 15 MG capsule Take 1 capsule (15 mg total) by mouth daily. Take for 14 days, then none for 7 days. Repeat every 21 days. Start Revlimid on day 1 of  chemotherapy Auth #[id number] date obtained 12/19/2022 Adult Male. 12/28/22   Si Gaul, MD  metoprolol succinate (TOPROL XL) 25 MG 24 hr tablet Take 1 tablet (25 mg total) by mouth daily. 09/20/22   Swinyer, Zachary George, NP  Multiple Vitamins-Minerals (CENTRUM) tablet Take 1 tablet by mouth daily.    [provider]  nitroGLYCERIN (NITROSTAT) 0.4 MG SL tablet PLACE 1 TABLET UNDER THE TONGUE IF NEEDED EVERY 5 MINUTES FOR CHEST PAIN FOR 3 DOSES IF NO RELIEF AFTER FIRST DOSE CALL 911. 01/26/22   [name], MD  nystatin (MYCOSTATIN/NYSTOP) powder Apply 1 Application topically 3 (three) times daily. 01/10/23   Heilingoetter, Cassandra L, PA-C  omeprazole (PRILOSEC) 20 MG capsule Take 20 mg by mouth daily before breakfast. 06/28/22   [provider]  prochlorperazine (COMPAZINE) 10 MG tablet Take 1 tablet (10 mg total) by mouth every 6 (six) hours as needed for nausea or vomiting. 11/24/22   Si Gaul, MD  promethazine-dextromethorphan (PROMETHAZINE-DM) 6.25-15 MG/5ML syrup Take 2.5 mLs by mouth 3 (three) times daily as needed for cough. 01/14/23   Wallis Bamberg, PA-C  triamcinolone ointment (KENALOG) 0.5 % Apply 1 Application topically 2 (two) times daily. 12/19/22   Walisiewicz, Yvonna Alanis E, PA-C  warfarin (COUMADIN) 5 MG tablet TAKE 1/2 TO 1 TABLET BY MOUTH DAILY AS DIRECTED BY COUMADIN CLINIC Patient taking differently: Take 2.5-5 mg by mouth See  admin instructions. Take 2.5 mg by mouth on Monday, Wednesday and Friday and take 5 mg on Tuesday, Thursday, Saturday and Sunday 09/21/22   Jake Bathe, MD      Allergies    Patient has no known allergies.    Review of Systems   Review of Systems  Constitutional:  Positive for fatigue. Negative for fever.  Respiratory:  Positive for cough. Negative for shortness of breath.   Cardiovascular:  Negative for chest pain and palpitations.  Gastrointestinal:  Negative for abdominal pain and vomiting.    Physical Exam Updated Vital  Signs BP 103/80   Pulse 92   Temp 97.9 F (36.6 C) (Oral)   Resp 13   Wt 84.8 kg   SpO2 92%   BMI 25.36 kg/m  Physical Exam Vitals and nursing note reviewed.  Constitutional:      General: He is not in acute distress.    Appearance: He is well-developed. He is not ill-appearing or diaphoretic.  HENT:     Head: Normocephalic and atraumatic.  Cardiovascular:     Rate and Rhythm: Tachycardia present. Rhythm irregular.     Heart sounds: Normal heart sounds.  Pulmonary:     Effort: Pulmonary effort is normal.     Breath sounds: Normal breath sounds. No wheezing, rhonchi or rales.  Abdominal:     General: There is no distension.     Palpations: Abdomen is soft.     Tenderness: There is no abdominal tenderness.  Skin:    General: Skin is warm and dry.  Neurological:     Mental Status: He is alert.     ED Results / Procedures / Treatments   Labs (all labs ordered are listed, but only abnormal results are displayed) Labs Reviewed  PROTIME-INR - Abnormal; Notable for the following components:      Result Value   Prothrombin Time 28.1 (*)    INR 2.6 (*)    All other components within normal limits  RESP PANEL BY RT-PCR (RSV, FLU A&B, COVID)  [id number]  CULTURE, BLOOD (ROUTINE X 2)  CULTURE, BLOOD (ROUTINE X 2)  MAGNESIUM  PHOSPHORUS    EKG None  Radiology DG Chest Portable 1 View  Result Date: 01/24/2023 CLINICAL DATA:  cough EXAM: PORTABLE CHEST - 1 VIEW COMPARISON:  01/14/2023 FINDINGS: Patchy interstitial and airspace opacities in both lung bases, new since previous. Blunting of the lateral costophrenic angles. Heart size upper limits normal. Aortic Atherosclerosis (ICD10-170.0). Visualized bones unremarkable. IMPRESSION: Patchy bibasilar infiltrates and small effusions. Electronically Signed   By: Corlis Leak M.D.   On: 01/24/2023 12:56    Procedures .Critical Care  Performed by: Pricilla Loveless, MD Authorized by: Pricilla Loveless, MD   Critical care provider  statement:    Critical care time (minutes):  35   Critical care time was exclusive of:  Separately billable procedures and treating other patients   Critical care was necessary to treat or prevent imminent or life-threatening deterioration of the following conditions:  Circulatory failure   Critical care was time spent personally by me on the following activities:  Development of treatment plan with patient or surrogate, discussions with consultants, evaluation of patient's response to treatment, examination of patient, ordering and review of laboratory studies, ordering and review of radiographic studies, ordering and performing treatments and interventions, pulse oximetry, re-evaluation of patient's condition and review of old charts     Medications Ordered in ED Medications  cefTRIAXone (ROCEPHIN) 1 g in sodium chloride 0.9 %  100 mL IVPB (1 g Intravenous New Bag/Given 01/24/23 1419)  azithromycin (ZITHROMAX) 500 mg in sodium chloride 0.9 % 250 mL IVPB (has no administration in time range)  diltiazem (CARDIZEM) 125 mg in dextrose 5% 125 mL (1 mg/mL) infusion (5 mg/hr Intravenous New Bag/Given 01/24/23 1421)  lactated ringers bolus 500 mL (0 mLs Intravenous Stopped 01/24/23 1405)  metoprolol tartrate (LOPRESSOR) injection 2.5 mg (2.5 mg Intravenous Given 01/24/23 1204)  metoprolol tartrate (LOPRESSOR) injection 2.5 mg (2.5 mg Intravenous Given 01/24/23 1246)    ED Course/ Medical Decision Making/ A&P                             Medical Decision Making Amount and/or Complexity of Data Reviewed Independent Historian: spouse Labs: ordered.    Details: Mild leukocytosis.  INR therapeutic Radiology: ordered and independent interpretation performed.    Details: Mild infiltrates ECG/medicine tests: ordered and independent interpretation performed.    Details: Atrial fibrillation with RVR.  With  Risk Prescription drug management. Decision regarding hospitalization.   Patient's x-ray shows  patchy infiltrates consistent with some pneumonia.  He is not ill-appearing but I suspect pneumonia has driven him into A-fib with RVR.  Had some mild response to some small boluses of metoprolol but cannot be too aggressive is also borderline hypotensive.  Has not outright been hypotensive but will give some fluids and put on a diltiazem drip.  He will need admission for better heart rate control.  At times his heart rate has normalized but often is up in the 120s now.  Given Rocephin and azithromycin for potential pneumonia.  COVID and flu are negative.  Discussed with Dr. Robb Matar for admission.        Final Clinical Impression(s) / ED Diagnoses Final diagnoses:  Atrial fibrillation with RVR (HCC)  Community acquired pneumonia, unspecified laterality    Rx / DC Orders ED Discharge Orders     None         Pricilla Loveless, MD 01/24/23 1451

## 2023-01-24 NOTE — Progress Notes (Signed)
ANTICOAGULATION CONSULT NOTE - Initial Consult  Pharmacy Consult for warfarin Indication: atrial fibrillation  No Known Allergies  Patient Measurements: Height: 6' (182.9 cm) Weight: 83.8 kg (184 lb 11.9 oz) IBW/kg (Calculated) : 77.6  Vital Signs: Temp: 97.9 F (36.6 C) (06/18 1600) Temp Source: Oral (06/18 1600) BP: 101/78 (06/18 1600) Pulse Rate: 105 (06/18 1600)  Labs: Recent Labs    01/24/23 0938 01/24/23 1208  HGB 14.2  --   HCT 42.1  --   PLT 168  --   LABPROT  --  28.1*  INR  --  2.6*  CREATININE 0.77  --     Estimated Creatinine Clearance: 82.2 mL/min (by C-G formula based on SCr of 0.77 mg/dL).   Medical History: Past Medical History:  Diagnosis Date   Atrial fibrillation (HCC)    CAD (coronary artery disease)    a. s/p MI with LAD stent placement December, 1999 -last stress test November 2008 with no EKG evidence of ischemia  b. cath 07/23/2015 after abnormal ETT and nuc, occluded mid LAD stent with good distal collateral, medical therapy    Cardiomyopathy (HCC)    Ischemic cardiomyopathy with EF 40% by echo 2005   COVID 2021   mild case   Dyslipidemia    Erectile dysfunction    GERD (gastroesophageal reflux disease)    Hiatal hernia    with esophageal strictures and dilatations in the past - Dr. [name]   History of nuclear stress test    Myoview 12/16: EF 47%, anterior, anteroseptal and inferoseptal defect suggesting infarct with peri-infarct ischemia, intermediate risk   HTN (hypertension)    Hx of seasonal allergies    Hyperlipidemia    Myocardial infarction (HCC) 08/01/1998   PAD (peripheral artery disease) (HCC)    Pre-diabetes    Prostate cancer (HCC)    with seed implant therapy following Lupron therapy in 2008 and 2009 - followed by Dr. Isabel Caprice   Ringworm    Stroke (HCC) 04/20/2017   seen on a MRI   Vitamin D deficiency     Medications:  Prior to admission medications include warfarin Last dose: 6/17 Per [facility] Clinic: INR  Goal: 2-3 Warfarin regimen: 2.5 mg every Mon, Wed, Fri; 5 mg all other days  Assessment: Pharmacy consulted to dose warfarin for this 82 yo male admitted with afib RVR and CAP sent from cancer center where he is being treated for multiple myeloma.  History includes embolic stroke and afib on long term use of warfarin.    Today, 01/24/23 INR therapeutic at 2.6.  Goal of Therapy:  INR 2-3 Monitor platelets by anticoagulation protocol: Yes   Plan:  Warfarin 5 mg po today as per patient's home regimen Daily INR for warfarin dose Monitor CBC, signs/symptoms of bleeding    Thank you for allowing pharmacy to be a part of this patient's care.  Selinda Eon, PharmD, BCPS Clinical Pharmacist [facility] Please utilize Amion for appropriate phone number to reach the unit pharmacist [facility]) 01/24/2023 4:38 PM

## 2023-01-24 NOTE — H&P (Signed)
History and Physical    Patient: Ronald Duran:[id number] DOB: [birth year] DOA: 01/24/2023 DOS: the patient was seen and examined on 01/24/2023 PCP: [name], MD  Patient coming from: Home  Chief Complaint:  Chief Complaint  Patient presents with   afib with RVR   HPI: Ronald Duran is a 82 y.o. 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   No chest pain, palpitations, diaphoresis, PND, orthopnea or pitting edema of the lower extremities. Also, the patient stated that he has been having URI symptoms since last week with rhinorrhea, postnasal drip, cough with progressively worse fatigue. He denied fever, chills, wheezing or hemoptysis.  No abdominal pain, nausea, emesis, diarrhea, constipation, melena or hematochezia.  No flank pain, dysuria, frequency or hematuria.  No polyuria, polydipsia, polyphagia or blurred vision.   Lab work: CBC showed a white count 12.9 with 88% neutrophils, hemoglobin 14.2 g deciliter platelets 168.  CMP showed a glucose of 123 mg/dL, albumin 3.1 and total protein 5.4 g/dL.  PT is 28.1 and INR 2.6.  Imaging: Portable 1 view chest radiographs show patchy bibasilar infiltrates and small effusions.  ED course: Initial vital signs were temperature 97.9 F, pulse 140, respiration 14, BP 105/86 mmHg O2 sat 95% on room air.  The patient received LR 500 mL bolus, metoprolol 2.5 mg IVP x 2, ceftriaxone 1 g IVPB,  azithromycin 500 mg IVPB and was started on a diltiazem infusion.  Review of Systems: As mentioned in the history of present illness. All other systems reviewed and are negative. Past Medical History:  Diagnosis Date   Atrial fibrillation (HCC)    CAD (coronary artery disease)    a. s/p MI with LAD stent placement December, 1999 -last stress test November 2008 with no EKG evidence of ischemia  b. cath 07/23/2015 after abnormal ETT and nuc, occluded mid LAD stent with good distal collateral, medical therapy    Cardiomyopathy (HCC)    Ischemic cardiomyopathy with EF 40% by echo 2005   COVID 2021   mild case   Dyslipidemia    Erectile dysfunction    GERD (gastroesophageal reflux disease)    Hiatal hernia    with esophageal strictures and dilatations in the past - Dr. [name]   History of nuclear stress test    Myoview 12/16: EF 47%, anterior, anteroseptal and inferoseptal defect suggesting infarct with peri-infarct ischemia, intermediate risk   HTN (hypertension)    Hx of seasonal allergies    Hyperlipidemia    Myocardial infarction (HCC) 08/01/1998   PAD (peripheral artery disease) (HCC)    Pre-diabetes    Prostate cancer (HCC)    with seed implant therapy following Lupron therapy in 2008 and 2009 - followed by Dr. Pollyann Glen    Stroke [facility]) 04/20/2017   seen on a MRI   Vitamin D deficiency    Past Surgical History:  Procedure Laterality Date   BRONCHIAL BIOPSY  11/15/2022   Procedure: BRONCHIAL BIOPSIES;  Surgeon: [name], DO;  Location: [facility] ENDOSCOPY;  Service: Pulmonary;;   BRONCHIAL BRUSHINGS  11/15/2022   Procedure: BRONCHIAL BRUSHINGS;  Surgeon: [name], DO;  Location: [facility] ENDOSCOPY;  Service: Pulmonary;;   BRONCHIAL NEEDLE ASPIRATION BIOPSY  11/15/2022   Procedure: BRONCHIAL NEEDLE ASPIRATION BIOPSIES;  Surgeon: [name], DO;  Location: [facility] ENDOSCOPY;  Service: Pulmonary;;   CARDIAC CATHETERIZATION N/A 07/23/2015   Procedure: Left Heart Cath  and Coronary Angiography;  Surgeon: [name], MD;  Location: [facility] INVASIVE CV LAB;  Service: Cardiovascular;  Laterality: N/A;   CARDIOVERSION N/A 06/03/2022   Procedure: CARDIOVERSION;  Surgeon: [name], MD;  Location: [facility] ENDOSCOPY;  Service: Cardiovascular;  Laterality: N/A;   COLONOSCOPY WITH PROPOFOL N/A 11/24/2015   Procedure: COLONOSCOPY WITH PROPOFOL;  Surgeon: [name], MD;  Location: [facility] ENDOSCOPY;  Service: Endoscopy;  Laterality: N/A;   esophageal strictures with dilatations in the past per Dr. [name]     last '98   FIDUCIAL MARKER PLACEMENT  11/15/2022   Procedure: FIDUCIAL MARKER PLACEMENT;  Surgeon: [name], DO;  Location: [facility] ENDOSCOPY;  Service: Pulmonary;;   HERNIA REPAIR Left    INTRAMEDULLARY (IM) NAIL INTERTROCHANTERIC Left 10/25/2022   Procedure: LEFT INTRAMEDULLARY (IM) NAIL INTERTROCHANTERIC;  Surgeon: [name], MD;  Location: [facility] OR;  Service: Orthopedics;  Laterality: Left;   IR BONE MARROW BIOPSY & ASPIRATION  11/17/2022   IR BONE TUMOR(S)RF ABLATION  10/11/2022   IR KYPHO THORACIC WITH BONE BIOPSY  10/11/2022   LAD stent placement - Dr. [name]     left inguinal hernia repair - Dr. [name]     prostatic radioactive seed implantation - Dr. [name]     '09   Social History:  reports that he has never smoked. He has been exposed to tobacco smoke. He has never used smokeless tobacco. He reports that he does not currently use alcohol. He reports that he does not use drugs.  No Known Allergies  Family History  Problem Relation Age of Onset   CVA Mother    CVA Father 58   CAD Sister     Prior to Admission medications   Medication Sig Start Date End Date Taking? Authorizing Provider  acyclovir (ZOVIRAX) 200 MG capsule Take 1 capsule (200 mg total) by mouth 2 (two) times daily. 11/23/22   Si Gaul, MD  atorvastatin (LIPITOR) 40 MG tablet Take 40 mg by mouth at bedtime. 04/22/17   [provider]  B Complex Vitamins  (B COMPLEX 100) tablet Take 1 tablet by mouth daily. Balance    [provider]  cetirizine (ZYRTEC ALLERGY) 10 MG tablet Take 1 tablet (10 mg total) by mouth daily. 01/14/23   Wallis Bamberg, PA-C  Cholecalciferol (VITAMIN D3) 2000 UNITS TABS Take 2,000 Units by mouth daily.    [provider]  Coenzyme Q10 (CO Q-10) 100 MG CAPS Take 100 mg by mouth daily with supper.    [provider]  dexamethasone (DECADRON) 4 MG tablet 5 tab weekly start with the first dose of chemotherapy. 11/23/22   Si Gaul, MD  lenalidomide (REVLIMID) 15 MG capsule Take 1 capsule (15 mg total) by mouth daily. Take for 14 days, then none for 7 days. Repeat every 21 days. Start Revlimid on day 1 of chemotherapy Auth #[id number] date obtained 12/19/2022 Adult Male. 12/28/22   Si Gaul, MD  metoprolol succinate (TOPROL XL) 25 MG 24 hr tablet Take 1 tablet (25 mg total) by mouth daily. 09/20/22  Swinyer, Zachary George, NP  Multiple Vitamins-Minerals (CENTRUM) tablet Take 1 tablet by mouth daily.    [provider]  nitroGLYCERIN (NITROSTAT) 0.4 MG SL tablet PLACE 1 TABLET UNDER THE TONGUE IF NEEDED EVERY 5 MINUTES FOR CHEST PAIN FOR 3 DOSES IF NO RELIEF AFTER FIRST DOSE CALL 911. 01/26/22   [name], MD  nystatin (MYCOSTATIN/NYSTOP) powder Apply 1 Application topically 3 (three) times daily. 01/10/23   Heilingoetter, Cassandra L, PA-C  omeprazole (PRILOSEC) 20 MG capsule Take 20 mg by mouth daily before breakfast. 06/28/22   [provider]  prochlorperazine (COMPAZINE) 10 MG tablet Take 1 tablet (10 mg total) by mouth every 6 (six) hours as needed for nausea or vomiting. 11/24/22   Si Gaul, MD  promethazine-dextromethorphan (PROMETHAZINE-DM) 6.25-15 MG/5ML syrup Take 2.5 mLs by mouth 3 (three) times daily as needed for cough. 01/14/23   Wallis Bamberg, PA-C  triamcinolone ointment (KENALOG) 0.5 % Apply 1 Application topically 2 (two) times daily. 12/19/22   Walisiewicz,  Yvonna Alanis E, PA-C  warfarin (COUMADIN) 5 MG tablet TAKE 1/2 TO 1 TABLET BY MOUTH DAILY AS DIRECTED BY COUMADIN CLINIC Patient taking differently: Take 2.5-5 mg by mouth See admin instructions. Take 2.5 mg by mouth on Monday, Wednesday and Friday and take 5 mg on Tuesday, Thursday, Saturday and Sunday 09/21/22   Jake Bathe, MD    Physical Exam: Vitals:   01/24/23 1200 01/24/23 1230 01/24/23 1300 01/24/23 1345  BP: 95/83 103/80    Pulse: (!) 125 (!) 108 98 (!) 106  Resp: 19 (!) 23 19 (!) 25  Temp:      TempSrc:      SpO2: 93% 95% 95% 94%  Weight:       Physical Exam Vitals and nursing note reviewed.  Constitutional:      General: He is awake. He is not in acute distress.    Appearance: Normal appearance.  HENT:     Head: Normocephalic.     Nose: No rhinorrhea.     Mouth/Throat:     Mouth: Mucous membranes are moist.  Eyes:     General: No scleral icterus.    Pupils: Pupils are equal, round, and reactive to light.  Neck:     Vascular: No JVD.  Cardiovascular:     Rate and Rhythm: Tachycardia present. Rhythm irregular.     Heart sounds: S1 normal and S2 normal.     Comments: Trace edema right lower extremity. Pulmonary:     Effort: Pulmonary effort is normal. No tachypnea or accessory muscle usage.     Breath sounds: Examination of the right-lower field reveals rales. Examination of the left-lower field reveals rales. Rhonchi and rales present. No wheezing.  Abdominal:     General: Bowel sounds are normal. There is no distension.     Palpations: Abdomen is soft.     Tenderness: There is no abdominal tenderness.  Musculoskeletal:     Cervical back: Neck supple.     Right lower leg: Edema present.     Left lower leg: 1+ Edema present.  Neurological:     General: No focal deficit present.     Mental Status: He is alert and oriented to person, place, and time.  Psychiatric:        Mood and Affect: Mood normal.        Behavior: Behavior normal. Behavior is cooperative.      Data Reviewed:  There are no new results to review at this time.  10/21/2021  transthoracic echocardiogram. IMPRESSIONS    1. Left ventricular ejection fraction, by estimation, is 45 to 50%. The  left ventricle has mildly decreased function. The left ventricle  demonstrates regional wall motion abnormalities (see scoring  diagram/findings for description). Left ventricular  diastolic parameters are indeterminate. There is moderate hypokinesis of  the left ventricular, mid-apical inferior wall. There is of the left  ventricular, apical apical segment.   2. Right ventricular systolic function is normal. The right ventricular  size is normal. There is normal pulmonary artery systolic pressure.   3. Left atrial size was moderately dilated.   4. Right atrial size was mild to moderately dilated.   5. The mitral valve is normal in structure. Trivial mitral valve  regurgitation. No evidence of mitral stenosis.   6. The aortic valve is tricuspid. There is mild calcification of the  aortic valve. Aortic valve regurgitation is trivial. Aortic valve  sclerosis is present, with no evidence of aortic valve stenosis.   7. The inferior vena cava is normal in size with greater than 50%  respiratory variability, suggesting right atrial pressure of 3 mmHg.   Assessment and Plan: Principal Problem:   Chronic atrial fibrillation with RVR (HCC) Observation/SDU. Continue warfarin per pharmacy. Continue diltiazem infusion. Continue metoprolol ER 25 mg p.o. daily. Keep electrolytes optimized. Check echocardiogram in a.m.  Active Problems:   CAP (community acquired pneumonia) As needed supplemental oxygen. As needed bronchodilators. Continue ceftriaxone 1 g IVPB daily. Continue azithromycin 500 mg IVPB daily. Check strep pneumoniae urinary antigen. Check sputum Gram stain, culture and sensitivity. Follow-up blood culture and sensitivity. Follow-up CBC and chemistry in the morning.     Essential hypertension Continue metoprolol as above. Monitor blood pressure and heart rate.    Hiatal hernia   Esophageal stricture Continue omeprazole or formulary equivalent.    Chronic systolic heart failure (HCC) No signs of decompensation at this time. Continue beta-blocker. Might benefit from ARB/ACE.    Dyslipidemia Continue atorvastatin 40 mg p.o. daily.    CAD (coronary artery disease) Continue statin, beta-blocker, AC and NTG PRN.    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 Arbutus Ped at the cancer center.      Advance Care Planning:   Code Status: Full Code   Consults:   Family Communication: His spouse was at bedside.  Severity of Illness: The appropriate patient status for this patient is INPATIENT. Inpatient status is judged to be reasonable and necessary in order to provide the required intensity of service to ensure the patient's safety. The patient's presenting symptoms, physical exam findings, and initial radiographic and laboratory data in the context of their chronic comorbidities is felt to place them at high risk for further clinical deterioration. Furthermore, it is not anticipated that the patient will be medically stable for discharge from the hospital within 2 midnights of admission.   * I certify that at the point of admission it is my clinical judgment that the patient will require inpatient hospital care spanning beyond 2 midnights from the point of admission due to high intensity of service, high risk for further deterioration and high frequency of surveillance required.*  Author: Bobette Mo, MD 01/24/2023 2:18 PM  For on call review [email or web address].   This document was prepared using Dragon voice recognition software and may contain some unintended transcription errors.

## 2023-01-24 NOTE — Progress Notes (Signed)
Pt transported to ED in wheelchair accompanied by this nurse and pt wife.. Report given to Gerilyn Pilgrim in ED / Mr. Buckalew was placed in exam room 14.

## 2023-01-24 NOTE — ED Triage Notes (Signed)
Pt arrives from cancer center being treated for multiple myeloma with concerns for afib RVR. Pt endorses recent bronchiolitis, and cough.  Hx of HA 1999 and stroke 2018. Hx of afib, on warfarin.

## 2023-01-25 ENCOUNTER — Inpatient Hospital Stay (HOSPITAL_COMMUNITY): Payer: Medicare Other

## 2023-01-25 DIAGNOSIS — J189 Pneumonia, unspecified organism: Secondary | ICD-10-CM | POA: Diagnosis not present

## 2023-01-25 DIAGNOSIS — I482 Chronic atrial fibrillation, unspecified: Secondary | ICD-10-CM | POA: Diagnosis not present

## 2023-01-25 DIAGNOSIS — I5022 Chronic systolic (congestive) heart failure: Secondary | ICD-10-CM

## 2023-01-25 DIAGNOSIS — I10 Essential (primary) hypertension: Secondary | ICD-10-CM

## 2023-01-25 DIAGNOSIS — K449 Diaphragmatic hernia without obstruction or gangrene: Secondary | ICD-10-CM

## 2023-01-25 DIAGNOSIS — I4891 Unspecified atrial fibrillation: Secondary | ICD-10-CM

## 2023-01-25 DIAGNOSIS — C9000 Multiple myeloma not having achieved remission: Secondary | ICD-10-CM

## 2023-01-25 LAB — PROTIME-INR
INR: 2.9 — ABNORMAL HIGH (ref 0.8–1.2)
Prothrombin Time: 30.8 seconds — ABNORMAL HIGH (ref 11.4–15.2)

## 2023-01-25 LAB — COMPREHENSIVE METABOLIC PANEL
ALT: 47 U/L — ABNORMAL HIGH (ref 0–44)
AST: 28 U/L (ref 15–41)
Albumin: 2.7 g/dL — ABNORMAL LOW (ref 3.5–5.0)
Alkaline Phosphatase: 62 U/L (ref 38–126)
Anion gap: 9 (ref 5–15)
BUN: 22 mg/dL (ref 8–23)
CO2: 22 mmol/L (ref 22–32)
Calcium: 8.7 mg/dL — ABNORMAL LOW (ref 8.9–10.3)
Chloride: 103 mmol/L (ref 98–111)
Creatinine, Ser: 0.76 mg/dL (ref 0.61–1.24)
GFR, Estimated: >60 mL/min (ref >60)
Glucose, Bld: 151 mg/dL — ABNORMAL HIGH (ref 70–99)
Potassium: 4.4 mmol/L (ref 3.5–5.1)
Sodium: 134 mmol/L — ABNORMAL LOW (ref 135–145)
Total Bilirubin: 1 mg/dL (ref 0.3–1.2)
Total Protein: 5.4 g/dL — ABNORMAL LOW (ref 6.5–8.1)

## 2023-01-25 LAB — CBC
HCT: 38.3 % — ABNORMAL LOW (ref 39.0–52.0)
Hemoglobin: 12.6 g/dL — ABNORMAL LOW (ref 13.0–17.0)
MCH: 33.2 pg (ref 26.0–34.0)
MCHC: 32.9 g/dL (ref 30.0–36.0)
MCV: 101.1 fL — ABNORMAL HIGH (ref 80.0–100.0)
Platelets: 169 10*3/uL (ref 150–400)
RBC: 3.79 MIL/uL — ABNORMAL LOW (ref 4.22–5.81)
RDW: 14.6 % (ref 11.5–15.5)
WBC: 15.1 10*3/uL — ABNORMAL HIGH (ref 4.0–10.5)
nRBC: 0 % (ref 0.0–0.2)

## 2023-01-25 LAB — ECHOCARDIOGRAM COMPLETE
Area-P 1/2: 4.39 cm2
Calc EF: 24.1 %
Height: 72 in
S' Lateral: 5.2 cm
Single Plane A2C EF: 31.5 %
Single Plane A4C EF: 13 %
Weight: 2955.93 oz

## 2023-01-25 LAB — CULTURE, RESPIRATORY W GRAM STAIN

## 2023-01-25 LAB — CULTURE, BLOOD (ROUTINE X 2): Special Requests: ADEQUATE

## 2023-01-25 LAB — STREP PNEUMONIAE URINARY ANTIGEN: Strep Pneumo Urinary Antigen: POSITIVE — AB

## 2023-01-25 MED ORDER — PERFLUTREN LIPID MICROSPHERE
1.0000 mL | INTRAVENOUS | Status: AC | PRN
  Administered 2023-01-25: 2 mL via INTRAVENOUS

## 2023-01-25 MED ORDER — WARFARIN SODIUM 2.5 MG PO TABS
2.5000 mg | ORAL_TABLET | Freq: Once | ORAL | Status: AC
  Administered 2023-01-25: 2.5 mg via ORAL
  Filled 2023-01-25: qty 1

## 2023-01-25 NOTE — TOC Progression Note (Signed)
Transition of Care [facility]) - Progression Note    Patient Details  Name: Ronald Duran MRN: [id number] Date of Birth: [birth year]  Transition of Care [facility]) CM/SW Contact  Erin Sons, [place] Phone Number: 01/25/2023, 2:05 PM  Clinical Narrative:      Transition of Care [facility]) - Inpatient Brief Assessment   Patient Details  Name: Ronald Duran MRN: [id number] Date of Birth: [birth year]  Transition of Care [facility]) CM/SW Contact:    Erin Sons, LCSW Phone Number: 01/25/2023, 2:05 PM   Clinical Narrative:  Brief assessment completed. TOC will follow for potential DC needs.   Transition of Care Asessment: Insurance and Status: Insurance coverage has been reviewed Patient has primary care physician: Yes Home environment has been reviewed: Yes Prior level of function:: Modified Ind. Prior/Current Home Services: No current home services     Transition of care needs: transition of care needs identified, TOC will continue to follow        Social Determinants of Health (SDOH) Interventions SDOH Screenings   Food Insecurity: No Food Insecurity (01/24/2023)  Housing: Low Risk  (01/24/2023)  Transportation Needs: Unmet Transportation Needs (01/24/2023)  Utilities: Not At Risk (01/24/2023)  Depression (PHQ2-9): Low Risk  (10/12/2022)  Tobacco Use: Low Risk  (01/24/2023)    Readmission Risk Interventions    01/25/2023    2:03 PM  Readmission Risk Prevention Plan  PCP or Specialist Appt within 3-5 Days Complete

## 2023-01-25 NOTE — Progress Notes (Signed)
[facility]  PROGRESS NOTE  Ronald ZHAO WUJ:[id number] DOB: 1941-05-03 DOA: 01/24/2023 PCP: [name], MD   Brief HPI:    82 y.o. 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  Portable 1 view chest radiographs show patchy bibasilar infiltrates and small effusions.    Assessment/Plan:   Community-acquired pneumonia -Started on ceftriaxone and Zithromax -Strep pneumo urinary antigen is positive -Sputum culture growing gram-positive cocci and gram-negative rods -Blood cultures x 2 is negative to date  Hypertension -Blood pressure is stable -Continue metoprolol 25 mg p.o. daily  Hyperlipidemia -Continue atorvastatin  Paroxysmal atrial fibrillation with RVR -Started on Cardizem gtt. 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  Failure of warfarin; will consult cardiology for further recommendations  Hiatal hernia/esophageal stricture -Continue Protonix 40 mg daily  CAD  -Continue atorvastatin, metoprolol  Chronic systolic heart failure -No signs symptoms of decompensation at this time -Continue metoprolol, will likely benefit from ACE  inhibitor/ARB  Multiple myeloma -Patient followed by Dr. Arbutus Ped -Currently on chemotherapy with daratumumab, Velcade, Revlimid and dexamethasone started on 12/05/2022 status post 2 cycle   Non-small cell lung cancer -CT chest showed right lower lobe lung nodule suspicious for non-small cell lung cancer -S/p radiation 2 weeks ago   Medications     acyclovir  200 mg Oral BID   atorvastatin  40 mg Oral QHS   Chlorhexidine Gluconate Cloth  6 each Topical Daily   loratadine  10 mg Oral Daily   metoprolol succinate  25 mg Oral Daily   pantoprazole  40 mg Oral Daily   Warfarin - Pharmacist Dosing Inpatient   Does not apply q1600     Data Reviewed:   CBG:  No results for input(s): "GLUCAP" in the last 168 hours.  SpO2: 91 %    Vitals:   01/25/23 0100 01/25/23 0200 01/25/23 0418 01/25/23 0426  BP:   111/69   Pulse: 65 67 72 95  Resp:   (!) 21 20  Temp:    97.9 F (36.6 C)  TempSrc:    Oral  SpO2:   90% 91%  Weight:      Height:          Data Reviewed:  Basic Metabolic Panel: Recent Labs  Lab 01/18/23 0851 01/24/23 0938 01/24/23 1552 01/25/23 0325  NA 137 137  --  134*  K 4.3 4.0  --  4.4  CL 104 105  --  103  CO2 25 26  --  22  GLUCOSE 204* 123*  --  151*  BUN 23 16  --  22  CREATININE 0.84 0.77  --  0.76  CALCIUM 9.6 9.0  --  8.7*  MG  --   --  2.3  --   PHOS  --   --  2.8  --  CBC: Recent Labs  Lab 01/18/23 0851 01/24/23 0938 01/25/23 0325  WBC 11.4* 12.9* 15.1*  NEUTROABS 10.5* 11.2*  --   HGB 14.0 14.2 12.6*  HCT 39.8 42.1 38.3*  MCV 98.0 98.4 101.1*  PLT 129* 168 169    LFT Recent Labs  Lab 01/18/23 0851 01/24/23 0938 01/25/23 0325  AST 23 24 28   ALT 26 39 47*  ALKPHOS 69 68 62  BILITOT 1.5* 1.2 1.0  PROT 6.1* 5.4* 5.4*  ALBUMIN 3.3* 3.1* 2.7*     Antibiotics: Anti-infectives (From admission, onward)    Start     Dose/Rate Route Frequency Ordered Stop   01/25/23 1400  cefTRIAXone (ROCEPHIN) 1 g in sodium chloride 0.9 %  100 mL IVPB        1 g 200 mL/hr over 30 Minutes Intravenous Every 24 hours 01/24/23 1819 01/29/23 1359   01/25/23 1400  azithromycin (ZITHROMAX) 500 mg in sodium chloride 0.9 % 250 mL IVPB        500 mg 250 mL/hr over 60 Minutes Intravenous Every 24 hours 01/24/23 1819 01/29/23 1359   01/24/23 2200  acyclovir (ZOVIRAX) 200 MG capsule 200 mg        200 mg Oral 2 times daily 01/24/23 1509     01/24/23 1400  cefTRIAXone (ROCEPHIN) 1 g in sodium chloride 0.9 % 100 mL IVPB        1 g 200 mL/hr over 30 Minutes Intravenous  Once 01/24/23 1350 01/24/23 1451   01/24/23 1400  azithromycin (ZITHROMAX) 500 mg in sodium chloride 0.9 % 250 mL IVPB        500 mg 250 mL/hr over 60 Minutes Intravenous  Once 01/24/23 1350 01/24/23 1648        DVT prophylaxis: Warfarin  Code Status: Full code  Family Communication: No family at bedside   CONSULTS    Subjective   Patient seen, denies palpitations or shortness of breath.   Objective    Physical Examination:   General: Appears in no acute distress Cardiovascular: S1-S2, irregular Respiratory: Scattered wheezing at lung bases Abdomen: Soft, nontender, no organomegaly Extremities: No edema in the lower extremities Neurologic: Alert, oriented x 3 ,intact insight and judgment, no focal deficit noted   Status is: Inpatient:             Meredeth Ide   [facility] If 7PM-7AM, please contact night-coverage at [email or web address], Office  [phone]   01/25/2023, 7:20 AM  LOS: 1 day

## 2023-01-25 NOTE — Progress Notes (Addendum)
PT continues to be off cardizem drip-provider informed. Blood pressures still soft but improved. HR has been consistently between 70-90's before and after drip stopped. PT has chronic A.fib and is not expected to convert to sinus rhythm.

## 2023-01-25 NOTE — Progress Notes (Signed)
ANTICOAGULATION CONSULT NOTE - Initial Consult  Pharmacy Consult for warfarin Indication: atrial fibrillation  No Known Allergies  Patient Measurements: Height: 6' (182.9 cm) Weight: 83.8 kg (184 lb 11.9 oz) IBW/kg (Calculated) : 77.6  Vital Signs: Temp: 97.9 F (36.6 C) (06/19 0755) Temp Source: Oral (06/19 0755) BP: 115/57 (06/19 0800) Pulse Rate: 89 (06/19 0852)  Labs: Recent Labs    01/24/23 0938 01/24/23 1208 01/25/23 0325  HGB 14.2  --  12.6*  HCT 42.1  --  38.3*  PLT 168  --  169  LABPROT  --  28.1* 30.8*  INR  --  2.6* 2.9*  CREATININE 0.77  --  0.76     Estimated Creatinine Clearance: 82.2 mL/min (by C-G formula based on SCr of 0.76 mg/dL).   Medical History: Past Medical History:  Diagnosis Date   Atrial fibrillation (HCC)    CAD (coronary artery disease)    a. s/p MI with LAD stent placement December, 1999 -last stress test November 2008 with no EKG evidence of ischemia  b. cath 07/23/2015 after abnormal ETT and nuc, occluded mid LAD stent with good distal collateral, medical therapy    Cardiomyopathy (HCC)    Ischemic cardiomyopathy with EF 40% by echo 2005   COVID 2021   mild case   Dyslipidemia    Erectile dysfunction    GERD (gastroesophageal reflux disease)    Hiatal hernia    with esophageal strictures and dilatations in the past - Dr. [name]   History of nuclear stress test    Myoview 12/16: EF 47%, anterior, anteroseptal and inferoseptal defect suggesting infarct with peri-infarct ischemia, intermediate risk   HTN (hypertension)    Hx of seasonal allergies    Hyperlipidemia    Myocardial infarction (HCC) 08/01/1998   PAD (peripheral artery disease) (HCC)    Pre-diabetes    Prostate cancer (HCC)    with seed implant therapy following Lupron therapy in 2008 and 2009 - followed by Dr. Isabel Caprice   Ringworm    Stroke (HCC) 04/20/2017   seen on a MRI   Vitamin D deficiency     Medications:  Prior to admission medications include  warfarin Last dose: 6/17 Per Coumadin Clinic: INR Goal: 2-3 Warfarin regimen: 2.5 mg every Mon, Wed, Fri; 5 mg all other days Pt has also been started on ceftriaxone and azithromycin, which potentially increase warfarin effects   Assessment: Pharmacy consulted to dose warfarin for this 82 yo male admitted with afib RVR and CAP sent from cancer center where he is being treated for multiple myeloma.  History includes embolic stroke and afib on long term use of warfarin.    Today, 01/25/23 INR therapeutic at 2.9. Hgb 12.6, plt 169  No meal intake charted   Goal of Therapy:  INR 2-3 Monitor platelets by anticoagulation protocol: Yes   Plan:  Warfarin 2.5 mg po today as per patient's home regimen Daily INR for warfarin dose Monitor CBC, signs/symptoms of bleeding     Adalberto Cole, PharmD, BCPS 01/25/2023 9:24 AM

## 2023-01-26 ENCOUNTER — Other Ambulatory Visit: Payer: Self-pay

## 2023-01-26 ENCOUNTER — Ambulatory Visit: Payer: Medicare Other

## 2023-01-26 DIAGNOSIS — I5022 Chronic systolic (congestive) heart failure: Secondary | ICD-10-CM | POA: Diagnosis not present

## 2023-01-26 DIAGNOSIS — I4891 Unspecified atrial fibrillation: Secondary | ICD-10-CM | POA: Diagnosis not present

## 2023-01-26 DIAGNOSIS — I482 Chronic atrial fibrillation, unspecified: Secondary | ICD-10-CM | POA: Diagnosis not present

## 2023-01-26 DIAGNOSIS — J189 Pneumonia, unspecified organism: Secondary | ICD-10-CM | POA: Diagnosis not present

## 2023-01-26 LAB — CULTURE, BLOOD (ROUTINE X 2): Culture: NO GROWTH

## 2023-01-26 LAB — CBC
HCT: 42.8 % (ref 39.0–52.0)
Hemoglobin: 14.2 g/dL (ref 13.0–17.0)
MCH: 33.6 pg (ref 26.0–34.0)
MCHC: 33.2 g/dL (ref 30.0–36.0)
MCV: 101.2 fL — ABNORMAL HIGH (ref 80.0–100.0)
Platelets: 194 10*3/uL (ref 150–400)
RBC: 4.23 MIL/uL (ref 4.22–5.81)
RDW: 14.8 % (ref 11.5–15.5)
WBC: 19 10*3/uL — ABNORMAL HIGH (ref 4.0–10.5)
nRBC: 0 % (ref 0.0–0.2)

## 2023-01-26 LAB — PROTIME-INR
INR: 3.3 — ABNORMAL HIGH (ref 0.8–1.2)
Prothrombin Time: 33.6 seconds — ABNORMAL HIGH (ref 11.4–15.2)

## 2023-01-26 MED ORDER — METOPROLOL TARTRATE 25 MG PO TABS
25.0000 mg | ORAL_TABLET | Freq: Once | ORAL | Status: AC
  Administered 2023-01-26: 25 mg via ORAL
  Filled 2023-01-26: qty 1

## 2023-01-26 MED ORDER — WARFARIN SODIUM 1 MG PO TABS
1.5000 mg | ORAL_TABLET | Freq: Once | ORAL | Status: AC
  Administered 2023-01-26: 1.5 mg via ORAL
  Filled 2023-01-26: qty 1

## 2023-01-26 MED ORDER — METOPROLOL TARTRATE 25 MG PO TABS
50.0000 mg | ORAL_TABLET | Freq: Two times a day (BID) | ORAL | Status: DC
  Administered 2023-01-26 – 2023-01-27 (×2): 50 mg via ORAL
  Filled 2023-01-26 (×2): qty 2

## 2023-01-26 NOTE — Progress Notes (Signed)
Patients wife stopped by clinic today and wanted to know from the provider if patient should take his Decadron as scheduled since he is supposed to have treatment on Monday 6/24.  Provider advised if patient is discharged to take the Decadron as ordered and then he will see the patient as scheduled on Monday 6/24 to decide if he gets treatment.   If patient remains inpatient to hold the dose of decadron.  Wife acknowledged understanding and has no further questions at this time.

## 2023-01-26 NOTE — Progress Notes (Signed)
25 mg Toprol given at 0930... VO given by Dr. Eden Emms to: Give him 25 mg of short acting lopressor now and then can give 50 mg for PM dose.

## 2023-01-26 NOTE — Progress Notes (Signed)
ANTICOAGULATION CONSULT NOTE - Initial Consult  Pharmacy Consult for warfarin Indication: atrial fibrillation  No Known Allergies  Patient Measurements: Height: 6' (182.9 cm) Weight: 83.8 kg (184 lb 11.9 oz) IBW/kg (Calculated) : 77.6  Vital Signs: Temp: 97.3 F (36.3 C) (06/20 0801) Temp Source: Oral (06/20 0801) BP: 119/74 (06/20 0700) Pulse Rate: 61 (06/20 0801)  Labs: Recent Labs    01/24/23 0938 01/24/23 1208 01/25/23 0325 01/26/23 0324 01/26/23 0325  HGB 14.2  --  12.6* 14.2  --   HCT 42.1  --  38.3* 42.8  --   PLT 168  --  169 194  --   LABPROT  --  28.1* 30.8*  --  33.6*  INR  --  2.6* 2.9*  --  3.3*  CREATININE 0.77  --  0.76  --   --      Estimated Creatinine Clearance: 82.2 mL/min (by C-G formula based on SCr of 0.76 mg/dL).   Medical History: Past Medical History:  Diagnosis Date   Atrial fibrillation (HCC)    CAD (coronary artery disease)    a. s/p MI with LAD stent placement December, 1999 -last stress test November 2008 with no EKG evidence of ischemia  b. cath 07/23/2015 after abnormal ETT and nuc, occluded mid LAD stent with good distal collateral, medical therapy    Cardiomyopathy (HCC)    Ischemic cardiomyopathy with EF 40% by echo 2005   COVID 2021   mild case   Dyslipidemia    Erectile dysfunction    GERD (gastroesophageal reflux disease)    Hiatal hernia    with esophageal strictures and dilatations in the past - Dr. [name]   History of nuclear stress test    Myoview 12/16: EF 47%, anterior, anteroseptal and inferoseptal defect suggesting infarct with peri-infarct ischemia, intermediate risk   HTN (hypertension)    Hx of seasonal allergies    Hyperlipidemia    Myocardial infarction (HCC) 08/01/1998   PAD (peripheral artery disease) (HCC)    Pre-diabetes    Prostate cancer (HCC)    with seed implant therapy following Lupron therapy in 2008 and 2009 - followed by Dr. Isabel Caprice   Ringworm    Stroke (HCC) 04/20/2017   seen on a MRI    Vitamin D deficiency     Medications:  Prior to admission medications include warfarin Last dose: 6/17 Per [facility]: INR Goal: 2-3 Warfarin regimen: 2.5 mg every Mon, Wed, Fri; 5 mg all other days Pt has also been started on ceftriaxone and azithromycin, which potentially increase warfarin effects   Assessment: Pharmacy consulted to dose warfarin for this 82 yo male admitted with afib RVR and CAP sent from cancer center where he is being treated for multiple myeloma.  History includes embolic stroke and afib on long term use of warfarin.    Today, 01/26/23 INR supra-therapeutic at 3.3 (yesterday's warfarin dose=2.5mg , INR up 0.4 from yesterday, on IV antibiotics) Hgb WNL, plt WNL  Meal intake on 6/19 = 100%   Goal of Therapy:  INR 2-3 Monitor platelets by anticoagulation protocol: Yes   Plan:  Warfarin 1.5 mg x 1 today Daily INR for warfarin dose Monitor CBC, signs/symptoms of bleeding     Thank you for allowing pharmacy to be a part of this patient's care.  Selinda Eon, PharmD, BCPS Clinical Pharmacist [name] 01/26/2023 9:03 AM

## 2023-01-26 NOTE — Consult Note (Signed)
Cardiology Consultation   Patient ID: LODEN ALBER MRN: [id number]; DOB: [birth year]  Admit date: 01/24/2023 Date of Consult: 01/26/2023  PCP:  [name], MD   Ronald Duran Cardiologist:  [name], MD        Patient Profile:   Ronald Duran is a 82 y.o. male with a hx of CAD, HLD, NSCLC s/p radiation therapy, chronic systolic heart failure with hypotension, PAF on OAC with coumadin who is being seen 01/26/2023 for the evaluation of mural thrombus and possible coumadin failure at the request of Dr. Sharl Duran.  History of Present Illness:   [name] has a history of MI with stent placement in the LAD 07/1998 with ICM and EF 40% on echo 02/14/04. Heart cath 07/2015 after abnormal nuclear stress test showed total occlusion in the LAD at the site of the previously placed stent with well-developed collaterals form dominant right. EF 45-50% with apical akinesis. Follow up studies with LVEF 45%, last echo 13-Feb-2022 with LVEF 45-50%.    Afib incidentally found in Sept 2023 by PCP. DCCV on 06/03/22 with early return to Afib. Given history of cardiomyopathy, he was referred to EP for consideration of ablation. CT chest detected multiple pulmonary nodules suspicious for possible malignancy and ablation was canceled. Biopsy of lytic bone lesion revealed NSCLC. He has received palliative radiotherapy. Chemotherapy stopped due to thrombocytopenia. 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 Heme/onc felt it was safe to continue OAC. He was on coumadin.   He was last seen by Dr. Izora Duran 12/2022 and had no cardiac complaints. INR has fluctuated to 1.6 on 01/04/23. Afib rate controlled with toprol 25 mg.   He presented to [facility] 01/24/23 with RVR noted at the cancer center. HR in the 140s. He was placed on cardizem gtt for rate control. Imaging suspicious for pneumonia.   He was started on ABX for CAP. 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 Normal RV function, and no significant valvular disease.   Cardiology was consulted for [facility] recommendations given possible warfarin failure. Upon further discussion, patient was bridged with lovenox for procedures (hip stabilization and bronch) and INR fluctuated after that. He denies s/s of acute CHF. He reports productive cough consistent with likely PNA.    Past Medical History:  Diagnosis Date   Atrial fibrillation (HCC)    CAD (coronary artery disease)    a. s/p MI with LAD stent placement December, 1999 -last stress test November 2008 with no EKG evidence of ischemia  b. cath 07/23/2015 after abnormal ETT and nuc, occluded mid LAD stent with good distal collateral, medical therapy    Cardiomyopathy (HCC)    Ischemic cardiomyopathy with EF 40% by echo 02/14/04   COVID 02-14-2020   mild case   Dyslipidemia    Erectile dysfunction    GERD (gastroesophageal reflux disease)    Hiatal hernia    with esophageal strictures and dilatations in the past - Dr. Lina Duran   History of nuclear stress test    Myoview 12/16: EF 47%, anterior, anteroseptal and inferoseptal defect suggesting infarct with peri-infarct ischemia, intermediate risk   HTN (hypertension)    Hx of seasonal allergies    Hyperlipidemia    Myocardial infarction (HCC) 08/01/1998   PAD (peripheral artery disease) (HCC)    Pre-diabetes    Prostate cancer (HCC)  with seed implant therapy following Lupron therapy in 2008 and 2009 - followed by Dr. Pollyann Duran    Stroke [facility]) 04/20/2017   seen on a MRI   Vitamin D deficiency     Past Surgical History:  Procedure Laterality Date   BRONCHIAL BIOPSY  11/15/2022   Procedure: BRONCHIAL BIOPSIES;  Surgeon: Ronald Igo, DO;  Location: [facility] ENDOSCOPY;  Service: Pulmonary;;   BRONCHIAL BRUSHINGS  11/15/2022   Procedure: BRONCHIAL BRUSHINGS;  Surgeon: Ronald Igo, DO;  Location: [facility] ENDOSCOPY;  Service: Pulmonary;;   BRONCHIAL NEEDLE ASPIRATION BIOPSY  11/15/2022   Procedure: BRONCHIAL NEEDLE ASPIRATION BIOPSIES;  Surgeon: Ronald Igo, DO;  Location: [facility] ENDOSCOPY;  Service: Pulmonary;;   CARDIAC CATHETERIZATION N/A 07/23/2015   Procedure: Left Heart Cath and Coronary Angiography;  Surgeon: [name], MD;  Location[facility] INVASIVE CV LAB;  Service: Cardiovascular;  Laterality: N/A;   CARDIOVERSION N/A 06/03/2022   Procedure: CARDIOVERSION;  Surgeon: [name], MD;  Location: [facility] ENDOSCOPY;  Service: Cardiovascular;  Laterality: N/A;   COLONOSCOPY WITH PROPOFOL N/A 11/24/2015   Procedure: COLONOSCOPY WITH PROPOFOL;  Surgeon: [name], MD;  Location: [facility] ENDOSCOPY;  Service: Endoscopy;  Laterality: N/A;   esophageal strictures with dilatations in the past per Dr. Lina Duran     last '98   FIDUCIAL MARKER PLACEMENT  11/15/2022   Procedure: FIDUCIAL MARKER PLACEMENT;  Surgeon: Ronald Igo, DO;  Location: [facility] ENDOSCOPY;  Service: Pulmonary;;   HERNIA REPAIR Left    INTRAMEDULLARY (IM) NAIL INTERTROCHANTERIC Left 10/25/2022   Procedure: LEFT INTRAMEDULLARY (IM) NAIL INTERTROCHANTERIC;  Surgeon: [name], MD;  Location: [facility] OR;  Service: Orthopedics;  Laterality: Left;   IR BONE MARROW BIOPSY & ASPIRATION  11/17/2022   IR BONE TUMOR(S)RF ABLATION  10/11/2022   IR KYPHO THORACIC WITH BONE BIOPSY  10/11/2022   LAD stent placement - Dr. [name]     left inguinal hernia repair - Dr. Jerelene Duran     prostatic radioactive seed implantation - Dr. [name]     '09     Home Medications:  Prior to Admission medications   Medication Sig Start Date End Date Taking? Authorizing Provider  acyclovir (ZOVIRAX) 200 MG capsule Take 1 capsule (200 mg total) by mouth 2 (two) times daily. 11/23/22  Yes Si Gaul, MD  atorvastatin (LIPITOR) 40 MG tablet Take 40 mg by mouth at bedtime. 04/22/17  Yes [provider]  B Complex Vitamins (B  COMPLEX 100) tablet Take 1 tablet by mouth daily. Balance   Yes [provider]  cetirizine (ZYRTEC ALLERGY) 10 MG tablet Take 1 tablet (10 mg total) by mouth daily. 01/14/23  Yes Wallis Bamberg, PA-C  Cholecalciferol (VITAMIN D3) 2000 UNITS TABS Take 2,000 Units by mouth daily.   Yes [provider]  Coenzyme Q10 (CO Q-10) 100 MG CAPS Take 100 mg by mouth daily with supper.   Yes [provider]  dexamethasone (DECADRON) 4 MG tablet 5 tab weekly start with the first dose of chemotherapy. Patient taking differently: Take 20 mg by mouth See admin instructions. Take 5 tablets by mouth weekly start with the first dose of chemotherapy. Patient states he did take today because he thought maybe he would have Chemo today per patient 11/23/22  Yes Si Gaul, MD  lenalidomide (REVLIMID) 15 MG capsule Take 1 capsule (15 mg total) by mouth daily. Take for 14 days, then none for 7 days. Repeat every 21 days.  Start Revlimid on day 1 of chemotherapy Auth #=[id number] date obtained 12/19/2022 Adult Male. 12/28/22  Yes Si Gaul, MD  metoprolol succinate (TOPROL XL) 25 MG 24 hr tablet Take 1 tablet (25 mg total) by mouth daily. 09/20/22  Yes Swinyer, Zachary George, NP  Multiple Vitamins-Minerals (CENTRUM) tablet Take 1 tablet by mouth daily.   Yes [provider]  nitroGLYCERIN (NITROSTAT) 0.4 MG SL tablet PLACE 1 TABLET UNDER THE TONGUE IF NEEDED EVERY 5 MINUTES FOR CHEST PAIN FOR 3 DOSES IF NO RELIEF AFTER FIRST DOSE CALL 911. Patient taking differently: Place 0.4 mg under the tongue every 5 (five) minutes as needed for chest pain. 01/26/22  Yes [name], MD  omeprazole (PRILOSEC) 20 MG capsule Take 20 mg by mouth daily before breakfast. 06/28/22  Yes [provider]  prochlorperazine (COMPAZINE) 10 MG tablet Take 1 tablet (10 mg total) by mouth every 6 (six) hours as needed for nausea or vomiting. 11/24/22  Yes Si Gaul, MD  promethazine-dextromethorphan  (PROMETHAZINE-DM) 6.25-15 MG/5ML syrup Take 2.5 mLs by mouth 3 (three) times daily as needed for cough. 01/14/23  Yes Wallis Bamberg, PA-C  warfarin (COUMADIN) 5 MG tablet TAKE 1/2 TO 1 TABLET BY MOUTH DAILY AS DIRECTED BY COUMADIN CLINIC Patient taking differently: Take 2.5-5 mg by mouth See admin instructions. Take 2.5 mg by mouth on Monday, Wednesday and Friday and take 5 mg on Tuesday, Thursday, Saturday and Sunday 09/21/22  Yes Skains, Veverly Fells, MD  nystatin (MYCOSTATIN/NYSTOP) powder Apply 1 Application topically 3 (three) times daily. Patient not taking: Reported on 01/24/2023 01/10/23   Heilingoetter, Cassandra L, PA-C  triamcinolone ointment (KENALOG) 0.5 % Apply 1 Application topically 2 (two) times daily. Patient taking differently: Apply 1 Application topically daily as needed (For left foot rash). 12/19/22   Shanon Ace, PA-C    Inpatient Medications: Scheduled Meds:  acyclovir  200 mg Oral BID   atorvastatin  40 mg Oral QHS   Chlorhexidine Gluconate Cloth  6 each Topical Daily   loratadine  10 mg Oral Daily   metoprolol succinate  25 mg Oral Daily   pantoprazole  40 mg Oral Daily   Warfarin - Pharmacist Dosing Inpatient   Does not apply q1600   Continuous Infusions:  azithromycin Stopped (01/25/23 1606)   cefTRIAXone (ROCEPHIN)  IV Stopped (01/25/23 1645)   diltiazem (CARDIZEM) infusion Stopped (01/25/23 1332)   PRN Meds: acetaminophen **OR** acetaminophen, guaiFENesin-dextromethorphan, levalbuterol, nitroGLYCERIN, ondansetron **OR** ondansetron (ZOFRAN) IV, mouth rinse  Allergies:   No Known Allergies  Social History:   Social History   Socioeconomic History   Marital status: Married    Spouse name: ,Talbert Forest   Number of children: 4   Years of education: Not on file   Highest education level: Not on file  Occupational History   Not on file  Tobacco Use   Smoking status: Never    Passive exposure: Past   Smokeless tobacco: Never  Vaping Use   Vaping Use: Never  used  Substance and Sexual Activity   Alcohol use: Not Currently   Drug use: No   Sexual activity: Not Currently  Other Topics Concern   Not on file  Social History Narrative   Not on file   Social Determinants of Health   Financial Resource Strain: Not on file  Food Insecurity: No Food Insecurity (01/24/2023)   Hunger Vital Sign    Worried About Running Out of Food in the Last Year: Never true    Ran Out  of Food in the Last Year: Never true  Transportation Needs: Unmet Transportation Needs (01/24/2023)   PRAPARE - Administrator, Civil Service (Medical): Yes    Lack of Transportation (Non-Medical): No  Physical Activity: Not on file  Stress: Not on file  Social Connections: Not on file  Intimate Partner Violence: Not At Risk (01/24/2023)   Humiliation, Afraid, Rape, and Kick questionnaire    Fear of Current or Ex-Partner: No    Emotionally Abused: No    Physically Abused: No    Sexually Abused: No    Family History:    Family History  Problem Relation Age of Onset   CVA Mother    CVA Father 48   CAD Sister      ROS:  Please see the history of present illness.   All other ROS reviewed and negative.     Physical Exam/Data:   Vitals:   01/26/23 0500 01/26/23 0700 01/26/23 0800 01/26/23 0801  BP: 136/81 119/74    Pulse: 81 75 (!) 51 61  Resp: 20 13 (!) 23 (!) 21  Temp:    (!) 97.3 F (36.3 C)  TempSrc:    Oral  SpO2: 93% 90% (!) 87% 91%  Weight:      Height:        Intake/Output Summary (Last 24 hours) at 01/26/2023 0851 Last data filed at 01/26/2023 0800 Gross per 24 hour  Intake 519.76 ml  Output --  Net 519.76 ml      01/24/2023    4:00 PM 01/24/2023   11:33 AM 01/24/2023   10:00 AM  Last 3 Weights  Weight (lbs) 184 lb 11.9 oz 187 lb 187 lb 6.4 oz  Weight (kg) 83.8 kg 84.823 kg 85.004 kg     Body mass index is 25.06 kg/m.  General:  Well nourished, well developed, in no acute distress HEENT: normal Neck: no JVD Vascular: No carotid  bruits; Distal pulses 2+ bilaterally Cardiac:  normal S1, S2; RRR; no murmur  Lungs:  clear to auscultation bilaterally, no wheezing, rhonchi or rales  Abd: soft, nontender, no hepatomegaly  Ext: no edema Musculoskeletal:  No deformities, BUE and BLE strength normal and equal Skin: warm and dry  Neuro:  CNs 2-12 intact, no focal abnormalities noted Psych:  Normal affect   EKG:  The EKG was personally reviewed and demonstrates:  atrial fibrillation with VR 147 Telemetry:  Telemetry was personally reviewed and demonstrates:  afib in the 130s  Relevant CV Studies:  Echo 01/25/23:  1. Left ventricular ejection fraction, by estimation, is 35-40%. The left  ventricle has normal function. The left ventricle has no regional wall  motion abnormalities. The left ventricular internal cavity size was  moderately dilated. Left ventricular  diastolic function could not be evaluated. There is akinesis of the left  ventricular, inferior wall, lateral wall and anterior wall. There is  akinesis of the left ventricular, apical segment. 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   2. Right ventricular systolic function is normal. The right ventricular  size is normal. Tricuspid regurgitation signal is inadequate for assessing  PA pressure.   3. The mitral valve is normal in structure. Trivial mitral valve  regurgitation. No evidence of mitral stenosis.   4. The aortic valve was not well visualized. Aortic valve regurgitation  is not visualized. Aortic valve sclerosis/calcification is present,  without any evidence of aortic stenosis.   5. The inferior  vena cava is dilated in size with >50% respiratory  variability, suggesting right atrial pressure of 8 mmHg.   Laboratory Data:  High Sensitivity Troponin:  No results for input(s): "TROPONINIHS" in the last 720 hours.   Chemistry Recent Labs  Lab 01/24/23 0938 01/24/23 1552 01/25/23 0325   NA 137  --  134*  K 4.0  --  4.4  CL 105  --  103  CO2 26  --  22  GLUCOSE 123*  --  151*  BUN 16  --  22  CREATININE 0.77  --  0.76  CALCIUM 9.0  --  8.7*  MG  --  2.3  --   GFRNONAA >60  --  >60  ANIONGAP 6  --  9    Recent Labs  Lab 01/24/23 0938 01/25/23 0325  PROT 5.4* 5.4*  ALBUMIN 3.1* 2.7*  AST 24 28  ALT 39 47*  ALKPHOS 68 62  BILITOT 1.2 1.0   Lipids No results for input(s): "CHOL", "TRIG", "HDL", "LABVLDL", "LDLCALC", "CHOLHDL" in the last 168 hours.  Hematology Recent Labs  Lab 01/24/23 0938 01/25/23 0325 01/26/23 0324  WBC 12.9* 15.1* 19.0*  RBC 4.28 3.79* 4.23  HGB 14.2 12.6* 14.2  HCT 42.1 38.3* 42.8  MCV 98.4 101.1* 101.2*  MCH 33.2 33.2 33.6  MCHC 33.7 32.9 33.2  RDW 14.6 14.6 14.8  PLT 168 169 194   Thyroid  Recent Labs  Lab 01/24/23 1848  TSH 0.186*    BNPNo results for input(s): "BNP", "PROBNP" in the last 168 hours.  DDimer No results for input(s): "DDIMER" in the last 168 hours.   Radiology/Studies:  ECHOCARDIOGRAM COMPLETE  Result Date: 01/25/2023    ECHOCARDIOGRAM REPORT   Patient Name:   Ronald Duran Date of Exam: 01/25/2023 Medical Rec #:  [id number]        Height:       72.0 in Accession #:    [id number]       Weight:       184.7 lb Date of Birth:  [birth year]       BSA:          2.060 m Patient Age:    81 years         BP:           94/68 mmHg Patient Gender: M                HR:           92 bpm. Exam Location:  Inpatient Procedure: 2D Echo, Cardiac Doppler, Color Doppler and Intracardiac            Opacification Agent Indications:     I48.91* Unspeicified atrial fibrillation  History:         Patient has prior history of Echocardiogram examinations, most                  recent 10/21/2021. CHF and Cardiomyopathy, CAD and Previous                  Myocardial Infarction, Abnormal ECG, Stroke, Arrythmias:Atrial                  Fibrillation and Bradycardia; Risk Factors:Hypertension and                  Dyslipidemia. Metastatic  cancer.  Sonographer:     Sheralyn Boatman RDCS Referring Phys:  [id number] DAVID Kelby Fam ORTIZ Diagnosing Phys: Armanda Magic MD  Sonographer Comments:  Suboptimal parasternal window. IMPRESSIONS  1. Left ventricular ejection fraction, by estimation, is 35-40%. The left ventricle has normal function. The left ventricle has no regional wall motion abnormalities. The left ventricular internal cavity size was moderately dilated. Left ventricular diastolic function could not be evaluated. There is akinesis of the left ventricular, inferior wall, lateral wall and anterior wall. There is akinesis of the left ventricular, apical segment. 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  2. Right ventricular systolic function is normal. The right ventricular size is normal. Tricuspid regurgitation signal is inadequate for assessing PA pressure.  3. The mitral valve is normal in structure. Trivial mitral valve regurgitation. No evidence of mitral stenosis.  4. The aortic valve was not well visualized. Aortic valve regurgitation is not visualized. Aortic valve sclerosis/calcification is present, without any evidence of aortic stenosis.  5. The inferior vena cava is dilated in size with >50% respiratory variability, suggesting right atrial pressure of 8 mmHg. FINDINGS  Left Ventricle: Left ventricular ejection fraction, by estimation, is 35 to 40%. The left ventricle has moderately decreased function. The left ventricle demonstrates regional wall motion abnormalities. Definity contrast agent was given IV to delineate the left ventricular endocardial borders. The left ventricular internal cavity size was moderately dilated. There is no left ventricular hypertrophy. Left ventricular diastolic function could not be evaluated due to atrial fibrillation. Left ventricular diastolic function could not be evaluated. Right Ventricle: The right ventricular size is normal. No increase in right  ventricular wall thickness. Right ventricular systolic function is normal. Tricuspid regurgitation signal is inadequate for assessing PA pressure. Left Atrium: Left atrial size was normal in size. Right Atrium: Right atrial size was normal in size. Pericardium: There is no evidence of pericardial effusion. Mitral Valve: The mitral valve is normal in structure. Trivial mitral valve regurgitation. No evidence of mitral valve stenosis. Tricuspid Valve: The tricuspid valve is normal in structure. Tricuspid valve regurgitation is not demonstrated. No evidence of tricuspid stenosis. Aortic Valve: The aortic valve was not well visualized. Aortic valve regurgitation is not visualized. Aortic valve sclerosis/calcification is present, without any evidence of aortic stenosis. Pulmonic Valve: The pulmonic valve was normal in structure. Pulmonic valve regurgitation is not visualized. No evidence of pulmonic stenosis. Aorta: The aortic root is normal in size and structure. Venous: The inferior vena cava is dilated in size with greater than 50% respiratory variability, suggesting right atrial pressure of 8 mmHg. IAS/Shunts: No atrial level shunt detected by color flow Doppler.  LEFT VENTRICLE PLAX 2D LVIDd:         6.00 cm LVIDs:         5.20 cm LV PW:         0.90 cm LV IVS:        0.90 cm LVOT diam:     2.10 cm LV SV:         62 LV SV Index:   30 LVOT Area:     3.46 cm  LV Volumes (MOD) LV vol d, MOD A2C: 146.0 ml LV vol d, MOD A4C: 114.5 ml LV vol s, MOD A2C: 100.0 ml LV vol s, MOD A4C: 99.6 ml LV SV MOD A2C:     46.0 ml LV SV MOD A4C:     114.5 ml LV SV MOD BP:      32.0 ml RIGHT VENTRICLE            IVC RV S prime:     6.74  cm/s  IVC diam: 2.60 cm TAPSE (M-mode): 0.9 cm LEFT ATRIUM             Index        RIGHT ATRIUM           Index LA diam:        4.70 cm 2.28 cm/m   RA Area:     14.50 cm LA Vol (A2C):   53.0 ml 25.73 ml/m  RA Volume:   31.40 ml  15.24 ml/m LA Vol (A4C):   32.4 ml 15.73 ml/m LA Biplane Vol: 41.1 ml  19.95 ml/m  AORTIC VALVE LVOT Vmax:   105.00 cm/s LVOT Vmean:  73.500 cm/s LVOT VTI:    0.180 m  AORTA Ao Root diam: 3.10 cm Ao Asc diam:  3.40 cm MITRAL VALVE MV Area (PHT): 4.39 cm     SHUNTS MV Decel Time: 173 msec     Systemic VTI:  0.18 m MV E velocity: 108.00 cm/s  Systemic Diam: 2.10 cm Armanda Magic MD Electronically signed by Armanda Magic MD Signature Date/Time: 01/25/2023/10:04:18 AM    Final (Updated)    DG Chest Portable 1 View  Result Date: 01/24/2023 CLINICAL DATA:  cough EXAM: PORTABLE CHEST - 1 VIEW COMPARISON:  01/14/2023 FINDINGS: Patchy interstitial and airspace opacities in both lung bases, new since previous. Blunting of the lateral costophrenic angles. Heart size upper limits normal. Aortic Atherosclerosis (ICD10-170.0). Visualized bones unremarkable. IMPRESSION: Patchy bibasilar infiltrates and small effusions. Electronically Signed   By: Corlis Leak M.D.   On: 01/24/2023 12:56     Assessment and Plan:   Echo with suspicion for apical aneurysm, layered mural thrombus  - noted in the apex with definity - echo reviewed with attending, do not feel this is a thrombus - pt had a TIA in 2018 - will continue coumadin for now - do not consider this a coumadin failure   Persistent atrial fibrillation - initially rate controlled with 5 mg/hr cardizem gtt  - cardizem stopped for ICM and EF 35-40% - increase BB to lopressor 50 mg BID - suspect his rate will improve as his PNA clears   Subtherapeutic INR - INR 1.6 in May - no signs of stroke - currently on coumadin with INR 3.3   Chronic systolic heart failure Felt ischemic cardiomyopathy given heart disease - echo yesterday with LVEF reduced from 45% to 35-40% - GDMT: toprol - is not volume up on exam - PRN lasix if needed   NSCLC - lytic bone lesions - receiving palliative radiation   Multiple myeloma - receiving daratumumab, velcade, revlimid, and dexamethasone    CAP ABX per primary    Risk  Assessment/Risk Scores:     CHA2DS2-VASc Score = 7   This indicates a 11.2% annual risk of stroke. The patient's score is based upon: CHF History: 1 HTN History: 1 Diabetes History: 0 Stroke History: 2 Vascular Disease History: 1 Age Score: 2 Gender Score: 0        For questions or updates, please contact [facility] Please consult [email or web address] for contact info under    Signed, Marcelino Duster, PA  01/26/2023 8:51 AM

## 2023-01-26 NOTE — Progress Notes (Addendum)
[facility]  PROGRESS NOTE  Ronald Duran ZOX:[id number] DOB: 1941/04/04 DOA: 01/24/2023 PCP: [name], MD   Brief HPI:    82 y.o. 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  Portable 1 view chest radiographs show patchy bibasilar infiltrates and small effusions.    Assessment/Plan:   Community-acquired pneumonia -Started on ceftriaxone and Zithromax -Strep pneumo urinary antigen is positive -Sputum culture growing gram-positive cocci and gram-negative rods -Blood cultures x 2 is negative to date  Hypertension -Blood pressure is stable -Started on metoprolol 25 mg p.o. daily. -Dose of metoprolol changed to 50 mg p.o. twice daily as per cardiology   Hyperlipidemia -Continue atorvastatin  Paroxysmal atrial fibrillation with RVR -Started on Cardizem gtt. 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 -Thrombus appears to be chronic, first was diagnosed in September 2018, reviewed echo from [facility]. -Cardiology has seen and does not feel that there is a failure  of warfarin treatment.  Recommend to continue with warfarin.  Hiatal hernia/esophageal stricture -Continue Protonix 40 mg daily  CAD  -Continue atorvastatin, metoprolol  Chronic systolic heart failure -No signs symptoms of decompensation at this time -Continue metoprolol, will likely benefit from ACE inhibitor/ARB  Multiple myeloma -Patient followed by Dr. Arbutus Ped -Currently on chemotherapy with daratumumab, Velcade, Revlimid and dexamethasone started on 12/05/2022 status post 2 cycle   Non-small cell lung cancer -CT chest showed right lower lobe lung nodule suspicious for non-small cell lung cancer -S/p radiation 2 weeks ago   Medications     acyclovir  200 mg Oral BID   atorvastatin  40 mg Oral QHS   Chlorhexidine Gluconate Cloth  6 each Topical Daily   loratadine  10 mg Oral Daily   metoprolol tartrate  50 mg Oral BID   pantoprazole  40 mg Oral Daily   warfarin  1.5 mg Oral ONCE-1600   Warfarin - Pharmacist Dosing Inpatient   Does not apply q1600     Data Reviewed:   CBG:  No results for input(s): "GLUCAP" in the last 168 hours.  SpO2: 91 % O2 Flow Rate (L/min): 1 L/min    Vitals:   01/26/23 0500 01/26/23 0700 01/26/23 0800 01/26/23 0801  BP: 136/81 119/74    Pulse: 81 75 (!) 51 61  Resp: 20 13 (!) 23 (!) 21  Temp:    (!) 97.3 F (36.3 C)  TempSrc:    Oral  SpO2: 93% 90% (!) 87% 91%  Weight:      Height:          Data Reviewed:  Basic Metabolic Panel: Recent Labs  Lab 01/24/23 0938 01/24/23 1552 01/25/23 0325  NA 137  --  134*  K 4.0  --  4.4  CL  105  --  103  CO2 26  --  22  GLUCOSE 123*  --  151*  BUN 16  --  22  CREATININE 0.77  --  0.76  CALCIUM 9.0  --  8.7*  MG  --  2.3  --   PHOS  --  2.8  --     CBC: Recent Labs  Lab 01/24/23 0938 01/25/23 0325 01/26/23 0324  WBC 12.9* 15.1* 19.0*  NEUTROABS 11.2*  --   --   HGB 14.2 12.6* 14.2  HCT 42.1 38.3* 42.8  MCV 98.4 101.1* 101.2*  PLT 168 169 194    LFT Recent Labs  Lab  01/24/23 0938 01/25/23 0325  AST 24 28  ALT 39 47*  ALKPHOS 68 62  BILITOT 1.2 1.0  PROT 5.4* 5.4*  ALBUMIN 3.1* 2.7*     Antibiotics: Anti-infectives (From admission, onward)    Start     Dose/Rate Route Frequency Ordered Stop   01/25/23 1400  cefTRIAXone (ROCEPHIN) 1 g in sodium chloride 0.9 % 100 mL IVPB        1 g 200 mL/hr over 30 Minutes Intravenous Every 24 hours 01/24/23 1819 01/29/23 1359   01/25/23 1400  azithromycin (ZITHROMAX) 500 mg in sodium chloride 0.9 % 250 mL IVPB        500 mg 250 mL/hr over 60 Minutes Intravenous Every 24 hours 01/24/23 1819 01/29/23 1359   01/24/23 2200  acyclovir (ZOVIRAX) 200 MG capsule 200 mg        200 mg Oral 2 times daily 01/24/23 1509     01/24/23 1400  cefTRIAXone (ROCEPHIN) 1 g in sodium chloride 0.9 % 100 mL IVPB        1 g 200 mL/hr over 30 Minutes Intravenous  Once 01/24/23 1350 01/24/23 1451   01/24/23 1400  azithromycin (ZITHROMAX) 500 mg in sodium chloride 0.9 % 250 mL IVPB        500 mg 250 mL/hr over 60 Minutes Intravenous  Once 01/24/23 1350 01/24/23 1648        DVT prophylaxis: Warfarin  Code Status: Full code  Family Communication: No family at bedside   CONSULTS    Subjective   Patient seen and examined, feels better this morning.  He is off IV Cardizem.   Objective    Physical Examination:   General-appears in no acute distress Heart-S1-S2, irregular, no murmur auscultated Lungs-clear to auscultation bilaterally, no wheezing or crackles auscultated Abdomen-soft, nontender, no organomegaly Extremities-no edema in the lower extremities Neuro-alert, oriented x3, no focal deficit noted   Status is: Inpatient:         Meredeth Ide   [facility] If 7PM-7AM, please contact night-coverage at [email or web address], Office  [phone]   01/26/2023, 11:13 AM  LOS: 2 days

## 2023-01-27 DIAGNOSIS — I2510 Atherosclerotic heart disease of native coronary artery without angina pectoris: Secondary | ICD-10-CM | POA: Diagnosis not present

## 2023-01-27 DIAGNOSIS — I5022 Chronic systolic (congestive) heart failure: Secondary | ICD-10-CM | POA: Diagnosis not present

## 2023-01-27 DIAGNOSIS — C9000 Multiple myeloma not having achieved remission: Secondary | ICD-10-CM | POA: Diagnosis not present

## 2023-01-27 DIAGNOSIS — I482 Chronic atrial fibrillation, unspecified: Secondary | ICD-10-CM | POA: Diagnosis not present

## 2023-01-27 LAB — BASIC METABOLIC PANEL
Anion gap: 6 (ref 5–15)
BUN: 26 mg/dL — ABNORMAL HIGH (ref 8–23)
CO2: 28 mmol/L (ref 22–32)
Calcium: 8.6 mg/dL — ABNORMAL LOW (ref 8.9–10.3)
Chloride: 103 mmol/L (ref 98–111)
Creatinine, Ser: 0.98 mg/dL (ref 0.61–1.24)
GFR, Estimated: >60 mL/min (ref >60)
Glucose, Bld: 102 mg/dL — ABNORMAL HIGH (ref 70–99)
Potassium: 4.1 mmol/L (ref 3.5–5.1)
Sodium: 137 mmol/L (ref 135–145)

## 2023-01-27 LAB — CBC
HCT: 40.2 % (ref 39.0–52.0)
Hemoglobin: 13.2 g/dL (ref 13.0–17.0)
MCH: 32.8 pg (ref 26.0–34.0)
MCHC: 32.8 g/dL (ref 30.0–36.0)
MCV: 100 fL (ref 80.0–100.0)
Platelets: 163 10*3/uL (ref 150–400)
RBC: 4.02 MIL/uL — ABNORMAL LOW (ref 4.22–5.81)
RDW: 14.7 % (ref 11.5–15.5)
WBC: 11.9 10*3/uL — ABNORMAL HIGH (ref 4.0–10.5)
nRBC: 0 % (ref 0.0–0.2)

## 2023-01-27 LAB — CULTURE, RESPIRATORY W GRAM STAIN

## 2023-01-27 LAB — GLUCOSE, CAPILLARY: Glucose-Capillary: 86 mg/dL (ref 70–99)

## 2023-01-27 LAB — PROTIME-INR
INR: 3.5 — ABNORMAL HIGH (ref 0.8–1.2)
Prothrombin Time: 35.5 seconds — ABNORMAL HIGH (ref 11.4–15.2)

## 2023-01-27 MED ORDER — METOPROLOL TARTRATE 50 MG PO TABS
75.0000 mg | ORAL_TABLET | Freq: Two times a day (BID) | ORAL | Status: DC
  Administered 2023-01-27: 75 mg via ORAL
  Filled 2023-01-27 (×2): qty 1

## 2023-01-27 MED ORDER — DIGOXIN 0.25 MG/ML IJ SOLN
0.5000 mg | Freq: Once | INTRAMUSCULAR | Status: AC
  Administered 2023-01-27: 0.5 mg via INTRAVENOUS
  Filled 2023-01-27: qty 2

## 2023-01-27 MED ORDER — WARFARIN 0.5 MG HALF TABLET
0.5000 mg | ORAL_TABLET | Freq: Once | ORAL | Status: AC
  Administered 2023-01-27: 0.5 mg via ORAL
  Filled 2023-01-27: qty 1

## 2023-01-27 MED ORDER — DIGOXIN 125 MCG PO TABS
0.2500 mg | ORAL_TABLET | Freq: Every day | ORAL | Status: DC
  Filled 2023-01-27: qty 2

## 2023-01-27 NOTE — Progress Notes (Addendum)
[facility]  PROGRESS NOTE  Ronald Duran GNF:[id number] DOB: 1941-06-29 DOA: 01/24/2023 PCP: [name], MD   Brief HPI:    82 y.o. 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  Portable 1 view chest radiographs show patchy bibasilar infiltrates and small effusions.    Assessment/Plan:   Community-acquired pneumonia -Started on ceftriaxone and Zithromax -Strep pneumo urinary antigen is positive -Sputum culture growing gram-positive cocci and gram-negative rods -Blood cultures x 2 is negative to date -WBC is down to 11,000  Hypertension -Blood pressure is stable -Started on metoprolol 25 mg p.o. daily. -Dose of metoprolol changed to 50 mg p.o. twice daily as per cardiology   Hyperlipidemia -Continue atorvastatin  Paroxysmal atrial fibrillation with RVR -Started on Cardizem gtt. Cardizem has been weaned off -Continue anticoagulation with warfarin -Continue metoprolol as above; dose of metoprolol changed to 50 mg p.o. 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 -Thrombus appears to be chronic,  first was diagnosed in September 2018, reviewed echo from [facility]. -Cardiology has seen and does not feel that there is a failure of warfarin treatment.  Recommend to continue with warfarin.  Hiatal hernia/esophageal stricture -Continue Protonix 40 mg daily  CAD  -Continue atorvastatin, metoprolol  Chronic systolic heart failure -No signs symptoms of decompensation at this time -Continue metoprolol, will likely benefit from ACE inhibitor/ARB  Multiple myeloma -Patient followed by Dr. Arbutus Ped -Currently on chemotherapy with daratumumab, Velcade, Revlimid and dexamethasone started on 12/05/2022 status post 2 cycle   Non-small cell lung cancer -CT chest showed right lower lobe lung nodule suspicious for non-small cell lung cancer -S/p radiation 2 weeks ago   Medications     acyclovir  200 mg Oral BID   atorvastatin  40 mg Oral QHS   Chlorhexidine Gluconate Cloth  6 each Topical Daily   [START ON 01/28/2023] digoxin  0.25 mg Oral Daily   loratadine  10 mg Oral Daily   metoprolol tartrate  75 mg Oral BID   pantoprazole  40 mg Oral Daily   warfarin  0.5 mg Oral ONCE-1600   Warfarin - Pharmacist Dosing Inpatient   Does not apply q1600     Data Reviewed:   CBG:  Recent Labs  Lab 01/27/23 0734  GLUCAP 86    SpO2: 90 % O2 Flow Rate (L/min): 1 L/min    Vitals:   01/27/23 1000 01/27/23 1200 01/27/23 1205 01/27/23 1300  BP: 120/88 110/67  118/79  Pulse: (!) 53 92  80  Resp: (!) 21 14  (!) 30  Temp:   97.7 F (36.5 C)   TempSrc:   Oral   SpO2: 93% (!) 89%  90%  Weight:      Height:  Data Reviewed:  Basic Metabolic Panel: Recent Labs  Lab 01/24/23 0938 01/24/23 1552 01/25/23 0325 01/27/23 0319  NA 137  --  134* 137  K 4.0  --  4.4 4.1  CL 105  --  103 103  CO2 26  --  22 28  GLUCOSE 123*  --  151* 102*  BUN 16  --  22 26*  CREATININE 0.77  --  0.76 0.98  CALCIUM 9.0  --  8.7* 8.6*  MG  --  2.3  --   --   PHOS  --  2.8  --   --      CBC: Recent Labs  Lab 01/24/23 0938 01/25/23 0325 01/26/23 0324 01/27/23 0319  WBC 12.9* 15.1* 19.0* 11.9*  NEUTROABS 11.2*  --   --   --   HGB 14.2 12.6* 14.2 13.2  HCT 42.1 38.3* 42.8 40.2  MCV 98.4 101.1* 101.2* 100.0  PLT 168 169 194 163    LFT Recent Labs  Lab 01/24/23 0938 01/25/23 0325  AST 24 28  ALT 39 47*  ALKPHOS 68 62  BILITOT 1.2 1.0  PROT 5.4* 5.4*  ALBUMIN 3.1* 2.7*     Antibiotics: Anti-infectives (From admission, onward)    Start     Dose/Rate Route Frequency Ordered Stop   01/25/23 1400  cefTRIAXone (ROCEPHIN) 1 g in sodium chloride 0.9 % 100 mL IVPB        1 g 200 mL/hr over 30 Minutes Intravenous Every 24 hours 01/24/23 1819 01/29/23 1359   01/25/23 1400  azithromycin (ZITHROMAX) 500 mg in sodium chloride 0.9 % 250 mL IVPB        500 mg 250 mL/hr over 60 Minutes Intravenous Every 24 hours 01/24/23 1819 01/29/23 1359   01/24/23 2200  acyclovir (ZOVIRAX) 200 MG capsule 200 mg        200 mg Oral 2 times daily 01/24/23 1509     01/24/23 1400  cefTRIAXone (ROCEPHIN) 1 g in sodium chloride 0.9 % 100 mL IVPB        1 g 200 mL/hr over 30 Minutes Intravenous  Once 01/24/23 1350 01/24/23 1451   01/24/23 1400  azithromycin (ZITHROMAX) 500 mg in sodium chloride 0.9 % 250 mL IVPB        500 mg 250 mL/hr over 60 Minutes Intravenous  Once 01/24/23 1350 01/24/23 1648        DVT prophylaxis: Warfarin  Code Status: Full code  Family Communication: No family at bedside   CONSULTS    Subjective   Patient seen and examined, still heart rate in 120s to 130s on ambulation.  Breathing is improved.   Objective    Physical Examination:   Appears in no acute distress S1-S2, regular Lungs clear to auscultation bilaterally Extremities no edema   Status is: Inpatient:         Meredeth Ide   [facility] If 7PM-7AM, please contact night-coverage at [email or web address], Office  [phone]   01/27/2023, 1:59 PM  LOS: 3 days

## 2023-01-27 NOTE — Progress Notes (Addendum)
Rounding Note    Patient Name: Ronald Duran Date of Encounter: 01/27/2023  [facility] Cardiologist: [name], MD  --> patient wants to switch to Dr. Eden Emms  Subjective   Telemetry with HR now in the 80-90s, but now sitting up in the 120s and 140s with minimal activity. He feels well today.  Inpatient Medications    Scheduled Meds:  acyclovir  200 mg Oral BID   atorvastatin  40 mg Oral QHS   Chlorhexidine Gluconate Cloth  6 each Topical Daily   loratadine  10 mg Oral Daily   metoprolol tartrate  50 mg Oral BID   pantoprazole  40 mg Oral Daily   Warfarin - Pharmacist Dosing Inpatient   Does not apply q1600   Continuous Infusions:  azithromycin Stopped (01/26/23 1509)   cefTRIAXone (ROCEPHIN)  IV Stopped (01/26/23 1401)   PRN Meds: acetaminophen **OR** acetaminophen, guaiFENesin-dextromethorphan, levalbuterol, nitroGLYCERIN, ondansetron **OR** ondansetron (ZOFRAN) IV, mouth rinse   Vital Signs    Vitals:   01/27/23 0200 01/27/23 0300 01/27/23 0333 01/27/23 0400  BP:    113/77  Pulse: (!) 108 73  91  Resp: (!) 21 (!) 9  12  Temp:   (!) 97.5 F (36.4 C)   TempSrc:   Oral   SpO2: 96% 96%  (!) 89%  Weight:      Height:        Intake/Output Summary (Last 24 hours) at 01/27/2023 0807 Last data filed at 01/26/2023 2136 Gross per 24 hour  Intake 466.1 ml  Output --  Net 466.1 ml      01/24/2023    4:00 PM 01/24/2023   11:33 AM 01/24/2023   10:00 AM  Last 3 Weights  Weight (lbs) 184 lb 11.9 oz 187 lb 187 lb 6.4 oz  Weight (kg) 83.8 kg 84.823 kg 85.004 kg      Telemetry    Afib 80-90s sleeping, 120s sitting in bed, 140s going to the bathroom - Personally Reviewed  ECG    No new tracings - Personally Reviewed  Physical Exam   GEN: No acute distress.   Neck: No JVD Cardiac: iR iR, no murmurs, rubs, or gallops.  Respiratory: crackles in left base GI: Soft, nontender, non-distended  MS: B LE edema L > R Neuro:  Nonfocal  Psych:  Normal affect   Labs    High Sensitivity Troponin:  No results for input(s): "TROPONINIHS" in the last 720 hours.   Chemistry Recent Labs  Lab 01/24/23 0938 01/24/23 1552 01/25/23 0325 01/27/23 0319  NA 137  --  134* 137  K 4.0  --  4.4 4.1  CL 105  --  103 103  CO2 26  --  22 28  GLUCOSE 123*  --  151* 102*  BUN 16  --  22 26*  CREATININE 0.77  --  0.76 0.98  CALCIUM 9.0  --  8.7* 8.6*  MG  --  2.3  --   --   PROT 5.4*  --  5.4*  --   ALBUMIN 3.1*  --  2.7*  --   AST 24  --  28  --   ALT 39  --  47*  --   ALKPHOS 68  --  62  --   BILITOT 1.2  --  1.0  --   GFRNONAA >60  --  >60 >60  ANIONGAP 6  --  9 6    Lipids No results for input(s): "CHOL", "TRIG", "HDL", "  LABVLDL", "LDLCALC", "CHOLHDL" in the last 168 hours.  Hematology Recent Labs  Lab 01/25/23 0325 01/26/23 0324 01/27/23 0319  WBC 15.1* 19.0* 11.9*  RBC 3.79* 4.23 4.02*  HGB 12.6* 14.2 13.2  HCT 38.3* 42.8 40.2  MCV 101.1* 101.2* 100.0  MCH 33.2 33.6 32.8  MCHC 32.9 33.2 32.8  RDW 14.6 14.8 14.7  PLT 169 194 163   Thyroid  Recent Labs  Lab 01/24/23 1848  TSH 0.186*    BNPNo results for input(s): "BNP", "PROBNP" in the last 168 hours.  DDimer No results for input(s): "DDIMER" in the last 168 hours.   Radiology    ECHOCARDIOGRAM COMPLETE  Result Date: 01/25/2023    ECHOCARDIOGRAM REPORT   Patient Name:   Ronald Duran Date of Exam: 01/25/2023 Medical Rec #:  [id number]        Height:       72.0 in Accession #:    [id number]       Weight:       184.7 lb Date of Birth:  1941/01/27       BSA:          2.060 m Patient Age:    81 years         BP:           94/68 mmHg Patient Gender: M                HR:           92 bpm. Exam Location:  Inpatient Procedure: 2D Echo, Cardiac Doppler, Color Doppler and Intracardiac            Opacification Agent Indications:     I48.91* Unspeicified atrial fibrillation  History:         Patient has prior history of Echocardiogram examinations, most                  recent  10/21/2021. CHF and Cardiomyopathy, CAD and Previous                  Myocardial Infarction, Abnormal ECG, Stroke, Arrythmias:Atrial                  Fibrillation and Bradycardia; Risk Factors:Hypertension and                  Dyslipidemia. Metastatic cancer.  Sonographer:     Sheralyn Boatman RDCS Referring Phys:  [id number] DAVID Kelby Fam ORTIZ Diagnosing Phys: Armanda Magic MD  Sonographer Comments: Suboptimal parasternal window. IMPRESSIONS  1. Left ventricular ejection fraction, by estimation, is 35-40%. The left ventricle has normal function. The left ventricle has no regional wall motion abnormalities. The left ventricular internal cavity size was moderately dilated. Left ventricular diastolic function could not be evaluated. There is akinesis of the left ventricular, inferior wall, lateral wall and anterior wall. There is akinesis of the left ventricular, apical segment. 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  2. Right ventricular systolic function is normal. The right ventricular size is normal. Tricuspid regurgitation signal is inadequate for assessing PA pressure.  3. The mitral valve is normal in structure. Trivial mitral valve regurgitation. No evidence of mitral stenosis.  4. The aortic valve was not well visualized. Aortic valve regurgitation is not visualized. Aortic valve sclerosis/calcification is present, without any evidence of aortic stenosis.  5. The inferior vena cava is dilated in size with >50% respiratory variability, suggesting right atrial pressure of 8 mmHg. FINDINGS  Left Ventricle: Left ventricular ejection fraction, by estimation, is 35 to 40%. The left ventricle has moderately decreased function. The left ventricle demonstrates regional wall motion abnormalities. Definity contrast agent was given IV to delineate the left ventricular endocardial borders. The left ventricular internal cavity size was moderately dilated. There is no left  ventricular hypertrophy. Left ventricular diastolic function could not be evaluated due to atrial fibrillation. Left ventricular diastolic function could not be evaluated. Right Ventricle: The right ventricular size is normal. No increase in right ventricular wall thickness. Right ventricular systolic function is normal. Tricuspid regurgitation signal is inadequate for assessing PA pressure. Left Atrium: Left atrial size was normal in size. Right Atrium: Right atrial size was normal in size. Pericardium: There is no evidence of pericardial effusion. Mitral Valve: The mitral valve is normal in structure. Trivial mitral valve regurgitation. No evidence of mitral valve stenosis. Tricuspid Valve: The tricuspid valve is normal in structure. Tricuspid valve regurgitation is not demonstrated. No evidence of tricuspid stenosis. Aortic Valve: The aortic valve was not well visualized. Aortic valve regurgitation is not visualized. Aortic valve sclerosis/calcification is present, without any evidence of aortic stenosis. Pulmonic Valve: The pulmonic valve was normal in structure. Pulmonic valve regurgitation is not visualized. No evidence of pulmonic stenosis. Aorta: The aortic root is normal in size and structure. Venous: The inferior vena cava is dilated in size with greater than 50% respiratory variability, suggesting right atrial pressure of 8 mmHg. IAS/Shunts: No atrial level shunt detected by color flow Doppler.  LEFT VENTRICLE PLAX 2D LVIDd:         6.00 cm LVIDs:         5.20 cm LV PW:         0.90 cm LV IVS:        0.90 cm LVOT diam:     2.10 cm LV SV:         62 LV SV Index:   30 LVOT Area:     3.46 cm  LV Volumes (MOD) LV vol d, MOD A2C: 146.0 ml LV vol d, MOD A4C: 114.5 ml LV vol s, MOD A2C: 100.0 ml LV vol s, MOD A4C: 99.6 ml LV SV MOD A2C:     46.0 ml LV SV MOD A4C:     114.5 ml LV SV MOD BP:      32.0 ml RIGHT VENTRICLE            IVC RV S prime:     6.74 cm/s  IVC diam: 2.60 cm TAPSE (M-mode): 0.9 cm LEFT ATRIUM              Index        RIGHT ATRIUM           Index LA diam:        4.70 cm 2.28 cm/m   RA Area:     14.50 cm LA Vol (A2C):   53.0 ml 25.73 ml/m  RA Volume:   31.40 ml  15.24 ml/m LA Vol (A4C):   32.4 ml 15.73 ml/m LA Biplane Vol: 41.1 ml 19.95 ml/m  AORTIC VALVE LVOT Vmax:   105.00 cm/s LVOT Vmean:  73.500 cm/s LVOT VTI:    0.180 m  AORTA Ao Root diam: 3.10 cm Ao Asc diam:  3.40 cm MITRAL VALVE MV Area (PHT): 4.39 cm     SHUNTS MV Decel Time: 173 msec     Systemic VTI:  0.18 m MV E velocity: 108.00 cm/s  Systemic Diam: 2.10  cm Armanda Magic MD Electronically signed by Armanda Magic MD Signature Date/Time: 01/25/2023/10:04:18 AM    Final (Updated)     Cardiac Studies   Echo 01/25/23:  1. Left ventricular ejection fraction, by estimation, is 35-40%. The left  ventricle has normal function. The left ventricle has no regional wall  motion abnormalities. The left ventricular internal cavity size was  moderately dilated. Left ventricular  diastolic function could not be evaluated. There is akinesis of the left  ventricular, inferior wall, lateral wall and anterior wall. There is  akinesis of the left ventricular, apical segment. 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   2. Right ventricular systolic function is normal. The right ventricular  size is normal. Tricuspid regurgitation signal is inadequate for assessing  PA pressure.   3. The mitral valve is normal in structure. Trivial mitral valve  regurgitation. No evidence of mitral stenosis.   4. The aortic valve was not well visualized. Aortic valve regurgitation  is not visualized. Aortic valve sclerosis/calcification is present,  without any evidence of aortic stenosis.   5. The inferior vena cava is dilated in size with >50% respiratory  variability, suggesting right atrial pressure of 8 mmHg.   Patient Profile     82 y.o. male with a hx of CAD, HLD, NSCLC s/p radiation  therapy, chronic systolic heart failure with hypotension, PAF on OAC with coumadin who is being seen 01/26/2023 for the evaluation of mural thrombus and possible coumadin failure   Assessment & Plan    Abnormal echo Chronic coumadin therapy - reviewed by Dr. Eden Emms, does not feel this is consistent with mural thrombus - would not consider this a coumadin failure - had TIA in 2018, high stroke risk with Afib - continue chronic coumadin therapy - suspect subtherapeutic INR in May 2024 due to recent lovenox bridge and restarting coumadin dosing   Persistent atrial fibrillation - initially controlled with IV cardizem - now off given HFrEF - torpol held and lopressor started for rate control - suspect RVR likely due to PNA - rates today better controlled with 50 mg lopressor BID, but still 140s with any movement - can consider additional metoprolol - will watch rates today   Chronic systolic heart failure Felt ischemic cardiomyopathy given heart disease - echo yesterday with LVEF reduced from 45% to 35-40% - GDMT: toprol, will consolidate lopressor prior to discharge - may use PRN lasix - suspect SOB/cough related to PNA   NSCLC - lytic bone lesions - receiving palliative radiation     Multiple myeloma - receiving daratumumab, velcade, revlimid, and dexamethasone      CAP ABX per primary   Disposition He inquires about discharge - I think he likely can't discharge today as his HR still in the 140s with minimal activity. Will increase to 50 mg lopressor TID to support rate while he continues to clear PNA. They wish to discharge in order to receive chemo on Monday - but we discussed that his Afib needs to be better rate controlled prior to discharge. They are in agreement.      For questions or updates, please contact [facility] Please consult [email or web address] for contact info under        Signed, Marcelino Duster, PA  01/27/2023, 8:07 AM

## 2023-01-27 NOTE — Progress Notes (Signed)
ANTICOAGULATION CONSULT NOTE - Initial Consult  Pharmacy Consult for warfarin Indication: atrial fibrillation  No Known Allergies  Patient Measurements: Height: 6' (182.9 cm) Weight: 83.8 kg (184 lb 11.9 oz) IBW/kg (Calculated) : 77.6  Vital Signs: Temp: 97.4 F (36.3 C) (06/21 0839) Temp Source: Oral (06/21 0839) BP: 112/84 (06/21 0900) Pulse Rate: 64 (06/21 0900)  Labs: Recent Labs    01/25/23 0325 01/26/23 0324 01/26/23 0325 01/27/23 0319  HGB 12.6* 14.2  --  13.2  HCT 38.3* 42.8  --  40.2  PLT 169 194  --  163  LABPROT 30.8*  --  33.6* 35.5*  INR 2.9*  --  3.3* 3.5*  CREATININE 0.76  --   --  0.98     Estimated Creatinine Clearance: 67.1 mL/min (by C-G formula based on SCr of 0.98 mg/dL).   Medical History: Past Medical History:  Diagnosis Date   Atrial fibrillation (HCC)    CAD (coronary artery disease)    a. s/p MI with LAD stent placement December, 1999 -last stress test November 2008 with no EKG evidence of ischemia  b. cath 07/23/2015 after abnormal ETT and nuc, occluded mid LAD stent with good distal collateral, medical therapy    Cardiomyopathy (HCC)    Ischemic cardiomyopathy with EF 40% by echo 2005   COVID 2021   mild case   Dyslipidemia    Erectile dysfunction    GERD (gastroesophageal reflux disease)    Hiatal hernia    with esophageal strictures and dilatations in the past - Dr. [name]   History of nuclear stress test    Myoview 12/16: EF 47%, anterior, anteroseptal and inferoseptal defect suggesting infarct with peri-infarct ischemia, intermediate risk   HTN (hypertension)    Hx of seasonal allergies    Hyperlipidemia    Myocardial infarction (HCC) 08/01/1998   PAD (peripheral artery disease) (HCC)    Pre-diabetes    Prostate cancer (HCC)    with seed implant therapy following Lupron therapy in 2008 and 2009 - followed by Dr. Isabel Caprice   Ringworm    Stroke (HCC) 04/20/2017   seen on a MRI   Vitamin D deficiency     Medications:   Prior to admission medications include warfarin Last dose: 6/17 Per [facility]: INR Goal: 2-3 Warfarin regimen: 2.5 mg every Mon, Wed, Fri; 5 mg all other days Pt has also been started on ceftriaxone and azithromycin, which potentially increase warfarin effects   Assessment: Pharmacy consulted to dose warfarin for this 82 yo male admitted with afib RVR and CAP sent from cancer center where he is being treated for multiple myeloma.  History includes embolic stroke and afib on long term use of warfarin.    Today, 01/27/23 INR supra-therapeutic at 3.5 (yesterday's warfarin dose=1.5 mg, INR up 0.2 from yesterday despite lower warfarin dose administered, on IV antibiotics) Hgb WNL, plt WNL  Meal intake not documented  Goal of Therapy:  INR 2-3 Monitor platelets by anticoagulation protocol: Yes   Plan:  Warfarin 0.5 mg x 1 today Daily INR for warfarin dose Monitor CBC, signs/symptoms of bleeding     Thank you for allowing pharmacy to be a part of this patient's care.  Selinda Eon, PharmD, BCPS Clinical Pharmacist [place] 01/27/2023 10:15 AM

## 2023-01-28 DIAGNOSIS — I5022 Chronic systolic (congestive) heart failure: Secondary | ICD-10-CM | POA: Diagnosis not present

## 2023-01-28 DIAGNOSIS — I482 Chronic atrial fibrillation, unspecified: Secondary | ICD-10-CM | POA: Diagnosis not present

## 2023-01-28 DIAGNOSIS — I255 Ischemic cardiomyopathy: Secondary | ICD-10-CM

## 2023-01-28 DIAGNOSIS — I4891 Unspecified atrial fibrillation: Secondary | ICD-10-CM | POA: Diagnosis not present

## 2023-01-28 DIAGNOSIS — I236 Thrombosis of atrium, auricular appendage, and ventricle as current complications following acute myocardial infarction: Secondary | ICD-10-CM

## 2023-01-28 DIAGNOSIS — J189 Pneumonia, unspecified organism: Secondary | ICD-10-CM | POA: Diagnosis not present

## 2023-01-28 LAB — CBC
HCT: 44 % (ref 39.0–52.0)
Hemoglobin: 14.5 g/dL (ref 13.0–17.0)
MCH: 32.2 pg (ref 26.0–34.0)
MCHC: 33 g/dL (ref 30.0–36.0)
MCV: 97.6 fL (ref 80.0–100.0)
Platelets: 167 10*3/uL (ref 150–400)
RBC: 4.51 MIL/uL (ref 4.22–5.81)
RDW: 14.6 % (ref 11.5–15.5)
WBC: 10.7 10*3/uL — ABNORMAL HIGH (ref 4.0–10.5)
nRBC: 0 % (ref 0.0–0.2)

## 2023-01-28 LAB — COMPREHENSIVE METABOLIC PANEL
ALT: 94 U/L — ABNORMAL HIGH (ref 0–44)
AST: 36 U/L (ref 15–41)
Albumin: 2.8 g/dL — ABNORMAL LOW (ref 3.5–5.0)
Alkaline Phosphatase: 70 U/L (ref 38–126)
Anion gap: 4 — ABNORMAL LOW (ref 5–15)
BUN: 22 mg/dL (ref 8–23)
CO2: 24 mmol/L (ref 22–32)
Calcium: 8.6 mg/dL — ABNORMAL LOW (ref 8.9–10.3)
Chloride: 104 mmol/L (ref 98–111)
Creatinine, Ser: 0.82 mg/dL (ref 0.61–1.24)
GFR, Estimated: >60 mL/min (ref >60)
Glucose, Bld: 95 mg/dL (ref 70–99)
Potassium: 3.7 mmol/L (ref 3.5–5.1)
Sodium: 132 mmol/L — ABNORMAL LOW (ref 135–145)
Total Bilirubin: 1.1 mg/dL (ref 0.3–1.2)
Total Protein: 5.5 g/dL — ABNORMAL LOW (ref 6.5–8.1)

## 2023-01-28 LAB — CULTURE, BLOOD (ROUTINE X 2): Special Requests: ADEQUATE

## 2023-01-28 LAB — PROTIME-INR
INR: 2.3 — ABNORMAL HIGH (ref 0.8–1.2)
Prothrombin Time: 25.7 seconds — ABNORMAL HIGH (ref 11.4–15.2)

## 2023-01-28 MED ORDER — METOPROLOL TARTRATE 50 MG PO TABS
50.0000 mg | ORAL_TABLET | Freq: Two times a day (BID) | ORAL | Status: DC
  Administered 2023-01-28: 50 mg via ORAL

## 2023-01-28 MED ORDER — AMOXICILLIN-POT CLAVULANATE 875-125 MG PO TABS: 1.0000 | ORAL_TABLET | Freq: Two times a day (BID) | ORAL | 0 refills | Status: AC

## 2023-01-28 MED ORDER — METOPROLOL TARTRATE 50 MG PO TABS: 50.0000 mg | ORAL_TABLET | Freq: Two times a day (BID) | ORAL | 2 refills | Status: DC

## 2023-01-28 NOTE — Discharge Summary (Addendum)
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 *  Hospital Course:  82 y.o. 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  Portable 1 view chest radiographs show patchy bibasilar infiltrates and small effusions   Assessment and Plan:  Community-acquired pneumonia -Started on ceftriaxone and Zithromax -Strep pneumo urinary antigen is positive -Sputum culture growing gram-positive cocci and gram-negative rods -Blood cultures x 2 is negative to date -WBC is down to 10.7 -Will discharge on Augmentin 1 tablet p.o. twice daily for 3 more days to complete 7 days of treatment   Hypertension -Blood pressure is stable -Started on metoprolol 25 mg p.o. daily. -Dose of metoprolol changed to 50 mg p.o. twice  daily as per cardiology     Hyperlipidemia -Continue atorvastatin   Paroxysmal atrial fibrillation with RVR -Started on Cardizem gtt. 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 -Thrombus appears to be chronic, first was diagnosed in September 2018, reviewed echo from [facility]. -Cardiology has seen and does not feel that there is a failure of warfarin treatment.  Recommend to continue with warfarin.   Hiatal hernia/esophageal stricture -Continue Protonix 40 mg daily   CAD  -Continue atorvastatin, metoprolol   Chronic systolic heart failure -No signs symptoms of decompensation at this time -Continue metoprolol   Multiple myeloma -Patient followed by Dr. Arbutus Ped -Currently on chemotherapy with daratumumab, Velcade, Revlimid and dexamethasone started on 12/05/2022 status post 2 cycle    Non-small cell lung cancer -CT chest showed right lower lobe lung nodule suspicious for non-small cell lung cancer -S/p radiation 2 weeks ago            Consultants: Cardiology Procedures performed: None Disposition: Home Diet recommendation:  Heart healthy diet DISCHARGE MEDICATION: Allergies as of 01/28/2023   No Known Allergies      Medication List     STOP taking these medications    metoprolol succinate 25 MG 24 hr tablet Commonly known as: Toprol XL       TAKE these medications    acyclovir 200 MG capsule Commonly known as: ZOVIRAX Take 1 capsule (  200 mg total) by mouth 2 (two) times daily.   amoxicillin-clavulanate 875-125 MG tablet Commonly known as: AUGMENTIN Take 1 tablet by mouth 2 (two) times daily  for 3 days.   atorvastatin 40 MG tablet Commonly known as: LIPITOR Take 40 mg by mouth at bedtime.   B Complex 100 tablet Take 1 tablet by mouth daily. Balance   Centrum tablet Take 1 tablet by mouth daily.   cetirizine 10 MG tablet Commonly known as: ZyrTEC Allergy Take 1 tablet (10 mg total) by mouth daily.   Co Q-10 100 MG Caps Take 100 mg by mouth daily with supper.   dexamethasone 4 MG tablet Commonly known as: DECADRON 5 tab weekly start with the first dose of chemotherapy. What changed:  how much to take how to take this when to take this additional instructions   lenalidomide 15 MG capsule Commonly known as: REVLIMID Take 1 capsule (15 mg total) by mouth daily. Take for 14 days, then none for 7 days. Repeat every 21 days. Start Revlimid on day 1 of chemotherapy Auth #=[id number] date obtained 12/19/2022 Adult Male.   metoprolol tartrate 50 MG tablet Commonly known as: LOPRESSOR Take 1 tablet (50 mg total) by mouth 2 (two) times daily.   nitroGLYCERIN 0.4 MG SL tablet Commonly known as: NITROSTAT PLACE 1 TABLET UNDER THE TONGUE IF NEEDED EVERY 5 MINUTES FOR CHEST PAIN FOR 3 DOSES IF NO RELIEF AFTER FIRST DOSE CALL 911. What changed:  how much to take how to take this when to take this reasons to take this additional instructions   nystatin powder Commonly known as: MYCOSTATIN/NYSTOP Apply 1 Application topically 3 (three) times daily.   omeprazole 20 MG capsule Commonly known as: PRILOSEC Take 20 mg by mouth daily before breakfast.   prochlorperazine 10 MG tablet Commonly known as: COMPAZINE Take 1 tablet (10 mg total) by mouth every 6 (six) hours as needed for nausea or vomiting.   promethazine-dextromethorphan 6.25-15 MG/5ML syrup Commonly known as: PROMETHAZINE-DM Take 2.5 mLs by mouth 3 (three) times daily as needed for cough.   triamcinolone ointment 0.5 % Commonly known as: KENALOG Apply 1 Application topically 2 (two) times daily. What  changed:  when to take this reasons to take this   Vitamin D3 50 MCG (2000 UT) Tabs Take 2,000 Units by mouth daily.   warfarin 5 MG tablet Commonly known as: COUMADIN Take as directed. If you are unsure how to take this medication, talk to your nurse or doctor. Original instructions: TAKE 1/2 TO 1 TABLET BY MOUTH DAILY AS DIRECTED BY COUMADIN CLINIC What changed:  how much to take how to take this when to take this additional instructions        Follow-up Information     [name], MD Follow up in 1 week(s).   Specialty: Internal Medicine Contact information: [address] [place] [address] [phone]         Croitoru, Rachelle Hora, MD. Schedule an appointment as soon as possible for a visit.   Specialty: Cardiology Contact information: [address] [place] [address] [phone]                Discharge Exam: Ceasar Mons Weights   01/24/23 1133 01/24/23 1600 01/27/23 1731  Weight: 84.8 kg 83.8 kg 84.4 kg   General-appears in no acute distress Heart-S1-S2, regular, no murmur auscultated Lungs-clear to auscultation bilaterally, no wheezing or crackles auscultated Abdomen-soft, nontender, no organomegaly Extremities-no edema in the lower  extremities Neuro-alert, oriented x3, no focal deficit noted  Condition at discharge: Good  The results of significant diagnostics from this hospitalization (including imaging, microbiology, ancillary and laboratory) are listed below for reference.   Imaging Studies: ECHOCARDIOGRAM COMPLETE  Result Date: 01/25/2023    ECHOCARDIOGRAM REPORT   Patient Name:   Ronald Duran Date of Exam: 01/25/2023 Medical Rec #:  [id number]        Height:       72.0 in Accession #:    [id number]       Weight:       184.7 lb Date of Birth:  02-06-1941       BSA:          2.060 m Patient Age:    81 years         BP:           94/68 mmHg Patient Gender: M                HR:           92 bpm. Exam  Location:  Inpatient Procedure: 2D Echo, Cardiac Doppler, Color Doppler and Intracardiac            Opacification Agent Indications:     I48.91* Unspeicified atrial fibrillation  History:         Patient has prior history of Echocardiogram examinations, most                  recent 10/21/2021. CHF and Cardiomyopathy, CAD and Previous                  Myocardial Infarction, Abnormal ECG, Stroke, Arrythmias:Atrial                  Fibrillation and Bradycardia; Risk Factors:Hypertension and                  Dyslipidemia. Metastatic cancer.  Sonographer:     Sheralyn Boatman RDCS Referring Phys:  [id number] DAVID Kelby Fam ORTIZ Diagnosing Phys: Armanda Magic MD  Sonographer Comments: Suboptimal parasternal window. IMPRESSIONS  1. Left ventricular ejection fraction, by estimation, is 35-40%. The left ventricle has normal function. The left ventricle has no regional wall motion abnormalities. The left ventricular internal cavity size was moderately dilated. Left ventricular diastolic function could not be evaluated. There is akinesis of the left ventricular, inferior wall, lateral wall and anterior wall. There is akinesis of the left ventricular, apical segment. 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  2. Right ventricular systolic function is normal. The right ventricular size is normal. Tricuspid regurgitation signal is inadequate for assessing PA pressure.  3. The mitral valve is normal in structure. Trivial mitral valve regurgitation. No evidence of mitral stenosis.  4. The aortic valve was not well visualized. Aortic valve regurgitation is not visualized. Aortic valve sclerosis/calcification is present, without any evidence of aortic stenosis.  5. The inferior vena cava is dilated in size with >50% respiratory variability, suggesting right atrial pressure of 8 mmHg. FINDINGS  Left Ventricle: Left ventricular ejection fraction, by estimation, is 35 to 40%. The left ventricle  has moderately decreased function. The left ventricle demonstrates regional wall motion abnormalities. Definity contrast agent was given IV to delineate the left ventricular endocardial borders. The left ventricular internal cavity size was moderately dilated. There is no left ventricular hypertrophy. Left ventricular diastolic function could not be evaluated due to atrial fibrillation. Left ventricular diastolic function could  not be evaluated. Right Ventricle: The right ventricular size is normal. No increase in right ventricular wall thickness. Right ventricular systolic function is normal. Tricuspid regurgitation signal is inadequate for assessing PA pressure. Left Atrium: Left atrial size was normal in size. Right Atrium: Right atrial size was normal in size. Pericardium: There is no evidence of pericardial effusion. Mitral Valve: The mitral valve is normal in structure. Trivial mitral valve regurgitation. No evidence of mitral valve stenosis. Tricuspid Valve: The tricuspid valve is normal in structure. Tricuspid valve regurgitation is not demonstrated. No evidence of tricuspid stenosis. Aortic Valve: The aortic valve was not well visualized. Aortic valve regurgitation is not visualized. Aortic valve sclerosis/calcification is present, without any evidence of aortic stenosis. Pulmonic Valve: The pulmonic valve was normal in structure. Pulmonic valve regurgitation is not visualized. No evidence of pulmonic stenosis. Aorta: The aortic root is normal in size and structure. Venous: The inferior vena cava is dilated in size with greater than 50% respiratory variability, suggesting right atrial pressure of 8 mmHg. IAS/Shunts: No atrial level shunt detected by color flow Doppler.  LEFT VENTRICLE PLAX 2D LVIDd:         6.00 cm LVIDs:         5.20 cm LV PW:         0.90 cm LV IVS:        0.90 cm LVOT diam:     2.10 cm LV SV:         62 LV SV Index:   30 LVOT Area:     3.46 cm  LV Volumes (MOD) LV vol d, MOD A2C: 146.0  ml LV vol d, MOD A4C: 114.5 ml LV vol s, MOD A2C: 100.0 ml LV vol s, MOD A4C: 99.6 ml LV SV MOD A2C:     46.0 ml LV SV MOD A4C:     114.5 ml LV SV MOD BP:      32.0 ml RIGHT VENTRICLE            IVC RV S prime:     6.74 cm/s  IVC diam: 2.60 cm TAPSE (M-mode): 0.9 cm LEFT ATRIUM             Index        RIGHT ATRIUM           Index LA diam:        4.70 cm 2.28 cm/m   RA Area:     14.50 cm LA Vol (A2C):   53.0 ml 25.73 ml/m  RA Volume:   31.40 ml  15.24 ml/m LA Vol (A4C):   32.4 ml 15.73 ml/m LA Biplane Vol: 41.1 ml 19.95 ml/m  AORTIC VALVE LVOT Vmax:   105.00 cm/s LVOT Vmean:  73.500 cm/s LVOT VTI:    0.180 m  AORTA Ao Root diam: 3.10 cm Ao Asc diam:  3.40 cm MITRAL VALVE MV Area (PHT): 4.39 cm     SHUNTS MV Decel Time: 173 msec     Systemic VTI:  0.18 m MV E velocity: 108.00 cm/s  Systemic Diam: 2.10 cm Armanda Magic MD Electronically signed by Armanda Magic MD Signature Date/Time: 01/25/2023/10:04:18 AM    Final (Updated)    DG Chest Portable 1 View  Result Date: 01/24/2023 CLINICAL DATA:  cough EXAM: PORTABLE CHEST - 1 VIEW COMPARISON:  01/14/2023 FINDINGS: Patchy interstitial and airspace opacities in both lung bases, new since previous. Blunting of the lateral costophrenic angles. Heart size upper limits normal. Aortic Atherosclerosis (ICD10-170.0). Visualized bones  unremarkable. IMPRESSION: Patchy bibasilar infiltrates and small effusions. Electronically Signed   By: Corlis Leak M.D.   On: 01/24/2023 12:56   DG Chest 2 View  Result Date: 01/14/2023 CLINICAL DATA:  Productive cough EXAM: CHEST - 2 VIEW COMPARISON:  Prior chest x-ray 11/15/2022 FINDINGS: Interval development of small bilateral pleural effusions. No focal airspace infiltrate. Diffuse mild bronchitic changes are similar compared to prior. Prior T10 cement augmentation. No acute osseous abnormality. IMPRESSION: Small bilateral pleural effusions are new compared to prior chest x-ray from 11/15/2022. No focal airspace infiltrate to suggest  bacterial pneumonia. Diffuse mild bronchitic changes are similar compared to prior. Electronically Signed   By: Malachy Moan M.D.   On: 01/14/2023 12:55   XR FEMUR MIN 2 VIEWS LEFT  Result Date: 01/12/2023 X-rays demonstrate stable alignment of the hardware without interval change   Microbiology: Results for orders placed or performed during the hospital encounter of 01/24/23  Resp panel by RT-PCR (RSV, Flu A&B, Covid) Anterior Nasal Swab     Status: None   Collection Time: 01/24/23 12:09 PM   Specimen: Anterior Nasal Swab  Result Value Ref Range Status   SARS Coronavirus 2 by RT PCR NEGATIVE NEGATIVE Final    Comment: (NOTE) SARS-CoV-2 target nucleic acids are NOT DETECTED.  The SARS-CoV-2 RNA is generally detectable in upper respiratory specimens during the acute phase of infection. The lowest concentration of SARS-CoV-2 viral copies this assay can detect is 138 copies/mL. A negative result does not preclude SARS-Cov-2 infection and should not be used as the sole basis for treatment or other patient management decisions. A negative result may occur with  improper specimen collection/handling, submission of specimen other than nasopharyngeal swab, presence of viral mutation(s) within the areas targeted by this assay, and inadequate number of viral copies(<138 copies/mL). A negative result must be combined with clinical observations, patient history, and epidemiological information. The expected result is Negative.  Fact Sheet for Patients:  BloggerCourse.com  Fact Sheet for Healthcare Providers:  SeriousBroker.it  This test is no t yet approved or cleared by the [place] FDA and  has been authorized for detection and/or diagnosis of SARS-CoV-2 by FDA under an Emergency Use Authorization (EUA). This EUA will remain  in effect (meaning this test can be used) for the duration of the COVID-19 declaration under Section  564(b)(1) of the Act, 21 U.S.C.section 360bbb-3(b)(1), unless the authorization is terminated  or revoked sooner.       Influenza A by PCR NEGATIVE NEGATIVE Final   Influenza B by PCR NEGATIVE NEGATIVE Final    Comment: (NOTE) The Xpert Xpress SARS-CoV-2/FLU/RSV plus assay is intended as an aid in the diagnosis of influenza from Nasopharyngeal swab specimens and should not be used as a sole basis for treatment. Nasal washings and aspirates are unacceptable for Xpert Xpress SARS-CoV-2/FLU/RSV testing.  Fact Sheet for Patients: BloggerCourse.com  Fact Sheet for Healthcare Providers: SeriousBroker.it  This test is not yet approved or cleared by the [place] FDA and has been authorized for detection and/or diagnosis of SARS-CoV-2 by FDA under an Emergency Use Authorization (EUA). This EUA will remain in effect (meaning this test can be used) for the duration of the COVID-19 declaration under Section 564(b)(1) of the Act, 21 U.S.C. section 360bbb-3(b)(1), unless the authorization is terminated or revoked.     Resp Syncytial Virus by PCR NEGATIVE NEGATIVE Final    Comment: (NOTE) Fact Sheet for Patients: BloggerCourse.com  Fact Sheet for Healthcare Providers: SeriousBroker.it  This test is  not yet approved or cleared by the [place] and has been authorized for detection and/or diagnosis of SARS-CoV-2 by FDA under an Emergency Use Authorization (EUA). This EUA will remain in effect (meaning this test can be used) for the duration of the COVID-19 declaration under Section 564(b)(1) of the Act, 21 U.S.C. section 360bbb-3(b)(1), unless the authorization is terminated or revoked.  Performed at [facility], [address], [place], [place] [address]   Blood culture (routine x 2)     Status: None (Preliminary result)   Collection Time: 01/24/23  2:15  PM   Specimen: BLOOD  Result Value Ref Range Status   Specimen Description   Final    BLOOD LEFT ANTECUBITAL Performed at [facility], [address], [place], [place] [address]    Special Requests   Final    BOTTLES DRAWN AEROBIC AND ANAEROBIC Blood Culture adequate volume Performed at [facility], [address] [place], [place] [address]    Culture   Final    NO GROWTH 4 DAYS Performed at [facility], [address], [place], [place] [address]    Report Status PENDING  Incomplete  Blood culture (routine x 2)     Status: None (Preliminary result)   Collection Time: 01/24/23  3:52 PM   Specimen: BLOOD RIGHT ARM  Result Value Ref Range Status   Specimen Description   Final    BLOOD RIGHT ARM Performed at [facility], [address] [place], [place] [address]    Special Requests   Final    BOTTLES DRAWN AEROBIC AND ANAEROBIC Blood Culture adequate volume Performed at [facility], [address] [place], [place] [address]    Culture   Final    NO GROWTH 4 DAYS Performed at [facility], [address], [place], [place] [address]    Report Status PENDING  Incomplete  MRSA Next Gen by PCR, Nasal     Status: None   Collection Time: 01/24/23  7:03 PM   Specimen: Nasal Mucosa; Nasal Swab  Result Value Ref Range Status   MRSA by PCR Next Gen NOT DETECTED NOT DETECTED Final    Comment: (NOTE) The GeneXpert MRSA Assay (FDA approved for NASAL specimens only), is one component of a comprehensive MRSA colonization surveillance program. It is not intended to diagnose MRSA infection nor to guide or monitor treatment for MRSA infections. Test performance is not FDA approved in patients less than 95 years old. Performed at [name], [address] [place], [place] [address]   Expectorated Sputum Assessment w Gram Stain, Rflx to Resp Cult     Status: None   Collection Time:  01/24/23  8:52 PM   Specimen: Expectorated Sputum  Result Value Ref Range Status   Specimen Description EXPECTORATED SPUTUM  Final   Special Requests NONE  Final   Sputum evaluation   Final    THIS SPECIMEN IS ACCEPTABLE FOR SPUTUM CULTURE Performed at [facility], [address] [place], [place] [address]    Report Status 01/24/2023 FINAL  Final  Culture, Respiratory w Gram Stain     Status: None   Collection Time: 01/24/23  8:52 PM  Result Value Ref Range Status   Specimen Description   Final    EXPECTORATED SPUTUM Performed at [facility], [address], [place], [place] [address]    Special Requests   Final  NONE Reflexed from [id number] Performed at [facility], [address], [place], [place] [address]    Gram Stain   Final    RARE WBC PRESENT, PREDOMINANTLY PMN FEW GRAM POSITIVE COCCI FEW GRAM NEGATIVE RODS    Culture   Final    FEW Normal respiratory flora-no Staph aureus or Pseudomonas seen Performed at [facility], [address] [place], [place] [address]    Report Status 01/27/2023 FINAL  Final    Labs: CBC: Recent Labs  Lab 01/24/23 0938 01/25/23 0325 01/26/23 0324 01/27/23 0319 01/28/23 0535  WBC 12.9* 15.1* 19.0* 11.9* 10.7*  NEUTROABS 11.2*  --   --   --   --   HGB 14.2 12.6* 14.2 13.2 14.5  HCT 42.1 38.3* 42.8 40.2 44.0  MCV 98.4 101.1* 101.2* 100.0 97.6  PLT 168 169 194 163 167   Basic Metabolic Panel: Recent Labs  Lab 01/24/23 0938 01/24/23 1552 01/25/23 0325 01/27/23 0319 01/28/23 0535  NA 137  --  134* 137 132*  K 4.0  --  4.4 4.1 3.7  CL 105  --  103 103 104  CO2 26  --  22 28 24   GLUCOSE 123*  --  151* 102* 95  BUN 16  --  22 26* 22  CREATININE 0.77  --  0.76 0.98 0.82  CALCIUM 9.0  --  8.7* 8.6* 8.6*  MG  --  2.3  --   --   --   PHOS  --  2.8  --   --   --    Liver Function Tests: Recent Labs  Lab 01/24/23 0938 01/25/23 0325 01/28/23 0535  AST 24 28 36   ALT 39 47* 94*  ALKPHOS 68 62 70  BILITOT 1.2 1.0 1.1  PROT 5.4* 5.4* 5.5*  ALBUMIN 3.1* 2.7* 2.8*   CBG: Recent Labs  Lab 01/27/23 0734  GLUCAP 86    Discharge time spent: greater than 30 minutes.  Signed: Meredeth Ide, MD [facility] 01/28/2023

## 2023-01-28 NOTE — TOC Transition Note (Signed)
Transition of Care [facility]) - CM/SW Discharge Note   Patient Details  Name: Ronald Duran MRN: [id number] Date of Birth: Dec 31, 1940  [facility] CM/SW Contact:  Adrian Prows, RN Phone Number: 01/28/2023, 11:56 AM   Clinical Narrative:    SDOH risk identified; spoke w/ pt and wife in room; pt says he has transportation; no TOC needs.   Final next level of care: Home/Self Care Barriers to Discharge: No Barriers Identified   Patient Goals and CMS Choice      Discharge Placement                         Discharge Plan and Services Additional resources added to the After Visit Summary for                                       Social Determinants of Health (SDOH) Interventions SDOH Screenings   Food Insecurity: No Food Insecurity (01/24/2023)  Housing: Low Risk  (01/24/2023)  Transportation Needs: No Transportation Needs (01/28/2023)  Recent Concern: Transportation Needs - Unmet Transportation Needs (01/24/2023)  Utilities: Not At Risk (01/24/2023)  Depression (PHQ2-9): Low Risk  (10/12/2022)  Tobacco Use: Low Risk  (01/24/2023)     Readmission Risk Interventions    01/25/2023    2:03 PM  Readmission Risk Prevention Plan  PCP or Specialist Appt within 3-5 Days Complete

## 2023-01-28 NOTE — Progress Notes (Signed)
Rounding Note    Patient Name: Ronald Duran Date of Encounter: 01/28/2023  [facility] Cardiologist: [name], MD   Subjective   He feels well and is eager to go home.  Telemetry shows that resting heart rate is in the 70-90 range and stays under 120 bpm with physical exercise.  Inpatient Medications    Scheduled Meds:  acyclovir  200 mg Oral BID   atorvastatin  40 mg Oral QHS   Chlorhexidine Gluconate Cloth  6 each Topical Daily   loratadine  10 mg Oral Daily   metoprolol tartrate  50 mg Oral BID   pantoprazole  40 mg Oral Daily   Warfarin - Pharmacist Dosing Inpatient   Does not apply q1600   Continuous Infusions:  azithromycin Stopped (01/27/23 1925)   cefTRIAXone (ROCEPHIN)  IV Stopped (01/27/23 1703)   PRN Meds: acetaminophen **OR** acetaminophen, guaiFENesin-dextromethorphan, levalbuterol, nitroGLYCERIN, ondansetron **OR** ondansetron (ZOFRAN) IV, mouth rinse   Vital Signs    Vitals:   01/27/23 2135 01/27/23 2136 01/28/23 0120 01/28/23 0507  BP: (!) 116/96 (!) 116/96 119/81 132/86  Pulse: 87 89 85 98  Resp: 20 19  18   Temp: 97.6 F (36.4 C) 97.6 F (36.4 C) 97.6 F (36.4 C) 97.9 F (36.6 C)  TempSrc: Oral Oral Oral Oral  SpO2: 96% 96% 93% 93%  Weight:      Height:        Intake/Output Summary (Last 24 hours) at 01/28/2023 0809 Last data filed at 01/28/2023 0655 Gross per 24 hour  Intake 120 ml  Output 2050 ml  Net -1930 ml      01/27/2023    5:31 PM 01/24/2023    4:00 PM 01/24/2023   11:33 AM  Last 3 Weights  Weight (lbs) 186 lb 1.1 oz 184 lb 11.9 oz 187 lb  Weight (kg) 84.4 kg 83.8 kg 84.823 kg      Telemetry    Atrial fibrillation with controlled ventricular rate- Personally Reviewed  ECG    No new tracing- Personally Reviewed  Physical Exam  Appears well.  Lying almost fully supine in his recliner without respiratory difficulty GEN: No acute distress.   Neck: No JVD Cardiac: Irregular, no murmurs, rubs,  or gallops.  Respiratory: Clear to auscultation bilaterally. GI: Soft, nontender, non-distended  MS: No edema; No deformity. Neuro:  Nonfocal  Psych: Normal affect   Labs    High Sensitivity Troponin:  No results for input(s): "TROPONINIHS" in the last 720 hours.   Chemistry Recent Labs  Lab 01/24/23 0938 01/24/23 1552 01/25/23 0325 01/27/23 0319 01/28/23 0535  NA 137  --  134* 137 132*  K 4.0  --  4.4 4.1 3.7  CL 105  --  103 103 104  CO2 26  --  22 28 24   GLUCOSE 123*  --  151* 102* 95  BUN 16  --  22 26* 22  CREATININE 0.77  --  0.76 0.98 0.82  CALCIUM 9.0  --  8.7* 8.6* 8.6*  MG  --  2.3  --   --   --   PROT 5.4*  --  5.4*  --  5.5*  ALBUMIN 3.1*  --  2.7*  --  2.8*  AST 24  --  28  --  36  ALT 39  --  47*  --  94*  ALKPHOS 68  --  62  --  70  BILITOT 1.2  --  1.0  --  1.1  GFRNONAA >60  --  >60 >60 >60  ANIONGAP 6  --  9 6 4*    Lipids No results for input(s): "CHOL", "TRIG", "HDL", "LABVLDL", "LDLCALC", "CHOLHDL" in the last 168 hours.  Hematology Recent Labs  Lab 01/26/23 0324 01/27/23 0319 01/28/23 0535  WBC 19.0* 11.9* 10.7*  RBC 4.23 4.02* 4.51  HGB 14.2 13.2 14.5  HCT 42.8 40.2 44.0  MCV 101.2* 100.0 97.6  MCH 33.6 32.8 32.2  MCHC 33.2 32.8 33.0  RDW 14.8 14.7 14.6  PLT 194 163 167   Thyroid  Recent Labs  Lab 01/24/23 1848  TSH 0.186*    BNPNo results for input(s): "BNP", "PROBNP" in the last 168 hours.  DDimer No results for input(s): "DDIMER" in the last 168 hours.   Radiology    No results found.  Cardiac Studies   Echo 01/25/23:  1. Left ventricular ejection fraction, by estimation, is 35-40%. The left  ventricle has normal function. The left ventricle has no regional wall  motion abnormalities. The left ventricular internal cavity size was  moderately dilated. Left ventricular  diastolic function could not be evaluated. There is akinesis of the left  ventricular, inferior wall, lateral wall and anterior wall. There is  akinesis  of the left ventricular, apical segment. 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   2. Right ventricular systolic function is normal. The right ventricular  size is normal. Tricuspid regurgitation signal is inadequate for assessing  PA pressure.   3. The mitral valve is normal in structure. Trivial mitral valve  regurgitation. No evidence of mitral stenosis.   4. The aortic valve was not well visualized. Aortic valve regurgitation  is not visualized. Aortic valve sclerosis/calcification is present,  without any evidence of aortic stenosis.   5. The inferior vena cava is dilated in size with >50% respiratory  variability, suggesting right atrial pressure of 8 mmHg.   Patient Profile     82 y.o. 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  Ventricular rate control is quite satisfactory.  Stop digoxin due to potential toxicity.  I think his heart rate will continue to become easier to control as his pneumonia heals.  Will send home on metoprolol succinate 50 mg twice daily.  If this is well-tolerated without the bradycardia, he will also benefit from the higher dose of beta-blocker  as treatment for systolic dysfunction and ischemic heart disease.  If he develops symptomatic bradycardia can reduce back to his previous dose of metoprolol as an outpatient. Additional adjustments to long-term treatment for systolic heart failure is best performed after resolution of the pneumonia. Warfarin is effective in preventing embolic stroke from atrial fibrillation as well as for prevention of progression of his left ventricular thrombus.  It is also the preferable anticoagulant in the setting of  multiple myeloma on treatment with Revlimid. [facility] will sign off.   Medication Recommendations: Metoprolol succinate 50 mg twice daily.  Continue warfarin with INR adjusted 2.0-3.0. Continue other cardiac medications unchanged. Other recommendations (labs, testing, etc): Per primary team Follow up as an outpatient: Has an appointment with Anice Paganini, ER on August 12  For questions or updates, please contact [facility] Please consult [email or web address] for  contact info under        Signed, Thurmon Fair, MD  01/28/2023, 8:09 AM

## 2023-01-28 NOTE — Plan of Care (Signed)
  Problem: Respiratory: Goal: Ability to maintain a clear airway will improve Outcome: Progressing   Problem: Activity: Goal: Ability to tolerate increased activity will improve Outcome: Progressing   Problem: Safety: Goal: Ability to remain free from injury will improve Outcome: Progressing   Problem: Coping: Goal: Level of anxiety will decrease Outcome: Progressing

## 2023-01-28 NOTE — Progress Notes (Signed)
Mobility Specialist - Progress Note   01/28/23 0858  Mobility  Activity Ambulated independently in hallway  Level of Assistance Modified independent, requires aide device or extra time  Assistive Device Cane  Distance Ambulated (ft) 500 ft  Range of Motion/Exercises Active  Activity Response Tolerated well  Mobility Referral Yes  $Mobility charge 1 Mobility  Mobility Specialist Start Time (ACUTE ONLY) 0848  Mobility Specialist Stop Time (ACUTE ONLY) 0858  Mobility Specialist Time Calculation (min) (ACUTE ONLY) 10 min   Pt was found on recliner chair and agreeable to ambulate. Had no complaints during session and at EOS returned to room with all needs met.  Ronald Duran Mobility Specialist

## 2023-01-29 LAB — CULTURE, BLOOD (ROUTINE X 2)

## 2023-01-30 ENCOUNTER — Telehealth: Payer: Self-pay | Admitting: Internal Medicine

## 2023-01-30 ENCOUNTER — Other Ambulatory Visit: Payer: Self-pay | Admitting: Physician Assistant

## 2023-01-30 ENCOUNTER — Inpatient Hospital Stay: Payer: Medicare Other

## 2023-01-30 VITALS — BP 101/73 | HR 74 | Temp 98.1°F | Resp 16 | Ht 72.0 in | Wt 183.5 lb

## 2023-01-30 DIAGNOSIS — C9000 Multiple myeloma not having achieved remission: Secondary | ICD-10-CM

## 2023-01-30 DIAGNOSIS — C3431 Malignant neoplasm of lower lobe, right bronchus or lung: Secondary | ICD-10-CM | POA: Diagnosis not present

## 2023-01-30 DIAGNOSIS — Z5112 Encounter for antineoplastic immunotherapy: Secondary | ICD-10-CM | POA: Diagnosis present

## 2023-01-30 LAB — CMP (CANCER CENTER ONLY)
ALT: 67 U/L — ABNORMAL HIGH (ref 0–44)
AST: 28 U/L (ref 15–41)
Albumin: 3.3 g/dL — ABNORMAL LOW (ref 3.5–5.0)
Alkaline Phosphatase: 78 U/L (ref 38–126)
Anion gap: 4 — ABNORMAL LOW (ref 5–15)
BUN: 19 mg/dL (ref 8–23)
CO2: 28 mmol/L (ref 22–32)
Calcium: 9 mg/dL (ref 8.9–10.3)
Chloride: 105 mmol/L (ref 98–111)
Creatinine: 0.95 mg/dL (ref 0.61–1.24)
GFR, Estimated: >60 mL/min (ref >60)
Glucose, Bld: 107 mg/dL — ABNORMAL HIGH (ref 70–99)
Potassium: 4.5 mmol/L (ref 3.5–5.1)
Sodium: 137 mmol/L (ref 135–145)
Total Bilirubin: 1 mg/dL (ref 0.3–1.2)
Total Protein: 5.4 g/dL — ABNORMAL LOW (ref 6.5–8.1)

## 2023-01-30 LAB — CBC WITH DIFFERENTIAL (CANCER CENTER ONLY)
Abs Immature Granulocytes: 0.07 10*3/uL (ref 0.00–0.07)
Basophils Absolute: 0.1 10*3/uL (ref 0.0–0.1)
Basophils Relative: 1 %
Eosinophils Absolute: 0.1 10*3/uL (ref 0.0–0.5)
Eosinophils Relative: 1 %
HCT: 43.2 % (ref 39.0–52.0)
Hemoglobin: 14.8 g/dL (ref 13.0–17.0)
Immature Granulocytes: 1 %
Lymphocytes Relative: 4 %
Lymphs Abs: 0.5 10*3/uL — ABNORMAL LOW (ref 0.7–4.0)
MCH: 33.6 pg (ref 26.0–34.0)
MCHC: 34.3 g/dL (ref 30.0–36.0)
MCV: 98.2 fL (ref 80.0–100.0)
Monocytes Absolute: 0.4 10*3/uL (ref 0.1–1.0)
Monocytes Relative: 4 %
Neutro Abs: 9.9 10*3/uL — ABNORMAL HIGH (ref 1.7–7.7)
Neutrophils Relative %: 89 %
Platelet Count: 186 10*3/uL (ref 150–400)
RBC: 4.4 MIL/uL (ref 4.22–5.81)
RDW: 14.6 % (ref 11.5–15.5)
WBC Count: 10.9 10*3/uL — ABNORMAL HIGH (ref 4.0–10.5)
nRBC: 0 % (ref 0.0–0.2)

## 2023-01-30 MED ORDER — ACETAMINOPHEN 325 MG PO TABS
650.0000 mg | ORAL_TABLET | Freq: Once | ORAL | Status: AC
  Administered 2023-01-30: 650 mg via ORAL
  Filled 2023-01-30: qty 2

## 2023-01-30 MED ORDER — DIPHENHYDRAMINE HCL 25 MG PO CAPS
50.0000 mg | ORAL_CAPSULE | Freq: Once | ORAL | Status: AC
  Administered 2023-01-30: 50 mg via ORAL
  Filled 2023-01-30: qty 2

## 2023-01-30 MED ORDER — DARATUMUMAB-HYALURONIDASE-FIHJ 1800-30000 MG-UT/15ML ~~LOC~~ SOLN
1800.0000 mg | Freq: Once | SUBCUTANEOUS | Status: AC
  Administered 2023-01-30: 1800 mg via SUBCUTANEOUS
  Filled 2023-01-30: qty 15

## 2023-01-30 NOTE — Telephone Encounter (Signed)
Patient wants provider switch from Dr. Izora Ribas to Dr. Eden Emms.

## 2023-01-30 NOTE — Patient Instructions (Signed)
[facility]  Discharge Instructions: Thank you for choosing [facility] to provide your oncology and hematology care.   If you have a lab appointment with the Cancer Center, please go directly to the Cancer Center and check in at the registration area.   Wear comfortable clothing and clothing appropriate for easy access to any Portacath or PICC line.   We strive to give you quality time with your provider. You may need to reschedule your appointment if you arrive late (15 or more minutes).  Arriving late affects you and other patients whose appointments are after yours.  Also, if you miss three or more appointments without notifying the office, you may be dismissed from the clinic at the provider's discretion.      For prescription refill requests, have your pharmacy contact our office and allow 72 hours for refills to be completed.    Today you received the following chemotherapy and/or immunotherapy agents darzalex faspro      To help prevent nausea and vomiting after your treatment, we encourage you to take your nausea medication as directed.  BELOW ARE SYMPTOMS THAT SHOULD BE REPORTED IMMEDIATELY: *FEVER GREATER THAN 100.4 F (38 C) OR HIGHER *CHILLS OR SWEATING *NAUSEA AND VOMITING THAT IS NOT CONTROLLED WITH YOUR NAUSEA MEDICATION *UNUSUAL SHORTNESS OF BREATH *UNUSUAL BRUISING OR BLEEDING *URINARY PROBLEMS (pain or burning when urinating, or frequent urination) *BOWEL PROBLEMS (unusual diarrhea, constipation, pain near the anus) TENDERNESS IN MOUTH AND THROAT WITH OR WITHOUT PRESENCE OF ULCERS (sore throat, sores in mouth, or a toothache) UNUSUAL RASH, SWELLING OR PAIN  UNUSUAL VAGINAL DISCHARGE OR ITCHING   Items with * indicate a potential emergency and should be followed up as soon as possible or go to the Emergency Department if any problems should occur.  Please show the CHEMOTHERAPY ALERT CARD or IMMUNOTHERAPY ALERT CARD at  check-in to the Emergency Department and triage nurse.  Should you have questions after your visit or need to cancel or reschedule your appointment, please contact [facility]  Dept: [phone]  and follow the prompts.  Office hours are 8:00 a.m. to 4:30 p.m. Monday - Friday. Please note that voicemails left after 4:00 p.m. may not be returned until the following business day.  We are closed weekends and major holidays. You have access to a nurse at all times for urgent questions. Please call the main number to the clinic Dept: [phone] and follow the prompts.   For any non-urgent questions, you may also contact your provider using MyChart. We now offer e-Visits for anyone 18 and older to request care online for non-urgent symptoms. For details visit [email or web address].   Also download the MyChart app! Go to the app store, search "MyChart", open the app, select , and log in with your MyChart username and password.  

## 2023-01-31 ENCOUNTER — Ambulatory Visit
Admission: RE | Admit: 2023-01-31 | Discharge: 2023-01-31 | Disposition: A | Discharge status: Home / Self Care | Payer: Medicare Other | Source: Ambulatory Visit | Attending: Internal Medicine | Admitting: Internal Medicine

## 2023-01-31 DIAGNOSIS — C3431 Malignant neoplasm of lower lobe, right bronchus or lung: Secondary | ICD-10-CM | POA: Insufficient documentation

## 2023-01-31 NOTE — Progress Notes (Signed)
  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 (as documented in provider EOT note)   The patient was available for call today.   Symptoms of fatigue have improved since completing therapy.  Symptoms of skin changes have improved since completing therapy.  Symptoms of esophagitis have improved since completing therapy.  The patient has scheduled follow up with his medical oncologist Dr. Arbutus Ped for ongoing care, and was encouraged to call if he  develops concerns or questions regarding radiation.   This concludes the interaction.  Ruel Favors, LPN

## 2023-02-01 ENCOUNTER — Encounter: Payer: Self-pay | Admitting: Internal Medicine

## 2023-02-02 NOTE — Progress Notes (Signed)
Cardiology Office Note:    Date:  02/03/2023   ID:  Ronald Duran, DOB March 16, 1941, MRN [id number]  PCP:  Farris Has, MD   [facility] Providers Cardiologist:  [name], MD     Referring MD: [name], MD   Chief Complaint: post hospital follow-up  History of Present Illness:    Ronald Duran is a very pleasant 82 y.o. male with a hx of persistent atrial fibrillation on chronic anticoagulation with Coumadin, CAD, ICM, HTN, HLD, CVA, PAD with predominantly right leg claudication, diagnosed with multiple myeloma March 2024.   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  Abnormal nuclear stress test 07/2015. Left heart catheterization 07/2015 with total occlusion of mid LAD after the first diagonal within the previously stented segment.  Well-developed apical collaterals from the dominant right coronary. Widely patent dominant RCA and circumflex arteries.  Akinesis of the apical segment with intramyocardial calcification, EF 45 to 50%.  Medical therapy recommended. 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  He has maintained consistent follow-up. LV function has been stable in the 40 to 45% range for several years and he has remained very active.  Atrial fibrillation detected incidentally September 2023 by his PCP.  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 No palpitations or change in his exercise tolerance.  Seen by [name] on 05/19/2022 at which time HR was 87 bpm but with history of bradycardia, further up titration of BB was avoided. Plan to closely monitor LV function with new onset atrial fibrillation. Had DCCV 06/03/22 but quickly reverted back to AF.  Referred to EP and seen by Dr. Nelly Laurence on 07/05/22. Was scheduled to undergo a fib ablation 08/2022. Concern for bronchogenic neoplasm identified on CT cardiac morph on  08/30/22 and ablation was cancelled. Urgent referral to pulmonology placed.   PET scan identified suspicious osseous findings. 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 He was referred to oncology. He underwent T10 vertebral body core biopsy as well as radiofrequency ablation and balloon kyphoplasty 10/11/2022. Ultimately diagnosed with multiple myeloma and right lower lobe pulmonary nodule suspicious for non-small cell lung cancer.   Last cardiology clinic visit was 12/22/22 with Dr. Raynelle Jan. Undergoing radiation therapy for NSCLC, chemo on hold for thrombocytopenia.  Advised he could continue warfarin per oncology team.  He was hypotensive.  Amlodipine and benazepril were stopped. Advised to d/c metoprolol if SBP remained < 100 mmHg in one week.  Admission 6/18-6/22/24 for AF RVR from cancer center. TTE 01/25/2023 revealed LVEF 35 to 40%, no RWMA, akinesis of left ventricle, inferior wall, lateral wall, and anterior wall. Akinesis of apical segment with aneurysmal area with concern for thrombus, normal RV size and function, trivial MR, aortic valve sclerosis with no evidence of stenosis. Echo reviewed by Dr. Eden Emms did not feel consistent with mural thrombus. Found to have PNA, felt to be contributing to elevated HR. Was on digoxin briefly during admission but concern for toxicity with chemo. Discharged on metoprolol 50 mg BID for rate control and continued stroke prevention on coumadin.   Today, he is here with his wife for hospital follow-up. Since hip stabilization surgery he has not been as active and has LLE edema, less in right LE. Wears compression  stockings daily. Reports feeling well since hospital discharge. Is trying to increase the distance and time he spends walking daily. Home SBP 103 to 122 mmHg and DBP 67-88 mmHg.  He is not particularly symptomatic with atrial  fibrillation. Continues to undergo treatment for lung cancer and does not know if he will require further treatment of multiple myeloma. He denies chest pain, shortness of breath, fatigue, palpitations, melena, weakness, presyncope, syncope, orthopnea, and PND.   Past Medical History:  Diagnosis Date   Atrial fibrillation (HCC)    CAD (coronary artery disease)    a. s/p MI with LAD stent placement December, 1999 -last stress test November 2008 with no EKG evidence of ischemia  b. cath 07/23/2015 after abnormal ETT and nuc, occluded mid LAD stent with good distal collateral, medical therapy    Cardiomyopathy (HCC)    Ischemic cardiomyopathy with EF 40% by echo 2005   COVID 2021   mild case   Dyslipidemia    Erectile dysfunction    GERD (gastroesophageal reflux disease)    Hiatal hernia    with esophageal strictures and dilatations in the past - Dr. [name]   History of nuclear stress test    Myoview 12/16: EF 47%, anterior, anteroseptal and inferoseptal defect suggesting infarct with peri-infarct ischemia, intermediate risk   HTN (hypertension)    Hx of seasonal allergies    Hyperlipidemia    Myocardial infarction (HCC) 08/01/1998   PAD (peripheral artery disease) (HCC)    Pre-diabetes    Prostate cancer (HCC)    with seed implant therapy following Lupron therapy in 2008 and 2009 - followed by Dr. Pollyann Glen    Stroke (HCC) 04/20/2017   seen on a MRI   Vitamin D deficiency     Past Surgical History:  Procedure Laterality Date   BRONCHIAL BIOPSY  11/15/2022   Procedure: BRONCHIAL BIOPSIES;  Surgeon: [name], DO;  Location: [facility]OS[facility];  Service: Pulmonary;;   BRONCHIAL BRUSHINGS  11/15/2022   Procedure: BRONCHIAL BRUSHINGS;  Surgeon: [name], DO;  Location: [facility];  Service: Pulmonary;;   BRONCHIAL NEEDLE ASPIRATION BIOPSY  11/15/2022   Procedure: BRONCHIAL NEEDLE ASPIRATION BIOPSIES;  Surgeon: [name], DO;  Location: [facility]OS[facility];   Service: Pulmonary;;   CARDIAC CATHETERIZATION N/A 07/23/2015   Procedure: Left Heart Cath and Coronary Angiography;  Surgeon: [name], MD;  Location: [facility] INVASIVE CV LAB;  Service: Cardiovascular;  Laterality: N/A;   CARDIOVERSION N/A 06/03/2022   Procedure: CARDIOVERSION;  Surgeon: [name], MD;  Location: [facility] ENDOSCOPY;  Service: Cardiovascular;  Laterality: N/A;   COLONOSCOPY WITH PROPOFOL N/A 11/24/2015   Procedure: COLONOSCOPY WITH PROPOFOL;  Surgeon: [name], MD;  Location: [facility] ENDOSCOPY;  Service: Endoscopy;  Laterality: N/A;   esophageal strictures with dilatations in the past per Dr. [name]     last '98   FIDUCIAL MARKER PLACEMENT  11/15/2022   Procedure: FIDUCIAL MARKER PLACEMENT;  Surgeon: [name], DO;  Location: [facility] ENDOS[facility];  Service: Pulmonary;;   HERNIA REPAIR Left    INTRAMEDULLARY (IM) NAIL INTERTROCHANTERIC Left 10/25/2022   Procedure: LEFT INTRAMEDULLARY (IM) NAIL INTERTROCHANTERIC;  Surgeon: [name], MD;  Location: [facility] OR;  Service: Orthopedics;  Laterality: Left;   IR BONE MARROW BIOPSY & ASPIRATION  11/17/2022   IR BONE TUMOR(S)RF ABLATION  10/11/2022   IR KYPHO THORACIC WITH BONE BIOPSY  10/11/2022   LAD stent placement - Dr. [name]  left inguinal hernia repair - Dr. [name]     prostatic radioactive seed implantation - Dr. [name]     '09    Current Medications: Current Meds  Medication Sig   acyclovir (ZOVIRAX) 200 MG capsule Take 1 capsule (200 mg total) by mouth 2 (two) times daily.   atorvastatin (LIPITOR) 40 MG tablet Take 40 mg by mouth at bedtime.   B Complex Vitamins (B COMPLEX 100) tablet Take 1 tablet by mouth daily. Balance   cetirizine (ZYRTEC ALLERGY) 10 MG tablet Take 1 tablet (10 mg total) by mouth daily.   Cholecalciferol (VITAMIN D3) 2000 UNITS TABS Take 2,000 Units by mouth daily.   Coenzyme Q10 (CO Q-10) 100 MG CAPS Take 100 mg by mouth daily with supper.   dexamethasone (DECADRON) 4 MG tablet 5 tab  weekly start with the first dose of chemotherapy. (Patient taking differently: Take 20 mg by mouth See admin instructions. Take 5 tablets by mouth weekly start with the first dose of chemotherapy. Patient states he did take today because he thought maybe he would have Chemo today per patient)   metoprolol tartrate (LOPRESSOR) 50 MG tablet Take 1 tablet (50 mg total) by mouth 2 (two) times daily.   Multiple Vitamins-Minerals (CENTRUM) tablet Take 1 tablet by mouth daily.   nitroGLYCERIN (NITROSTAT) 0.4 MG SL tablet PLACE 1 TABLET UNDER THE TONGUE IF NEEDED EVERY 5 MINUTES FOR CHEST PAIN FOR 3 DOSES IF NO RELIEF AFTER FIRST DOSE CALL 911. (Patient taking differently: Place 0.4 mg under the tongue every 5 (five) minutes as needed for chest pain.)   omeprazole (PRILOSEC) 20 MG capsule Take 20 mg by mouth daily before breakfast.   prochlorperazine (COMPAZINE) 10 MG tablet Take 1 tablet (10 mg total) by mouth every 6 (six) hours as needed for nausea or vomiting.   triamcinolone ointment (KENALOG) 0.5 % Apply 1 Application topically 2 (two) times daily. (Patient taking differently: Apply 1 Application topically daily as needed (For left foot rash).)   warfarin (COUMADIN) 5 MG tablet TAKE 1/2 TO 1 TABLET BY MOUTH DAILY AS DIRECTED BY COUMADIN CLINIC (Patient taking differently: Take 2.5-5 mg by mouth See admin instructions. Take 2.5 mg by mouth on Monday, Wednesday and Friday and take 5 mg on Tuesday, Thursday, Saturday and Sunday)     Allergies:   Patient has no known allergies.   Social History   Socioeconomic History   Marital status: Married    Spouse name: ,Talbert Forest   Number of children: 4   Years of education: Not on file   Highest education level: Not on file  Occupational History   Not on file  Tobacco Use   Smoking status: Never    Passive exposure: Past   Smokeless tobacco: Never  Vaping Use   Vaping Use: Never used  Substance and Sexual Activity   Alcohol use: Not Currently   Drug  use: No   Sexual activity: Not Currently  Other Topics Concern   Not on file  Social History Narrative   Not on file   Social Determinants of Health   Financial Resource Strain: Not on file  Food Insecurity: No Food Insecurity (01/24/2023)   Hunger Vital Sign    Worried About Running Out of Food in the Last Year: Never true    Ran Out of Food in the Last Year: Never true  Transportation Needs: No Transportation Needs (01/28/2023)   PRAPARE - Administrator, Civil Service (Medical): No  Lack of Transportation (Non-Medical): No  Recent Concern: Transportation Needs - Unmet Transportation Needs (01/24/2023)   PRAPARE - Administrator, Civil Service (Medical): Yes    Lack of Transportation (Non-Medical): No  Physical Activity: Not on file  Stress: Not on file  Social Connections: Not on file     Family History: The patient's family history includes CAD in his sister; CVA in his mother; CVA (age of onset: 72) in his father.  ROS:   Please see the history of present illness.   All other systems reviewed and are negative.  Labs/Other Studies Reviewed:    The following studies were reviewed today:  Echo Complete 01/25/23 LVEF 35-40%, no rwma, unable to evaluate diastolic function Akinesis of LV, inferior wall, lateral wall, and anterior wall.  Akinesis of apical segment.  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 Eden Emms) Trivial MR, aortic valve sclerosis without stenosis   Echo 10/21/21 1. Left ventricular ejection fraction, by estimation, is 45 to 50%. The  left ventricle has mildly decreased function. The left ventricle  demonstrates regional wall motion abnormalities (see scoring  diagram/findings for description). Left ventricular  diastolic parameters are indeterminate. There is moderate hypokinesis of  the left ventricular, mid-apical inferior wall. There is of the  left  ventricular, apical apical segment.   2. Right ventricular systolic function is normal. The right ventricular  size is normal. There is normal pulmonary artery systolic pressure.   3. Left atrial size was moderately dilated.   4. Right atrial size was mild to moderately dilated.   5. The mitral valve is normal in structure. Trivial mitral valve  regurgitation. No evidence of mitral stenosis.   6. The aortic valve is tricuspid. There is mild calcification of the  aortic valve. Aortic valve regurgitation is trivial. Aortic valve  sclerosis is present, with no evidence of aortic valve stenosis.   7. The inferior vena cava is normal in size with greater than 50%  respiratory variability, suggesting right atrial pressure of 3 mmHg.   Comparison(s): Prior images unable to be directly viewed, comparison made  by report only.   Conclusion(s)/Recommendation(s): Mildly reduced LVEF with akinetic apex  and hypokinetic mid to distal inferior wall. No LV thormbus visualized  with echo contrast.  LHC 07/24/15  Mid LAD lesion, 99% stenosed. The lesion was previously treated with a stent (bare metal). LAD receives collaterals from the right coronary Mid LAD to Dist LAD lesion, 80% stenosed.   Total occlusion of the mid LAD after the first diagonal within the previously stented segment. Well-developed apical collaterals from the dominant right coronary. Widely patent dominant RCA and circumflex coronary arteries. Akinesis of the apical segment with intramyocardial calcification. EF 45-50%. The anatomy as described above explains the abnormalities noted on the recent myocardial perfusion study. In the absence of symptoms, conservative medical management will continue.   Recommendations:   Continue aggressive risk factor modification Continue walking program Notify if any anginal symptoms.   Lexiscan Myoview 07/13/15 Nuclear stress EF: 47%. There was no ST segment deviation noted during  stress. Findings consistent with prior myocardial infarction with peri-infarct ischemia. This is an intermediate risk study. The left ventricular ejection fraction is mildly decreased (45-54%).   1. There was a large, severe mid to apical anterior, anteroseptal, and inferoseptal perfusion defect.  This defect was primarily fixed with some reversibility, suggesting infarction with peri-infarct ischemia.  2. EF 47%  with peri-apical hypokinesis.  3. Intermediate risk study (primarily infarction).   Recent Labs: 01/24/2023: Magnesium 2.3; TSH 0.186 01/30/2023: ALT 67; BUN 19; Creatinine 0.95; Hemoglobin 14.8; Platelet Count 186; Potassium 4.5; Sodium 137  Recent Lipid Panel From [facility] 05/02/22 LDL 66, HDL 51, trigs 91   Risk Assessment/Calculations:    CHA2DS2-VASc Score = 7   This indicates a 11.2% annual risk of stroke. The patient's score is based upon: CHF History: 1 HTN History: 1 Diabetes History: 0 Stroke History: 2 Vascular Disease History: 1 Age Score: 2 Gender Score: 0    Physical Exam:    VS:  BP 114/70   Pulse 67   Ht 6' (1.829 m)   Wt 179 lb 12.8 oz (81.6 kg)   SpO2 97%   BMI 24.39 kg/m     Wt Readings from Last 3 Encounters:  02/03/23 179 lb 12.8 oz (81.6 kg)  01/30/23 183 lb 8 oz (83.2 kg)  01/27/23 186 lb 1.1 oz (84.4 kg)     GEN:  Well nourished, well developed in no acute distress HEENT: Normal NECK: No JVD; No carotid bruits CARDIAC: Irregular RR, no murmurs, rubs, gallops RESPIRATORY:  Clear to auscultation without rales, wheezing or rhonchi  ABDOMEN: Soft, non-tender, non-distended MUSCULOSKELETAL:  No edema; No deformity. 2+ pedal pulses, equal bilaterally SKIN: Warm and dry NEUROLOGIC:  Alert and oriented x 3 PSYCHIATRIC:  Normal affect   EKG:   EKG Interpretation Date/Time:  Friday February 03 2023 10:35:37 EDT Ventricular Rate:  94 PR Interval:    QRS Duration:  94 QT Interval:  336 QTC Calculation: 420 R Axis:   99  Text  Interpretation: Atrial fibrillation /atrial flutter Anterolateral infarct , age undetermined When compared with ECG of 24-Jan-2023 11:44, PREVIOUS ECG IS PRESENT Confirmed by Eligha Bridegroom [phone]) on 02/03/2023 12:43:24 PM      Diagnoses:    1. Persistent atrial fibrillation (HCC)   2. Coronary artery disease involving native coronary artery of native heart without angina pectoris   3. Non-small cell cancer of right lung (HCC)   4. Multiple myeloma without remission (HCC)   5. Chronic anticoagulation   6. Chronic HFrEF (heart failure with reduced ejection fraction) (HCC)   7. Dyslipidemia     Assessment and Plan:     Persistent atrial fibrillation on chronic anticoagulation: HR is well controlled. Has been well controlled at home since hospital d/c. Referred to EP and seen by Dr. Nelly Laurence 07/05/22 with plan to pursue a fib ablation January 2024, however he was found to have concerning neoplasm and procedure was cancelled. We discussed potential return appointment with Dr. Nelly Laurence once he has completed current round of chemo. He will discuss with oncology. Remains asymptomatic with atrial fib. Remains on Coumadin for stroke prevention for CHA2DS2-VASc score of 7. No bleeding concerns. Continue metoprolol 50 mg twice daily for rate control.  CAD without angina: Total occlusion of mLAD within previously stented segment with well-developed apical collaterals, widely patent RCA and circumflex on [facility] 07/2015. He denies chest pain, dyspnea, or other symptoms concerning for angina.  No indication for further ischemic evaluation at this time. LDL cholesterol is well-controlled.   Hyperlipidemia LDL goal < 55: LDL 66 on 05/02/2022. Not directly addressed today. Continue atorvastatin.   Chronic HFrEF/ICM:  LVEF 35-40%, indeterminate diastolic parameters,  with regional wall motion abnormalities as outlined on echo 01/25/23. He denies shortness of breath, dyspnea, chest pain, orthopnea, PND. Mild bilateral  LE edema. Appears euvolemic on exam. We discussed  current findings in the setting of persistent a fib and cancer treatment. Weight is stable. Consideration given to adding additional GDMT, however there is concern about potential hypotension. Advised 2L fluid restriction and low sodium diet. He is walking more for exercise and I do not want him to become unsteady secondary to hypotension. Advised him to notify us if he develops concerning symptoms of CHF. Continue metoprolol.   Multiple myeloma and non-small cell lung cancer: Treated with XRT and chemo for multiple myeloma then XRT for lung CA. Currently undergoing immunotherapy for lung CA. Management per oncology.      Disposition: 3 months with Dr. Eden Emms or me  Medication Adjustments/Labs and Tests Ordered: Current medicines are reviewed at length with the patient today.  Concerns regarding medicines are outlined above.  Orders Placed This Encounter  Procedures   EKG 12-Lead   No orders of the defined types were placed in this encounter.   Patient Instructions  Medication Instructions:   Your physician recommends that you continue on your current medications as directed. Please refer to the Current Medication list given to you today.   *If you need a refill on your cardiac medications before your next appointment, please call your pharmacy*   Lab Work: NONE ORDERED  TODAY    If you have labs (blood work) drawn today and your tests are completely normal, you will receive your results only by: MyChart Message (if you have MyChart) OR A paper copy in the mail If you have any lab test that is abnormal or we need to change your treatment, we will call you to review the results.   Testing/Procedures: NONE ORDERED  TODAY     Follow-Up: At [facility], you and your health needs are our priority.  As part of our continuing mission to provide you with exceptional heart care, we have created designated Provider Care Teams.   These Care Teams include your primary Cardiologist (physician) and Advanced Practice Providers (APPs -  Physician Assistants and Nurse Practitioners) who all work together to provide you with the care you need, when you need it.  We recommend signing up for the patient portal called "MyChart".  Sign up information is provided on this After Visit Summary.  MyChart is used to connect with patients for Virtual Visits (Telemedicine).  Patients are able to view lab/test results, encounter notes, upcoming appointments, etc.  Non-urgent messages can be sent to your provider as well.   To learn more about what you can do with MyChart, go to [email or web address].    Your next appointment:   3 month(s)  Provider:  DR Marcene Corning     Other Instructions     Signed, Levi Aland, NP  02/03/2023 12:56 PM    [facility]

## 2023-02-03 ENCOUNTER — Ambulatory Visit: Payer: Medicare Other | Attending: Nurse Practitioner | Admitting: Nurse Practitioner

## 2023-02-03 ENCOUNTER — Encounter: Payer: Self-pay | Admitting: Nurse Practitioner

## 2023-02-03 VITALS — BP 114/70 | HR 67 | Ht 72.0 in | Wt 179.8 lb

## 2023-02-03 DIAGNOSIS — C9000 Multiple myeloma not having achieved remission: Secondary | ICD-10-CM

## 2023-02-03 DIAGNOSIS — I4819 Other persistent atrial fibrillation: Secondary | ICD-10-CM

## 2023-02-03 DIAGNOSIS — I2510 Atherosclerotic heart disease of native coronary artery without angina pectoris: Secondary | ICD-10-CM

## 2023-02-03 DIAGNOSIS — C3491 Malignant neoplasm of unspecified part of right bronchus or lung: Secondary | ICD-10-CM

## 2023-02-03 DIAGNOSIS — Z7901 Long term (current) use of anticoagulants: Secondary | ICD-10-CM

## 2023-02-03 DIAGNOSIS — E785 Hyperlipidemia, unspecified: Secondary | ICD-10-CM

## 2023-02-03 DIAGNOSIS — I5022 Chronic systolic (congestive) heart failure: Secondary | ICD-10-CM

## 2023-02-03 NOTE — Patient Instructions (Signed)
Medication Instructions:   Your physician recommends that you continue on your current medications as directed. Please refer to the Current Medication list given to you today.   *If you need a refill on your cardiac medications before your next appointment, please call your pharmacy*   Lab Work: NONE ORDERED  TODAY    If you have labs (blood work) drawn today and your tests are completely normal, you will receive your results only by: MyChart Message (if you have MyChart) OR A paper copy in the mail If you have any lab test that is abnormal or we need to change your treatment, we will call you to review the results.   Testing/Procedures: NONE ORDERED  TODAY     Follow-Up: At [facility], you and your health needs are our priority.  As part of our continuing mission to provide you with exceptional heart care, we have created designated Provider Care Teams.  These Care Teams include your primary Cardiologist (physician) and Advanced Practice Providers (APPs -  Physician Assistants and Nurse Practitioners) who all work together to provide you with the care you need, when you need it.  We recommend signing up for the patient portal called "MyChart".  Sign up information is provided on this After Visit Summary.  MyChart is used to connect with patients for Virtual Visits (Telemedicine).  Patients are able to view lab/test results, encounter notes, upcoming appointments, etc.  Non-urgent messages can be sent to your provider as well.   To learn more about what you can do with MyChart, go to [email or web address].    Your next appointment:   3 month(s)  Provider:  DR Marcene Corning     Other Instructions

## 2023-02-04 ENCOUNTER — Other Ambulatory Visit: Payer: Self-pay

## 2023-02-05 ENCOUNTER — Other Ambulatory Visit: Payer: Self-pay | Admitting: Cardiovascular Disease

## 2023-02-05 DIAGNOSIS — I4819 Other persistent atrial fibrillation: Secondary | ICD-10-CM

## 2023-02-06 ENCOUNTER — Inpatient Hospital Stay: Payer: Medicare Other | Attending: Internal Medicine

## 2023-02-06 ENCOUNTER — Inpatient Hospital Stay (HOSPITAL_BASED_OUTPATIENT_CLINIC_OR_DEPARTMENT_OTHER): Payer: Medicare Other | Admitting: Internal Medicine

## 2023-02-06 ENCOUNTER — Inpatient Hospital Stay: Payer: Medicare Other

## 2023-02-06 ENCOUNTER — Other Ambulatory Visit: Payer: Self-pay

## 2023-02-06 DIAGNOSIS — C9000 Multiple myeloma not having achieved remission: Secondary | ICD-10-CM | POA: Insufficient documentation

## 2023-02-06 DIAGNOSIS — Z79620 Long term (current) use of immunosuppressive biologic: Secondary | ICD-10-CM | POA: Insufficient documentation

## 2023-02-06 DIAGNOSIS — Z5112 Encounter for antineoplastic immunotherapy: Secondary | ICD-10-CM | POA: Diagnosis not present

## 2023-02-06 DIAGNOSIS — R911 Solitary pulmonary nodule: Secondary | ICD-10-CM | POA: Insufficient documentation

## 2023-02-06 DIAGNOSIS — G893 Neoplasm related pain (acute) (chronic): Secondary | ICD-10-CM | POA: Diagnosis not present

## 2023-02-06 LAB — CBC WITH DIFFERENTIAL (CANCER CENTER ONLY)
Abs Immature Granulocytes: 0.06 10*3/uL (ref 0.00–0.07)
Basophils Absolute: 0 10*3/uL (ref 0.0–0.1)
Basophils Relative: 0 %
Eosinophils Absolute: 0.1 10*3/uL (ref 0.0–0.5)
Eosinophils Relative: 1 %
HCT: 44 % (ref 39.0–52.0)
Hemoglobin: 15 g/dL (ref 13.0–17.0)
Immature Granulocytes: 1 %
Lymphocytes Relative: 4 %
Lymphs Abs: 0.4 10*3/uL — ABNORMAL LOW (ref 0.7–4.0)
MCH: 33.4 pg (ref 26.0–34.0)
MCHC: 34.1 g/dL (ref 30.0–36.0)
MCV: 98 fL (ref 80.0–100.0)
Monocytes Absolute: 0.3 10*3/uL (ref 0.1–1.0)
Monocytes Relative: 3 %
Neutro Abs: 8.7 10*3/uL — ABNORMAL HIGH (ref 1.7–7.7)
Neutrophils Relative %: 91 %
Platelet Count: 143 10*3/uL — ABNORMAL LOW (ref 150–400)
RBC: 4.49 MIL/uL (ref 4.22–5.81)
RDW: 15 % (ref 11.5–15.5)
WBC Count: 9.6 10*3/uL (ref 4.0–10.5)
nRBC: 0 % (ref 0.0–0.2)

## 2023-02-06 LAB — CMP (CANCER CENTER ONLY)
ALT: 43 U/L (ref 0–44)
AST: 22 U/L (ref 15–41)
Albumin: 3.4 g/dL — ABNORMAL LOW (ref 3.5–5.0)
Alkaline Phosphatase: 68 U/L (ref 38–126)
Anion gap: 7 (ref 5–15)
BUN: 13 mg/dL (ref 8–23)
CO2: 26 mmol/L (ref 22–32)
Calcium: 8.6 mg/dL — ABNORMAL LOW (ref 8.9–10.3)
Chloride: 105 mmol/L (ref 98–111)
Creatinine: 0.85 mg/dL (ref 0.61–1.24)
GFR, Estimated: >60 mL/min (ref >60)
Glucose, Bld: 111 mg/dL — ABNORMAL HIGH (ref 70–99)
Potassium: 3.9 mmol/L (ref 3.5–5.1)
Sodium: 138 mmol/L (ref 135–145)
Total Bilirubin: 1.2 mg/dL (ref 0.3–1.2)
Total Protein: 5.8 g/dL — ABNORMAL LOW (ref 6.5–8.1)

## 2023-02-06 MED ORDER — ACETAMINOPHEN 325 MG PO TABS
650.0000 mg | ORAL_TABLET | Freq: Once | ORAL | Status: AC
  Administered 2023-02-06: 650 mg via ORAL
  Filled 2023-02-06: qty 2

## 2023-02-06 MED ORDER — DIPHENHYDRAMINE HCL 25 MG PO CAPS
50.0000 mg | ORAL_CAPSULE | Freq: Once | ORAL | Status: AC
  Administered 2023-02-06: 50 mg via ORAL
  Filled 2023-02-06: qty 2

## 2023-02-06 MED ORDER — DARATUMUMAB-HYALURONIDASE-FIHJ 1800-30000 MG-UT/15ML ~~LOC~~ SOLN
1800.0000 mg | Freq: Once | SUBCUTANEOUS | Status: AC
  Administered 2023-02-06: 1800 mg via SUBCUTANEOUS
  Filled 2023-02-06: qty 15

## 2023-02-06 MED ORDER — BORTEZOMIB CHEMO SQ INJECTION 3.5 MG (2.5MG/ML)
1.3000 mg/m2 | Freq: Once | INTRAMUSCULAR | Status: AC
  Administered 2023-02-06: 2.75 mg via SUBCUTANEOUS
  Filled 2023-02-06: qty 1.1

## 2023-02-06 NOTE — Progress Notes (Signed)
[facility] [phone]   [phone]  OFFICE PROGRESS NOTE  Farris Has, MD 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 Kathrynn Running.  CURRENT THERAPY: Systemic chemotherapy with daratumumab subcutaneously weekly in addition to Velcade 1.3 Mg/M2 on days 1, 4, 8, 11 every 3 weeks as well as Revlimid 25 mg p.o. daily for 14 days every 3 weeks and Decadron 20 mg p.o. weekly with chemotherapy.  First cycle expected on 12/05/2022.  Status post 2 cycles.  Starting from cycle #2 I will reduce the dose of Revlimid to 15 mg p.o. daily for 14 days every 3 weeks.  INTERVAL HISTORY: Ronald Duran 82 y.o. male returns to the neck today for follow-up visit accompanied by his wife.  The patient is feeling much better today.  His atrial fibrillation is well-controlled.  He is followed by cardiology.  He denied having any current chest pain, shortness of breath, cough or hemoptysis.  He has no nausea, vomiting, diarrhea or constipation.  He has no headache or visual changes.  He is currently off treatment with Revlimid because of the pancytopenia. He is here today for evaluation before starting cycle #4.  MEDICAL HISTORY: Past Medical History:  Diagnosis Date   Atrial fibrillation (HCC)    CAD (coronary artery disease)    a. s/p MI with LAD stent placement December, 1999 -last stress test November 2008 with no EKG evidence of ischemia  b. cath 07/23/2015 after abnormal ETT and nuc, occluded mid LAD stent with good distal collateral, medical therapy    Cardiomyopathy (HCC)    Ischemic cardiomyopathy with EF 40% by echo 2005   COVID 2021   mild case   Dyslipidemia     Erectile dysfunction    GERD (gastroesophageal reflux disease)    Hiatal hernia    with esophageal strictures and dilatations in the past - Dr. [name]   History of nuclear stress test    Myoview 12/16: EF 47%, anterior, anteroseptal and inferoseptal defect suggesting infarct with peri-infarct ischemia, intermediate risk   HTN (hypertension)    Hx of seasonal allergies    Hyperlipidemia    Myocardial infarction (HCC) 08/01/1998   PAD (peripheral artery disease) (HCC)    Pre-diabetes    Prostate cancer (HCC)    with seed implant therapy following Lupron therapy in 2008 and 2009 - followed by Dr. Isabel Caprice   Ringworm    Stroke (HCC) 04/20/2017   seen on a MRI   Vitamin D deficiency     ALLERGIES:  has No Known Allergies.  MEDICATIONS:  Current Outpatient Medications  Medication Sig Dispense Refill   acyclovir (ZOVIRAX) 200 MG capsule Take 1 capsule (200 mg total) by mouth 2 (two) times daily. 60 capsule 5   atorvastatin (LIPITOR) 40 MG tablet Take 40 mg by mouth at bedtime.     B Complex Vitamins (B COMPLEX 100) tablet Take 1 tablet by mouth daily. Balance     cetirizine (ZYRTEC ALLERGY) 10 MG tablet Take 1 tablet (10 mg total) by mouth daily. 30 tablet 0   Cholecalciferol (VITAMIN D3) 2000 UNITS TABS Take 2,000 Units by mouth daily.     Coenzyme Q10 (  CO Q-10) 100 MG CAPS Take 100 mg by mouth daily with supper.     dexamethasone (DECADRON) 4 MG tablet 5 tab weekly start with the first dose of chemotherapy. (Patient taking differently: Take 20 mg by mouth See admin instructions. Take 5 tablets by mouth weekly start with the first dose of chemotherapy. Patient states he did take today because he thought maybe he would have Chemo today per patient) 40 tablet 3   lenalidomide (REVLIMID) 15 MG capsule Take 1 capsule (15 mg total) by mouth daily. Take for 14 days, then none for 7 days. Repeat every 21 days. Start Revlimid on day 1 of chemotherapy Auth #=[id number] date obtained 12/19/2022  Adult Male. (Patient not taking: Reported on 02/03/2023) 14 capsule 0   metoprolol tartrate (LOPRESSOR) 50 MG tablet Take 1 tablet (50 mg total) by mouth 2 (two) times daily. 60 tablet 2   Multiple Vitamins-Minerals (CENTRUM) tablet Take 1 tablet by mouth daily.     nitroGLYCERIN (NITROSTAT) 0.4 MG SL tablet PLACE 1 TABLET UNDER THE TONGUE IF NEEDED EVERY 5 MINUTES FOR CHEST PAIN FOR 3 DOSES IF NO RELIEF AFTER FIRST DOSE CALL 911. (Patient taking differently: Place 0.4 mg under the tongue every 5 (five) minutes as needed for chest pain.) 25 tablet 9   omeprazole (PRILOSEC) 20 MG capsule Take 20 mg by mouth daily before breakfast.     prochlorperazine (COMPAZINE) 10 MG tablet Take 1 tablet (10 mg total) by mouth every 6 (six) hours as needed for nausea or vomiting. 30 tablet 1   triamcinolone ointment (KENALOG) 0.5 % Apply 1 Application topically 2 (two) times daily. (Patient taking differently: Apply 1 Application topically daily as needed (For left foot rash).) 30 g 0   warfarin (COUMADIN) 5 MG tablet TAKE 1/2 TO 1 TABLET BY MOUTH AS DIRECTED BY COUMADIN CLINIC. 30 tablet 1   No current facility-administered medications for this visit.    SURGICAL HISTORY:  Past Surgical History:  Procedure Laterality Date   BRONCHIAL BIOPSY  11/15/2022   Procedure: BRONCHIAL BIOPSIES;  Surgeon: [name], DO;  Location: [facility] ENDOSCOPY;  Service: Pulmonary;;   BRONCHIAL BRUSHINGS  11/15/2022   Procedure: BRONCHIAL BRUSHINGS;  Surgeon: [name], DO;  Location: [facility];  Service: Pulmonary;;   BRONCHIAL NEEDLE ASPIRATION BIOPSY  11/15/2022   Procedure: BRONCHIAL NEEDLE ASPIRATION BIOPSIES;  Surgeon: [name], DO;  Location: [facility]OS[facility];  Service: Pulmonary;;   CARDIAC CATHETERIZATION N/A 07/23/2015   Procedure: Left Heart Cath and Coronary Angiography;  Surgeon: [name], MD;  Location: [facility] INVASIVE CV LAB;  Service: Cardiovascular;  Laterality: N/A;   CARDIOVERSION N/A 06/03/2022    Procedure: CARDIOVERSION;  Surgeon: [name], MD;  Location: [facility] ENDOSCOPY;  Service: Cardiovascular;  Laterality: N/A;   COLONOSCOPY WITH PROPOFOL N/A 11/24/2015   Procedure: COLONOSCOPY WITH PROPOFOL;  Surgeon: [name], MD;  Location: [facility] ENDOSCOPY;  Service: Endoscopy;  Laterality: N/A;   esophageal strictures with dilatations in the past per Dr. [name]     last '98   FIDUCIAL MARKER PLACEMENT  11/15/2022   Procedure: FIDUCIAL MARKER PLACEMENT;  Surgeon: [name], DO;  Location: [facility] ENDOSCOPY;  Service: Pulmonary;;   HERNIA REPAIR Left    INTRAMEDULLARY (IM) NAIL INTERTROCHANTERIC Left 10/25/2022   Procedure: LEFT INTRAMEDULLARY (IM) NAIL INTERTROCHANTERIC;  Surgeon: [name], MD;  Location: [facility] OR;  Service: Orthopedics;  Laterality: Left;   IR BONE MARROW BIOPSY & ASPIRATION  11/17/2022  IR BONE TUMOR(S)RF ABLATION  10/11/2022   IR KYPHO THORACIC WITH BONE BIOPSY  10/11/2022   LAD stent placement - Dr. [name]     left inguinal hernia repair - Dr. [name]     prostatic radioactive seed implantation - Dr. [name]     '09    REVIEW OF SYSTEMS:  A comprehensive review of systems was negative except for: Constitutional: positive for fatigue   PHYSICAL EXAMINATION: General appearance: alert, cooperative, fatigued, and no distress Head: Normocephalic, without obvious abnormality, atraumatic Neck: no adenopathy, no JVD, supple, symmetrical, trachea midline, and thyroid not enlarged, symmetric, no tenderness/mass/nodules Lymph nodes: Cervical, supraclavicular, and axillary nodes normal. Resp: clear to auscultation bilaterally Back: symmetric, no curvature. ROM normal. No CVA tenderness. Cardio: regular rate and rhythm, S1, S2 normal, no murmur, click, rub or gallop GI: soft, non-tender; bowel sounds normal; no masses,  no organomegaly Extremities: extremities normal, atraumatic, no cyanosis or edema  ECOG PERFORMANCE STATUS: 1 - Symptomatic but completely  ambulatory  Blood pressure (!) 124/92, pulse 72, temperature 98.1 F (36.7 C), temperature source Oral, resp. rate 17, height 6' (1.829 m), weight 180 lb 4.8 oz (81.8 kg), SpO2 100 %.  LABORATORY DATA: Lab Results  Component Value Date   WBC 9.6 02/06/2023   HGB 15.0 02/06/2023   HCT 44.0 02/06/2023   MCV 98.0 02/06/2023   PLT 143 (L) 02/06/2023      Chemistry      Component Value Date/Time   NA 138 02/06/2023 1056   NA 141 08/25/2022 1440   K 3.9 02/06/2023 1056   CL 105 02/06/2023 1056   CO2 26 02/06/2023 1056   BUN 13 02/06/2023 1056   BUN 14 08/25/2022 1440   CREATININE 0.85 02/06/2023 1056   CREATININE 1.23 (H) 07/21/2015 0917      Component Value Date/Time   CALCIUM 8.6 (L) 02/06/2023 1056   ALKPHOS 68 02/06/2023 1056   AST 22 02/06/2023 1056   ALT 43 02/06/2023 1056   BILITOT 1.2 02/06/2023 1056       RADIOGRAPHIC STUDIES: ECHOCARDIOGRAM COMPLETE  Result Date: 01/25/2023    ECHOCARDIOGRAM REPORT   Patient Name:   Ronald Duran Date of Exam: 01/25/2023 Medical Rec #:  [id number]        Height:       72.0 in Accession #:    [id number]       Weight:       184.7 lb Date of Birth:  June 21, 1941       BSA:          2.060 m Patient Age:    81 years         BP:           94/68 mmHg Patient Gender: M                HR:           92 bpm. Exam Location:  Inpatient Procedure: 2D Echo, Cardiac Doppler, Color Doppler and Intracardiac            Opacification Agent Indications:     I48.91* Unspeicified atrial fibrillation  History:         Patient has prior history of Echocardiogram examinations, most                  recent 10/21/2021. CHF and Cardiomyopathy, CAD and Previous                  Myocardial  Infarction, Abnormal ECG, Stroke, Arrythmias:Atrial                  Fibrillation and Bradycardia; Risk Factors:Hypertension and                  Dyslipidemia. Metastatic cancer.  Sonographer:     Sheralyn Boatman RDCS Referring Phys:  [id number] DAVID Kelby Fam ORTIZ Diagnosing Phys: Armanda Magic  MD  Sonographer Comments: Suboptimal parasternal window. IMPRESSIONS  1. Left ventricular ejection fraction, by estimation, is 35-40%. The left ventricle has normal function. The left ventricle has no regional wall motion abnormalities. The left ventricular internal cavity size was moderately dilated. Left ventricular diastolic function could not be evaluated. There is akinesis of the left ventricular, inferior wall, lateral wall and anterior wall. There is akinesis of the left ventricular, apical segment. 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  2. Right ventricular systolic function is normal. The right ventricular size is normal. Tricuspid regurgitation signal is inadequate for assessing PA pressure.  3. The mitral valve is normal in structure. Trivial mitral valve regurgitation. No evidence of mitral stenosis.  4. The aortic valve was not well visualized. Aortic valve regurgitation is not visualized. Aortic valve sclerosis/calcification is present, without any evidence of aortic stenosis.  5. The inferior vena cava is dilated in size with >50% respiratory variability, suggesting right atrial pressure of 8 mmHg. FINDINGS  Left Ventricle: Left ventricular ejection fraction, by estimation, is 35 to 40%. The left ventricle has moderately decreased function. The left ventricle demonstrates regional wall motion abnormalities. Definity contrast agent was given IV to delineate the left ventricular endocardial borders. The left ventricular internal cavity size was moderately dilated. There is no left ventricular hypertrophy. Left ventricular diastolic function could not be evaluated due to atrial fibrillation. Left ventricular diastolic function could not be evaluated. Right Ventricle: The right ventricular size is normal. No increase in right ventricular wall thickness. Right ventricular systolic function is normal. Tricuspid regurgitation signal is inadequate  for assessing PA pressure. Left Atrium: Left atrial size was normal in size. Right Atrium: Right atrial size was normal in size. Pericardium: There is no evidence of pericardial effusion. Mitral Valve: The mitral valve is normal in structure. Trivial mitral valve regurgitation. No evidence of mitral valve stenosis. Tricuspid Valve: The tricuspid valve is normal in structure. Tricuspid valve regurgitation is not demonstrated. No evidence of tricuspid stenosis. Aortic Valve: The aortic valve was not well visualized. Aortic valve regurgitation is not visualized. Aortic valve sclerosis/calcification is present, without any evidence of aortic stenosis. Pulmonic Valve: The pulmonic valve was normal in structure. Pulmonic valve regurgitation is not visualized. No evidence of pulmonic stenosis. Aorta: The aortic root is normal in size and structure. Venous: The inferior vena cava is dilated in size with greater than 50% respiratory variability, suggesting right atrial pressure of 8 mmHg. IAS/Shunts: No atrial level shunt detected by color flow Doppler.  LEFT VENTRICLE PLAX 2D LVIDd:         6.00 cm LVIDs:         5.20 cm LV PW:         0.90 cm LV IVS:        0.90 cm LVOT diam:     2.10 cm LV SV:         62 LV SV Index:   30 LVOT Area:     3.46 cm  LV Volumes (MOD) LV vol d, MOD A2C: 146.0 ml LV  vol d, MOD A4C: 114.5 ml LV vol s, MOD A2C: 100.0 ml LV vol s, MOD A4C: 99.6 ml LV SV MOD A2C:     46.0 ml LV SV MOD A4C:     114.5 ml LV SV MOD BP:      32.0 ml RIGHT VENTRICLE            IVC RV S prime:     6.74 cm/s  IVC diam: 2.60 cm TAPSE (M-mode): 0.9 cm LEFT ATRIUM             Index        RIGHT ATRIUM           Index LA diam:        4.70 cm 2.28 cm/m   RA Area:     14.50 cm LA Vol (A2C):   53.0 ml 25.73 ml/m  RA Volume:   31.40 ml  15.24 ml/m LA Vol (A4C):   32.4 ml 15.73 ml/m LA Biplane Vol: 41.1 ml 19.95 ml/m  AORTIC VALVE LVOT Vmax:   105.00 cm/s LVOT Vmean:  73.500 cm/s LVOT VTI:    0.180 m  AORTA Ao Root diam:  3.10 cm Ao Asc diam:  3.40 cm MITRAL VALVE MV Area (PHT): 4.39 cm     SHUNTS MV Decel Time: 173 msec     Systemic VTI:  0.18 m MV E velocity: 108.00 cm/s  Systemic Diam: 2.10 cm Armanda Magic MD Electronically signed by Armanda Magic MD Signature Date/Time: 01/25/2023/10:04:18 AM    Final (Updated)    DG Chest Portable 1 View  Result Date: 01/24/2023 CLINICAL DATA:  cough EXAM: PORTABLE CHEST - 1 VIEW COMPARISON:  01/14/2023 FINDINGS: Patchy interstitial and airspace opacities in both lung bases, new since previous. Blunting of the lateral costophrenic angles. Heart size upper limits normal. Aortic Atherosclerosis (ICD10-170.0). Visualized bones unremarkable. IMPRESSION: Patchy bibasilar infiltrates and small effusions. Electronically Signed   By: Corlis Leak M.D.   On: 01/24/2023 12:56   DG Chest 2 View  Result Date: 01/14/2023 CLINICAL DATA:  Productive cough EXAM: CHEST - 2 VIEW COMPARISON:  Prior chest x-ray 11/15/2022 FINDINGS: Interval development of small bilateral pleural effusions. No focal airspace infiltrate. Diffuse mild bronchitic changes are similar compared to prior. Prior T10 cement augmentation. No acute osseous abnormality. IMPRESSION: Small bilateral pleural effusions are new compared to prior chest x-ray from 11/15/2022. No focal airspace infiltrate to suggest bacterial pneumonia. Diffuse mild bronchitic changes are similar compared to prior. Electronically Signed   By: Malachy Moan M.D.   On: 01/14/2023 12:55   XR FEMUR MIN 2 VIEWS LEFT  Result Date: 01/12/2023 X-rays demonstrate stable alignment of the hardware without interval change   ASSESSMENT AND PLAN: This is a very pleasant 82 years old white male with multiple myeloma presented with multiple lytic lesions involving T10, left femoral neck as well as T4, T9 and T11 vertebral bodies. Also had right lower lobe lung nodule that was suspicious for non-small cell lung cancer. The biopsy performed by interventional radiology  from the T10 lesion was consistent with plasmacytoma which is part of the whole picture of multiple myeloma. The biopsy from the left hip was negative for malignancy. His myeloma panel showed no concerning findings except for elevated IgG but the serum light chains were normal. The patient underwent left hip fixation for impending fracture. He also underwent palliative radiotherapy under the care of Dr. Kathrynn Running. His recent bone marrow biopsy and aspirate was consistent with plasma cell  neoplasm with involvement of plasma cells 25% of the bone marrow. The patient is currently on treatment with daratumumab, Velcade, Revlimid and dexamethasone started on 12/05/2022 status post 3 cycles.  He has been off treatment with Revlimid starting from cycle #3 because of the significant pancytopenia The patient has been tolerating this treatment well. I recommended for him to proceed with day 1 of cycle #4 today as planned. I will arrange for the patient to have repeat myeloma panel before his next visit in 3 weeks. For the suspicious right lower lobe lung cancer, I the patient is expected to undergo SBRT under the care of Dr. Kathrynn Running. For anticoagulation, he is currently on Coumadin. He was advised to call immediately if he has any other concerning symptoms in the interval. The patient voices understanding of current disease status and treatment options and is in agreement with the current care plan.  All questions were answered. The patient knows to call the clinic with any problems, questions or concerns. We can certainly see the patient much sooner if necessary.  The total time spent in the appointment was 20 minutes.  Disclaimer: This note was dictated with voice recognition software. Similar sounding words can inadvertently be transcribed and may not be corrected upon review.

## 2023-02-06 NOTE — Patient Instructions (Signed)
[facility]  Discharge Instructions: Thank you for choosing [facility] to provide your oncology and hematology care.   If you have a lab appointment with the Cancer Center, please go directly to the Cancer Center and check in at the registration area.   Wear comfortable clothing and clothing appropriate for easy access to any Portacath or PICC line.   We strive to give you quality time with your provider. You may need to reschedule your appointment if you arrive late (15 or more minutes).  Arriving late affects you and other patients whose appointments are after yours.  Also, if you miss three or more appointments without notifying the office, you may be dismissed from the clinic at the provider's discretion.      For prescription refill requests, have your pharmacy contact our office and allow 72 hours for refills to be completed.    Today you received the following chemotherapy and/or immunotherapy agent: Darzalex Faspro & Velcade   To help prevent nausea and vomiting after your treatment, we encourage you to take your nausea medication as directed.  BELOW ARE SYMPTOMS THAT SHOULD BE REPORTED IMMEDIATELY: *FEVER GREATER THAN 100.4 F (38 C) OR HIGHER *CHILLS OR SWEATING *NAUSEA AND VOMITING THAT IS NOT CONTROLLED WITH YOUR NAUSEA MEDICATION *UNUSUAL SHORTNESS OF BREATH *UNUSUAL BRUISING OR BLEEDING *URINARY PROBLEMS (pain or burning when urinating, or frequent urination) *BOWEL PROBLEMS (unusual diarrhea, constipation, pain near the anus) TENDERNESS IN MOUTH AND THROAT WITH OR WITHOUT PRESENCE OF ULCERS (sore throat, sores in mouth, or a toothache) UNUSUAL RASH, SWELLING OR PAIN  UNUSUAL VAGINAL DISCHARGE OR ITCHING   Items with * indicate a potential emergency and should be followed up as soon as possible or go to the Emergency Department if any problems should occur.  Please show the CHEMOTHERAPY ALERT CARD or IMMUNOTHERAPY ALERT  CARD at check-in to the Emergency Department and triage nurse.  Should you have questions after your visit or need to cancel or reschedule your appointment, please contact [facility]  Dept: [phone]  and follow the prompts.  Office hours are 8:00 a.m. to 4:30 p.m. Monday - Friday. Please note that voicemails left after 4:00 p.m. may not be returned until the following business day.  We are closed weekends and major holidays. You have access to a nurse at all times for urgent questions. Please call the main number to the clinic Dept: [phone] and follow the prompts.   For any non-urgent questions, you may also contact your provider using MyChart. We now offer e-Visits for anyone 3 and older to request care online for non-urgent symptoms. For details visit [email or web address].   Also download the MyChart app! Go to the app store, search "MyChart", open the app, select [place], and log in with your MyChart username and password.  Daratumumab; Hyaluronidase Injection What is this medication? DARATUMUMAB; HYALURONIDASE (dar a toom ue mab; hye al ur ON i dase) treats multiple myeloma, a type of bone marrow cancer. Daratumumab works by blocking a protein that causes cancer cells to grow and multiply. This helps to slow or stop the spread of cancer cells. Hyaluronidase works by increasing the absorption of other medications in the body to help them work better. This medication may also be used treat amyloidosis, a condition that causes the buildup of a protein (amyloid) in your body. It works by reducing the buildup of this protein, which decreases symptoms. It is  a combination medication that contains a monoclonal antibody. This medicine may be used for other purposes; ask your health care provider or pharmacist if you have questions. COMMON BRAND NAME(S): DARZALEX FASPRO What should I tell my care team before I take this medication? They need to know if  you have any of these conditions: Heart disease Infection, such as chickenpox, cold sores, herpes, hepatitis B Lung or breathing disease An unusual or allergic reaction to daratumumab, hyaluronidase, other medications, foods, dyes, or preservatives Pregnant or trying to get pregnant Breast-feeding How should I use this medication? This medication is injected under the skin. It is given by your care team in a hospital or clinic setting. Talk to your care team about the use of this medication in children. Special care may be needed. Overdosage: If you think you have taken too much of this medicine contact a poison control center or emergency room at once. NOTE: This medicine is only for you. Do not share this medicine with others. What if I miss a dose? Keep appointments for follow-up doses. It is important not to miss your dose. Call your care team if you are unable to keep an appointment. What may interact with this medication? Interactions have not been studied. This list may not describe all possible interactions. Give your health care provider a list of all the medicines, herbs, non-prescription drugs, or dietary supplements you use. Also tell them if you smoke, drink alcohol, or use illegal drugs. Some items may interact with your medicine. What should I watch for while using this medication? Your condition will be monitored carefully while you are receiving this medication. This medication can cause serious allergic reactions. To reduce your risk, your care team may give you other medication to take before receiving this one. Be sure to follow the directions from your care team. This medication can affect the results of blood tests to match your blood type. These changes can last for up to 6 months after the final dose. Your care team will do blood tests to match your blood type before you start treatment. Tell all of your care team that you are being treated with this medication before  receiving a blood transfusion. This medication can affect the results of some tests used to determine treatment response; extra tests may be needed to evaluate response. Talk to your care team if you wish to become pregnant or think you are pregnant. This medication can cause serious birth defects if taken during pregnancy and for 3 months after the last dose. A reliable form of contraception is recommended while taking this medication and for 3 months after the last dose. Talk to your care team about effective forms of contraception. Do not breast-feed while taking this medication. What side effects may I notice from receiving this medication? Side effects that you should report to your care team as soon as possible: Allergic reactions--skin rash, itching, hives, swelling of the face, lips, tongue, or throat Heart rhythm changes--fast or irregular heartbeat, dizziness, feeling faint or lightheaded, chest pain, trouble breathing Infection--fever, chills, cough, sore throat, wounds that don't heal, pain or trouble when passing urine, general feeling of discomfort or being unwell Infusion reactions--chest pain, shortness of breath or trouble breathing, feeling faint or lightheaded Sudden eye pain or change in vision such as blurry vision, seeing halos around lights, vision loss Unusual bruising or bleeding Side effects that usually do not require medical attention (report to your care team if they continue or are bothersome):  Constipation Diarrhea Fatigue Nausea Pain, tingling, or numbness in the hands or feet Swelling of the ankles, hands, or feet This list may not describe all possible side effects. Call your doctor for medical advice about side effects. You may report side effects to FDA at 1-800-FDA-1088. Where should I keep my medication? This medication is given in a hospital or clinic. It will not be stored at home. NOTE: This sheet is a summary. It may not cover all possible information.  If you have questions about this medicine, talk to your doctor, pharmacist, or health care provider.  2024 Elsevier/Gold Standard (2021-11-30 00:00:00)  Bortezomib Injection What is this medication? BORTEZOMIB (bor TEZ oh mib) treats lymphoma. It may also be used to treat multiple myeloma, a type of bone marrow cancer. It works by blocking a protein that causes cancer cells to grow and multiply. This helps to slow or stop the spread of cancer cells. This medicine may be used for other purposes; ask your health care provider or pharmacist if you have questions. COMMON BRAND NAME(S): Velcade What should I tell my care team before I take this medication? They need to know if you have any of these conditions: Dehydration Diabetes Heart disease Liver disease Tingling of the fingers or toes or other nerve disorder An unusual or allergic reaction to bortezomib, other medications, foods, dyes, or preservatives If you or your partner are pregnant or trying to get pregnant Breastfeeding How should I use this medication? This medication is injected into a vein or under the skin. It is given by your care team in a hospital or clinic setting. Talk to your care team about the use of this medication in children. Special care may be needed. Overdosage: If you think you have taken too much of this medicine contact a poison control center or emergency room at once. NOTE: This medicine is only for you. Do not share this medicine with others. What if I miss a dose? Keep appointments for follow-up doses. It is important not to miss your dose. Call your care team if you are unable to keep an appointment. What may interact with this medication? Ketoconazole Rifampin This list may not describe all possible interactions. Give your health care provider a list of all the medicines, herbs, non-prescription drugs, or dietary supplements you use. Also tell them if you smoke, drink alcohol, or use illegal drugs. Some  items may interact with your medicine. What should I watch for while using this medication? Your condition will be monitored carefully while you are receiving this medication. You may need blood work while taking this medication. This medication may affect your coordination, reaction time, or judgment. Do not drive or operate machinery until you know how this medication affects you. Sit up or stand slowly to reduce the risk of dizzy or fainting spells. Drinking alcohol with this medication can increase the risk of these side effects. This medication may increase your risk of getting an infection. Call your care team for advice if you get a fever, chills, sore throat, or other symptoms of a cold or flu. Do not treat yourself. Try to avoid being around people who are sick. Check with your care team if you have severe diarrhea, nausea, and vomiting, or if you sweat a lot. The loss of too much body fluid may make it dangerous for you to take this medication. Talk to your care team if you may be pregnant. Serious birth defects can occur if you take this medication during  pregnancy and for 7 months after the last dose. You will need a negative pregnancy test before starting this medication. Contraception is recommended while taking this medication and for 7 months after the last dose. Your care team can help you find the option that works for you. If your partner can get pregnant, use a condom during sex while taking this medication and for 4 months after the last dose. Do not breastfeed while taking this medication and for 2 months after the last dose. This medication may cause infertility. Talk to your care team if you are concerned about your fertility. What side effects may I notice from receiving this medication? Side effects that you should report to your care team as soon as possible: Allergic reactions--skin rash, itching, hives, swelling of the face, lips, tongue, or throat Bleeding--bloody or black,  tar-like stools, vomiting blood or brown material that looks like coffee grounds, red or dark brown urine, small red or purple spots on skin, unusual bruising or bleeding Bleeding in the brain--severe headache, stiff neck, confusion, dizziness, change in vision, numbness or weakness of the face, arm, or leg, trouble speaking, trouble walking, vomiting Bowel blockage--stomach cramping, unable to have a bowel movement or pass gas, loss of appetite, vomiting Heart failure--shortness of breath, swelling of the ankles, feet, or hands, sudden weight gain, unusual weakness or fatigue Infection--fever, chills, cough, sore throat, wounds that don't heal, pain or trouble when passing urine, general feeling of discomfort or being unwell Liver injury--right upper belly pain, loss of appetite, nausea, light-colored stool, dark yellow or brown urine, yellowing skin or eyes, unusual weakness or fatigue Low blood pressure--dizziness, feeling faint or lightheaded, blurry vision Lung injury--shortness of breath or trouble breathing, cough, spitting up blood, chest pain, fever Pain, tingling, or numbness in the hands or feet Severe or prolonged diarrhea Stomach pain, bloody diarrhea, pale skin, unusual weakness or fatigue, decrease in the amount of urine, which may be signs of hemolytic uremic syndrome Sudden and severe headache, confusion, change in vision, seizures, which may be signs of posterior reversible encephalopathy syndrome (PRES) TTP--purple spots on the skin or inside the mouth, pale skin, yellowing skin or eyes, unusual weakness or fatigue, fever, fast or irregular heartbeat, confusion, change in vision, trouble speaking, trouble walking Tumor lysis syndrome (TLS)--nausea, vomiting, diarrhea, decrease in the amount of urine, dark urine, unusual weakness or fatigue, confusion, muscle pain or cramps, fast or irregular heartbeat, joint pain Side effects that usually do not require medical attention (report to  your care team if they continue or are bothersome): Constipation Diarrhea Fatigue Loss of appetite Nausea This list may not describe all possible side effects. Call your doctor for medical advice about side effects. You may report side effects to FDA at 1-800-FDA-1088. Where should I keep my medication? This medication is given in a hospital or clinic. It will not be stored at home. NOTE: This sheet is a summary. It may not cover all possible information. If you have questions about this medicine, talk to your doctor, pharmacist, or health care provider.  2024 Elsevier/Gold Standard (2021-12-28 00:00:00)

## 2023-02-06 NOTE — Progress Notes (Signed)
Pt reports taking Dex 20mg  at home prior to appointment time

## 2023-02-08 ENCOUNTER — Ambulatory Visit: Payer: Medicare Other | Attending: Cardiovascular Disease | Admitting: *Deleted

## 2023-02-08 DIAGNOSIS — Z79899 Other long term (current) drug therapy: Secondary | ICD-10-CM

## 2023-02-08 DIAGNOSIS — I4819 Other persistent atrial fibrillation: Secondary | ICD-10-CM | POA: Diagnosis not present

## 2023-02-08 DIAGNOSIS — I6349 Cerebral infarction due to embolism of other cerebral artery: Secondary | ICD-10-CM | POA: Diagnosis not present

## 2023-02-08 LAB — POCT INR: POC INR: 4.3

## 2023-02-08 NOTE — Patient Instructions (Addendum)
Description   Hold warfarin today and tomorrow then Continue taking warfarin 1 tablet daily EXCEPT 1/2 Mondays, Wednesdays, and Fridays.  Continue eating broccoli only every 2-3 days per week.  Recheck INR 2 weeks.  Anticoagulation [phone].

## 2023-02-10 ENCOUNTER — Inpatient Hospital Stay: Payer: Medicare Other

## 2023-02-10 ENCOUNTER — Other Ambulatory Visit: Payer: Self-pay

## 2023-02-10 ENCOUNTER — Ambulatory Visit: Payer: Medicare Other

## 2023-02-10 VITALS — BP 114/88 | HR 80 | Temp 97.9°F | Resp 17 | Wt 181.4 lb

## 2023-02-10 DIAGNOSIS — Z5112 Encounter for antineoplastic immunotherapy: Secondary | ICD-10-CM | POA: Diagnosis not present

## 2023-02-10 DIAGNOSIS — C9000 Multiple myeloma not having achieved remission: Secondary | ICD-10-CM

## 2023-02-10 MED ORDER — PROCHLORPERAZINE MALEATE 10 MG PO TABS
10.0000 mg | ORAL_TABLET | Freq: Once | ORAL | Status: AC
  Administered 2023-02-10: 10 mg via ORAL
  Filled 2023-02-10: qty 1

## 2023-02-10 MED ORDER — BORTEZOMIB CHEMO SQ INJECTION 3.5 MG (2.5MG/ML)
1.3000 mg/m2 | Freq: Once | INTRAMUSCULAR | Status: AC
  Administered 2023-02-10: 2.75 mg via SUBCUTANEOUS
  Filled 2023-02-10: qty 1.1

## 2023-02-13 ENCOUNTER — Inpatient Hospital Stay: Payer: Medicare Other

## 2023-02-13 ENCOUNTER — Ambulatory Visit: Payer: Medicare Other | Admitting: Internal Medicine

## 2023-02-13 ENCOUNTER — Other Ambulatory Visit: Payer: Medicare Other

## 2023-02-13 ENCOUNTER — Other Ambulatory Visit: Payer: Self-pay | Admitting: Physician Assistant

## 2023-02-13 ENCOUNTER — Ambulatory Visit: Payer: Medicare Other

## 2023-02-13 DIAGNOSIS — C9000 Multiple myeloma not having achieved remission: Secondary | ICD-10-CM

## 2023-02-13 DIAGNOSIS — G893 Neoplasm related pain (acute) (chronic): Secondary | ICD-10-CM

## 2023-02-13 DIAGNOSIS — Z5112 Encounter for antineoplastic immunotherapy: Secondary | ICD-10-CM | POA: Diagnosis not present

## 2023-02-13 LAB — CBC WITH DIFFERENTIAL (CANCER CENTER ONLY)
Abs Immature Granulocytes: 0.06 10*3/uL (ref 0.00–0.07)
Basophils Absolute: 0 10*3/uL (ref 0.0–0.1)
Basophils Relative: 0 %
Eosinophils Absolute: 0.1 10*3/uL (ref 0.0–0.5)
Eosinophils Relative: 1 %
HCT: 43.1 % (ref 39.0–52.0)
Hemoglobin: 14.7 g/dL (ref 13.0–17.0)
Immature Granulocytes: 1 %
Lymphocytes Relative: 6 %
Lymphs Abs: 0.5 10*3/uL — ABNORMAL LOW (ref 0.7–4.0)
MCH: 34.2 pg — ABNORMAL HIGH (ref 26.0–34.0)
MCHC: 34.1 g/dL (ref 30.0–36.0)
MCV: 100.2 fL — ABNORMAL HIGH (ref 80.0–100.0)
Monocytes Absolute: 0.5 10*3/uL (ref 0.1–1.0)
Monocytes Relative: 6 %
Neutro Abs: 7.2 10*3/uL (ref 1.7–7.7)
Neutrophils Relative %: 86 %
Platelet Count: 44 10*3/uL — ABNORMAL LOW (ref 150–400)
RBC: 4.3 MIL/uL (ref 4.22–5.81)
RDW: 15.8 % — ABNORMAL HIGH (ref 11.5–15.5)
WBC Count: 8.4 10*3/uL (ref 4.0–10.5)
nRBC: 0 % (ref 0.0–0.2)

## 2023-02-13 LAB — CMP (CANCER CENTER ONLY)
ALT: 33 U/L (ref 0–44)
AST: 27 U/L (ref 15–41)
Albumin: 3.4 g/dL — ABNORMAL LOW (ref 3.5–5.0)
Alkaline Phosphatase: 67 U/L (ref 38–126)
Anion gap: 6 (ref 5–15)
BUN: 15 mg/dL (ref 8–23)
CO2: 26 mmol/L (ref 22–32)
Calcium: 9.3 mg/dL (ref 8.9–10.3)
Chloride: 105 mmol/L (ref 98–111)
Creatinine: 0.96 mg/dL (ref 0.61–1.24)
GFR, Estimated: >60 mL/min (ref >60)
Glucose, Bld: 92 mg/dL (ref 70–99)
Potassium: 4 mmol/L (ref 3.5–5.1)
Sodium: 137 mmol/L (ref 135–145)
Total Bilirubin: 0.9 mg/dL (ref 0.3–1.2)
Total Protein: 5.7 g/dL — ABNORMAL LOW (ref 6.5–8.1)

## 2023-02-13 MED ORDER — OXYCODONE-ACETAMINOPHEN 5-325 MG PO TABS
1.0000 | ORAL_TABLET | Freq: Four times a day (QID) | ORAL | 0 refills | Status: DC | PRN
Start: 2023-02-13 — End: 2023-03-06

## 2023-02-13 NOTE — Progress Notes (Signed)
No treatment today or 7/11 due to Platelet count pf 44 per Dr. Arbutus Ped. Communicated to patient and her verbalized understanding that his next scheduled appt is on Monday.  Patient also stated that he has had lower back and bilateral hip pain which is worse at night. He stated that he has been taking Tylenol but at night it does not help with the pain and he is having difficulty sleeping. He stated that his pain is an 8 at night. Dr. Arbutus Ped stated that he or Cassie PA will send in a prescription for Percocet. Patient is aware of the plan.

## 2023-02-15 NOTE — Telephone Encounter (Signed)
Patient stated he spoke with Dr. Eden Emms while in hospital and wants to be his patient.

## 2023-02-16 ENCOUNTER — Ambulatory Visit: Payer: Medicare Other

## 2023-02-20 ENCOUNTER — Other Ambulatory Visit: Payer: Self-pay

## 2023-02-20 ENCOUNTER — Inpatient Hospital Stay (HOSPITAL_BASED_OUTPATIENT_CLINIC_OR_DEPARTMENT_OTHER): Payer: Medicare Other | Admitting: Internal Medicine

## 2023-02-20 ENCOUNTER — Inpatient Hospital Stay: Payer: Medicare Other

## 2023-02-20 DIAGNOSIS — C9000 Multiple myeloma not having achieved remission: Secondary | ICD-10-CM

## 2023-02-20 DIAGNOSIS — Z5112 Encounter for antineoplastic immunotherapy: Secondary | ICD-10-CM | POA: Diagnosis not present

## 2023-02-20 LAB — CBC WITH DIFFERENTIAL (CANCER CENTER ONLY)
Abs Immature Granulocytes: 0.03 10*3/uL (ref 0.00–0.07)
Basophils Absolute: 0 10*3/uL (ref 0.0–0.1)
Basophils Relative: 1 %
Eosinophils Absolute: 0.1 10*3/uL (ref 0.0–0.5)
Eosinophils Relative: 1 %
HCT: 42.8 % (ref 39.0–52.0)
Hemoglobin: 14.8 g/dL (ref 13.0–17.0)
Immature Granulocytes: 0 %
Lymphocytes Relative: 9 %
Lymphs Abs: 0.7 10*3/uL (ref 0.7–4.0)
MCH: 34.1 pg — ABNORMAL HIGH (ref 26.0–34.0)
MCHC: 34.6 g/dL (ref 30.0–36.0)
MCV: 98.6 fL (ref 80.0–100.0)
Monocytes Absolute: 0.5 10*3/uL (ref 0.1–1.0)
Monocytes Relative: 6 %
Neutro Abs: 6.7 10*3/uL (ref 1.7–7.7)
Neutrophils Relative %: 83 %
Platelet Count: 95 10*3/uL — ABNORMAL LOW (ref 150–400)
RBC: 4.34 MIL/uL (ref 4.22–5.81)
RDW: 16 % — ABNORMAL HIGH (ref 11.5–15.5)
WBC Count: 8 10*3/uL (ref 4.0–10.5)
nRBC: 0 % (ref 0.0–0.2)

## 2023-02-20 LAB — CMP (CANCER CENTER ONLY)
ALT: 27 U/L (ref 0–44)
AST: 22 U/L (ref 15–41)
Albumin: 3.6 g/dL (ref 3.5–5.0)
Alkaline Phosphatase: 60 U/L (ref 38–126)
Anion gap: 5 (ref 5–15)
BUN: 13 mg/dL (ref 8–23)
CO2: 27 mmol/L (ref 22–32)
Calcium: 9.2 mg/dL (ref 8.9–10.3)
Chloride: 106 mmol/L (ref 98–111)
Creatinine: 0.89 mg/dL (ref 0.61–1.24)
GFR, Estimated: >60 mL/min (ref >60)
Glucose, Bld: 96 mg/dL (ref 70–99)
Potassium: 3.9 mmol/L (ref 3.5–5.1)
Sodium: 138 mmol/L (ref 135–145)
Total Bilirubin: 1.6 mg/dL — ABNORMAL HIGH (ref 0.3–1.2)
Total Protein: 5.6 g/dL — ABNORMAL LOW (ref 6.5–8.1)

## 2023-02-20 MED ORDER — DIPHENHYDRAMINE HCL 25 MG PO CAPS
50.0000 mg | ORAL_CAPSULE | Freq: Once | ORAL | Status: AC
  Administered 2023-02-20: 50 mg via ORAL
  Filled 2023-02-20: qty 2

## 2023-02-20 MED ORDER — BORTEZOMIB CHEMO SQ INJECTION 3.5 MG (2.5MG/ML)
1.3000 mg/m2 | Freq: Once | INTRAMUSCULAR | Status: AC
  Administered 2023-02-20: 2.75 mg via SUBCUTANEOUS
  Filled 2023-02-20: qty 1.1

## 2023-02-20 MED ORDER — ACETAMINOPHEN 325 MG PO TABS
650.0000 mg | ORAL_TABLET | Freq: Once | ORAL | Status: AC
  Administered 2023-02-20: 650 mg via ORAL
  Filled 2023-02-20: qty 2

## 2023-02-20 MED ORDER — DARATUMUMAB-HYALURONIDASE-FIHJ 1800-30000 MG-UT/15ML ~~LOC~~ SOLN
1800.0000 mg | Freq: Once | SUBCUTANEOUS | Status: AC
  Administered 2023-02-20: 1800 mg via SUBCUTANEOUS
  Filled 2023-02-20: qty 15

## 2023-02-20 NOTE — Progress Notes (Signed)
[facility] Telephone:([phone]   Fax:([phone]  OFFICE PROGRESS NOTE  Farris Has, MD [address] [place] [address]  DIAGNOSIS:  1) Multiple myeloma, presented with plasmacytoma as well as other lytic lesions in the bone diagnosed in March 2024 2) right lower lobe pulmonary nodule suspicious for non-small cell lung cancer  PRIOR THERAPY: SBRT to the right lower lobe pulmonary nodule under the care of Dr. Kathrynn Running.  CURRENT THERAPY: Systemic chemotherapy with daratumumab subcutaneously weekly in addition to Velcade 1.3 Mg/M2 on days 1, 4, 8, 11 every 3 weeks as well as Revlimid 25 mg p.o. daily for 14 days every 3 weeks and Decadron 20 mg p.o. weekly with chemotherapy.  First cycle expected on 12/05/2022.  Status post 3 cycles.  Starting from cycle #2 I will reduce the dose of Revlimid to 15 mg p.o. daily for 14 days every 3 weeks.  INTERVAL HISTORY: Ronald Duran 82 y.o. male returns to the clinic today for follow-up visit.  The patient is feeling fine today with no concerning complaints except for mild neuropathy in the fingers and toes.  He also has some aching pain in the hip area bilaterally.  He denied having any current chest pain, shortness of breath, cough or hemoptysis.  He has no nausea, vomiting, diarrhea or constipation.  He denied having any headache or visual changes.  He has no recent weight loss or night sweats.  He is tolerating his treatment with daratumumab, Velcade and Decadron fairly well.  MEDICAL HISTORY: Past Medical History:  Diagnosis Date   Atrial fibrillation (HCC)    CAD (coronary artery disease)    a. s/p MI with LAD stent placement December, 1999 -last stress test November 2008 with no EKG evidence of ischemia  b. cath 07/23/2015 after abnormal ETT and nuc, occluded mid LAD stent with good distal collateral, medical therapy    Cardiomyopathy (HCC)    Ischemic cardiomyopathy with EF 40% by echo 2005    COVID 2021   mild case   Dyslipidemia    Erectile dysfunction    GERD (gastroesophageal reflux disease)    Hiatal hernia    with esophageal strictures and dilatations in the past - Dr. [name]   History of nuclear stress test    Myoview 12/16: EF 47%, anterior, anteroseptal and inferoseptal defect suggesting infarct with peri-infarct ischemia, intermediate risk   HTN (hypertension)    Hx of seasonal allergies    Hyperlipidemia    Myocardial infarction (HCC) 08/01/1998   PAD (peripheral artery disease) (HCC)    Pre-diabetes    Prostate cancer (HCC)    with seed implant therapy following Lupron therapy in 2008 and 2009 - followed by Dr. Isabel Caprice   Ringworm    Stroke (HCC) 04/20/2017   seen on a MRI   Vitamin D deficiency     ALLERGIES:  has No Known Allergies.  MEDICATIONS:  Current Outpatient Medications  Medication Sig Dispense Refill   acyclovir (ZOVIRAX) 200 MG capsule Take 1 capsule (200 mg total) by mouth 2 (two) times daily. 60 capsule 5   atorvastatin (LIPITOR) 40 MG tablet Take 40 mg by mouth at bedtime.     B Complex Vitamins (B COMPLEX 100) tablet Take 1 tablet by mouth daily. Balance     cetirizine (ZYRTEC ALLERGY) 10 MG tablet Take 1 tablet (10 mg total) by mouth daily. 30 tablet 0   Cholecalciferol (VITAMIN D3) 2000 UNITS TABS Take 2,000  Units by mouth daily.     Coenzyme Q10 (CO Q-10) 100 MG CAPS Take 100 mg by mouth daily with supper.     dexamethasone (DECADRON) 4 MG tablet 5 tab weekly start with the first dose of chemotherapy. (Patient taking differently: Take 20 mg by mouth See admin instructions. Take 5 tablets by mouth weekly start with the first dose of chemotherapy. Patient states he did take today because he thought maybe he would have Chemo today per patient) 40 tablet 3   lenalidomide (REVLIMID) 15 MG capsule Take 1 capsule (15 mg total) by mouth daily. Take for 14 days, then none for 7 days. Repeat every 21 days. Start Revlimid on day 1 of chemotherapy  Auth #=[id number] date obtained 12/19/2022 Adult Male. (Patient not taking: Reported on 02/03/2023) 14 capsule 0   metoprolol tartrate (LOPRESSOR) 50 MG tablet Take 1 tablet (50 mg total) by mouth 2 (two) times daily. 60 tablet 2   Multiple Vitamins-Minerals (CENTRUM) tablet Take 1 tablet by mouth daily.     nitroGLYCERIN (NITROSTAT) 0.4 MG SL tablet PLACE 1 TABLET UNDER THE TONGUE IF NEEDED EVERY 5 MINUTES FOR CHEST PAIN FOR 3 DOSES IF NO RELIEF AFTER FIRST DOSE CALL 911. (Patient taking differently: Place 0.4 mg under the tongue every 5 (five) minutes as needed for chest pain.) 25 tablet 9   omeprazole (PRILOSEC) 20 MG capsule Take 20 mg by mouth daily before breakfast.     oxyCODONE-acetaminophen (PERCOCET/ROXICET) 5-325 MG tablet Take 1 tablet by mouth every 6 (six) hours as needed for severe pain. 30 tablet 0   prochlorperazine (COMPAZINE) 10 MG tablet Take 1 tablet (10 mg total) by mouth every 6 (six) hours as needed for nausea or vomiting. 30 tablet 1   triamcinolone ointment (KENALOG) 0.5 % Apply 1 Application topically 2 (two) times daily. (Patient taking differently: Apply 1 Application topically daily as needed (For left foot rash).) 30 g 0   warfarin (COUMADIN) 5 MG tablet TAKE 1/2 TO 1 TABLET BY MOUTH AS DIRECTED BY COUMADIN CLINIC. 30 tablet 1   No current facility-administered medications for this visit.    SURGICAL HISTORY:  Past Surgical History:  Procedure Laterality Date   BRONCHIAL BIOPSY  11/15/2022   Procedure: BRONCHIAL BIOPSIES;  Surgeon: [name], DO;  Location: [facility] ENDOSCOPY;  Service: Pulmonary;;   BRONCHIAL BRUSHINGS  11/15/2022   Procedure: BRONCHIAL BRUSHINGS;  Surgeon: [name], DO;  Location: [facility];  Service: Pulmonary;;   BRONCHIAL NEEDLE ASPIRATION BIOPSY  11/15/2022   Procedure: BRONCHIAL NEEDLE ASPIRATION BIOPSIES;  Surgeon: [name], DO;  Location: [facility]OS[facility];  Service: Pulmonary;;   CARDIAC CATHETERIZATION N/A 07/23/2015   Procedure:  Left Heart Cath and Coronary Angiography;  Surgeon: [name], MD;  Location[facility] INVASIVE CV LAB;  Service: Cardiovascular;  Laterality: N/A;   CARDIOVERSION N/A 06/03/2022   Procedure: CARDIOVERSION;  Surgeon: [name], MD;  Location[facility] ENDOSCOPY;  Service: Cardiovascular;  Laterality: N/A;   COLONOSCOPY WITH PROPOFOL N/A 11/24/2015   Procedure: COLONOSCOPY WITH PROPOFOL;  Surgeon: [name], MD;  Location: [facility] ENDOSCOPY;  Service: Endoscopy;  Laterality: N/A;   esophageal strictures with dilatations in the past per Dr. [name]     last '98   FIDUCIAL MARKER PLACEMENT  11/15/2022   Procedure: FIDUCIAL MARKER PLACEMENT;  Surgeon: [name], DO;  Location: [facility] ENDOSCOPY;  Service: Pulmonary;;   HERNIA REPAIR Left    INTRAMEDULLARY (IM) NAIL INTERTROCHANTERIC Left 10/25/2022  Procedure: LEFT INTRAMEDULLARY (IM) NAIL INTERTROCHANTERIC;  Surgeon: [name], MD;  Location: [facility] OR;  Service: Orthopedics;  Laterality: Left;   IR BONE MARROW BIOPSY & ASPIRATION  11/17/2022   IR BONE TUMOR(S)RF ABLATION  10/11/2022   IR KYPHO THORACIC WITH BONE BIOPSY  10/11/2022   LAD stent placement - Dr. [name]     left inguinal hernia repair - Dr. [name]     prostatic radioactive seed implantation - Dr. [name]     '09    REVIEW OF SYSTEMS:  A comprehensive review of systems was negative except for: Constitutional: positive for fatigue Musculoskeletal: positive for arthralgias Neurological: positive for paresthesia   PHYSICAL EXAMINATION: General appearance: alert, cooperative, fatigued, and no distress Head: Normocephalic, without obvious abnormality, atraumatic Neck: no adenopathy, no JVD, supple, symmetrical, trachea midline, and thyroid not enlarged, symmetric, no tenderness/mass/nodules Lymph nodes: Cervical, supraclavicular, and axillary nodes normal. Resp: clear to auscultation bilaterally Back: symmetric, no curvature. ROM normal. No CVA tenderness. Cardio: regular  rate and rhythm, S1, S2 normal, no murmur, click, rub or gallop GI: soft, non-tender; bowel sounds normal; no masses,  no organomegaly Extremities: extremities normal, atraumatic, no cyanosis or edema  ECOG PERFORMANCE STATUS: 1 - Symptomatic but completely ambulatory  Blood pressure (!) 124/92, pulse 91, temperature 97.6 F (36.4 C), temperature source Oral, resp. rate 18, height 6' (1.829 m), weight 185 lb (83.9 kg), SpO2 99%.  LABORATORY DATA: Lab Results  Component Value Date   WBC 8.0 02/20/2023   HGB 14.8 02/20/2023   HCT 42.8 02/20/2023   MCV 98.6 02/20/2023   PLT 95 (L) 02/20/2023      Chemistry      Component Value Date/Time   NA 138 02/20/2023 0926   NA 141 08/25/2022 1440   K 3.9 02/20/2023 0926   CL 106 02/20/2023 0926   CO2 27 02/20/2023 0926   BUN 13 02/20/2023 0926   BUN 14 08/25/2022 1440   CREATININE 0.89 02/20/2023 0926   CREATININE 1.23 (H) 07/21/2015 0917      Component Value Date/Time   CALCIUM 9.2 02/20/2023 0926   ALKPHOS 60 02/20/2023 0926   AST 22 02/20/2023 0926   ALT 27 02/20/2023 0926   BILITOT 1.6 (H) 02/20/2023 0926       RADIOGRAPHIC STUDIES: ECHOCARDIOGRAM COMPLETE  Result Date: 01/25/2023    ECHOCARDIOGRAM REPORT   Patient Name:   Ronald Duran Date of Exam: 01/25/2023 Medical Rec #:  [id number]        Height:       72.0 in Accession #:    [id number]       Weight:       184.7 lb Date of Birth:  [birth year]       BSA:          2.060 m Patient Age:    81 years         BP:           94/68 mmHg Patient Gender: M                HR:           92 bpm. Exam Location:  Inpatient Procedure: 2D Echo, Cardiac Doppler, Color Doppler and Intracardiac            Opacification Agent Indications:     I48.91* Unspeicified atrial fibrillation  History:         Patient has prior history of Echocardiogram examinations, most  recent 10/21/2021. CHF and Cardiomyopathy, CAD and Previous                  Myocardial Infarction, Abnormal ECG, Stroke,  Arrythmias:Atrial                  Fibrillation and Bradycardia; Risk Factors:Hypertension and                  Dyslipidemia. Metastatic cancer.  Sonographer:     Sheralyn Boatman RDCS Referring Phys:  [id number] DAVID Kelby Fam ORTIZ Diagnosing Phys: Armanda Magic MD  Sonographer Comments: Suboptimal parasternal window. IMPRESSIONS  1. Left ventricular ejection fraction, by estimation, is 35-40%. The left ventricle has normal function. The left ventricle has no regional wall motion abnormalities. The left ventricular internal cavity size was moderately dilated. Left ventricular diastolic function could not be evaluated. There is akinesis of the left ventricular, inferior wall, lateral wall and anterior wall. There is akinesis of the left ventricular, apical segment. 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  2. Right ventricular systolic function is normal. The right ventricular size is normal. Tricuspid regurgitation signal is inadequate for assessing PA pressure.  3. The mitral valve is normal in structure. Trivial mitral valve regurgitation. No evidence of mitral stenosis.  4. The aortic valve was not well visualized. Aortic valve regurgitation is not visualized. Aortic valve sclerosis/calcification is present, without any evidence of aortic stenosis.  5. The inferior vena cava is dilated in size with >50% respiratory variability, suggesting right atrial pressure of 8 mmHg. FINDINGS  Left Ventricle: Left ventricular ejection fraction, by estimation, is 35 to 40%. The left ventricle has moderately decreased function. The left ventricle demonstrates regional wall motion abnormalities. Definity contrast agent was given IV to delineate the left ventricular endocardial borders. The left ventricular internal cavity size was moderately dilated. There is no left ventricular hypertrophy. Left ventricular diastolic function could not be evaluated due to atrial fibrillation. Left  ventricular diastolic function could not be evaluated. Right Ventricle: The right ventricular size is normal. No increase in right ventricular wall thickness. Right ventricular systolic function is normal. Tricuspid regurgitation signal is inadequate for assessing PA pressure. Left Atrium: Left atrial size was normal in size. Right Atrium: Right atrial size was normal in size. Pericardium: There is no evidence of pericardial effusion. Mitral Valve: The mitral valve is normal in structure. Trivial mitral valve regurgitation. No evidence of mitral valve stenosis. Tricuspid Valve: The tricuspid valve is normal in structure. Tricuspid valve regurgitation is not demonstrated. No evidence of tricuspid stenosis. Aortic Valve: The aortic valve was not well visualized. Aortic valve regurgitation is not visualized. Aortic valve sclerosis/calcification is present, without any evidence of aortic stenosis. Pulmonic Valve: The pulmonic valve was normal in structure. Pulmonic valve regurgitation is not visualized. No evidence of pulmonic stenosis. Aorta: The aortic root is normal in size and structure. Venous: The inferior vena cava is dilated in size with greater than 50% respiratory variability, suggesting right atrial pressure of 8 mmHg. IAS/Shunts: No atrial level shunt detected by color flow Doppler.  LEFT VENTRICLE PLAX 2D LVIDd:         6.00 cm LVIDs:         5.20 cm LV PW:         0.90 cm LV IVS:        0.90 cm LVOT diam:     2.10 cm LV SV:         62  LV SV Index:   30 LVOT Area:     3.46 cm  LV Volumes (MOD) LV vol d, MOD A2C: 146.0 ml LV vol d, MOD A4C: 114.5 ml LV vol s, MOD A2C: 100.0 ml LV vol s, MOD A4C: 99.6 ml LV SV MOD A2C:     46.0 ml LV SV MOD A4C:     114.5 ml LV SV MOD BP:      32.0 ml RIGHT VENTRICLE            IVC RV S prime:     6.74 cm/s  IVC diam: 2.60 cm TAPSE (M-mode): 0.9 cm LEFT ATRIUM             Index        RIGHT ATRIUM           Index LA diam:        4.70 cm 2.28 cm/m   RA Area:     14.50 cm LA  Vol (A2C):   53.0 ml 25.73 ml/m  RA Volume:   31.40 ml  15.24 ml/m LA Vol (A4C):   32.4 ml 15.73 ml/m LA Biplane Vol: 41.1 ml 19.95 ml/m  AORTIC VALVE LVOT Vmax:   105.00 cm/s LVOT Vmean:  73.500 cm/s LVOT VTI:    0.180 m  AORTA Ao Root diam: 3.10 cm Ao Asc diam:  3.40 cm MITRAL VALVE MV Area (PHT): 4.39 cm     SHUNTS MV Decel Time: 173 msec     Systemic VTI:  0.18 m MV E velocity: 108.00 cm/s  Systemic Diam: 2.10 cm Armanda Magic MD Electronically signed by Armanda Magic MD Signature Date/Time: 01/25/2023/10:04:18 AM    Final (Updated)    DG Chest Portable 1 View  Result Date: 01/24/2023 CLINICAL DATA:  cough EXAM: PORTABLE CHEST - 1 VIEW COMPARISON:  01/14/2023 FINDINGS: Patchy interstitial and airspace opacities in both lung bases, new since previous. Blunting of the lateral costophrenic angles. Heart size upper limits normal. Aortic Atherosclerosis (ICD10-170.0). Visualized bones unremarkable. IMPRESSION: Patchy bibasilar infiltrates and small effusions. Electronically Signed   By: Corlis Leak M.D.   On: 01/24/2023 12:56    ASSESSMENT AND PLAN: This is a very pleasant 82 years old white male with multiple myeloma presented with multiple lytic lesions involving T10, left femoral neck as well as T4, T9 and T11 vertebral bodies. Also had right lower lobe lung nodule that was suspicious for non-small cell lung cancer. 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 The biopsy from the left hip was negative for malignancy. His myeloma panel showed no concerning findings except for elevated IgG but the serum light chains were normal. The patient underwent left hip fixation for impending fracture. He also underwent palliative radiotherapy under the care of Dr. Kathrynn Running. His recent bone marrow biopsy and aspirate was consistent with plasma cell neoplasm with involvement of plasma cells 25% of the  bone marrow. The patient is currently on treatment with daratumumab, Velcade, Revlimid and dexamethasone started on 12/05/2022 status post 3 cycles.  He has been off treatment with Revlimid starting from cycle #3 because of the significant pancytopenia The patient has been tolerating this treatment well with no concerning adverse effect except for the chemotherapy-induced thrombocytopenia that sometimes requires delays in his treatment. I recommended for him to proceed with day 8 of cycle #4 as planned. I will see him back for follow-up visit with  the start of cycle #5. I will repeat myeloma panel before his next visit. For the suspicious right lower lobe lung cancer, I the patient is expected to undergo SBRT under the care of Dr. Kathrynn Running. For anticoagulation, he is currently on Coumadin. He was advised to call immediately if he has any concerning symptoms in the interval. The patient voices understanding of current disease status and treatment options and is in agreement with the current care plan.  All questions were answered. The patient knows to call the clinic with any problems, questions or concerns. We can certainly see the patient much sooner if necessary.  The total time spent in the appointment was 20 minutes.  Disclaimer: This note was dictated with voice recognition software. Similar sounding words can inadvertently be transcribed and may not be corrected upon review.

## 2023-02-20 NOTE — Patient Instructions (Signed)
[facility]  Discharge Instructions: Thank you for choosing [facility] to provide your oncology and hematology care.   If you have a lab appointment with the Cancer Center, please go directly to the Cancer Center and check in at the registration area.   Wear comfortable clothing and clothing appropriate for easy access to any Portacath or PICC line.   We strive to give you quality time with your provider. You may need to reschedule your appointment if you arrive late (15 or more minutes).  Arriving late affects you and other patients whose appointments are after yours.  Also, if you miss three or more appointments without notifying the office, you may be dismissed from the clinic at the provider's discretion.      For prescription refill requests, have your pharmacy contact our office and allow 72 hours for refills to be completed.    Today you received the following chemotherapy and/or immunotherapy agents: Velcade/Darzalex Faspro      To help prevent nausea and vomiting after your treatment, we encourage you to take your nausea medication as directed.  BELOW ARE SYMPTOMS THAT SHOULD BE REPORTED IMMEDIATELY: *FEVER GREATER THAN 100.4 F (38 C) OR HIGHER *CHILLS OR SWEATING *NAUSEA AND VOMITING THAT IS NOT CONTROLLED WITH YOUR NAUSEA MEDICATION *UNUSUAL SHORTNESS OF BREATH *UNUSUAL BRUISING OR BLEEDING *URINARY PROBLEMS (pain or burning when urinating, or frequent urination) *BOWEL PROBLEMS (unusual diarrhea, constipation, pain near the anus) TENDERNESS IN MOUTH AND THROAT WITH OR WITHOUT PRESENCE OF ULCERS (sore throat, sores in mouth, or a toothache) UNUSUAL RASH, SWELLING OR PAIN  UNUSUAL VAGINAL DISCHARGE OR ITCHING   Items with * indicate a potential emergency and should be followed up as soon as possible or go to the Emergency Department if any problems should occur.  Please show the CHEMOTHERAPY ALERT CARD or IMMUNOTHERAPY ALERT  CARD at check-in to the Emergency Department and triage nurse.  Should you have questions after your visit or need to cancel or reschedule your appointment, please contact [facility]  Dept: [phone]  and follow the prompts.  Office hours are 8:00 a.m. to 4:30 p.m. Monday - Friday. Please note that voicemails left after 4:00 p.m. may not be returned until the following business day.  We are closed weekends and major holidays. You have access to a nurse at all times for urgent questions. Please call the main number to the clinic Dept: [phone] and follow the prompts.   For any non-urgent questions, you may also contact your provider using MyChart. We now offer e-Visits for anyone 18 and older to request care online for non-urgent symptoms. For details visit [email or web address].   Also download the MyChart app! Go to the app store, search "MyChart", open the app, select [facility], and log in with your MyChart username and password.   

## 2023-02-20 NOTE — Progress Notes (Signed)
Patient states he took dexamethasone 20 mg at home this a.m.  Per Dr Arbutus Ped ok to treat with platelets 95 & bilirubin 1.6 today

## 2023-02-21 ENCOUNTER — Ambulatory Visit: Payer: Medicare Other | Attending: Cardiovascular Disease

## 2023-02-21 DIAGNOSIS — Z79899 Other long term (current) drug therapy: Secondary | ICD-10-CM | POA: Diagnosis not present

## 2023-02-21 DIAGNOSIS — I6349 Cerebral infarction due to embolism of other cerebral artery: Secondary | ICD-10-CM

## 2023-02-21 DIAGNOSIS — Z7901 Long term (current) use of anticoagulants: Secondary | ICD-10-CM | POA: Diagnosis not present

## 2023-02-21 DIAGNOSIS — I4819 Other persistent atrial fibrillation: Secondary | ICD-10-CM

## 2023-02-21 LAB — POCT INR: INR: 3.7 — AB (ref 2.0–3.0)

## 2023-02-21 NOTE — Patient Instructions (Signed)
Hold warfarin today and then Continue taking warfarin 1 tablet daily EXCEPT 1/2 Mondays, Wednesdays, and Fridays.  Continue eating broccoli only every 2-3 days per week.  Recheck INR 3 weeks.  Anticoagulation Clinic-[phone].

## 2023-02-27 ENCOUNTER — Inpatient Hospital Stay: Payer: Medicare Other

## 2023-02-27 ENCOUNTER — Other Ambulatory Visit: Payer: Self-pay

## 2023-02-27 ENCOUNTER — Other Ambulatory Visit: Payer: Self-pay | Admitting: Physician Assistant

## 2023-02-27 ENCOUNTER — Ambulatory Visit: Payer: Medicare Other | Admitting: Physician Assistant

## 2023-02-27 DIAGNOSIS — C9000 Multiple myeloma not having achieved remission: Secondary | ICD-10-CM

## 2023-02-27 DIAGNOSIS — Z5112 Encounter for antineoplastic immunotherapy: Secondary | ICD-10-CM | POA: Diagnosis not present

## 2023-02-27 LAB — CBC WITH DIFFERENTIAL (CANCER CENTER ONLY)
Abs Immature Granulocytes: 0.04 10*3/uL (ref 0.00–0.07)
Basophils Absolute: 0 10*3/uL (ref 0.0–0.1)
Basophils Relative: 0 %
Eosinophils Absolute: 0.1 10*3/uL (ref 0.0–0.5)
Eosinophils Relative: 1 %
HCT: 40.5 % (ref 39.0–52.0)
Hemoglobin: 14 g/dL (ref 13.0–17.0)
Immature Granulocytes: 1 %
Lymphocytes Relative: 7 %
Lymphs Abs: 0.6 10*3/uL — ABNORMAL LOW (ref 0.7–4.0)
MCH: 34.2 pg — ABNORMAL HIGH (ref 26.0–34.0)
MCHC: 34.6 g/dL (ref 30.0–36.0)
MCV: 99 fL (ref 80.0–100.0)
Monocytes Absolute: 0.5 10*3/uL (ref 0.1–1.0)
Monocytes Relative: 6 %
Neutro Abs: 7.3 10*3/uL (ref 1.7–7.7)
Neutrophils Relative %: 85 %
Platelet Count: 56 10*3/uL — ABNORMAL LOW (ref 150–400)
RBC: 4.09 MIL/uL — ABNORMAL LOW (ref 4.22–5.81)
RDW: 16.1 % — ABNORMAL HIGH (ref 11.5–15.5)
WBC Count: 8.5 10*3/uL (ref 4.0–10.5)
nRBC: 0 % (ref 0.0–0.2)

## 2023-02-27 LAB — CMP (CANCER CENTER ONLY)
ALT: 20 U/L (ref 0–44)
AST: 19 U/L (ref 15–41)
Albumin: 3.5 g/dL (ref 3.5–5.0)
Alkaline Phosphatase: 57 U/L (ref 38–126)
Anion gap: 6 (ref 5–15)
BUN: 15 mg/dL (ref 8–23)
CO2: 26 mmol/L (ref 22–32)
Calcium: 9.1 mg/dL (ref 8.9–10.3)
Chloride: 106 mmol/L (ref 98–111)
Creatinine: 0.86 mg/dL (ref 0.61–1.24)
GFR, Estimated: >60 mL/min (ref >60)
Glucose, Bld: 101 mg/dL — ABNORMAL HIGH (ref 70–99)
Potassium: 4 mmol/L (ref 3.5–5.1)
Sodium: 138 mmol/L (ref 135–145)
Total Bilirubin: 1.3 mg/dL — ABNORMAL HIGH (ref 0.3–1.2)
Total Protein: 5.1 g/dL — ABNORMAL LOW (ref 6.5–8.1)

## 2023-02-27 NOTE — Progress Notes (Signed)
Pt here for chemo.  Platelets  56.    No  Chemo today  and  No Chemo  Thursday  7/25  as per Cassie H., PA.   Explanations given to pt and wife.  Both voiced understanding. Pt was stable at discharge via self ambulation with steady gait and wife.

## 2023-02-27 NOTE — Progress Notes (Signed)
No treatment this week per PA Cassie do due to treatment parameters not being met.  Demetrius Charity, PharmD

## 2023-02-28 LAB — KAPPA/LAMBDA LIGHT CHAINS
Kappa free light chain: 3.1 mg/L — ABNORMAL LOW (ref 3.3–19.4)
Kappa, lambda light chain ratio: 1.72 — ABNORMAL HIGH (ref 0.26–1.65)
Lambda free light chains: 1.8 mg/L — ABNORMAL LOW (ref 5.7–26.3)

## 2023-02-28 LAB — BETA 2 MICROGLOBULIN, SERUM: Beta-2 Microglobulin: 1.4 mg/L (ref 0.6–2.4)

## 2023-03-01 ENCOUNTER — Encounter: Payer: Self-pay | Admitting: Internal Medicine

## 2023-03-01 LAB — IGG, IGA, IGM
IgA: 8 mg/dL — ABNORMAL LOW (ref 61–437)
IgG (Immunoglobin G), Serum: 376 mg/dL — ABNORMAL LOW (ref 603–1613)
IgM (Immunoglobulin M), Srm: 13 mg/dL — ABNORMAL LOW (ref 15–143)

## 2023-03-02 ENCOUNTER — Ambulatory Visit: Payer: Medicare Other

## 2023-03-02 ENCOUNTER — Other Ambulatory Visit: Payer: Self-pay | Admitting: Physician Assistant

## 2023-03-02 ENCOUNTER — Encounter: Payer: Self-pay | Admitting: Internal Medicine

## 2023-03-03 ENCOUNTER — Encounter: Payer: Self-pay | Admitting: Internal Medicine

## 2023-03-03 ENCOUNTER — Other Ambulatory Visit: Payer: Self-pay | Admitting: Physician Assistant

## 2023-03-03 ENCOUNTER — Telehealth: Payer: Self-pay

## 2023-03-03 DIAGNOSIS — C9000 Multiple myeloma not having achieved remission: Secondary | ICD-10-CM

## 2023-03-03 NOTE — Telephone Encounter (Signed)
This nurse reached out to this patient and made him aware that he has lab, office visit and Infusion for Monday 7/29.  Patient is in agreement with appointment times.  No further questions or concerns noted at this time.

## 2023-03-04 NOTE — Progress Notes (Unsigned)
[facility] OFFICE PROGRESS NOTE  Farris Has, MD 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 Kathrynn Running.   CURRENT THERAPY: Systemic chemotherapy with daratumumab subcutaneously weekly in addition to Velcade 1.3 Mg/M2 on days 1, 4, 8, 11 every 3 weeks as well as Revlimid 25 mg p.o. daily for 14 days every 3 weeks and Decadron 20 mg p.o. weekly with chemotherapy.  First cycle expected on 12/05/2022.  Status post 4 cycles.  Starting from cycle #2 we have reduced the dose of Revlimid to 15 mg p.o. daily for 14 days every 3 weeks.   INTERVAL HISTORY: Ronald Duran 82 y.o. male returns to clinic today for follow-up visit accompanied by ***.  Patient is currently being treated for multiple myeloma.  He has been struggling with thrombocytopenia and periodically has his treatment held.  Last week, his Velcade and Darzalex was held.  He is taking ***15 confirm***milligrams of Revlimid.  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  Petechial rash?  Other rashes?  Triamcinolone cream from radiation?  Rash and erythema on the sole of the foot  Previously was struggling with hypotension and HCTZ was discontinued.  Otherwise he denies any fever, chills, night sweats, or unexplained weight loss.  Denies any chest pain, shortness of breath, cough, or hemoptysis.  Denies any nausea, vomiting, diarrhea, or constipation.  Denies any headache or visual changes.  Denies any signs and symptoms of infection such as sore throat, infections  abdominal pain, or dysuria.  He recently had a repeat myeloma panel performed.  He is here today for evaluation and repeat blood work before considering starting day 1 cycle 5.    MEDICAL HISTORY: Past Medical History:  Diagnosis Date   Atrial fibrillation (HCC)    CAD (coronary artery disease)    a. s/p MI with LAD stent placement December, 1999 -last stress test November 2008 with no EKG evidence of ischemia  b. cath 07/23/2015 after abnormal ETT and nuc, occluded mid LAD stent with good distal collateral, medical therapy    Cardiomyopathy (HCC)    Ischemic cardiomyopathy with EF 40% by echo 2005   COVID 2021   mild case   Dyslipidemia    Erectile dysfunction    GERD (gastroesophageal reflux disease)    Hiatal hernia    with esophageal strictures and dilatations in the past - Dr. [name]   History of nuclear stress test    Myoview 12/16: EF 47%, anterior, anteroseptal and inferoseptal defect suggesting infarct with peri-infarct ischemia, intermediate risk   HTN (hypertension)    Hx of seasonal allergies    Hyperlipidemia    Myocardial infarction (HCC) 08/01/1998   PAD (peripheral artery disease) (HCC)    Pre-diabetes    Prostate cancer (HCC)    with seed implant therapy following Lupron therapy in 2008 and 2009 - followed by Dr. Isabel Caprice   Ringworm    Stroke (HCC) 04/20/2017   seen on a MRI   Vitamin D deficiency     ALLERGIES:  has No Known Allergies.  MEDICATIONS:  Current  Outpatient Medications  Medication Sig Dispense Refill   acyclovir (ZOVIRAX) 200 MG capsule Take 1 capsule (200 mg total) by mouth 2 (two) times daily. 60 capsule 5   atorvastatin (LIPITOR) 40 MG tablet Take 40 mg by mouth at bedtime.     B Complex Vitamins (B COMPLEX 100) tablet Take 1 tablet by mouth daily. Balance     cetirizine (ZYRTEC ALLERGY) 10 MG tablet Take 1 tablet (10 mg total) by mouth daily. 30 tablet 0   Cholecalciferol (VITAMIN D3) 2000 UNITS TABS Take 2,000 Units by mouth daily.      Coenzyme Q10 (CO Q-10) 100 MG CAPS Take 100 mg by mouth daily with supper.     dexamethasone (DECADRON) 4 MG tablet 5 tab weekly start with the first dose of chemotherapy. (Patient taking differently: Take 20 mg by mouth See admin instructions. Take 5 tablets by mouth weekly start with the first dose of chemotherapy. Patient states he did take today because he thought maybe he would have Chemo today per patient) 40 tablet 3   lenalidomide (REVLIMID) 15 MG capsule Take 1 capsule (15 mg total) by mouth daily. Take for 14 days, then none for 7 days. Repeat every 21 days. Start Revlimid on day 1 of chemotherapy Auth #[id number] date obtained 12/19/2022 Adult Male. (Patient not taking: Reported on 02/03/2023) 14 capsule 0   metoprolol tartrate (LOPRESSOR) 50 MG tablet Take 1 tablet (50 mg total) by mouth 2 (two) times daily. 60 tablet 2   Multiple Vitamins-Minerals (CENTRUM) tablet Take 1 tablet by mouth daily.     nitroGLYCERIN (NITROSTAT) 0.4 MG SL tablet PLACE 1 TABLET UNDER THE TONGUE IF NEEDED EVERY 5 MINUTES FOR CHEST PAIN FOR 3 DOSES IF NO RELIEF AFTER FIRST DOSE CALL 911. (Patient taking differently: Place 0.4 mg under the tongue every 5 (five) minutes as needed for chest pain.) 25 tablet 9   omeprazole (PRILOSEC) 20 MG capsule Take 20 mg by mouth daily before breakfast.     oxyCODONE-acetaminophen (PERCOCET/ROXICET) 5-325 MG tablet Take 1 tablet by mouth every 6 (six) hours as needed for severe pain. 30 tablet 0   prochlorperazine (COMPAZINE) 10 MG tablet Take 1 tablet (10 mg total) by mouth every 6 (six) hours as needed for nausea or vomiting. 30 tablet 1   triamcinolone ointment (KENALOG) 0.5 % Apply 1 Application topically 2 (two) times daily. (Patient taking differently: Apply 1 Application topically daily as needed (For left foot rash).) 30 g 0   warfarin (COUMADIN) 5 MG tablet TAKE 1/2 TO 1 TABLET BY MOUTH AS DIRECTED BY COUMADIN CLINIC. 30 tablet 1   No current facility-administered  medications for this visit.    SURGICAL HISTORY:  Past Surgical History:  Procedure Laterality Date   BRONCHIAL BIOPSY  11/15/2022   Procedure: BRONCHIAL BIOPSIES;  Surgeon: [name], DO;  Location: [facility];  Service: Pulmonary;;   BRONCHIAL BRUSHINGS  11/15/2022   Procedure: BRONCHIAL BRUSHINGS;  Surgeon: [name], DO;  Location: [facility];  Service: Pulmonary;;   BRONCHIAL NEEDLE ASPIRATION BIOPSY  11/15/2022   Procedure: BRONCHIAL NEEDLE ASPIRATION BIOPSIES;  Surgeon: [name], DO;  Location: [facility];  Service: Pulmonary;;   CARDIAC CATHETERIZATION N/A 07/23/2015   Procedure: Left Heart Cath and Coronary Angiography;  Surgeon: [name], MD;  Location: [facility];  Service: Cardiovascular;  Laterality: N/A;   CARDIOVERSION N/A 06/03/2022   Procedure: CARDIOVERSION;  Surgeon: [name], MD;  Location: [facility] ENDOSCOPY;  Service: Cardiovascular;  Laterality: N/A;   COLONOSCOPY WITH PROPOFOL N/A 11/24/2015   Procedure: COLONOSCOPY WITH PROPOFOL;  Surgeon: [name], MD;  Location: [facility];  Service: Endoscopy;  Laterality: N/A;   esophageal strictures with dilatations in the past per Dr. [name]     last '98   FIDUCIAL MARKER PLACEMENT  11/15/2022   Procedure: FIDUCIAL MARKER PLACEMENT;  Surgeon: [name], DO;  Location: [facility];  Service: Pulmonary;;   HERNIA REPAIR Left    INTRAMEDULLARY (IM) NAIL INTERTROCHANTERIC Left 10/25/2022   Procedure: LEFT INTRAMEDULLARY (IM) NAIL INTERTROCHANTERIC;  Surgeon: [name], MD;  Location: [facility] OR;  Service: Orthopedics;  Laterality: Left;   IR BONE MARROW BIOPSY & ASPIRATION  11/17/2022   IR BONE TUMOR(S)RF ABLATION  10/11/2022   IR KYPHO THORACIC WITH BONE BIOPSY  10/11/2022   LAD stent placement - Dr. [name]     left inguinal hernia repair - Dr. [name]     prostatic radioactive seed implantation - Dr. [name]     '09    REVIEW OF SYSTEMS:   Review of Systems   Constitutional: Negative for appetite change, chills, fatigue, fever and unexpected weight change.  HENT:   Negative for mouth sores, nosebleeds, sore throat and trouble swallowing.   Eyes: Negative for eye problems and icterus.  Respiratory: Negative for cough, hemoptysis, shortness of breath and wheezing.   Cardiovascular: Negative for chest pain and leg swelling.  Gastrointestinal: Negative for abdominal pain, constipation, diarrhea, nausea and vomiting.  Genitourinary: Negative for bladder incontinence, difficulty urinating, dysuria, frequency and hematuria.   Musculoskeletal: Negative for back pain, gait problem, neck pain and neck stiffness.  Skin: Negative for itching and rash.  Neurological: Negative for dizziness, extremity weakness, gait problem, headaches, light-headedness and seizures.  Hematological: Negative for adenopathy. Does not bruise/bleed easily.  Psychiatric/Behavioral: Negative for confusion, depression and sleep disturbance. The patient is not nervous/anxious.     PHYSICAL EXAMINATION:  There were no vitals taken for this visit.  ECOG PERFORMANCE STATUS: {CHL ONC ECOG [id number]  Physical Exam  Constitutional: Oriented to person, place, and time and well-developed, well-nourished, and in no distress. No distress.  HENT:  Head: Normocephalic and atraumatic.  Mouth/Throat: Oropharynx is clear and moist. No oropharyngeal exudate.  Eyes: Conjunctivae are normal. Right eye exhibits no discharge. Left eye exhibits no discharge. No scleral icterus.  Neck: Normal range of motion. Neck supple.  Cardiovascular: Normal rate, regular rhythm, normal heart sounds and intact distal pulses.   Pulmonary/Chest: Effort normal and breath sounds normal. No respiratory distress. No wheezes. No rales.  Abdominal: Soft. Bowel sounds are normal. Exhibits no distension and no mass. There is no tenderness.  Musculoskeletal: Normal range of motion. Exhibits no edema.  Lymphadenopathy:     No cervical adenopathy.  Neurological: Alert and oriented to person, place, and time. Exhibits normal muscle tone. Gait normal. Coordination normal.  Skin: Skin is warm and dry. No rash noted. Not diaphoretic. No erythema. No pallor.  Psychiatric: Mood, memory and judgment normal.  Vitals reviewed.  LABORATORY DATA: Lab Results  Component Value Date   WBC 8.5 02/27/2023   HGB 14.0 02/27/2023   HCT 40.5 02/27/2023   MCV 99.0 02/27/2023   PLT 56 (L) 02/27/2023      Chemistry      Component Value Date/Time   NA 138 02/27/2023 0915   NA 141 08/25/2022 1440   K 4.0 02/27/2023 0915   CL 106 02/27/2023 0915   CO2 26 02/27/2023  0915   BUN 15 02/27/2023 0915   BUN 14 08/25/2022 1440   CREATININE 0.86 02/27/2023 0915   CREATININE 1.23 (H) 07/21/2015 0917      Component Value Date/Time   CALCIUM 9.1 02/27/2023 0915   ALKPHOS 57 02/27/2023 0915   AST 19 02/27/2023 0915   ALT 20 02/27/2023 0915   BILITOT 1.3 (H) 02/27/2023 0915       RADIOGRAPHIC STUDIES:  No results found.   ASSESSMENT/PLAN:  This is a very pleasant 82 year old Caucasian male with multiple myeloma presented with multiple lytic lesions involving T10, left femoral neck as well as T4, T9 and T11 vertebral bodies.    He also had right lower lobe lung nodule that was suspicious for non-small cell lung cancer.    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   The biopsy from the left hip was negative for malignancy. His myeloma panel showed no concerning findings except for elevated IgG but the serum light chains were normal. The patient underwent left hip fixation for impending fracture. He also underwent palliative radiotherapy under the care of Dr. Kathrynn Running. His recent bone marrow biopsy and aspirate was consistent with plasma cell neoplasm with involvement of plasma cells 25% of the bone  marrow.  The patient is currently on treatment with daratumumab, Velcade, Revlimid and dexamethasone started on 12/05/2022 status post 4 cycle.  The patient has had treatment held periodically due to thrombocytopenia.  His dose of Revlimid was reduced to 15 milligrams due to thrombocytopenia.  The patient was seen with Dr. Arbutus Ped today.  Patient recently had a repeat myeloma panel performed.  Dr. Arbutus Ped reviewed his myeloma panel and discussed results with patient today.  His labs show ***.  Recommend ***.  Doose dose of Revlimid again?  Reduced dose of Velcade?  Pete myeloma panel?  The patient also completed SBRT to the suspicious right lower lobe lung cancer.    For anticoagulation, he is currently on Coumadin.         No orders of the defined types were placed in this encounter.    I spent {CHL ONC TIME VISIT - MWUXL:[id number]} counseling the patient face to face. The total time spent in the appointment was {CHL ONC TIME VISIT - DGUYQ:[id number]}.  Leul Narramore L Uri Covey, PA-C 03/04/23

## 2023-03-06 ENCOUNTER — Other Ambulatory Visit: Payer: Medicare Other

## 2023-03-06 ENCOUNTER — Inpatient Hospital Stay (HOSPITAL_BASED_OUTPATIENT_CLINIC_OR_DEPARTMENT_OTHER): Payer: Medicare Other | Admitting: Physician Assistant

## 2023-03-06 ENCOUNTER — Ambulatory Visit: Payer: Medicare Other | Admitting: Physician Assistant

## 2023-03-06 ENCOUNTER — Ambulatory Visit: Payer: Medicare Other

## 2023-03-06 ENCOUNTER — Inpatient Hospital Stay: Payer: Medicare Other

## 2023-03-06 ENCOUNTER — Other Ambulatory Visit: Payer: Self-pay

## 2023-03-06 ENCOUNTER — Telehealth: Payer: Self-pay

## 2023-03-06 ENCOUNTER — Ambulatory Visit (HOSPITAL_COMMUNITY)
Admission: RE | Admit: 2023-03-06 | Discharge: 2023-03-06 | Disposition: A | Discharge status: Home / Self Care | Payer: Medicare Other | Source: Ambulatory Visit | Attending: Physician Assistant | Admitting: Physician Assistant

## 2023-03-06 VITALS — BP 122/80 | HR 101 | Temp 97.7°F | Resp 17 | Wt 189.9 lb

## 2023-03-06 VITALS — HR 91

## 2023-03-06 DIAGNOSIS — G893 Neoplasm related pain (acute) (chronic): Secondary | ICD-10-CM | POA: Insufficient documentation

## 2023-03-06 DIAGNOSIS — C9000 Multiple myeloma not having achieved remission: Secondary | ICD-10-CM

## 2023-03-06 DIAGNOSIS — Z5112 Encounter for antineoplastic immunotherapy: Secondary | ICD-10-CM | POA: Diagnosis not present

## 2023-03-06 DIAGNOSIS — C3491 Malignant neoplasm of unspecified part of right bronchus or lung: Secondary | ICD-10-CM | POA: Diagnosis not present

## 2023-03-06 LAB — CMP (CANCER CENTER ONLY)
ALT: 18 U/L (ref 0–44)
AST: 16 U/L (ref 15–41)
Albumin: 3.6 g/dL (ref 3.5–5.0)
Alkaline Phosphatase: 63 U/L (ref 38–126)
Anion gap: 6 (ref 5–15)
BUN: 22 mg/dL (ref 8–23)
CO2: 29 mmol/L (ref 22–32)
Calcium: 9.3 mg/dL (ref 8.9–10.3)
Chloride: 105 mmol/L (ref 98–111)
Creatinine: 0.93 mg/dL (ref 0.61–1.24)
GFR, Estimated: >60 mL/min (ref >60)
Glucose, Bld: 115 mg/dL — ABNORMAL HIGH (ref 70–99)
Potassium: 3.7 mmol/L (ref 3.5–5.1)
Sodium: 140 mmol/L (ref 135–145)
Total Bilirubin: 1.6 mg/dL — ABNORMAL HIGH (ref 0.3–1.2)
Total Protein: 5.3 g/dL — ABNORMAL LOW (ref 6.5–8.1)

## 2023-03-06 LAB — CBC WITH DIFFERENTIAL (CANCER CENTER ONLY)
Abs Immature Granulocytes: 0.04 10*3/uL (ref 0.00–0.07)
Basophils Absolute: 0.1 10*3/uL (ref 0.0–0.1)
Basophils Relative: 1 %
Eosinophils Absolute: 0.1 10*3/uL (ref 0.0–0.5)
Eosinophils Relative: 1 %
HCT: 42.3 % (ref 39.0–52.0)
Hemoglobin: 14.6 g/dL (ref 13.0–17.0)
Immature Granulocytes: 0 %
Lymphocytes Relative: 10 %
Lymphs Abs: 0.9 10*3/uL (ref 0.7–4.0)
MCH: 34.3 pg — ABNORMAL HIGH (ref 26.0–34.0)
MCHC: 34.5 g/dL (ref 30.0–36.0)
MCV: 99.3 fL (ref 80.0–100.0)
Monocytes Absolute: 0.8 10*3/uL (ref 0.1–1.0)
Monocytes Relative: 8 %
Neutro Abs: 7.5 10*3/uL (ref 1.7–7.7)
Neutrophils Relative %: 80 %
Platelet Count: 98 10*3/uL — ABNORMAL LOW (ref 150–400)
RBC: 4.26 MIL/uL (ref 4.22–5.81)
RDW: 16.3 % — ABNORMAL HIGH (ref 11.5–15.5)
WBC Count: 9.4 10*3/uL (ref 4.0–10.5)
nRBC: 0 % (ref 0.0–0.2)

## 2023-03-06 MED ORDER — ACETAMINOPHEN 325 MG PO TABS
650.0000 mg | ORAL_TABLET | Freq: Once | ORAL | Status: AC
  Administered 2023-03-06: 650 mg via ORAL
  Filled 2023-03-06: qty 2

## 2023-03-06 MED ORDER — DIPHENHYDRAMINE HCL 25 MG PO CAPS
50.0000 mg | ORAL_CAPSULE | Freq: Once | ORAL | Status: AC
  Administered 2023-03-06: 50 mg via ORAL
  Filled 2023-03-06: qty 2

## 2023-03-06 MED ORDER — BORTEZOMIB CHEMO SQ INJECTION 3.5 MG (2.5MG/ML)
1.3000 mg/m2 | Freq: Once | INTRAMUSCULAR | Status: AC
  Administered 2023-03-06: 2.75 mg via SUBCUTANEOUS
  Filled 2023-03-06: qty 1.1

## 2023-03-06 MED ORDER — DARATUMUMAB-HYALURONIDASE-FIHJ 1800-30000 MG-UT/15ML ~~LOC~~ SOLN
1800.0000 mg | Freq: Once | SUBCUTANEOUS | Status: AC
  Administered 2023-03-06: 1800 mg via SUBCUTANEOUS
  Filled 2023-03-06: qty 15

## 2023-03-06 MED ORDER — TRAMADOL HCL 50 MG PO TABS
50.0000 mg | ORAL_TABLET | Freq: Two times a day (BID) | ORAL | 0 refills | Status: DC | PRN
Start: 2023-03-06 — End: 2023-12-07

## 2023-03-06 NOTE — Progress Notes (Signed)
Ok to treat with Platelets of 98 and Bilirubin 1.6, per Cassie, PA.  Pt took 20mg  dexamethasone at home.

## 2023-03-06 NOTE — Telephone Encounter (Signed)
This nurse reached out to patient and made him aware that results of his xray have been reviewed and the provider would like him to know  the xray did not show any acute findings in his spine (aka no worsening fracture). It did mention again that there are some mild to moderate degenerative changes in his low back. Of course, x-rays do have limitations and are not as good at seeing nerve or soft tissue. So if any worsening symptoms, the provider recommends seeing his orthopedic provider.   Patient acknowledged understanding.  No further questions or concerns noted at this time.

## 2023-03-06 NOTE — Patient Instructions (Signed)
[facility]  Discharge Instructions: Thank you for choosing [facility] to provide your oncology and hematology care.   If you have a lab appointment with the Cancer Center, please go directly to the Cancer Center and check in at the registration area.   Wear comfortable clothing and clothing appropriate for easy access to any Portacath or PICC line.   We strive to give you quality time with your provider. You may need to reschedule your appointment if you arrive late (15 or more minutes).  Arriving late affects you and other patients whose appointments are after yours.  Also, if you miss three or more appointments without notifying the office, you may be dismissed from the clinic at the provider's discretion.      For prescription refill requests, have your pharmacy contact our office and allow 72 hours for refills to be completed.    Today you received the following chemotherapy and/or immunotherapy agents: Velcade/Darzalex Faspro      To help prevent nausea and vomiting after your treatment, we encourage you to take your nausea medication as directed.  BELOW ARE SYMPTOMS THAT SHOULD BE REPORTED IMMEDIATELY: *FEVER GREATER THAN 100.4 F (38 C) OR HIGHER *CHILLS OR SWEATING *NAUSEA AND VOMITING THAT IS NOT CONTROLLED WITH YOUR NAUSEA MEDICATION *UNUSUAL SHORTNESS OF BREATH *UNUSUAL BRUISING OR BLEEDING *URINARY PROBLEMS (pain or burning when urinating, or frequent urination) *BOWEL PROBLEMS (unusual diarrhea, constipation, pain near the anus) TENDERNESS IN MOUTH AND THROAT WITH OR WITHOUT PRESENCE OF ULCERS (sore throat, sores in mouth, or a toothache) UNUSUAL RASH, SWELLING OR PAIN  UNUSUAL VAGINAL DISCHARGE OR ITCHING   Items with * indicate a potential emergency and should be followed up as soon as possible or go to the Emergency Department if any problems should occur.  Please show the CHEMOTHERAPY ALERT CARD or IMMUNOTHERAPY ALERT  CARD at check-in to the Emergency Department and triage nurse.  Should you have questions after your visit or need to cancel or reschedule your appointment, please contact [facility]  Dept: [phone]  and follow the prompts.  Office hours are 8:00 a.m. to 4:30 p.m. Monday - Friday. Please note that voicemails left after 4:00 p.m. may not be returned until the following business day.  We are closed weekends and major holidays. You have access to a nurse at all times for urgent questions. Please call the main number to the clinic Dept: [phone] and follow the prompts.   For any non-urgent questions, you may also contact your provider using MyChart. We now offer e-Visits for anyone 18 and older to request care online for non-urgent symptoms. For details visit [email or web address].   Also download the MyChart app! Go to the app store, search "MyChart", open the app, select [facility], and log in with your MyChart username and password.   

## 2023-03-09 ENCOUNTER — Ambulatory Visit: Payer: Medicare Other

## 2023-03-09 ENCOUNTER — Other Ambulatory Visit: Payer: Self-pay

## 2023-03-09 ENCOUNTER — Inpatient Hospital Stay: Payer: Medicare Other | Attending: Internal Medicine

## 2023-03-09 VITALS — BP 107/77 | HR 83 | Temp 97.8°F | Resp 14 | Ht 72.0 in | Wt 194.0 lb

## 2023-03-09 DIAGNOSIS — C9000 Multiple myeloma not having achieved remission: Secondary | ICD-10-CM | POA: Insufficient documentation

## 2023-03-09 DIAGNOSIS — Z5112 Encounter for antineoplastic immunotherapy: Secondary | ICD-10-CM | POA: Diagnosis present

## 2023-03-09 MED ORDER — PROCHLORPERAZINE MALEATE 10 MG PO TABS
10.0000 mg | ORAL_TABLET | Freq: Once | ORAL | Status: AC
  Administered 2023-03-09: 10 mg via ORAL
  Filled 2023-03-09: qty 1

## 2023-03-09 MED ORDER — BORTEZOMIB CHEMO SQ INJECTION 3.5 MG (2.5MG/ML)
1.3000 mg/m2 | Freq: Once | INTRAMUSCULAR | Status: AC
  Administered 2023-03-09: 2.75 mg via SUBCUTANEOUS
  Filled 2023-03-09: qty 1.1

## 2023-03-09 NOTE — Patient Instructions (Signed)
[facility]  Discharge Instructions: Thank you for choosing [facility] to provide your oncology and hematology care.   If you have a lab appointment with the Cancer Center, please go directly to the Cancer Center and check in at the registration area.   Wear comfortable clothing and clothing appropriate for easy access to any Portacath or PICC line.   We strive to give you quality time with your provider. You may need to reschedule your appointment if you arrive late (15 or more minutes).  Arriving late affects you and other patients whose appointments are after yours.  Also, if you miss three or more appointments without notifying the office, you may be dismissed from the clinic at the provider's discretion.      For prescription refill requests, have your pharmacy contact our office and allow 72 hours for refills to be completed.    Today you received the following chemotherapy and/or immunotherapy agent: Bortezomib (Velcade).     To help prevent nausea and vomiting after your treatment, we encourage you to take your nausea medication as directed.  BELOW ARE SYMPTOMS THAT SHOULD BE REPORTED IMMEDIATELY: *FEVER GREATER THAN 100.4 F (38 C) OR HIGHER *CHILLS OR SWEATING *NAUSEA AND VOMITING THAT IS NOT CONTROLLED WITH YOUR NAUSEA MEDICATION *UNUSUAL SHORTNESS OF BREATH *UNUSUAL BRUISING OR BLEEDING *URINARY PROBLEMS (pain or burning when urinating, or frequent urination) *BOWEL PROBLEMS (unusual diarrhea, constipation, pain near the anus) TENDERNESS IN MOUTH AND THROAT WITH OR WITHOUT PRESENCE OF ULCERS (sore throat, sores in mouth, or a toothache) UNUSUAL RASH, SWELLING OR PAIN  UNUSUAL VAGINAL DISCHARGE OR ITCHING   Items with * indicate a potential emergency and should be followed up as soon as possible or go to the Emergency Department if any problems should occur.  Please show the CHEMOTHERAPY ALERT CARD or IMMUNOTHERAPY ALERT  CARD at check-in to the Emergency Department and triage nurse.  Should you have questions after your visit or need to cancel or reschedule your appointment, please contact [facility]  Dept: [phone]  and follow the prompts.  Office hours are 8:00 a.m. to 4:30 p.m. Monday - Friday. Please note that voicemails left after 4:00 p.m. may not be returned until the following business day.  We are closed weekends and major holidays. You have access to a nurse at all times for urgent questions. Please call the main number to the clinic Dept: [phone] and follow the prompts.   For any non-urgent questions, you may also contact your provider using MyChart. We now offer e-Visits for anyone 45 and older to request care online for non-urgent symptoms. For details visit [email or web address].   Also download the MyChart app! Go to the app store, search "MyChart", open the app, select [facility], and log in with your MyChart username and password.

## 2023-03-10 ENCOUNTER — Encounter: Payer: Self-pay | Admitting: Internal Medicine

## 2023-03-11 ENCOUNTER — Other Ambulatory Visit: Payer: Self-pay

## 2023-03-11 ENCOUNTER — Encounter (HOSPITAL_COMMUNITY): Payer: Self-pay

## 2023-03-11 ENCOUNTER — Emergency Department (HOSPITAL_COMMUNITY)
Admission: EM | Admit: 2023-03-11 | Discharge: 2023-03-11 | Disposition: A | Discharge status: Home / Self Care | Payer: Medicare Other | Attending: Emergency Medicine | Admitting: Emergency Medicine

## 2023-03-11 DIAGNOSIS — Z79899 Other long term (current) drug therapy: Secondary | ICD-10-CM | POA: Insufficient documentation

## 2023-03-11 DIAGNOSIS — Z7901 Long term (current) use of anticoagulants: Secondary | ICD-10-CM | POA: Diagnosis not present

## 2023-03-11 DIAGNOSIS — R030 Elevated blood-pressure reading, without diagnosis of hypertension: Secondary | ICD-10-CM | POA: Insufficient documentation

## 2023-03-11 DIAGNOSIS — I4891 Unspecified atrial fibrillation: Secondary | ICD-10-CM | POA: Insufficient documentation

## 2023-03-11 DIAGNOSIS — I509 Heart failure, unspecified: Secondary | ICD-10-CM | POA: Diagnosis not present

## 2023-03-11 DIAGNOSIS — I110 Hypertensive heart disease with heart failure: Secondary | ICD-10-CM | POA: Insufficient documentation

## 2023-03-11 DIAGNOSIS — Z8579 Personal history of other malignant neoplasms of lymphoid, hematopoietic and related tissues: Secondary | ICD-10-CM | POA: Diagnosis not present

## 2023-03-11 DIAGNOSIS — I2510 Atherosclerotic heart disease of native coronary artery without angina pectoris: Secondary | ICD-10-CM | POA: Insufficient documentation

## 2023-03-11 DIAGNOSIS — Z8546 Personal history of malignant neoplasm of prostate: Secondary | ICD-10-CM | POA: Diagnosis not present

## 2023-03-11 NOTE — Discharge Instructions (Signed)
You were seen in the ER for elevated blood pressure.  As we discussed, your blood pressure can be affected by several factors. Some of which including pain, lack of sleep, anxiety or worry. I recommend trying to split your tramadol tablet in half if you're concerned about the side effects.  Please keep a blood pressure log at home. I recommend checking your blood pressure twice a day, at the same times every day. Keep track if you are experiencing any symptoms as well, such as pain. Go over these results with your primary doctor to discuss any possible medication changes.   Continue to monitor how you're doing and return to the ER for new or worsening symptoms such as headache, blurry vision, chest pain, shortness of breath, etc.

## 2023-03-11 NOTE — ED Provider Notes (Signed)
[place] EMERGENCY DEPARTMENT AT [facility] Provider Note   CSN: [id number] Arrival date & time: 03/11/23  4132     History  Chief Complaint  Patient presents with   Hypertension    Ronald Duran is a 82 y.o. male with history of HLD, HTN, CAD s/p cath 2016, prostate cancer, multiple myeloma, hiatal hernia, CHF (LVEF 45-50%, echo 2005), PAD, atrial fibrillation on warfarin, who presents to the ER due to elevated blood pressure readings. Patient states he has been experiencing more pain in his back due to bone metastases, is on injections for this. He was having back pain last night that kept him from sleep. He began checking his blood pressure, almost every hour, throughout the night. He took his home BP med around 5 am because he was nervous his BP readings were high. After taking this his BP continued to be around 150 systolic, so he came to the ER for evaluation. Denies any symptoms such as headache, blurry vision, chest pain, SOB.    Hypertension       Home Medications Prior to Admission medications   Medication Sig Start Date End Date Taking? Authorizing Provider  acyclovir (ZOVIRAX) 200 MG capsule Take 1 capsule (200 mg total) by mouth 2 (two) times daily. 11/23/22   Si Gaul, MD  atorvastatin (LIPITOR) 40 MG tablet Take 40 mg by mouth at bedtime. 04/22/17   [provider]  B Complex Vitamins (B COMPLEX 100) tablet Take 1 tablet by mouth daily. Balance    [provider]  cetirizine (ZYRTEC ALLERGY) 10 MG tablet Take 1 tablet (10 mg total) by mouth daily. 01/14/23   Wallis Bamberg, PA-C  Cholecalciferol (VITAMIN D3) 2000 UNITS TABS Take 2,000 Units by mouth daily.    [provider]  Coenzyme Q10 (CO Q-10) 100 MG CAPS Take 100 mg by mouth daily with supper.    [provider]  dexamethasone (DECADRON) 4 MG tablet 5 tab weekly start with the first dose of chemotherapy. Patient taking differently: Take 20 mg by mouth See  admin instructions. Take 5 tablets by mouth weekly start with the first dose of chemotherapy. Patient states he did take today because he thought maybe he would have Chemo today per patient 11/23/22   Si Gaul, MD  lenalidomide (REVLIMID) 15 MG capsule Take 1 capsule (15 mg total) by mouth daily. Take for 14 days, then none for 7 days. Repeat every 21 days. Start Revlimid on day 1 of chemotherapy Auth #[id number] date obtained 12/19/2022 Adult Male. Patient not taking: Reported on 02/03/2023 12/28/22   Si Gaul, MD  metoprolol tartrate (LOPRESSOR) 50 MG tablet Take 1 tablet (50 mg total) by mouth 2 (two) times daily. 01/28/23   Meredeth Ide, MD  Multiple Vitamins-Minerals (CENTRUM) tablet Take 1 tablet by mouth daily.    [provider]  nitroGLYCERIN (NITROSTAT) 0.4 MG SL tablet PLACE 1 TABLET UNDER THE TONGUE IF NEEDED EVERY 5 MINUTES FOR CHEST PAIN FOR 3 DOSES IF NO RELIEF AFTER FIRST DOSE CALL 911. Patient taking differently: Place 0.4 mg under the tongue every 5 (five) minutes as needed for chest pain. 01/26/22   [name], MD  omeprazole (PRILOSEC) 20 MG capsule Take 20 mg by mouth daily before breakfast. 06/28/22   [provider]  prochlorperazine (COMPAZINE) 10 MG tablet Take 1 tablet (10 mg total) by mouth every 6 (six) hours as needed for nausea or vomiting. 11/24/22   Si Gaul, MD  traMADol Janean Sark)  50 MG tablet Take 1 tablet (50 mg total) by mouth every 12 (twelve) hours as needed. 03/06/23   Heilingoetter, Cassandra L, PA-C  triamcinolone ointment (KENALOG) 0.5 % Apply 1 Application topically 2 (two) times daily. Patient taking differently: Apply 1 Application topically daily as needed (For left foot rash). 12/19/22   Walisiewicz, Yvonna Alanis E, PA-C  warfarin (COUMADIN) 5 MG tablet TAKE 1/2 TO 1 TABLET BY MOUTH AS DIRECTED BY COUMADIN CLINIC. 02/06/23   [name], MD      Allergies    Patient has no known allergies.    Review of Systems    Review of Systems  All other systems reviewed and are negative.   Physical Exam Updated Vital Signs BP (!) 146/106   Pulse 74   Temp 97.6 F (36.4 C) (Oral)   Resp 15   Ht 6' (1.829 m)   Wt 88 kg   SpO2 97%   BMI 26.31 kg/m  Physical Exam Vitals and nursing note reviewed.  Constitutional:      Appearance: Normal appearance.  HENT:     Head: Normocephalic and atraumatic.  Eyes:     Conjunctiva/sclera: Conjunctivae normal.  Cardiovascular:     Rate and Rhythm: Normal rate and regular rhythm.  Pulmonary:     Effort: Pulmonary effort is normal. No respiratory distress.     Breath sounds: Normal breath sounds.  Abdominal:     General: There is no distension.     Palpations: Abdomen is soft.     Tenderness: There is no abdominal tenderness.  Skin:    General: Skin is warm and dry.  Neurological:     General: No focal deficit present.     Mental Status: He is alert.     ED Results / Procedures / Treatments   Labs (all labs ordered are listed, but only abnormal results are displayed) Labs Reviewed - No data to display  EKG None  Radiology No results found.  Procedures Procedures    Medications Ordered in ED Medications - No data to display  ED Course/ Medical Decision Making/ A&P                                 Medical Decision Making  This patient is a 82 y.o. male who presents to the ED for concern of elevated blood pressure readings. Asymptomatic.   Past Medical History / Social History / Additional history: Chart reviewed. Pertinent results include: HLD, HTN, CAD s/p cath 2016, prostate cancer, multiple myeloma, hiatal hernia, CHF (LVEF 45-50%, echo 2005), PAD, atrial fibrillation on warfarin  Had recent changes in BP medication management due to intermittent hypotension. Reviewed most recent oncology note detailing this.   Physical Exam: Physical exam performed. The pertinent findings include: BP 146/106. No symptoms. Heart regular rate and  rhythm.   Disposition: After consideration of the diagnostic results and the patients response to treatment, I feel that emergency department workup does not suggest an emergent condition requiring admission or immediate intervention beyond what has been performed at this time. The plan is: discharge to home with reassurance.  As patient is having no symptoms, and blood pressures remaining stable, I do not believe he is requiring further evaluation at this time.  I advised him that his readings could have been higher due to the pain that he was experiencing overnight, as well as his frequent checking.  Will recommend that he keep a daily  log of his blood pressures, gave him instructions on how to do this.  I do not feel comfortable changing patient's medication at this time, as he has previously struggled with hypotension.  Patient is agreeable to this plan.  Will recommend he follow-up with his PCP regarding his blood pressure logs.  The patient is safe for discharge and has been instructed to return immediately for worsening symptoms, change in symptoms or any other concerns.  Final Clinical Impression(s) / ED Diagnoses Final diagnoses:  Elevated blood pressure reading  History of multiple myeloma    Rx / DC Orders ED Discharge Orders     None      Portions of this report may have been transcribed using voice recognition software. Every effort was made to ensure accuracy; however, inadvertent computerized transcription errors may be present.    Jeanella Flattery 03/11/23 0825    Lorre Nick, MD 03/12/23 [phone]

## 2023-03-11 NOTE — ED Triage Notes (Signed)
Last night pt took his blood pressure and measure 150/100. Pt took his Metoprolol at 0445, pt bp still measuring 149/109. Denies any pain N/V at this time.

## 2023-03-13 ENCOUNTER — Inpatient Hospital Stay: Payer: Medicare Other

## 2023-03-13 ENCOUNTER — Inpatient Hospital Stay: Payer: Medicare Other | Admitting: Physician Assistant

## 2023-03-13 ENCOUNTER — Other Ambulatory Visit: Payer: Self-pay

## 2023-03-13 VITALS — BP 98/70 | HR 76 | Temp 97.7°F | Resp 18 | Wt 197.5 lb

## 2023-03-13 DIAGNOSIS — C9000 Multiple myeloma not having achieved remission: Secondary | ICD-10-CM

## 2023-03-13 DIAGNOSIS — Z5112 Encounter for antineoplastic immunotherapy: Secondary | ICD-10-CM | POA: Diagnosis not present

## 2023-03-13 LAB — CMP (CANCER CENTER ONLY)
ALT: 22 U/L (ref 0–44)
AST: 20 U/L (ref 15–41)
Albumin: 3.4 g/dL — ABNORMAL LOW (ref 3.5–5.0)
Alkaline Phosphatase: 58 U/L (ref 38–126)
Anion gap: 5 (ref 5–15)
BUN: 24 mg/dL — ABNORMAL HIGH (ref 8–23)
CO2: 27 mmol/L (ref 22–32)
Calcium: 9 mg/dL (ref 8.9–10.3)
Chloride: 103 mmol/L (ref 98–111)
Creatinine: 0.94 mg/dL (ref 0.61–1.24)
GFR, Estimated: >60 mL/min (ref >60)
Glucose, Bld: 113 mg/dL — ABNORMAL HIGH (ref 70–99)
Potassium: 4 mmol/L (ref 3.5–5.1)
Sodium: 135 mmol/L (ref 135–145)
Total Bilirubin: 1 mg/dL (ref 0.3–1.2)
Total Protein: 5.2 g/dL — ABNORMAL LOW (ref 6.5–8.1)

## 2023-03-13 LAB — CBC WITH DIFFERENTIAL (CANCER CENTER ONLY)
Abs Immature Granulocytes: 0.16 10*3/uL — ABNORMAL HIGH (ref 0.00–0.07)
Basophils Absolute: 0 10*3/uL (ref 0.0–0.1)
Basophils Relative: 0 %
Eosinophils Absolute: 0.1 10*3/uL (ref 0.0–0.5)
Eosinophils Relative: 1 %
HCT: 40.3 % (ref 39.0–52.0)
Hemoglobin: 14 g/dL (ref 13.0–17.0)
Immature Granulocytes: 2 %
Lymphocytes Relative: 4 %
Lymphs Abs: 0.3 10*3/uL — ABNORMAL LOW (ref 0.7–4.0)
MCH: 34.1 pg — ABNORMAL HIGH (ref 26.0–34.0)
MCHC: 34.7 g/dL (ref 30.0–36.0)
MCV: 98.3 fL (ref 80.0–100.0)
Monocytes Absolute: 0.6 10*3/uL (ref 0.1–1.0)
Monocytes Relative: 7 %
Neutro Abs: 8 10*3/uL — ABNORMAL HIGH (ref 1.7–7.7)
Neutrophils Relative %: 86 %
Platelet Count: 22 10*3/uL — ABNORMAL LOW (ref 150–400)
RBC: 4.1 MIL/uL — ABNORMAL LOW (ref 4.22–5.81)
RDW: 16.2 % — ABNORMAL HIGH (ref 11.5–15.5)
WBC Count: 9.2 10*3/uL (ref 4.0–10.5)
nRBC: 1.1 % — ABNORMAL HIGH (ref 0.0–0.2)

## 2023-03-13 MED ORDER — ZOLEDRONIC ACID 4 MG/100ML IV SOLN
4.0000 mg | Freq: Once | INTRAVENOUS | Status: AC
  Administered 2023-03-13: 4 mg via INTRAVENOUS
  Filled 2023-03-13: qty 100

## 2023-03-13 MED ORDER — SODIUM CHLORIDE 0.9 % IV SOLN: Freq: Once | INTRAVENOUS | Status: AC

## 2023-03-13 NOTE — Progress Notes (Signed)
Per Dr Shirline Frees, hold Velcade for plts of 122.

## 2023-03-14 ENCOUNTER — Ambulatory Visit: Payer: Medicare Other | Attending: Internal Medicine

## 2023-03-14 DIAGNOSIS — Z79899 Other long term (current) drug therapy: Secondary | ICD-10-CM | POA: Diagnosis not present

## 2023-03-14 DIAGNOSIS — I4819 Other persistent atrial fibrillation: Secondary | ICD-10-CM | POA: Diagnosis not present

## 2023-03-14 DIAGNOSIS — I6349 Cerebral infarction due to embolism of other cerebral artery: Secondary | ICD-10-CM | POA: Diagnosis not present

## 2023-03-14 DIAGNOSIS — Z7901 Long term (current) use of anticoagulants: Secondary | ICD-10-CM

## 2023-03-14 LAB — POCT INR: INR: 3.1 — AB (ref 2.0–3.0)

## 2023-03-14 NOTE — Patient Instructions (Signed)
Continue taking warfarin 1 tablet daily EXCEPT 1/2 Mondays, Wednesdays, and Fridays.  Continue eating broccoli every other day Recheck INR 4 weeks.  Anticoagulation Clinic-[phone].

## 2023-03-15 ENCOUNTER — Telehealth: Payer: Self-pay | Admitting: Medical Oncology

## 2023-03-15 ENCOUNTER — Encounter: Payer: Self-pay | Admitting: Internal Medicine

## 2023-03-15 ENCOUNTER — Other Ambulatory Visit: Payer: Self-pay | Admitting: Medical Oncology

## 2023-03-15 DIAGNOSIS — C9000 Multiple myeloma not having achieved remission: Secondary | ICD-10-CM

## 2023-03-15 DIAGNOSIS — D696 Thrombocytopenia, unspecified: Secondary | ICD-10-CM

## 2023-03-15 NOTE — Telephone Encounter (Signed)
Pt notified of cancelled appts this week due to low plts. Bleeding precautions reviewed with pt. He was notified to keep appt on 08/19.

## 2023-03-16 ENCOUNTER — Ambulatory Visit: Payer: Medicare Other

## 2023-03-16 ENCOUNTER — Inpatient Hospital Stay: Payer: Medicare Other

## 2023-03-20 ENCOUNTER — Ambulatory Visit: Payer: Medicare Other

## 2023-03-20 ENCOUNTER — Encounter: Payer: Self-pay | Admitting: Physician Assistant

## 2023-03-20 ENCOUNTER — Ambulatory Visit: Payer: Medicare Other | Admitting: Nurse Practitioner

## 2023-03-20 ENCOUNTER — Encounter: Payer: Self-pay | Admitting: Internal Medicine

## 2023-03-20 ENCOUNTER — Ambulatory Visit: Payer: Medicare Other | Admitting: Physician Assistant

## 2023-03-20 ENCOUNTER — Other Ambulatory Visit: Payer: Self-pay

## 2023-03-20 ENCOUNTER — Inpatient Hospital Stay: Payer: Medicare Other

## 2023-03-20 ENCOUNTER — Other Ambulatory Visit: Payer: Medicare Other

## 2023-03-20 DIAGNOSIS — D696 Thrombocytopenia, unspecified: Secondary | ICD-10-CM

## 2023-03-20 DIAGNOSIS — Z5112 Encounter for antineoplastic immunotherapy: Secondary | ICD-10-CM | POA: Diagnosis not present

## 2023-03-20 DIAGNOSIS — C9000 Multiple myeloma not having achieved remission: Secondary | ICD-10-CM

## 2023-03-20 LAB — CMP (CANCER CENTER ONLY)
ALT: 20 U/L (ref 0–44)
AST: 20 U/L (ref 15–41)
Albumin: 3.6 g/dL (ref 3.5–5.0)
Alkaline Phosphatase: 64 U/L (ref 38–126)
Anion gap: 6 (ref 5–15)
BUN: 15 mg/dL (ref 8–23)
CO2: 26 mmol/L (ref 22–32)
Calcium: 8.5 mg/dL — ABNORMAL LOW (ref 8.9–10.3)
Chloride: 107 mmol/L (ref 98–111)
Creatinine: 0.93 mg/dL (ref 0.61–1.24)
GFR, Estimated: >60 mL/min (ref >60)
Glucose, Bld: 86 mg/dL (ref 70–99)
Potassium: 3.9 mmol/L (ref 3.5–5.1)
Sodium: 139 mmol/L (ref 135–145)
Total Bilirubin: 1.6 mg/dL — ABNORMAL HIGH (ref 0.3–1.2)
Total Protein: 5.4 g/dL — ABNORMAL LOW (ref 6.5–8.1)

## 2023-03-20 LAB — CBC WITH DIFFERENTIAL (CANCER CENTER ONLY)
Abs Immature Granulocytes: 0.05 10*3/uL (ref 0.00–0.07)
Basophils Absolute: 0 10*3/uL (ref 0.0–0.1)
Basophils Relative: 0 %
Eosinophils Absolute: 0.1 10*3/uL (ref 0.0–0.5)
Eosinophils Relative: 1 %
HCT: 40 % (ref 39.0–52.0)
Hemoglobin: 13.7 g/dL (ref 13.0–17.0)
Immature Granulocytes: 1 %
Lymphocytes Relative: 6 %
Lymphs Abs: 0.6 10*3/uL — ABNORMAL LOW (ref 0.7–4.0)
MCH: 34.5 pg — ABNORMAL HIGH (ref 26.0–34.0)
MCHC: 34.3 g/dL (ref 30.0–36.0)
MCV: 100.8 fL — ABNORMAL HIGH (ref 80.0–100.0)
Monocytes Absolute: 0.8 10*3/uL (ref 0.1–1.0)
Monocytes Relative: 8 %
Neutro Abs: 8.7 10*3/uL — ABNORMAL HIGH (ref 1.7–7.7)
Neutrophils Relative %: 84 %
Platelet Count: 88 10*3/uL — ABNORMAL LOW (ref 150–400)
RBC: 3.97 MIL/uL — ABNORMAL LOW (ref 4.22–5.81)
RDW: 16.7 % — ABNORMAL HIGH (ref 11.5–15.5)
WBC Count: 10.3 10*3/uL (ref 4.0–10.5)
nRBC: 0 % (ref 0.0–0.2)

## 2023-03-23 ENCOUNTER — Ambulatory Visit: Payer: Medicare Other

## 2023-03-27 ENCOUNTER — Inpatient Hospital Stay: Payer: Medicare Other

## 2023-03-27 ENCOUNTER — Inpatient Hospital Stay (HOSPITAL_BASED_OUTPATIENT_CLINIC_OR_DEPARTMENT_OTHER): Payer: Medicare Other | Admitting: Internal Medicine

## 2023-03-27 ENCOUNTER — Other Ambulatory Visit: Payer: Self-pay | Admitting: Medical Oncology

## 2023-03-27 ENCOUNTER — Telehealth: Payer: Self-pay | Admitting: Medical Oncology

## 2023-03-27 VITALS — BP 127/94 | HR 88 | Temp 97.7°F | Resp 18 | Ht 72.0 in | Wt 195.6 lb

## 2023-03-27 DIAGNOSIS — Z5112 Encounter for antineoplastic immunotherapy: Secondary | ICD-10-CM | POA: Diagnosis not present

## 2023-03-27 DIAGNOSIS — C9000 Multiple myeloma not having achieved remission: Secondary | ICD-10-CM

## 2023-03-27 DIAGNOSIS — R609 Edema, unspecified: Secondary | ICD-10-CM

## 2023-03-27 LAB — CMP (CANCER CENTER ONLY)
ALT: 25 U/L (ref 0–44)
AST: 18 U/L (ref 15–41)
Albumin: 3.7 g/dL (ref 3.5–5.0)
Alkaline Phosphatase: 84 U/L (ref 38–126)
Anion gap: 6 (ref 5–15)
BUN: 15 mg/dL (ref 8–23)
CO2: 28 mmol/L (ref 22–32)
Calcium: 8.9 mg/dL (ref 8.9–10.3)
Chloride: 105 mmol/L (ref 98–111)
Creatinine: 0.84 mg/dL (ref 0.61–1.24)
GFR, Estimated: >60 mL/min (ref >60)
Glucose, Bld: 109 mg/dL — ABNORMAL HIGH (ref 70–99)
Potassium: 4 mmol/L (ref 3.5–5.1)
Sodium: 139 mmol/L (ref 135–145)
Total Bilirubin: 1.6 mg/dL — ABNORMAL HIGH (ref 0.3–1.2)
Total Protein: 5.7 g/dL — ABNORMAL LOW (ref 6.5–8.1)

## 2023-03-27 LAB — CBC WITH DIFFERENTIAL (CANCER CENTER ONLY)
Abs Immature Granulocytes: 0.02 10*3/uL (ref 0.00–0.07)
Basophils Absolute: 0 10*3/uL (ref 0.0–0.1)
Basophils Relative: 0 %
Eosinophils Absolute: 0.1 10*3/uL (ref 0.0–0.5)
Eosinophils Relative: 1 %
HCT: 42.1 % (ref 39.0–52.0)
Hemoglobin: 14.1 g/dL (ref 13.0–17.0)
Immature Granulocytes: 0 %
Lymphocytes Relative: 4 %
Lymphs Abs: 0.4 10*3/uL — ABNORMAL LOW (ref 0.7–4.0)
MCH: 34.4 pg — ABNORMAL HIGH (ref 26.0–34.0)
MCHC: 33.5 g/dL (ref 30.0–36.0)
MCV: 102.7 fL — ABNORMAL HIGH (ref 80.0–100.0)
Monocytes Absolute: 0.5 10*3/uL (ref 0.1–1.0)
Monocytes Relative: 5 %
Neutro Abs: 9.2 10*3/uL — ABNORMAL HIGH (ref 1.7–7.7)
Neutrophils Relative %: 90 %
Platelet Count: 126 10*3/uL — ABNORMAL LOW (ref 150–400)
RBC: 4.1 MIL/uL — ABNORMAL LOW (ref 4.22–5.81)
RDW: 16.5 % — ABNORMAL HIGH (ref 11.5–15.5)
WBC Count: 10.3 10*3/uL (ref 4.0–10.5)
nRBC: 0 % (ref 0.0–0.2)

## 2023-03-27 MED ORDER — DARATUMUMAB-HYALURONIDASE-FIHJ 1800-30000 MG-UT/15ML ~~LOC~~ SOLN
1800.0000 mg | Freq: Once | SUBCUTANEOUS | Status: AC
  Administered 2023-03-27: 1800 mg via SUBCUTANEOUS
  Filled 2023-03-27: qty 15

## 2023-03-27 MED ORDER — ACETAMINOPHEN 325 MG PO TABS
650.0000 mg | ORAL_TABLET | Freq: Once | ORAL | Status: AC
  Administered 2023-03-27: 650 mg via ORAL
  Filled 2023-03-27: qty 2

## 2023-03-27 MED ORDER — DIPHENHYDRAMINE HCL 25 MG PO CAPS
50.0000 mg | ORAL_CAPSULE | Freq: Once | ORAL | Status: AC
  Administered 2023-03-27: 50 mg via ORAL
  Filled 2023-03-27: qty 2

## 2023-03-27 MED ORDER — BORTEZOMIB CHEMO SQ INJECTION 3.5 MG (2.5MG/ML)
1.3000 mg/m2 | Freq: Once | INTRAMUSCULAR | Status: AC
  Administered 2023-03-27: 2.75 mg via SUBCUTANEOUS
  Filled 2023-03-27: qty 1.1

## 2023-03-27 NOTE — Progress Notes (Signed)
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 Kathrynn Running.  CURRENT THERAPY: Systemic chemotherapy with daratumumab subcutaneously weekly in addition to Velcade 1.3 Mg/M2 on days 1, 4, 8, 11 every 3 weeks as well as Revlimid 25 mg p.o. daily for 14 days every 3 weeks and Decadron 20 mg p.o. weekly with chemotherapy.  First cycle expected on 12/05/2022.  Status post 5 cycles.  Starting from cycle #2 I will reduce the dose of Revlimid to 15 mg p.o. daily for 14 days every 3 weeks.  INTERVAL HISTORY: Ronald Duran 82 y.o. male returns to the clinic today for follow-up visit accompanied by his wife.  The patient is feeling fine with no concerning complaints except for lack of sleep when he takes tramadol for the pain in his left hip and leg.  He has some swelling and tenderness in his left leg.  He is currently on Coumadin for cardiac issues.  He is followed by the Coumadin clinic.  He denied having any current chest pain, shortness of breath, cough or hemoptysis.  He has no nausea, vomiting, diarrhea or constipation.  He has no headache or visual changes.  He is here today for evaluation before starting day 1 of cycle #6.  MEDICAL HISTORY: Past Medical History:  Diagnosis Date   Atrial fibrillation (HCC)    CAD (coronary artery disease)    a. s/p MI with LAD stent placement December, 1999 -last stress test November 2008 with no EKG evidence of ischemia  b. cath 07/23/2015 after abnormal ETT and nuc, occluded mid LAD stent with good distal collateral, medical therapy    Cardiomyopathy  (HCC)    Ischemic cardiomyopathy with EF 40% by echo 2005   COVID 2021   mild case   Dyslipidemia    Erectile dysfunction    GERD (gastroesophageal reflux disease)    Hiatal hernia    with esophageal strictures and dilatations in the past - Dr. [name]   History of nuclear stress test    Myoview 12/16: EF 47%, anterior, anteroseptal and inferoseptal defect suggesting infarct with peri-infarct ischemia, intermediate risk   HTN (hypertension)    Hx of seasonal allergies    Hyperlipidemia    Myocardial infarction (HCC) 08/01/1998   PAD (peripheral artery disease) (HCC)    Pre-diabetes    Prostate cancer (HCC)    with seed implant therapy following Lupron therapy in 2008 and 2009 - followed by Dr. Isabel Caprice   Ringworm    Stroke (HCC) 04/20/2017   seen on a MRI   Vitamin D deficiency     ALLERGIES:  has No Known Allergies.  MEDICATIONS:  Current Outpatient Medications  Medication Sig Dispense Refill   acyclovir (ZOVIRAX) 200 MG capsule Take 1 capsule (200 mg total) by mouth 2 (two) times daily. 60 capsule 5   atorvastatin (LIPITOR) 40 MG tablet Take 40 mg by mouth at bedtime.     B Complex Vitamins (B COMPLEX 100) tablet Take 1 tablet by mouth daily. Balance     cetirizine (ZYRTEC ALLERGY) 10 MG tablet Take 1 tablet (10  mg total) by mouth daily. 30 tablet 0   Cholecalciferol (VITAMIN D3) 2000 UNITS TABS Take 2,000 Units by mouth daily.     Coenzyme Q10 (CO Q-10) 100 MG CAPS Take 100 mg by mouth daily with supper.     dexamethasone (DECADRON) 4 MG tablet 5 tab weekly start with the first dose of chemotherapy. (Patient taking differently: Take 20 mg by mouth See admin instructions. Take 5 tablets by mouth weekly start with the first dose of chemotherapy. Patient states he did take today because he thought maybe he would have Chemo today per patient) 40 tablet 3   lenalidomide (REVLIMID) 15 MG capsule Take 1 capsule (15 mg total) by mouth daily. Take for 14 days, then none for 7 days.  Repeat every 21 days. Start Revlimid on day 1 of chemotherapy Auth #=[id number] date obtained 12/19/2022 Adult Male. (Patient not taking: Reported on 02/03/2023) 14 capsule 0   metoprolol tartrate (LOPRESSOR) 50 MG tablet Take 1 tablet (50 mg total) by mouth 2 (two) times daily. 60 tablet 2   Multiple Vitamins-Minerals (CENTRUM) tablet Take 1 tablet by mouth daily.     nitroGLYCERIN (NITROSTAT) 0.4 MG SL tablet PLACE 1 TABLET UNDER THE TONGUE IF NEEDED EVERY 5 MINUTES FOR CHEST PAIN FOR 3 DOSES IF NO RELIEF AFTER FIRST DOSE CALL 911. (Patient taking differently: Place 0.4 mg under the tongue every 5 (five) minutes as needed for chest pain.) 25 tablet 9   omeprazole (PRILOSEC) 20 MG capsule Take 20 mg by mouth daily before breakfast.     prochlorperazine (COMPAZINE) 10 MG tablet Take 1 tablet (10 mg total) by mouth every 6 (six) hours as needed for nausea or vomiting. 30 tablet 1   traMADol (ULTRAM) 50 MG tablet Take 1 tablet (50 mg total) by mouth every 12 (twelve) hours as needed. 30 tablet 0   triamcinolone ointment (KENALOG) 0.5 % Apply 1 Application topically 2 (two) times daily. (Patient taking differently: Apply 1 Application topically daily as needed (For left foot rash).) 30 g 0   warfarin (COUMADIN) 5 MG tablet TAKE 1/2 TO 1 TABLET BY MOUTH AS DIRECTED BY COUMADIN CLINIC. 30 tablet 1   No current facility-administered medications for this visit.    SURGICAL HISTORY:  Past Surgical History:  Procedure Laterality Date   BRONCHIAL BIOPSY  11/15/2022   Procedure: BRONCHIAL BIOPSIES;  Surgeon: [name], DO;  Location: [facility] ENDOSCOPY;  Service: Pulmonary;;   BRONCHIAL BRUSHINGS  11/15/2022   Procedure: BRONCHIAL BRUSHINGS;  Surgeon: [name], DO;  Location: [facility] ENDOSCOPY;  Service: Pulmonary;;   BRONCHIAL NEEDLE ASPIRATION BIOPSY  11/15/2022   Procedure: BRONCHIAL NEEDLE ASPIRATION BIOPSIES;  Surgeon: [name], DO;  Location: [facility] ENDOSCOPY;  Service: Pulmonary;;   CARDIAC  CATHETERIZATION N/A 07/23/2015   Procedure: Left Heart Cath and Coronary Angiography;  Surgeon: [name], MD;  Location[facility] INVASIVE CV LAB;  Service: Cardiovascular;  Laterality: N/A;   CARDIOVERSION N/A 06/03/2022   Procedure: CARDIOVERSION;  Surgeon: [name], MD;  Location: [facility] ENDOSCOPY;  Service: Cardiovascular;  Laterality: N/A;   COLONOSCOPY WITH PROPOFOL N/A 11/24/2015   Procedure: COLONOSCOPY WITH PROPOFOL;  Surgeon: [name], MD;  Location: [facility] ENDOSCOPY;  Service: Endoscopy;  Laterality: N/A;   esophageal strictures with dilatations in the past per Dr. [name]     last '98   FIDUCIAL MARKER PLACEMENT  11/15/2022   Procedure: FIDUCIAL MARKER PLACEMENT;  Surgeon: [name], DO;  Location: [facility] ENDOSCOPY;  Service: Pulmonary;;   HERNIA REPAIR Left    INTRAMEDULLARY (IM) NAIL INTERTROCHANTERIC Left 10/25/2022   Procedure: LEFT INTRAMEDULLARY (IM) NAIL INTERTROCHANTERIC;  Surgeon: [name], MD;  Location: [facility] OR;  Service: Orthopedics;  Laterality: Left;   IR BONE MARROW BIOPSY & ASPIRATION  11/17/2022   IR BONE TUMOR(S)RF ABLATION  10/11/2022   IR KYPHO THORACIC WITH BONE BIOPSY  10/11/2022   LAD stent placement - Dr. [name]     left inguinal hernia repair - Dr. [name]     prostatic radioactive seed implantation - Dr. [name]     '09    REVIEW OF SYSTEMS:  Constitutional: positive for fatigue Eyes: negative Ears, nose, mouth, throat, and face: negative Respiratory: negative Cardiovascular: negative Gastrointestinal: negative Genitourinary:negative Integument/breast: negative Hematologic/lymphatic: negative Musculoskeletal:positive for arthralgias and back pain Neurological: negative Behavioral/Psych: negative Endocrine: negative Allergic/Immunologic: negative   PHYSICAL EXAMINATION: General appearance: alert, cooperative, fatigued, and no distress Head: Normocephalic, without obvious abnormality, atraumatic Neck: no adenopathy, no JVD,  supple, symmetrical, trachea midline, and thyroid not enlarged, symmetric, no tenderness/mass/nodules Lymph nodes: Cervical, supraclavicular, and axillary nodes normal. Resp: clear to auscultation bilaterally Back: symmetric, no curvature. ROM normal. No CVA tenderness. Cardio: regular rate and rhythm, S1, S2 normal, no murmur, click, rub or gallop GI: soft, non-tender; bowel sounds normal; no masses,  no organomegaly Extremities: extremities normal, atraumatic, no cyanosis or edema Neurologic: Alert and oriented X 3, normal strength and tone. Normal symmetric reflexes. Normal coordination and gait  ECOG PERFORMANCE STATUS: 1 - Symptomatic but completely ambulatory  Blood pressure (!) 127/94, pulse 88, temperature 97.7 F (36.5 C), temperature source Oral, resp. rate 18, height 6' (1.829 m), weight 195 lb 9.6 oz (88.7 kg), SpO2 97%.  LABORATORY DATA: Lab Results  Component Value Date   WBC 10.3 03/27/2023   HGB 14.1 03/27/2023   HCT 42.1 03/27/2023   MCV 102.7 (H) 03/27/2023   PLT 126 (L) 03/27/2023      Chemistry      Component Value Date/Time   NA 139 03/20/2023 0956   NA 141 08/25/2022 1440   K 3.9 03/20/2023 0956   CL 107 03/20/2023 0956   CO2 26 03/20/2023 0956   BUN 15 03/20/2023 0956   BUN 14 08/25/2022 1440   CREATININE 0.93 03/20/2023 0956   CREATININE 1.23 (H) 07/21/2015 0917      Component Value Date/Time   CALCIUM 8.5 (L) 03/20/2023 0956   ALKPHOS 64 03/20/2023 0956   AST 20 03/20/2023 0956   ALT 20 03/20/2023 0956   BILITOT 1.6 (H) 03/20/2023 0956       RADIOGRAPHIC STUDIES: DG Lumbar Spine 2-3 Views  Result Date: 03/06/2023 CLINICAL DATA:  Low back pain, multiple myeloma EXAM: LUMBAR SPINE - 2-3 VIEW COMPARISON:  PET-CT 09/19/2022, CT abdomen/pelvis 10/20/2009 FINDINGS: There are 5 non-rib-bearing lumbar-type vertebral bodies. Vertebral body heights are preserved, without evidence of acute injury. There is levocurvature centered at L3. There is no  antero or retrolisthesis. There is mild-to-moderate disc space narrowing and facet arthropathy at L3-L4 through L5-S1. The SI joints and symphysis pubis are intact. Postsurgical changes are noted in the proximal left femur. IMPRESSION: 1. No acute finding in the lumbar spine. Note that if there is concern for metastatic disease, cross-sectional imaging is recommended. 2. Levocurvature centered at L3 and mild-to-moderate degenerative changes at L3-L4 through L5-S1. Electronically Signed   By: Lesia Hausen M.D.   On: 03/06/2023 11:02    ASSESSMENT AND PLAN: This is a  very pleasant 82 years old white male with multiple myeloma presented with multiple lytic lesions involving T10, left femoral neck as well as T4, T9 and T11 vertebral bodies. Also had right lower lobe lung nodule that was suspicious for non-small cell lung cancer. 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 The biopsy from the left hip was negative for malignancy. His myeloma panel showed no concerning findings except for elevated IgG but the serum light chains were normal. The patient underwent left hip fixation for impending fracture. He also underwent palliative radiotherapy under the care of Dr. Kathrynn Running. His recent bone marrow biopsy and aspirate was consistent with plasma cell neoplasm with involvement of plasma cells 25% of the bone marrow. The patient is currently on treatment with daratumumab, Velcade, Revlimid and dexamethasone started on 12/05/2022 status post 5 cycles.  He has been off treatment with Revlimid starting from cycle #3 because of the significant pancytopenia The patient has been tolerating this treatment fairly well with no concerning adverse effects. I recommended for him to proceed with cycle #6 today. For the swelling and tenderness of the left lower extremity, I will order Doppler of the lower extremity  to rule out deep venous thrombosis. For the suspicious right lower lobe lung cancer, I underwent SBRT under the care of Dr. Kathrynn Running. For anticoagulation, he is currently on Coumadin. I will see the patient back for follow-up visit in 4 weeks for evaluation before starting cycle #7. He was advised to call immediately if he has any other concerning symptoms in the interval. The patient voices understanding of current disease status and treatment options and is in agreement with the current care plan.  All questions were answered. The patient knows to call the clinic with any problems, questions or concerns. We can certainly see the patient much sooner if necessary.  The total time spent in the appointment was 30 minutes.  Disclaimer: This note was dictated with voice recognition software. Similar sounding words can inadvertently be transcribed and may not be corrected upon review.

## 2023-03-27 NOTE — Patient Instructions (Signed)
[facility]  Discharge Instructions: Thank you for choosing [facility] to provide your oncology and hematology care.   If you have a lab appointment with the Cancer Center, please go directly to the Cancer Center and check in at the registration area.   Wear comfortable clothing and clothing appropriate for easy access to any Portacath or PICC line.   We strive to give you quality time with your provider. You may need to reschedule your appointment if you arrive late (15 or more minutes).  Arriving late affects you and other patients whose appointments are after yours.  Also, if you miss three or more appointments without notifying the office, you may be dismissed from the clinic at the provider's discretion.      For prescription refill requests, have your pharmacy contact our office and allow 72 hours for refills to be completed.    Today you received the following chemotherapy and/or immunotherapy agents: Darzalex Faspro/Velcade      To help prevent nausea and vomiting after your treatment, we encourage you to take your nausea medication as directed.  BELOW ARE SYMPTOMS THAT SHOULD BE REPORTED IMMEDIATELY: *FEVER GREATER THAN 100.4 F (38 C) OR HIGHER *CHILLS OR SWEATING *NAUSEA AND VOMITING THAT IS NOT CONTROLLED WITH YOUR NAUSEA MEDICATION *UNUSUAL SHORTNESS OF BREATH *UNUSUAL BRUISING OR BLEEDING *URINARY PROBLEMS (pain or burning when urinating, or frequent urination) *BOWEL PROBLEMS (unusual diarrhea, constipation, pain near the anus) TENDERNESS IN MOUTH AND THROAT WITH OR WITHOUT PRESENCE OF ULCERS (sore throat, sores in mouth, or a toothache) UNUSUAL RASH, SWELLING OR PAIN  UNUSUAL VAGINAL DISCHARGE OR ITCHING   Items with * indicate a potential emergency and should be followed up as soon as possible or go to the Emergency Department if any problems should occur.  Please show the CHEMOTHERAPY ALERT CARD or IMMUNOTHERAPY ALERT  CARD at check-in to the Emergency Department and triage nurse.  Should you have questions after your visit or need to cancel or reschedule your appointment, please contact [place] [facility]  Dept: [phone]  and follow the prompts.  Office hours are 8:00 a.m. to 4:30 p.m. Monday - Friday. Please note that voicemails left after 4:00 p.m. may not be returned until the following business day.  We are closed weekends and major holidays. You have access to a nurse at all times for urgent questions. Please call the main number to the clinic Dept: [phone] and follow the prompts.   For any non-urgent questions, you may also contact your provider using MyChart. We now offer e-Visits for anyone 45 and older to request care online for non-urgent symptoms. For details visit [email or web address].   Also download the MyChart app! Go to the app store, search "MyChart", open the app, select [place], and log in with your MyChart username and password.

## 2023-03-27 NOTE — Progress Notes (Addendum)
Patient seen by Dr. Gypsy Balsam are within treatment parameters.  Labs reviewed: and are not all within treatment parameters.   Bilirubin =1.6. Per Dr. Arbutus Ped , it is ok to treat pt today with Velcade and Daratumumab and bilirubin =1.6.  Per physician team, patient is ready for treatment and there are NO modifications to the treatment plan.

## 2023-03-27 NOTE — Telephone Encounter (Signed)
LVM to return my call to schedule test to r/o DVT.

## 2023-03-28 ENCOUNTER — Ambulatory Visit (HOSPITAL_COMMUNITY)
Admission: RE | Admit: 2023-03-28 | Discharge: 2023-03-28 | Disposition: A | Discharge status: Home / Self Care | Payer: Medicare Other | Source: Ambulatory Visit | Attending: Internal Medicine | Admitting: Internal Medicine

## 2023-03-28 DIAGNOSIS — R609 Edema, unspecified: Secondary | ICD-10-CM | POA: Insufficient documentation

## 2023-03-28 NOTE — Progress Notes (Signed)
Left lower extremity venous duplex has been completed. Preliminary results can be found in CV Proc through chart review.  Results were given to Dr. Arbutus Ped.  03/28/23 1:45 PM Olen Cordial RVT

## 2023-03-30 ENCOUNTER — Inpatient Hospital Stay: Payer: Medicare Other

## 2023-03-30 VITALS — BP 112/79 | HR 69 | Temp 97.5°F | Resp 16

## 2023-03-30 DIAGNOSIS — Z5112 Encounter for antineoplastic immunotherapy: Secondary | ICD-10-CM | POA: Diagnosis not present

## 2023-03-30 DIAGNOSIS — C9000 Multiple myeloma not having achieved remission: Secondary | ICD-10-CM

## 2023-03-30 MED ORDER — PROCHLORPERAZINE MALEATE 10 MG PO TABS
10.0000 mg | ORAL_TABLET | Freq: Once | ORAL | Status: AC
  Administered 2023-03-30: 10 mg via ORAL
  Filled 2023-03-30: qty 1

## 2023-03-30 MED ORDER — BORTEZOMIB CHEMO SQ INJECTION 3.5 MG (2.5MG/ML)
1.3000 mg/m2 | Freq: Once | INTRAMUSCULAR | Status: AC
  Administered 2023-03-30: 2.75 mg via SUBCUTANEOUS
  Filled 2023-03-30: qty 1.1

## 2023-03-30 NOTE — Patient Instructions (Signed)
[facility]  Discharge Instructions: Thank you for choosing [facility] to provide your oncology and hematology care.   If you have a lab appointment with the Cancer Center, please go directly to the Cancer Center and check in at the registration area.   Wear comfortable clothing and clothing appropriate for easy access to any Portacath or PICC line.   We strive to give you quality time with your provider. You may need to reschedule your appointment if you arrive late (15 or more minutes).  Arriving late affects you and other patients whose appointments are after yours.  Also, if you miss three or more appointments without notifying the office, you may be dismissed from the clinic at the provider's discretion.      For prescription refill requests, have your pharmacy contact our office and allow 72 hours for refills to be completed.    Today you received the following chemotherapy and/or immunotherapy agents velcade      To help prevent nausea and vomiting after your treatment, we encourage you to take your nausea medication as directed.  BELOW ARE SYMPTOMS THAT SHOULD BE REPORTED IMMEDIATELY: *FEVER GREATER THAN 100.4 F (38 C) OR HIGHER *CHILLS OR SWEATING *NAUSEA AND VOMITING THAT IS NOT CONTROLLED WITH YOUR NAUSEA MEDICATION *UNUSUAL SHORTNESS OF BREATH *UNUSUAL BRUISING OR BLEEDING *URINARY PROBLEMS (pain or burning when urinating, or frequent urination) *BOWEL PROBLEMS (unusual diarrhea, constipation, pain near the anus) TENDERNESS IN MOUTH AND THROAT WITH OR WITHOUT PRESENCE OF ULCERS (sore throat, sores in mouth, or a toothache) UNUSUAL RASH, SWELLING OR PAIN  UNUSUAL VAGINAL DISCHARGE OR ITCHING   Items with * indicate a potential emergency and should be followed up as soon as possible or go to the Emergency Department if any problems should occur.  Please show the CHEMOTHERAPY ALERT CARD or IMMUNOTHERAPY ALERT CARD at check-in  to the Emergency Department and triage nurse.  Should you have questions after your visit or need to cancel or reschedule your appointment, please contact [facility]  Dept: [phone]  and follow the prompts.  Office hours are 8:00 a.m. to 4:30 p.m. Monday - Friday. Please note that voicemails left after 4:00 p.m. may not be returned until the following business day.  We are closed weekends and major holidays. You have access to a nurse at all times for urgent questions. Please call the main number to the clinic Dept: [phone] and follow the prompts.   For any non-urgent questions, you may also contact your provider using MyChart. We now offer e-Visits for anyone 18 and older to request care online for non-urgent symptoms. For details visit [email or web address].   Also download the MyChart app! Go to the app store, search "MyChart", open the app, select [facility], and log in with your MyChart username and password.   

## 2023-04-01 ENCOUNTER — Encounter (HOSPITAL_COMMUNITY): Payer: Self-pay

## 2023-04-01 ENCOUNTER — Observation Stay (HOSPITAL_COMMUNITY)
Admission: EM | Admit: 2023-04-01 | Discharge: 2023-04-03 | Disposition: A | Discharge status: Home / Self Care | Payer: Medicare Other | Attending: Internal Medicine | Admitting: Internal Medicine

## 2023-04-01 ENCOUNTER — Other Ambulatory Visit: Payer: Self-pay

## 2023-04-01 ENCOUNTER — Emergency Department (HOSPITAL_COMMUNITY): Payer: Medicare Other

## 2023-04-01 DIAGNOSIS — I4589 Other specified conduction disorders: Secondary | ICD-10-CM | POA: Insufficient documentation

## 2023-04-01 DIAGNOSIS — Z1152 Encounter for screening for COVID-19: Secondary | ICD-10-CM | POA: Insufficient documentation

## 2023-04-01 DIAGNOSIS — K635 Polyp of colon: Secondary | ICD-10-CM | POA: Insufficient documentation

## 2023-04-01 DIAGNOSIS — I10 Essential (primary) hypertension: Secondary | ICD-10-CM | POA: Diagnosis present

## 2023-04-01 DIAGNOSIS — R197 Diarrhea, unspecified: Secondary | ICD-10-CM | POA: Diagnosis present

## 2023-04-01 DIAGNOSIS — K529 Noninfective gastroenteritis and colitis, unspecified: Secondary | ICD-10-CM | POA: Diagnosis present

## 2023-04-01 DIAGNOSIS — I5022 Chronic systolic (congestive) heart failure: Secondary | ICD-10-CM | POA: Insufficient documentation

## 2023-04-01 DIAGNOSIS — R9431 Abnormal electrocardiogram [ECG] [EKG]: Secondary | ICD-10-CM | POA: Diagnosis present

## 2023-04-01 DIAGNOSIS — E871 Hypo-osmolality and hyponatremia: Secondary | ICD-10-CM | POA: Insufficient documentation

## 2023-04-01 DIAGNOSIS — E785 Hyperlipidemia, unspecified: Secondary | ICD-10-CM | POA: Diagnosis present

## 2023-04-01 DIAGNOSIS — C9000 Multiple myeloma not having achieved remission: Secondary | ICD-10-CM | POA: Insufficient documentation

## 2023-04-01 DIAGNOSIS — Z7902 Long term (current) use of antithrombotics/antiplatelets: Secondary | ICD-10-CM | POA: Insufficient documentation

## 2023-04-01 DIAGNOSIS — M6281 Muscle weakness (generalized): Secondary | ICD-10-CM | POA: Diagnosis not present

## 2023-04-01 DIAGNOSIS — I4891 Unspecified atrial fibrillation: Secondary | ICD-10-CM | POA: Diagnosis present

## 2023-04-01 DIAGNOSIS — K449 Diaphragmatic hernia without obstruction or gangrene: Secondary | ICD-10-CM

## 2023-04-01 DIAGNOSIS — Z8616 Personal history of COVID-19: Secondary | ICD-10-CM | POA: Insufficient documentation

## 2023-04-01 DIAGNOSIS — E440 Moderate protein-calorie malnutrition: Secondary | ICD-10-CM | POA: Insufficient documentation

## 2023-04-01 DIAGNOSIS — C61 Malignant neoplasm of prostate: Secondary | ICD-10-CM | POA: Diagnosis not present

## 2023-04-01 DIAGNOSIS — E876 Hypokalemia: Secondary | ICD-10-CM | POA: Diagnosis not present

## 2023-04-01 DIAGNOSIS — E860 Dehydration: Secondary | ICD-10-CM

## 2023-04-01 DIAGNOSIS — E43 Unspecified severe protein-calorie malnutrition: Secondary | ICD-10-CM | POA: Diagnosis present

## 2023-04-01 DIAGNOSIS — Z79899 Other long term (current) drug therapy: Secondary | ICD-10-CM | POA: Insufficient documentation

## 2023-04-01 DIAGNOSIS — R2681 Unsteadiness on feet: Secondary | ICD-10-CM | POA: Diagnosis not present

## 2023-04-01 DIAGNOSIS — I2510 Atherosclerotic heart disease of native coronary artery without angina pectoris: Secondary | ICD-10-CM | POA: Insufficient documentation

## 2023-04-01 DIAGNOSIS — D696 Thrombocytopenia, unspecified: Secondary | ICD-10-CM | POA: Insufficient documentation

## 2023-04-01 DIAGNOSIS — J309 Allergic rhinitis, unspecified: Secondary | ICD-10-CM | POA: Insufficient documentation

## 2023-04-01 DIAGNOSIS — K222 Esophageal obstruction: Secondary | ICD-10-CM

## 2023-04-01 LAB — URINALYSIS, ROUTINE W REFLEX MICROSCOPIC
Bilirubin Urine: NEGATIVE
Glucose, UA: NEGATIVE mg/dL
Hgb urine dipstick: NEGATIVE
Ketones, ur: 5 mg/dL — AB
Leukocytes,Ua: NEGATIVE
Nitrite: NEGATIVE
Protein, ur: NEGATIVE mg/dL
Specific Gravity, Urine: 1.013 (ref 1.005–1.030)
pH: 5 (ref 5.0–8.0)

## 2023-04-01 LAB — PROTIME-INR
INR: 2.6 — ABNORMAL HIGH (ref 0.8–1.2)
Prothrombin Time: 27.9 seconds — ABNORMAL HIGH (ref 11.4–15.2)

## 2023-04-01 LAB — CBC WITH DIFFERENTIAL/PLATELET
Abs Immature Granulocytes: 0.13 10*3/uL — ABNORMAL HIGH (ref 0.00–0.07)
Basophils Absolute: 0 10*3/uL (ref 0.0–0.1)
Basophils Relative: 0 %
Eosinophils Absolute: 0 10*3/uL (ref 0.0–0.5)
Eosinophils Relative: 1 %
HCT: 45.7 % (ref 39.0–52.0)
Hemoglobin: 15.2 g/dL (ref 13.0–17.0)
Immature Granulocytes: 2 %
Lymphocytes Relative: 5 %
Lymphs Abs: 0.4 10*3/uL — ABNORMAL LOW (ref 0.7–4.0)
MCH: 34.3 pg — ABNORMAL HIGH (ref 26.0–34.0)
MCHC: 33.3 g/dL (ref 30.0–36.0)
MCV: 103.2 fL — ABNORMAL HIGH (ref 80.0–100.0)
Monocytes Absolute: 0.8 10*3/uL (ref 0.1–1.0)
Monocytes Relative: 11 %
Neutro Abs: 6.3 10*3/uL (ref 1.7–7.7)
Neutrophils Relative %: 81 %
Platelets: 55 10*3/uL — ABNORMAL LOW (ref 150–400)
RBC: 4.43 MIL/uL (ref 4.22–5.81)
RDW: 16.1 % — ABNORMAL HIGH (ref 11.5–15.5)
WBC: 7.7 10*3/uL (ref 4.0–10.5)
nRBC: 0 % (ref 0.0–0.2)

## 2023-04-01 LAB — LIPASE, BLOOD: Lipase: 29 U/L (ref 11–51)

## 2023-04-01 LAB — COMPREHENSIVE METABOLIC PANEL
ALT: 25 U/L (ref 0–44)
AST: 19 U/L (ref 15–41)
Albumin: 2.4 g/dL — ABNORMAL LOW (ref 3.5–5.0)
Alkaline Phosphatase: 54 U/L (ref 38–126)
Anion gap: 6 (ref 5–15)
BUN: 20 mg/dL (ref 8–23)
CO2: 17 mmol/L — ABNORMAL LOW (ref 22–32)
Calcium: 7.1 mg/dL — ABNORMAL LOW (ref 8.9–10.3)
Chloride: 108 mmol/L (ref 98–111)
Creatinine, Ser: 0.74 mg/dL (ref 0.61–1.24)
GFR, Estimated: >60 mL/min (ref >60)
Glucose, Bld: 103 mg/dL — ABNORMAL HIGH (ref 70–99)
Potassium: 2.5 mmol/L — CL (ref 3.5–5.1)
Sodium: 131 mmol/L — ABNORMAL LOW (ref 135–145)
Total Bilirubin: 1 mg/dL (ref 0.3–1.2)
Total Protein: 4.2 g/dL — ABNORMAL LOW (ref 6.5–8.1)

## 2023-04-01 LAB — SARS CORONAVIRUS 2 BY RT PCR: SARS Coronavirus 2 by RT PCR: NEGATIVE

## 2023-04-01 LAB — MAGNESIUM: Magnesium: 1.8 mg/dL (ref 1.7–2.4)

## 2023-04-01 LAB — C DIFFICILE QUICK SCREEN W PCR REFLEX
C Diff antigen: NEGATIVE
C Diff interpretation: NOT DETECTED
C Diff toxin: NEGATIVE

## 2023-04-01 MED ORDER — POTASSIUM CHLORIDE 10 MEQ/100ML IV SOLN
10.0000 meq | INTRAVENOUS | Status: AC
  Administered 2023-04-01 (×3): 10 meq via INTRAVENOUS
  Filled 2023-04-01 (×3): qty 100

## 2023-04-01 MED ORDER — SODIUM CHLORIDE 0.9 % IV BOLUS
1000.0000 mL | Freq: Once | INTRAVENOUS | Status: AC
  Administered 2023-04-01: 1000 mL via INTRAVENOUS

## 2023-04-01 MED ORDER — LACTATED RINGERS IV BOLUS
500.0000 mL | Freq: Once | INTRAVENOUS | Status: AC
  Administered 2023-04-01: 500 mL via INTRAVENOUS

## 2023-04-01 MED ORDER — ACETAMINOPHEN 325 MG PO TABS
650.0000 mg | ORAL_TABLET | Freq: Four times a day (QID) | ORAL | Status: DC | PRN
  Administered 2023-04-01 – 2023-04-02 (×2): 650 mg via ORAL
  Filled 2023-04-01 (×2): qty 2

## 2023-04-01 MED ORDER — PANTOPRAZOLE SODIUM 40 MG PO TBEC
40.0000 mg | DELAYED_RELEASE_TABLET | Freq: Every day | ORAL | Status: DC
  Administered 2023-04-01 – 2023-04-03 (×3): 40 mg via ORAL
  Filled 2023-04-01 (×3): qty 1

## 2023-04-01 MED ORDER — NITROGLYCERIN 0.4 MG SL SUBL: 0.4000 mg | SUBLINGUAL_TABLET | SUBLINGUAL | Status: DC | PRN

## 2023-04-01 MED ORDER — POTASSIUM CHLORIDE IN NACL 40-0.9 MEQ/L-% IV SOLN
INTRAVENOUS | Status: AC
  Filled 2023-04-01: qty 1000

## 2023-04-01 MED ORDER — ONDANSETRON HCL 4 MG/2ML IJ SOLN
4.0000 mg | Freq: Once | INTRAMUSCULAR | Status: AC
  Administered 2023-04-01: 4 mg via INTRAVENOUS
  Filled 2023-04-01: qty 2

## 2023-04-01 MED ORDER — METOPROLOL TARTRATE 50 MG PO TABS
50.0000 mg | ORAL_TABLET | Freq: Two times a day (BID) | ORAL | Status: DC
  Administered 2023-04-01 – 2023-04-03 (×4): 50 mg via ORAL
  Filled 2023-04-01 (×4): qty 1

## 2023-04-01 MED ORDER — ATORVASTATIN CALCIUM 40 MG PO TABS
40.0000 mg | ORAL_TABLET | Freq: Every day | ORAL | Status: DC
  Administered 2023-04-01 – 2023-04-02 (×2): 40 mg via ORAL
  Filled 2023-04-01 (×2): qty 1

## 2023-04-01 MED ORDER — PROCHLORPERAZINE EDISYLATE 10 MG/2ML IJ SOLN: 10.0000 mg | Freq: Four times a day (QID) | INTRAMUSCULAR | Status: DC | PRN

## 2023-04-01 MED ORDER — WARFARIN - PHARMACIST DOSING INPATIENT: Freq: Every day | Status: DC

## 2023-04-01 MED ORDER — ACYCLOVIR 200 MG PO CAPS
200.0000 mg | ORAL_CAPSULE | Freq: Two times a day (BID) | ORAL | Status: DC
  Administered 2023-04-01 – 2023-04-03 (×4): 200 mg via ORAL
  Filled 2023-04-01 (×4): qty 1

## 2023-04-01 MED ORDER — WARFARIN SODIUM 2.5 MG PO TABS
2.5000 mg | ORAL_TABLET | Freq: Once | ORAL | Status: AC
  Administered 2023-04-01: 2.5 mg via ORAL
  Filled 2023-04-01: qty 1

## 2023-04-01 MED ORDER — POTASSIUM CHLORIDE CRYS ER 20 MEQ PO TBCR
40.0000 meq | EXTENDED_RELEASE_TABLET | Freq: Once | ORAL | Status: AC
  Administered 2023-04-01: 40 meq via ORAL
  Filled 2023-04-01: qty 2

## 2023-04-01 MED ORDER — MORPHINE SULFATE (PF) 4 MG/ML IV SOLN: 4.0000 mg | Freq: Once | INTRAVENOUS | Status: DC

## 2023-04-01 MED ORDER — ACETAMINOPHEN 650 MG RE SUPP: 650.0000 mg | Freq: Four times a day (QID) | RECTAL | Status: DC | PRN

## 2023-04-01 MED ORDER — MAGNESIUM SULFATE 2 GM/50ML IV SOLN
2.0000 g | Freq: Once | INTRAVENOUS | Status: AC
  Administered 2023-04-01: 2 g via INTRAVENOUS
  Filled 2023-04-01: qty 50

## 2023-04-01 MED ORDER — IOHEXOL 300 MG/ML  SOLN
100.0000 mL | Freq: Once | INTRAMUSCULAR | Status: AC | PRN
  Administered 2023-04-01: 100 mL via INTRAVENOUS

## 2023-04-01 NOTE — ED Notes (Signed)
ED TO INPATIENT HANDOFF REPORT  ED Nurse Name and Phone #:  Victorino Dike [phone]  S Name/Age/Gender Ronald Duran 82 y.o. male Room/Bed: WA17/WA17  Code Status   Code Status: Prior  Home/SNF/Other Patient oriented to: self, place, time, and situation Is this baseline? Yes   Triage Complete: Triage complete  Chief Complaint Hypokalemia [E87.6]  Triage Note Pt coming in today complaining of diarrhea since 1200 yesterday. Pt states that the diarrhea has progressed to almost a clear/yellow liquid. States he has to go about every half hour. Denies any fevers or related pain.    Allergies No Known Allergies  Level of Care/Admitting Diagnosis ED Disposition     ED Disposition  Admit   Condition  --   Comment  Hospital Area: [facility] [[id number]]  Level of Care: Telemetry [5]  Admit to tele based on following criteria: Monitor for Ischemic changes  Admit to tele based on following criteria: Monitor QTC interval  May place patient in observation at [facility] or [facility] if equivalent level of care is available:: No  Covid Evaluation: Asymptomatic - no recent exposure (last 10 days) testing not required  Diagnosis: Hypokalemia [172180]  Admitting Physician: Bobette Mo [[id number]]  Attending Physician: Bobette Mo [[id number]]          B Medical/Surgery History Past Medical History:  Diagnosis Date   Atrial fibrillation (HCC)    CAD (coronary artery disease)    a. s/p MI with LAD stent placement December, 1999 -last stress test November 2008 with no EKG evidence of ischemia  b. cath 07/23/2015 after abnormal ETT and nuc, occluded mid LAD stent with good distal collateral, medical therapy    Cardiomyopathy (HCC)    Ischemic cardiomyopathy with EF 40% by echo 2005   COVID 2021   mild case   Dyslipidemia    Erectile dysfunction    GERD (gastroesophageal reflux disease)    Hiatal hernia    with esophageal strictures and dilatations  in the past - Dr. [name]   History of nuclear stress test    Myoview 12/16: EF 47%, anterior, anteroseptal and inferoseptal defect suggesting infarct with peri-infarct ischemia, intermediate risk   HTN (hypertension)    Hx of seasonal allergies    Hyperlipidemia    Myocardial infarction (HCC) 08/01/1998   PAD (peripheral artery disease) (HCC)    Pre-diabetes    Prostate cancer (HCC)    with seed implant therapy following Lupron therapy in 2008 and 2009 - followed by Dr. Pollyann Glen    Stroke (HCC) 04/20/2017   seen on a MRI   Vitamin D deficiency    Past Surgical History:  Procedure Laterality Date   BRONCHIAL BIOPSY  11/15/2022   Procedure: BRONCHIAL BIOPSIES;  Surgeon: [name], DO;  Location: [facility] ENDOSCOPY;  Service: Pulmonary;;   BRONCHIAL BRUSHINGS  11/15/2022   Procedure: BRONCHIAL BRUSHINGS;  Surgeon: [name], DO;  Location: [facility] ENDOSCOPY;  Service: Pulmonary;;   BRONCHIAL NEEDLE ASPIRATION BIOPSY  11/15/2022   Procedure: BRONCHIAL NEEDLE ASPIRATION BIOPSIES;  Surgeon: [name], DO;  Location: [facility] ENDOSCOPY;  Service: Pulmonary;;   CARDIAC CATHETERIZATION N/A 07/23/2015   Procedure: Left Heart Cath and Coronary Angiography;  Surgeon: [name], MD;  Location: [facility] INVASIVE CV LAB;  Service: Cardiovascular;  Laterality: N/A;   CARDIOVERSION N/A 06/03/2022   Procedure: CARDIOVERSION;  Surgeon: [name], MD;  Location: [facility] ENDOSCOPY;  Service: Cardiovascular;  Laterality: N/A;  COLONOSCOPY WITH PROPOFOL N/A 11/24/2015   Procedure: COLONOSCOPY WITH PROPOFOL;  Surgeon: [name], MD;  Location: [facility] ENDOSCOPY;  Service: Endoscopy;  Laterality: N/A;   esophageal strictures with dilatations in the past per Dr. [name]     last '98   FIDUCIAL MARKER PLACEMENT  11/15/2022   Procedure: FIDUCIAL MARKER PLACEMENT;  Surgeon: [name], DO;  Location: [facility] ENDOSCOPY;  Service: Pulmonary;;   HERNIA REPAIR Left    INTRAMEDULLARY (IM) NAIL  INTERTROCHANTERIC Left 10/25/2022   Procedure: LEFT INTRAMEDULLARY (IM) NAIL INTERTROCHANTERIC;  Surgeon: [name], MD;  Location: [facility] OR;  Service: Orthopedics;  Laterality: Left;   IR BONE MARROW BIOPSY & ASPIRATION  11/17/2022   IR BONE TUMOR(S)RF ABLATION  10/11/2022   IR KYPHO THORACIC WITH BONE BIOPSY  10/11/2022   LAD stent placement - Dr. [name]     left inguinal hernia repair - Dr. [name]     prostatic radioactive seed implantation - Dr. [name]     '09     A IV Location/Drains/Wounds Patient Lines/Drains/Airways Status     Active Line/Drains/Airways     Name Placement date Placement time Site Days   Peripheral IV 04/01/23 20 G Anterior;Right Forearm 04/01/23  0807  Forearm  less than 1   Wound / Incision (Open or Dehisced) 10/11/22 Puncture Vertebral column Posterior Kyphoplasty T10 10/11/22  1000  Vertebral column  172            Intake/Output Last 24 hours  Intake/Output Summary (Last 24 hours) at 04/01/2023 1338 Last data filed at 04/01/2023 1128 Gross per 24 hour  Intake 550 ml  Output --  Net 550 ml    Labs/Imaging Results for orders placed or performed during the hospital encounter of 04/01/23 (from the past 48 hour(s))  CBC with Differential     Status: Abnormal   Collection Time: 04/01/23  8:05 AM  Result Value Ref Range   WBC 7.7 4.0 - 10.5 K/uL   RBC 4.43 4.22 - 5.81 MIL/uL   Hemoglobin 15.2 13.0 - 17.0 g/dL   HCT 16.1 09.6 - 04.5 %   MCV 103.2 (H) 80.0 - 100.0 fL   MCH 34.3 (H) 26.0 - 34.0 pg   MCHC 33.3 30.0 - 36.0 g/dL   RDW 40.9 (H) 81.1 - 91.4 %   Platelets 55 (L) 150 - 400 K/uL    Comment: SPECIMEN CHECKED FOR CLOTS Immature Platelet Fraction may be clinically indicated, consider ordering this additional test [id number] REPEATED TO VERIFY PLATELET COUNT CONFIRMED BY SMEAR    nRBC 0.0 0.0 - 0.2 %   Neutrophils Relative % 81 %   Neutro Abs 6.3 1.7 - 7.7 K/uL   Lymphocytes Relative 5 %   Lymphs Abs 0.4 (L) 0.7 - 4.0 K/uL    Monocytes Relative 11 %   Monocytes Absolute 0.8 0.1 - 1.0 K/uL   Eosinophils Relative 1 %   Eosinophils Absolute 0.0 0.0 - 0.5 K/uL   Basophils Relative 0 %   Basophils Absolute 0.0 0.0 - 0.1 K/uL   Immature Granulocytes 2 %   Abs Immature Granulocytes 0.13 (H) 0.00 - 0.07 K/uL    Comment: Performed at [facility], [address] [place], [place] [address]  Comprehensive metabolic panel     Status: Abnormal   Collection Time: 04/01/23  8:46 AM  Result Value Ref Range   Sodium 131 (L) 135 - 145 mmol/L   Potassium 2.5 (LL) 3.5 - 5.1 mmol/L  Comment: CRITICAL RESULT CALLED TO, READ BACK BY AND VERIFIED WITH SAVIOE, B RN @ [phone] ON 04/01/23 BY Deedra Ehrich, K    Chloride 108 98 - 111 mmol/L   CO2 17 (L) 22 - 32 mmol/L   Glucose, Bld 103 (H) 70 - 99 mg/dL    Comment: Glucose reference range applies only to samples taken after fasting for at least 8 hours.   BUN 20 8 - 23 mg/dL   Creatinine, Ser 4.13 0.61 - 1.24 mg/dL   Calcium 7.1 (L) 8.9 - 10.3 mg/dL   Total Protein 4.2 (L) 6.5 - 8.1 g/dL   Albumin 2.4 (L) 3.5 - 5.0 g/dL   AST 19 15 - 41 U/L   ALT 25 0 - 44 U/L   Alkaline Phosphatase 54 38 - 126 U/L   Total Bilirubin 1.0 0.3 - 1.2 mg/dL   GFR, Estimated >24 >40 mL/min    Comment: (NOTE) Calculated using the CKD-EPI Creatinine Equation (2021)    Anion gap 6 5 - 15    Comment: Performed at [facility], [address], [place], [place] [address]  Lipase, blood     Status: None   Collection Time: 04/01/23  8:46 AM  Result Value Ref Range   Lipase 29 11 - 51 U/L    Comment: Performed at [facility], [address] [place], [place] [address]  Magnesium     Status: None   Collection Time: 04/01/23  8:46 AM  Result Value Ref Range   Magnesium 1.8 1.7 - 2.4 mg/dL    Comment: Performed at [facility], [address] [place], [place] [address]  Urinalysis, Routine w reflex microscopic -Urine, Clean Catch      Status: Abnormal   Collection Time: 04/01/23  9:57 AM  Result Value Ref Range   Color, Urine AMBER (A) YELLOW    Comment: BIOCHEMICALS MAY BE AFFECTED BY COLOR   APPearance HAZY (A) CLEAR   Specific Gravity, Urine 1.013 1.005 - 1.030   pH 5.0 5.0 - 8.0   Glucose, UA NEGATIVE NEGATIVE mg/dL   Hgb urine dipstick NEGATIVE NEGATIVE   Bilirubin Urine NEGATIVE NEGATIVE   Ketones, ur 5 (A) NEGATIVE mg/dL   Protein, ur NEGATIVE NEGATIVE mg/dL   Nitrite NEGATIVE NEGATIVE   Leukocytes,Ua NEGATIVE NEGATIVE    Comment: Performed at [facility], [address], [place], [place] [address]  C Difficile Quick Screen w PCR reflex     Status: None   Collection Time: 04/01/23 11:10 AM   Specimen: STOOL  Result Value Ref Range   C Diff antigen NEGATIVE NEGATIVE   C Diff toxin NEGATIVE NEGATIVE   C Diff interpretation No C. difficile detected.     Comment: Performed at [facility], [address] [place], [place] [address]  SARS Coronavirus 2 by RT PCR (hospital order, performed in [facility] hospital lab) *cepheid single result test* Anterior Nasal Swab     Status: None   Collection Time: 04/01/23 11:31 AM   Specimen: Anterior Nasal Swab  Result Value Ref Range   SARS Coronavirus 2 by RT PCR NEGATIVE NEGATIVE    Comment: (NOTE) SARS-CoV-2 target nucleic acids are NOT DETECTED.  The SARS-CoV-2 RNA is generally detectable in upper and lower respiratory specimens during the acute phase of infection. The lowest concentration of SARS-CoV-2 viral copies this assay can detect is 250 copies / mL. A negative result does not preclude SARS-CoV-2 infection and should not  be used as the sole basis for treatment or other patient management decisions.  A negative result may occur with improper specimen collection / handling, submission of specimen other than nasopharyngeal swab, presence of viral mutation(s) within the areas targeted by this assay, and inadequate number  of viral copies (<250 copies / mL). A negative result must be combined with clinical observations, patient history, and epidemiological information.  Fact Sheet for Patients:   RoadLapTop.co.za  Fact Sheet for Healthcare Providers: http://kim-miller.com/  This test is not yet approved or  cleared by the [place] FDA and has been authorized for detection and/or diagnosis of SARS-CoV-2 by FDA under an Emergency Use Authorization (EUA).  This EUA will remain in effect (meaning this test can be used) for the duration of the COVID-19 declaration under Section 564(b)(1) of the Act, 21 U.S.C. section 360bbb-3(b)(1), unless the authorization is terminated or revoked sooner.  Performed at [facility], [address] [place], [place] [address]    CT ABDOMEN PELVIS W CONTRAST  Result Date: 04/01/2023 CLINICAL DATA:  Acute abdominal pain. Diarrhea. Multiple myeloma and prostate carcinoma. * Tracking Code: BO * EXAM: CT ABDOMEN AND PELVIS WITH CONTRAST TECHNIQUE: Multidetector CT imaging of the abdomen and pelvis was performed using the standard protocol following bolus administration of intravenous contrast. RADIATION DOSE REDUCTION: This exam was performed according to the departmental dose-optimization program which includes automated exposure control, adjustment of the mA and/or kV according to patient size and/or use of iterative reconstruction technique. CONTRAST:  OMNIPAQUE IOHEXOL 300 MG/ML  SOLN COMPARISON:  10/20/2009 FINDINGS: Lower Chest: Small bilateral pleural effusions and mild bibasilar atelectasis. Hepatobiliary: No suspicious hepatic masses identified. Stable tiny left hepatic lobe cyst. Few tiny gallstones are seen, however there is no evidence of cholecystitis or biliary ductal dilatation. Pancreas:  No mass or inflammatory changes. Spleen: Within normal limits in size and appearance. Adrenals/Urinary Tract: No  suspicious masses identified. No evidence of ureteral calculi or hydronephrosis. Stomach/Bowel: No evidence of obstruction, inflammatory process or abnormal fluid collections. Vascular/Lymphatic: No pathologically enlarged lymph nodes. No acute vascular findings. Reproductive:  Prostate brachytherapy seeds noted. Other:  None. Musculoskeletal: No suspicious bone lesions identified. Left hip prosthesis and previous lower thoracic vertebroplasty noted. IMPRESSION: No acute findings within abdomen or pelvis. Cholelithiasis. No radiographic evidence of cholecystitis. Small bilateral pleural effusions and mild bibasilar atelectasis. Electronically Signed   By: Danae Orleans M.D.   On: 04/01/2023 11:00    Pending Labs Unresulted Labs (From admission, onward)     Start     Ordered   04/01/23 0850  Gastrointestinal Panel by PCR , Stool  (Gastrointestinal Panel by PCR, Stool                                                                                                                                                     **  Does Not include CLOSTRIDIUM DIFFICILE testing. **If CDIFF testing is needed, place order from the "C Difficile Testing" order set.**)  Once,   URGENT        04/01/23 0850            Vitals/Pain Today's Vitals   04/01/23 0753 04/01/23 1030 04/01/23 1045 04/01/23 1151  BP:  (!) 135/92 (!) 129/96   Pulse:  (!) 102 77   Resp:  14 19   Temp:    (!) 97.5 F (36.4 C)  TempSrc:    Oral  SpO2:  98% 97%   Weight: 87.1 kg     Height: 6' (1.829 m)     PainSc: 0-No pain       Isolation Precautions Enteric precautions (UV disinfection)  Medications Medications  morphine (PF) 4 MG/ML injection 4 mg (0 mg Intravenous Hold 04/01/23 0817)  potassium chloride 10 mEq in 100 mL IVPB (10 mEq Intravenous New Bag/Given 04/01/23 1256)  ondansetron (ZOFRAN) injection 4 mg (4 mg Intravenous Given 04/01/23 0815)  sodium chloride 0.9 % bolus 1,000 mL (0 mLs Intravenous Stopped 04/01/23 0946)  iohexol  (OMNIPAQUE) 300 MG/ML solution 100 mL (100 mLs Intravenous Contrast Given 04/01/23 1004)  potassium chloride SA (KLOR-CON M) CR tablet 40 mEq (40 mEq Oral Given 04/01/23 1031)  magnesium sulfate IVPB 2 g 50 mL (0 g Intravenous Stopped 04/01/23 1128)  lactated ringers bolus 500 mL (0 mLs Intravenous Stopped 04/01/23 1128)    Mobility non-ambulatory     Focused Assessments  R Recommendations: See Admitting Provider Note  Report given to:   Additional Notes:

## 2023-04-01 NOTE — ED Provider Notes (Signed)
[place] EMERGENCY DEPARTMENT AT [facility] Provider Note   CSN: [id number] Arrival date & time: 04/01/23  7628     History  Chief Complaint  Patient presents with  . Diarrhea    Ronald Duran is a 82 y.o. male with past medical history hyperlipidemia, hypertension, CAD status post cardiac cath in 2016, prostate cancer in remission, multiple myeloma currently being treated, heart failure with last echo 35 to 40% in June 2024 who presents to the ED complaining of frequent diarrhea since yesterday.  States that he had 2 normal bowel movements yesterday morning and then around noon began to have loose bowel movements every 30 minutes.  Overnight, he states that the bowel movements became more liquid in nature.  They are still occurring about every 30 minutes.  Some minor associated lower abdominal pain but no hematochezia, melena, rectal bleeding, fever, urinary symptoms, nausea, or vomiting.  Patient is currently receiving chemotherapy for his multiple myeloma.  He underwent radiation in May of this year.  No known sick contacts.  No change in diet or known spoiled foods.  No recent antibiotic use.  No history of bowel obstruction.  States that other than the diarrhea, he is feeling well.      Home Medications Prior to Admission medications   Medication Sig Start Date End Date Taking? Authorizing Provider  atorvastatin (LIPITOR) 40 MG tablet Take 40 mg by mouth at bedtime. 04/22/17  Yes [provider]  cetirizine (ZYRTEC ALLERGY) 10 MG tablet Take 1 tablet (10 mg total) by mouth daily. 01/14/23  Yes Wallis Bamberg, PA-C  ketoconazole (NIZORAL) 2 % cream Apply 1 Application topically daily as needed for irritation. 03/28/23  Yes [provider]  nitroGLYCERIN (NITROSTAT) 0.4 MG SL tablet PLACE 1 TABLET UNDER THE TONGUE IF NEEDED EVERY 5 MINUTES FOR CHEST PAIN FOR 3 DOSES IF NO RELIEF AFTER FIRST DOSE CALL 911. Patient taking differently: Place 0.4 mg under the  tongue every 5 (five) minutes as needed for chest pain. 01/26/22  Yes [name], MD  prochlorperazine (COMPAZINE) 10 MG tablet Take 1 tablet (10 mg total) by mouth every 6 (six) hours as needed for nausea or vomiting. 11/24/22  Yes Si Gaul, MD  traMADol (ULTRAM) 50 MG tablet Take 1 tablet (50 mg total) by mouth every 12 (twelve) hours as needed. Patient taking differently: Take 50 mg by mouth every 12 (twelve) hours as needed for moderate pain. 03/06/23  Yes Heilingoetter, Cassandra L, PA-C  warfarin (COUMADIN) 5 MG tablet TAKE 1/2 TO 1 TABLET BY MOUTH AS DIRECTED BY COUMADIN CLINIC. Patient taking differently: Take 2.5-5 mg by mouth daily. 4 days - 1 tablet and 3 days - 1/2 tablet 02/06/23  Yes Chandrasekhar, Mahesh A, MD  acyclovir (ZOVIRAX) 200 MG capsule Take 1 capsule (200 mg total) by mouth 2 (two) times daily. 11/23/22   Si Gaul, MD  B Complex Vitamins (B COMPLEX 100) tablet Take 1 tablet by mouth daily. Balance    [provider]  Cholecalciferol (VITAMIN D3) 2000 UNITS TABS Take 2,000 Units by mouth daily.    [provider]  Coenzyme Q10 (CO Q-10) 100 MG CAPS Take 100 mg by mouth daily with supper.    [provider]  dexamethasone (DECADRON) 4 MG tablet 5 tab weekly start with the first dose of chemotherapy. Patient taking differently: Take 20 mg by mouth See admin instructions. Take 5 tablets by mouth weekly start with the first dose of chemotherapy. Patient states  he did take today because he thought maybe he would have Chemo today per patient 11/23/22   Si Gaul, MD  lenalidomide (REVLIMID) 15 MG capsule Take 1 capsule (15 mg total) by mouth daily. Take for 14 days, then none for 7 days. Repeat every 21 days. Start Revlimid on day 1 of chemotherapy Auth #[id number] date obtained 12/19/2022 Adult Male. Patient not taking: Reported on 02/03/2023 12/28/22   Si Gaul, MD  metoprolol tartrate (LOPRESSOR) 50 MG tablet Take 1 tablet (50 mg  total) by mouth 2 (two) times daily. 01/28/23   Meredeth Ide, MD  Multiple Vitamins-Minerals (CENTRUM) tablet Take 1 tablet by mouth daily.    [provider]  omeprazole (PRILOSEC) 20 MG capsule Take 20 mg by mouth daily before breakfast. 06/28/22   [provider]  triamcinolone ointment (KENALOG) 0.5 % Apply 1 Application topically 2 (two) times daily. Patient taking differently: Apply 1 Application topically daily as needed (For left foot rash). 12/19/22   Shanon Ace, PA-C      Allergies    Patient has no known allergies.    Review of Systems   Review of Systems  All other systems reviewed and are negative.   Physical Exam Updated Vital Signs BP (!) 129/96   Pulse 77   Temp 97.7 F (36.5 C) (Oral)   Resp 19   Ht 6' (1.829 m)   Wt 87.1 kg   SpO2 97%   BMI 26.04 kg/m  Physical Exam Vitals and nursing note reviewed.  Constitutional:      General: He is not in acute distress.    Appearance: Normal appearance.  HENT:     Head: Normocephalic and atraumatic.     Mouth/Throat:     Mouth: Mucous membranes are dry.  Eyes:     Conjunctiva/sclera: Conjunctivae normal.  Cardiovascular:     Rate and Rhythm: Normal rate and regular rhythm.     Heart sounds: No murmur heard. Pulmonary:     Effort: Pulmonary effort is normal.     Breath sounds: Normal breath sounds.  Abdominal:     General: Abdomen is flat. There is no distension.     Palpations: Abdomen is soft.     Tenderness: There is no abdominal tenderness. There is no right CVA tenderness, left CVA tenderness, guarding or rebound.  Musculoskeletal:        General: Normal range of motion.     Cervical back: Neck supple.     Right lower leg: No edema.     Left lower leg: No edema.  Skin:    General: Skin is warm and dry.     Capillary Refill: Capillary refill takes less than 2 seconds.     Coloration: Skin is not jaundiced or pale.     Findings: No rash.  Neurological:     General: No  focal deficit present.     Mental Status: He is alert and oriented to person, place, and time.  Psychiatric:        Mood and Affect: Mood normal.        Behavior: Behavior normal.     ED Results / Procedures / Treatments   Labs (all labs ordered are listed, but only abnormal results are displayed) Labs Reviewed  CBC WITH DIFFERENTIAL/PLATELET - Abnormal; Notable for the following components:      Result Value   MCV 103.2 (*)    MCH 34.3 (*)    RDW 16.1 (*)    Platelets 55 (*)  Lymphs Abs 0.4 (*)    Abs Immature Granulocytes 0.13 (*)    All other components within normal limits  COMPREHENSIVE METABOLIC PANEL - Abnormal; Notable for the following components:   Sodium 131 (*)    Potassium 2.5 (*)    CO2 17 (*)    Glucose, Bld 103 (*)    Calcium 7.1 (*)    Total Protein 4.2 (*)    Albumin 2.4 (*)    All other components within normal limits  URINALYSIS, ROUTINE W REFLEX MICROSCOPIC - Abnormal; Notable for the following components:   Color, Urine AMBER (*)    APPearance HAZY (*)    Ketones, ur 5 (*)    All other components within normal limits  GASTROINTESTINAL PANEL BY PCR, STOOL (REPLACES STOOL CULTURE)  C DIFFICILE QUICK SCREEN W PCR REFLEX    SARS CORONAVIRUS 2 BY RT PCR  LIPASE, BLOOD  MAGNESIUM    EKG None  Radiology CT ABDOMEN PELVIS W CONTRAST  Result Date: 04/01/2023 CLINICAL DATA:  Acute abdominal pain. Diarrhea. Multiple myeloma and prostate carcinoma. * Tracking Code: BO * EXAM: CT ABDOMEN AND PELVIS WITH CONTRAST TECHNIQUE: Multidetector CT imaging of the abdomen and pelvis was performed using the standard protocol following bolus administration of intravenous contrast. RADIATION DOSE REDUCTION: This exam was performed according to the departmental dose-optimization program which includes automated exposure control, adjustment of the mA and/or kV according to patient size and/or use of iterative reconstruction technique. CONTRAST:  OMNIPAQUE IOHEXOL 300  MG/ML  SOLN COMPARISON:  10/20/2009 FINDINGS: Lower Chest: Small bilateral pleural effusions and mild bibasilar atelectasis. Hepatobiliary: No suspicious hepatic masses identified. Stable tiny left hepatic lobe cyst. Few tiny gallstones are seen, however there is no evidence of cholecystitis or biliary ductal dilatation. Pancreas:  No mass or inflammatory changes. Spleen: Within normal limits in size and appearance. Adrenals/Urinary Tract: No suspicious masses identified. No evidence of ureteral calculi or hydronephrosis. Stomach/Bowel: No evidence of obstruction, inflammatory process or abnormal fluid collections. Vascular/Lymphatic: No pathologically enlarged lymph nodes. No acute vascular findings. Reproductive:  Prostate brachytherapy seeds noted. Other:  None. Musculoskeletal: No suspicious bone lesions identified. Left hip prosthesis and previous lower thoracic vertebroplasty noted. IMPRESSION: No acute findings within abdomen or pelvis. Cholelithiasis. No radiographic evidence of cholecystitis. Small bilateral pleural effusions and mild bibasilar atelectasis. Electronically Signed   By: Danae Orleans M.D.   On: 04/01/2023 11:00    Procedures Procedures    Medications Ordered in ED Medications  morphine (PF) 4 MG/ML injection 4 mg (0 mg Intravenous Hold 04/01/23 0817)  magnesium sulfate IVPB 2 g 50 mL (2 g Intravenous New Bag/Given 04/01/23 1032)  potassium chloride 10 mEq in 100 mL IVPB (10 mEq Intravenous New Bag/Given 04/01/23 1033)  ondansetron (ZOFRAN) injection 4 mg (4 mg Intravenous Given 04/01/23 0815)  sodium chloride 0.9 % bolus 1,000 mL (0 mLs Intravenous Stopped 04/01/23 0946)  iohexol (OMNIPAQUE) 300 MG/ML solution 100 mL (100 mLs Intravenous Contrast Given 04/01/23 1004)  potassium chloride SA (KLOR-CON M) CR tablet 40 mEq (40 mEq Oral Given 04/01/23 1031)  lactated ringers bolus 500 mL (500 mLs Intravenous New Bag/Given 04/01/23 1031)    ED Course/ Medical Decision Making/ A&P Clinical  Course as of 04/01/23 1150  Sat Apr 01, 2023  0945 Patient updated on findings. No acute complaints. Potassium 2.5, will plan to replete with potassium for absorption. Pending CT. No further episodes of diarrhea in ED. Stool studies ordered if pt able to provide sample.  [MG]  1125 Patient rechecked, doing well.  No acute complaints.  Receiving IV potassium.  Updated on plan for admission. [MG]    Clinical Course User Index [MG] Tonette Lederer, PA-C                                 Medical Decision Making Amount and/or Complexity of Data Reviewed Labs: ordered. Decision-making details documented in ED Course. Radiology: ordered. Decision-making details documented in ED Course. ECG/medicine tests: ordered.  Risk Prescription drug management. Decision regarding hospitalization.   Medical Decision Making:   PARIN BONAM is a 82 y.o. male who presented to the ED today with diarrhea detailed above.    Additional history discussed with patient's family/caregivers.  Patient's presentation is complicated by their history of multiple myeloma, hypertension, hyperlipidemia, CAD.  Complete initial physical exam performed, notably the patient was in no acute distress, nontoxic-appearing.  Abdomen soft and nontender.  No abdominal distention.  No CVA tenderness.  Patient neurologically intact.  Mildly dry mucous membranes.    Reviewed and confirmed nursing documentation for past medical history, family history, social history.    Initial Assessment:   With the patient's presentation, differential diagnosis includes but is not limited to SBO, malignancy, electrolyte disturbance, dehydration, AKI, infectious colitis, viral syndrome, bacterial diarrhea, medication reaction. This is most consistent with an acute complicated illness  Initial Plan:  Screening labs including CBC and Metabolic panel to evaluate for infectious or metabolic etiology of disease.  Lipase to assess for  pancreatitis Magnesium to assess for deficiency CT abdomen pelvis to assess for intra-abdominal pathology Stool studies EKG to assess for cardiac pathology Symptomatic management Objective evaluation as below reviewed   Initial Study Results:   Laboratory  All laboratory results reviewed without evidence of clinically relevant pathology.   Exceptions include: Sodium 131, potassium 2.5, CO2 17, albumin 2.4, platelets 55  Radiology: All images reviewed independently. Agree with radiology report at this time.   CT ABDOMEN PELVIS W CONTRAST  Result Date: 04/01/2023 CLINICAL DATA:  Acute abdominal pain. Diarrhea. Multiple myeloma and prostate carcinoma. * Tracking Code: BO * EXAM: CT ABDOMEN AND PELVIS WITH CONTRAST TECHNIQUE: Multidetector CT imaging of the abdomen and pelvis was performed using the standard protocol following bolus administration of intravenous contrast. RADIATION DOSE REDUCTION: This exam was performed according to the departmental dose-optimization program which includes automated exposure control, adjustment of the mA and/or kV according to patient size and/or use of iterative reconstruction technique. CONTRAST:  OMNIPAQUE IOHEXOL 300 MG/ML  SOLN COMPARISON:  10/20/2009 FINDINGS: Lower Chest: Small bilateral pleural effusions and mild bibasilar atelectasis. Hepatobiliary: No suspicious hepatic masses identified. Stable tiny left hepatic lobe cyst. Few tiny gallstones are seen, however there is no evidence of cholecystitis or biliary ductal dilatation. Pancreas:  No mass or inflammatory changes. Spleen: Within normal limits in size and appearance. Adrenals/Urinary Tract: No suspicious masses identified. No evidence of ureteral calculi or hydronephrosis. Stomach/Bowel: No evidence of obstruction, inflammatory process or abnormal fluid collections. Vascular/Lymphatic: No pathologically enlarged lymph nodes. No acute vascular findings. Reproductive:  Prostate brachytherapy seeds  noted. Other:  None. Musculoskeletal: No suspicious bone lesions identified. Left hip prosthesis and previous lower thoracic vertebroplasty noted. IMPRESSION: No acute findings within abdomen or pelvis. Cholelithiasis. No radiographic evidence of cholecystitis. Small bilateral pleural effusions and mild bibasilar atelectasis. Electronically Signed   By: Danae Orleans M.D.   On: 04/01/2023 11:00   VAS [place] LOWER  EXTREMITY VENOUS (DVT)  Result Date: 03/28/2023  Lower Venous DVT Study Patient Name:  EYDEN SCHEY  Date of Exam:   03/28/2023 Medical Rec #: [id number]         Accession #:    [id number] Date of Birth: [birth year]        Patient Gender: M Patient Age:   49 years Exam Location:  [facility] Procedure:      VAS [place] LOWER EXTREMITY VENOUS (DVT) Referring Phys: [facility] MOHAMED --------------------------------------------------------------------------------  Indications: Swelling.  Risk Factors: Cancer. Limitations: Poor ultrasound/tissue interface. Comparison Study: No prior studies. Performing Technologist: Chanda Busing RVT  Examination Guidelines: A complete evaluation includes B-mode imaging, spectral Doppler, color Doppler, and power Doppler as needed of all accessible portions of each vessel. Bilateral testing is considered an integral part of a complete examination. Limited examinations for reoccurring indications may be performed as noted. The reflux portion of the exam is performed with the patient in reverse Trendelenburg.  +-----+---------------+---------+-----------+----------+--------------+ RIGHTCompressibilityPhasicitySpontaneityPropertiesThrombus Aging +-----+---------------+---------+-----------+----------+--------------+ CFV  Full           Yes      Yes                                 +-----+---------------+---------+-----------+----------+--------------+   +---------+---------------+---------+-----------+----------+--------------+ LEFT      CompressibilityPhasicitySpontaneityPropertiesThrombus Aging +---------+---------------+---------+-----------+----------+--------------+ CFV      Full           Yes      Yes                                 +---------+---------------+---------+-----------+----------+--------------+ SFJ      Full                                                        +---------+---------------+---------+-----------+----------+--------------+ FV Prox  Full                                                        +---------+---------------+---------+-----------+----------+--------------+ FV Mid   Full                                                        +---------+---------------+---------+-----------+----------+--------------+ FV DistalFull                                                        +---------+---------------+---------+-----------+----------+--------------+ PFV      Full                                                        +---------+---------------+---------+-----------+----------+--------------+ POP  Full           Yes      Yes                                 +---------+---------------+---------+-----------+----------+--------------+ PTV      Full                                                        +---------+---------------+---------+-----------+----------+--------------+ PERO     Full                                                        +---------+---------------+---------+-----------+----------+--------------+    Summary: RIGHT: - No evidence of common femoral vein obstruction.  LEFT: - There is no evidence of deep vein thrombosis in the lower extremity.  - No cystic structure found in the popliteal fossa.  *See table(s) above for measurements and observations. Electronically signed by Waverly Ferrari MD on 03/28/2023 at 2:38:11 PM.    Final    DG Lumbar Spine 2-3 Views  Result Date: 03/06/2023 CLINICAL DATA:  Low back pain, multiple  myeloma EXAM: LUMBAR SPINE - 2-3 VIEW COMPARISON:  PET-CT 09/19/2022, CT abdomen/pelvis 10/20/2009 FINDINGS: There are 5 non-rib-bearing lumbar-type vertebral bodies. Vertebral body heights are preserved, without evidence of acute injury. There is levocurvature centered at L3. There is no antero or retrolisthesis. There is mild-to-moderate disc space narrowing and facet arthropathy at L3-L4 through L5-S1. The SI joints and symphysis pubis are intact. Postsurgical changes are noted in the proximal left femur. IMPRESSION: 1. No acute finding in the lumbar spine. Note that if there is concern for metastatic disease, cross-sectional imaging is recommended. 2. Levocurvature centered at L3 and mild-to-moderate degenerative changes at L3-L4 through L5-S1. Electronically Signed   By: Lesia Hausen M.D.   On: 03/06/2023 11:02      Consults: Case discussed with Dr. Robb Matar with hospital medicine who accepts admission.   Final Assessment and Plan:   82 year old male with past medical history of multiple myeloma currently undergoing chemotherapy treatment presents the ED complaining of frequent diarrhea.  Initially with soft blood pressure, appears dehydrated.  Recommended she has bed for further assessment.  IV fluids initiated for hydration.  Given dose of Zofran to help slow diarrhea.  Abdomen soft and nontender.  No acute pain.  Afebrile, nontoxic-appearing.  No known sick contacts.  No known medical issues.  No recent antibiotic use.  No history of C. difficile.  Workup reveals a hypokalemia at 2.5.  Repletion initiated as well as IV magnesium.  CT abdomen pelvis with no acute findings.  Otherwise labs appear to be similar to baseline.  Stool studies ordered and pending.  With hypokalemia in the setting of high risk patient being treated for multiple myeloma with chemotherapy, discussed with hospitalist who will admit.  Patient agreeable with plan for admission.     Clinical Impression:  1. Diarrhea, unspecified  type   2. Hypokalemia   3. Dehydration      Admit           Final Clinical Impression(s) / ED Diagnoses Final  diagnoses:  Hypokalemia  Diarrhea, unspecified type  Dehydration    Rx / DC Orders ED Discharge Orders     None         Tonette Lederer, PA-C 04/01/23 1150    Melene Plan, DO 04/01/23 1343

## 2023-04-01 NOTE — H&P (Signed)
History and Physical    Patient: Ronald Duran UEA:[id number] DOB: February 14, 1941 DOA: 04/01/2023 DOS: the patient was seen and examined on 04/01/2023 PCP: Farris Has, MD  Patient coming from: Home  Chief Complaint:  Chief Complaint  Patient presents with   Diarrhea   HPI: Ronald Duran is a 82 y.o. 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  He has been having a bowel movement about every 30 minutes associated with mild generalized abdominal discomfort.  No nausea, emesis,constipation, melena or hematochezia.  No flank pain, dysuria, frequency or hematuria.He denied fever, chills, rhinorrhea, sore throat, wheezing or hemoptysis.  No chest pain, palpitations, diaphoresis, PND, orthopnea or pitting edema of the lower extremities.   No polyuria, polydipsia, polyphagia or blurred vision.   Lab work: His urinalysis was hazy with ketones of 5 mg/dL.  Rest of the urinalysis was unremarkable.  CBC showed a white count 7.7, hemoglobin 15.2 g/dL with an MCV of 478.2 fL and platelets 55.  Lipase is normal.  Magnesium 1.8 mg/dL.  Sodium 131, potassium 2.5, chloride 108 and CO2 17 mmol/L.  Glucose 103 and corrected calcium 8.6 mg/deciliter.  Total protein is 4.2 and albumin 2.4 g/dL.  Renal function and the rest of the electrolytes were normal.  Imaging: CT abdomen/pelvis with contrast with no acute findings within the abdomen or pelvis.  There was  cholelithiasis without evidence of cholecystitis.  Small bilateral pleural effusions and mild bibasilar atelectasis.   ED course: Initial vital signs were temperature 97.7 F, pulse 67, respiration 15, BP 111/76 mmHg O2 sat 95% room air.  The patient received LR 500 mL bolus, normal saline 1000 mL bolus, morphine 4 mg IVP x 1, ondansetron 4 mg IVP x 1, KCl 10 mEq IVPB x 3 and KCl 40 mEq p.o. x 1.  Review of Systems: As mentioned in the history of present illness. All other systems reviewed and are negative.  Past Medical History:  Diagnosis Date   Atrial fibrillation (HCC)    CAD (coronary artery disease)    a. s/p MI with LAD stent placement December, 1999 -last stress test November 2008 with no EKG evidence of ischemia  b. cath 07/23/2015 after abnormal ETT and nuc, occluded mid LAD stent with good distal collateral, medical therapy    Cardiomyopathy (HCC)    Ischemic cardiomyopathy with EF 40% by echo 2005   COVID 2021   mild case   Dyslipidemia    Erectile dysfunction    GERD (gastroesophageal reflux disease)    Hiatal hernia    with esophageal strictures and dilatations in the past - Dr. [name]   History of nuclear stress test    Myoview 12/16: EF 47%, anterior, anteroseptal and inferoseptal defect suggesting infarct with peri-infarct ischemia, intermediate risk   HTN (hypertension)    Hx of seasonal allergies    Hyperlipidemia    Myocardial infarction (HCC) 08/01/1998   PAD (peripheral artery disease) (HCC)    Pre-diabetes    Prostate cancer (HCC)    with seed implant  therapy following Lupron therapy in 2008 and 2009 - followed by Dr. Isabel Caprice   Ringworm    Stroke [facility]) 04/20/2017   seen on a MRI   Vitamin D deficiency    Past Surgical History:  Procedure Laterality Date   BRONCHIAL BIOPSY  11/15/2022   Procedure: BRONCHIAL BIOPSIES;  Surgeon: [name], DO;  Location: [facility] ENDOSCOPY;  Service: Pulmonary;;   BRONCHIAL BRUSHINGS  11/15/2022   Procedure: BRONCHIAL  BRUSHINGS;  Surgeon: [name], DO;  Location: [facility] ENDOSCOPY;  Service: Pulmonary;;   BRONCHIAL NEEDLE ASPIRATION BIOPSY  11/15/2022   Procedure: BRONCHIAL NEEDLE ASPIRATION BIOPSIES;  Surgeon: [name], DO;  Location: [facility] ENDOSCOPY;  Service: Pulmonary;;   CARDIAC CATHETERIZATION N/A 07/23/2015   Procedure: Left Heart Cath and Coronary Angiography;  Surgeon: [name], MD;  Location: [facility] INVASIVE CV LAB;  Service: Cardiovascular;  Laterality: N/A;   CARDIOVERSION N/A 06/03/2022   Procedure: CARDIOVERSION;  Surgeon: [name], MD;  Location: [facility] ENDOSCOPY;  Service: Cardiovascular;  Laterality: N/A;   COLONOSCOPY WITH PROPOFOL N/A 11/24/2015   Procedure: COLONOSCOPY WITH PROPOFOL;  Surgeon: [name], MD;  Location: [facility] ENDOSCOPY;  Service: Endoscopy;  Laterality: N/A;   esophageal strictures with dilatations in the past per Dr. [name]     last '98   FIDUCIAL MARKER PLACEMENT  11/15/2022   Procedure: FIDUCIAL MARKER PLACEMENT;  Surgeon: [name], DO;  Location: [facility] ENDOSCOPY;  Service: Pulmonary;;   HERNIA REPAIR Left    INTRAMEDULLARY (IM) NAIL INTERTROCHANTERIC Left 10/25/2022   Procedure: LEFT INTRAMEDULLARY (IM) NAIL INTERTROCHANTERIC;  Surgeon: [name], MD;  Location: [facility] OR;  Service: Orthopedics;  Laterality: Left;   IR BONE MARROW BIOPSY & ASPIRATION  11/17/2022   IR BONE TUMOR(S)RF ABLATION  10/11/2022   IR KYPHO THORACIC WITH BONE BIOPSY  10/11/2022   LAD stent placement - Dr. [name]     left inguinal hernia repair - Dr. [name]     prostatic radioactive seed implantation - Dr. [name]     '09   Social History:  reports that he has never smoked. He has been exposed to tobacco smoke. He has never used smokeless tobacco. He reports that he does not currently use alcohol. He reports that he does not use drugs.  No Known Allergies  Family History  Problem Relation Age of Onset   CVA Mother    CVA Father 48   CAD Sister     Prior to  Admission medications   Medication Sig Start Date End Date Taking? Authorizing Provider  ketoconazole (NIZORAL) 2 % cream Apply 1 Application topically daily as needed for irritation. 03/28/23  Yes [provider]  acyclovir (ZOVIRAX) 200 MG capsule Take 1 capsule (200 mg total) by mouth 2 (two) times daily. 11/23/22   Si Gaul, MD  atorvastatin (LIPITOR) 40 MG tablet Take 40 mg by mouth at bedtime. 04/22/17   [provider]  B Complex Vitamins (B COMPLEX 100) tablet Take 1 tablet by mouth daily. Balance    [provider]  cetirizine (ZYRTEC ALLERGY) 10 MG tablet Take 1 tablet (10 mg total) by mouth daily. 01/14/23   Wallis Bamberg, PA-C  Cholecalciferol (VITAMIN D3) 2000 UNITS TABS Take 2,000 Units by mouth daily.    [provider]  Coenzyme Q10 (CO Q-10) 100 MG CAPS Take 100 mg by mouth daily with supper.    [provider]  dexamethasone (DECADRON) 4 MG tablet 5 tab weekly  start with the first dose of chemotherapy. Patient taking differently: Take 20 mg by mouth See admin instructions. Take 5 tablets by mouth weekly start with the first dose of chemotherapy. Patient states he did take today because he thought maybe he would have Chemo today per patient 11/23/22   Si Gaul, MD  lenalidomide (REVLIMID) 15 MG capsule Take 1 capsule (15 mg total) by mouth daily. Take for 14 days, then none for 7 days. Repeat every 21 days. Start Revlimid on day 1 of chemotherapy Auth #=[id number] date obtained 12/19/2022 Adult Male. Patient not taking: Reported on 02/03/2023 12/28/22   Si Gaul, MD  metoprolol tartrate (LOPRESSOR) 50 MG tablet Take 1 tablet (50 mg total) by mouth 2 (two) times daily. 01/28/23   Meredeth Ide, MD  Multiple Vitamins-Minerals (CENTRUM) tablet Take 1 tablet by mouth daily.    [provider]  nitroGLYCERIN (NITROSTAT) 0.4 MG SL tablet PLACE 1 TABLET UNDER THE TONGUE IF NEEDED EVERY 5 MINUTES FOR CHEST PAIN FOR 3 DOSES IF NO  RELIEF AFTER FIRST DOSE CALL 911. Patient taking differently: Place 0.4 mg under the tongue every 5 (five) minutes as needed for chest pain. 01/26/22   [name], MD  omeprazole (PRILOSEC) 20 MG capsule Take 20 mg by mouth daily before breakfast. 06/28/22   [provider]  prochlorperazine (COMPAZINE) 10 MG tablet Take 1 tablet (10 mg total) by mouth every 6 (six) hours as needed for nausea or vomiting. 11/24/22   Si Gaul, MD  traMADol (ULTRAM) 50 MG tablet Take 1 tablet (50 mg total) by mouth every 12 (twelve) hours as needed. 03/06/23   Heilingoetter, Cassandra L, PA-C  triamcinolone ointment (KENALOG) 0.5 % Apply 1 Application topically 2 (two) times daily. Patient taking differently: Apply 1 Application topically daily as needed (For left foot rash). 12/19/22   Namon Cirri E, PA-C  warfarin (COUMADIN) 5 MG tablet TAKE 1/2 TO 1 TABLET BY MOUTH AS DIRECTED BY COUMADIN CLINIC. 02/06/23   [name], MD    Physical Exam: Vitals:   04/01/23 0752 04/01/23 0753 04/01/23 1030 04/01/23 1045  BP: 111/76  (!) 135/92 (!) 129/96  Pulse: 67  (!) 102 77  Resp: 15  14 19   Temp: 97.7 F (36.5 C)     TempSrc: Oral     SpO2: 95%  98% 97%  Weight:  87.1 kg    Height:  6' (1.829 m)     Physical Exam Vitals and nursing note reviewed.  Constitutional:      Appearance: Normal appearance.  HENT:     Head: Normocephalic.     Nose: No rhinorrhea.     Mouth/Throat:     Mouth: Mucous membranes are dry.  Eyes:     General: No scleral icterus.    Pupils: Pupils are equal, round, and reactive to light.  Cardiovascular:     Rate and Rhythm: Normal rate and regular rhythm.  Pulmonary:     Effort: Pulmonary effort is normal.     Breath sounds: Normal breath sounds.  Abdominal:     General: Bowel sounds are normal.     Palpations: Abdomen is soft.  Musculoskeletal:     Cervical back: Neck supple.     Right lower leg: Edema present.     Left lower leg: Edema  present.  Skin:    General: Skin is warm and dry.  Neurological:     General: No focal deficit present.     Mental Status: He  is alert and oriented to person, place, and time.  Psychiatric:        Mood and Affect: Mood normal.        Behavior: Behavior normal.   Data Reviewed:  Results are pending, will review when available. 01/25/2023 echocardiogram IMPRESSIONS:   1. Left ventricular ejection fraction, by estimation, is 35-40%. The left ventricle has normal function. The left ventricle has no regional wall motion abnormalities. The left ventricular internal cavity size was moderately dilated. Left ventricular diastolic function could not be evaluated. There is akinesis of the left ventricular, inferior wall, lateral wall and anterior wall. There is akinesis of the left ventricular, apical segment. 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  2. Right ventricular systolic function is normal. The right ventricular size is normal. Tricuspid regurgitation signal is inadequate for assessing PA pressure.  3. The mitral valve is normal in structure. Trivial mitral valve regurgitation. No evidence of mitral stenosis.  4. The aortic valve was not well visualized. Aortic valve regurgitation is not visualized. Aortic valve sclerosis/calcification is present, without any evidence of aortic stenosis.  5. The inferior vena cava is dilated in size with >50% respiratory variability, suggesting right atrial pressure of 8 mmHg.  EKG: Vent. rate 113 BPM PR interval * ms QRS duration 91 ms QT/QTcB 367/504 ms P-R-T axes * 92 50 Atrial fibrillation Anteroseptal infarct, age indeterminate Prolonged QT interval  Assessment and Plan: Principal Problem:   Hypokalemia In the setting of:   Enteritis Observation/telemetry. Time-limited IV fluids. Continue potassium replacement. Antiemetics as needed. Analgesics as needed. Continue  home PPI. Follow CBC and chemistry in the morning.  Active Problems:   Prolonged QT interval Avoid QT prolonging meds as possible. KCl supplementation. Magnesium sulfate 2 g IVPB now. Keep electrolytes optimized. Check EKG in the morning.    CAD (coronary artery disease) On metoprolol, statin and anticoagulation.   Continue as needed nitroglycerin.    Atrial fibrillation (HCC) CHA?DS?-VASc Score of 8. Continue metoprolol ER 25 mg p.o. daily. Continue warfarin per pharmacy for anticoagulation.    Benign essential HTN Continue metoprolol as above. Monitor blood pressure and heart rate.     Hiatal hernia   Esophageal stricture Continue omeprazole or formulary equivalent.     Chronic systolic heart failure (HCC) No signs of decompensation at this time. Continue beta-blocker. Might benefit from ARB/ACE.    Dyslipidemia On atorvastatin 40 mg p.o. daily.    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 Shirline Frees at the cancer center.    Protein-calorie malnutrition, severe (HCC) In the setting of malignancy. Protein supplementation. Consider nutritional services evaluation.   Advance Care Planning:   Code Status: Full Code   Consults:   Family Communication:   Severity of Illness: The appropriate patient status for this patient is OBSERVATION. Observation status is judged to be reasonable and necessary in order to provide the required intensity of service to ensure the patient's safety. The patient's presenting symptoms, physical exam findings, and initial radiographic and laboratory data in the context of their medical condition is felt to place them at decreased risk for further clinical deterioration. Furthermore, it is anticipated that the patient will be medically stable for discharge from the hospital within 2 midnights of admission.   Author: Bobette Mo, MD 04/01/2023 11:19 AM  For on call review [email or web address].   This document was  prepared using Dragon voice recognition software and may contain some unintended transcription errors.

## 2023-04-01 NOTE — ED Notes (Signed)
Patient requesting to use bathroom. This nurse offered urinal. Patient stated he would rather go to bathroom. Wheelchair was retrieved and patient transferred from bed to wheelchair with supervision. Patient then taken to restroom via wheelchair and was able to get out of wheelchair and on to toilet without assistance. Patient stated he had diarrhea and asked if he could go ahead and do that in the toilet as well. Patient was encouraged to do whatever was needed on toilet and educated on how to notify staff when he was done using the bathroom.

## 2023-04-01 NOTE — Progress Notes (Signed)
ANTICOAGULATION CONSULT NOTE - Initial Consult  Pharmacy Consult for Warfarin Indication: atrial fibrillation  No Known Allergies  Patient Measurements: Height: 6' (182.9 cm) Weight: 87.1 kg (192 lb) IBW/kg (Calculated) : 77.6 Heparin Dosing Weight:   Vital Signs: Temp: 98.3 F (36.8 C) (08/24 1523) Temp Source: Oral (08/24 1523) BP: 132/90 (08/24 1523) Pulse Rate: 100 (08/24 1523)  Labs: Recent Labs    04/01/23 0805 04/01/23 0846  HGB 15.2  --   HCT 45.7  --   PLT 55*  --   CREATININE  --  0.74    Estimated Creatinine Clearance: 82.2 mL/min (by C-G formula based on SCr of 0.74 mg/dL).   Medical History: Past Medical History:  Diagnosis Date   Atrial fibrillation (HCC)    CAD (coronary artery disease)    a. s/p MI with LAD stent placement December, 1999 -last stress test November 2008 with no EKG evidence of ischemia  b. cath 07/23/2015 after abnormal ETT and nuc, occluded mid LAD stent with good distal collateral, medical therapy    Cardiomyopathy (HCC)    Ischemic cardiomyopathy with EF 40% by echo 2005   COVID 2021   mild case   Dyslipidemia    Erectile dysfunction    GERD (gastroesophageal reflux disease)    Hiatal hernia    with esophageal strictures and dilatations in the past - Dr. [name]   History of nuclear stress test    Myoview 12/16: EF 47%, anterior, anteroseptal and inferoseptal defect suggesting infarct with peri-infarct ischemia, intermediate risk   HTN (hypertension)    Hx of seasonal allergies    Hyperlipidemia    Myocardial infarction (HCC) 08/01/1998   PAD (peripheral artery disease) (HCC)    Pre-diabetes    Prostate cancer (HCC)    with seed implant therapy following Lupron therapy in 2008 and 2009 - followed by Dr. Isabel Caprice   Ringworm    Stroke (HCC) 04/20/2017   seen on a MRI   Vitamin D deficiency     Medications:  Scheduled:    morphine injection  4 mg Intravenous Once   Infusions:   Assessment: 46 yoM presented to ED  on 8/24 with diarrhea.  His PMH is significant for warfarin anticoagulation for afib and recent chemo for lung caner.  Pharmacy is consulted to resume inpatient dosing.    Home warfarin dose is 5mg  daily (Sun,Tues,Thurs,Sat) except 2.5 mg on 3 days per week (MWF).  Last dose 2.5 mg on Friday.    Admit INR 2.6, therapeutic CBC:  Hgb 15, Plt down to 55 No major drug-drug interactions noted, but diarrhea may decrease Vitamin K re-absorption and lead to prolonged INR.    Goal of Therapy:  INR 2-3 Monitor platelets by anticoagulation protocol: Yes   Plan:  Warfarin 2.5 mg PO x 1. Daily PT/INR. Monitor for signs and symptoms of bleeding.   Lynann Beaver PharmD, BCPS [facility] main pharmacy [phone] 04/01/2023 3:39 PM

## 2023-04-01 NOTE — ED Triage Notes (Addendum)
Pt coming in today complaining of diarrhea since 1200 yesterday. Pt states that the diarrhea has progressed to almost a clear/yellow liquid. States he has to go about every half hour. Denies any fevers or related pain.

## 2023-04-02 DIAGNOSIS — Z1152 Encounter for screening for COVID-19: Secondary | ICD-10-CM | POA: Diagnosis not present

## 2023-04-02 DIAGNOSIS — E876 Hypokalemia: Secondary | ICD-10-CM | POA: Diagnosis not present

## 2023-04-02 DIAGNOSIS — E871 Hypo-osmolality and hyponatremia: Secondary | ICD-10-CM | POA: Diagnosis not present

## 2023-04-02 DIAGNOSIS — R197 Diarrhea, unspecified: Secondary | ICD-10-CM | POA: Diagnosis not present

## 2023-04-02 LAB — GASTROINTESTINAL PANEL BY PCR, STOOL (REPLACES STOOL CULTURE)

## 2023-04-02 LAB — COMPREHENSIVE METABOLIC PANEL
ALT: 26 U/L (ref 0–44)
AST: 22 U/L (ref 15–41)
Albumin: 3 g/dL — ABNORMAL LOW (ref 3.5–5.0)
Alkaline Phosphatase: 60 U/L (ref 38–126)
Anion gap: 5 (ref 5–15)
BUN: 20 mg/dL (ref 8–23)
CO2: 18 mmol/L — ABNORMAL LOW (ref 22–32)
Calcium: 7.9 mg/dL — ABNORMAL LOW (ref 8.9–10.3)
Chloride: 109 mmol/L (ref 98–111)
Creatinine, Ser: 0.83 mg/dL (ref 0.61–1.24)
GFR, Estimated: >60 mL/min (ref >60)
Glucose, Bld: 98 mg/dL (ref 70–99)
Potassium: 4.4 mmol/L (ref 3.5–5.1)
Sodium: 132 mmol/L — ABNORMAL LOW (ref 135–145)
Total Bilirubin: 1.1 mg/dL (ref 0.3–1.2)
Total Protein: 4.9 g/dL — ABNORMAL LOW (ref 6.5–8.1)

## 2023-04-02 LAB — PROTIME-INR
INR: 2.7 — ABNORMAL HIGH (ref 0.8–1.2)
Prothrombin Time: 29.1 s — ABNORMAL HIGH (ref 11.4–15.2)

## 2023-04-02 LAB — CBC
HCT: 40.9 % (ref 39.0–52.0)
Hemoglobin: 13.5 g/dL (ref 13.0–17.0)
MCH: 33.8 pg (ref 26.0–34.0)
MCHC: 33 g/dL (ref 30.0–36.0)
MCV: 102.3 fL — ABNORMAL HIGH (ref 80.0–100.0)
Platelets: 44 10*3/uL — ABNORMAL LOW (ref 150–400)
RBC: 4 MIL/uL — ABNORMAL LOW (ref 4.22–5.81)
RDW: 16.1 % — ABNORMAL HIGH (ref 11.5–15.5)
WBC: 6.7 10*3/uL (ref 4.0–10.5)
nRBC: 0.3 % — ABNORMAL HIGH (ref 0.0–0.2)

## 2023-04-02 MED ORDER — DEXAMETHASONE 4 MG PO TABS
20.0000 mg | ORAL_TABLET | Freq: Once | ORAL | Status: AC
  Administered 2023-04-03: 20 mg via ORAL
  Filled 2023-04-02 (×2): qty 5

## 2023-04-02 MED ORDER — WARFARIN SODIUM 2.5 MG PO TABS: 2.5000 mg | ORAL_TABLET | ORAL | Status: DC

## 2023-04-02 MED ORDER — WARFARIN SODIUM 5 MG PO TABS
5.0000 mg | ORAL_TABLET | ORAL | Status: DC
  Administered 2023-04-02: 5 mg via ORAL
  Filled 2023-04-02: qty 1

## 2023-04-02 MED ORDER — LOPERAMIDE HCL 2 MG PO CAPS
2.0000 mg | ORAL_CAPSULE | Freq: Four times a day (QID) | ORAL | Status: DC | PRN
  Administered 2023-04-02: 2 mg via ORAL
  Filled 2023-04-02: qty 1

## 2023-04-02 NOTE — Progress Notes (Signed)
PROGRESS NOTE  Ronald Duran  DOB: 1941-07-16  PCP: Farris Has, MD GNF:[id number]  DOA: 04/01/2023  LOS: 0 days  Hospital Day: 2  Brief narrative: Ronald Duran is a 82 y.o. male with PMH significant for prediabetes, HTN, HLD, A-fib, stroke, CAD/stents, ischemic cardiomyopathy, PAD, prostate cancer, GERD, h/o esophageal strictures and dilatations, NSCLC who received Velcade and Daratumumab earlier this week at the cancer center. 8/24, patient presented to the ED with complaint of multiple episodes of loose stools in 24 hours  In the ED, patient was afebrile, heart rate in 90s, blood pressure in 130s, breathing on room air Initial labs with WBC count 7.7, hemoglobin 15.2, platelet 55, sodium 139, potassium 2.5 Urinalysis showed hazy amber-colored urine CT abdomen pelvis unremarkable for acute findings.  There was cholelithiasis without cholecystitis. Patient was given IV fluid, IV analgesics, antiemetics, IV potassium Admitted to [facility]  Subjective: Patient was seen and examined this morning.  Pleasant elderly Caucasian male.  Sitting up in recliner.  Not in distress.  Feels better.  Diarrhea improving in frequency and consistency.  Wife at bedside. Remains afebrile, hemodynamically stable Repeat labs this morning with WBC count 6.7, platelet 44, sodium 133, INR 2.7 Stool studies with C. difficile antigen, toxin and GIP negative  Assessment and plan: Acute diarrheal illness Started after few days of initiation of immunotherapy No evidence of infection, given absence of fever, leukocytosis or abnormal CT scan finding Stool studies unremarkable Continue IV hydration, supportive care Imodium as needed started. Diarrhea improving in terms of frequency and consistency.  Hypokalemia Potassium low at 2.5.  Improved with replacement. Recent Labs  Lab 03/27/23 1015 04/01/23 0846 04/02/23 0556  K 4.0 2.5* 4.4  MG  --  1.8  --    Prolonged QT interval Initial EKG with QTc 504  ms  Secondary to low electrolytes. Repeat EKG  Hyponatremia Likely due to diarrhea loss.  Gradually improving.  Continue to monitor Recent Labs  Lab 03/27/23 1015 04/01/23 0846 04/02/23 0556  NA 139 131* 132*   Permanent A-fib Continue metoprolol Continue Coumadin for anticoagulation Recent Labs  Lab 04/01/23 1648 04/02/23 0556  INR 2.6* 2.7*   CAD HLD On metoprolol, statin  CHADSVASC score 8.  Continue anticoagulation with Coumadin continue as needed nitroglycerin.  Chronic systolic CHF HTN No sign of decompensation at this time.  Chronic bilateral lower extremity edema stable Continue metoprolol continue to monitor blood pressure  Hiatal hernia/ Esophageal stricture Continue PPI   Multiple myeloma without remission  Following with Dr. Shirline Frees at the cancer center. Patient states he has scheduled follow-up with Dr. Arbutus Ped tomorrow morning. He also states that he takes dexamethasone 20 mg weekly on Monday morning.  I ordered for one dose tomorrow morning   Severe protein-calorie malnutrition In the setting of malignancy. Continue protein supplementation. Consider nutritional services evaluation.   Mobility: Encourage ambulation.  PT eval ordered  Goals of care   Code Status: Full Code     DVT prophylaxis:   warfarin (COUMADIN) tablet 5 mg  warfarin (COUMADIN) tablet 2.5 mg   Antimicrobials: Acyclovir as before Fluid: None Consultants: None Family Communication: Wife at bedside  Status: Observation Level of care:  Telemetry   Patient is from: Home Needs to continue in-hospital care: Continue to monitor for diarrhea frequency and severity Anticipated d/c to: May be home tomorrow   Diet:  Diet Order             Diet Heart Room service appropriate? Yes; Fluid consistency:  Thin  Diet effective now                   Scheduled Meds:  acyclovir  200 mg Oral BID   atorvastatin  40 mg Oral QHS   [START ON 04/03/2023] dexamethasone  20 mg  Oral Once   metoprolol tartrate  50 mg Oral BID    morphine injection  4 mg Intravenous Once   pantoprazole  40 mg Oral Daily   [START ON 04/03/2023] warfarin  2.5 mg Oral Q M,W,F-1800   warfarin  5 mg Oral Once per day on Sunday Tuesday Thursday Saturday   Warfarin - Pharmacist Dosing Inpatient   Does not apply q1600    PRN meds: acetaminophen **OR** acetaminophen, loperamide, nitroGLYCERIN, prochlorperazine   Infusions:    Antimicrobials: Anti-infectives (From admission, onward)    Start     Dose/Rate Route Frequency Ordered Stop   04/01/23 2200  acyclovir (ZOVIRAX) 200 MG capsule 200 mg        200 mg Oral 2 times daily 04/01/23 1541         Nutritional status:  Body mass index is 26.04 kg/m.          Objective: Vitals:   04/01/23 2310 04/02/23 0512  BP: (!) 122/95 (!) 133/97  Pulse: 95 93  Resp: 18 20  Temp: 97.7 F (36.5 C) 97.8 F (36.6 C)  SpO2: 96% 93%    Intake/Output Summary (Last 24 hours) at 04/02/2023 1207 Last data filed at 04/02/2023 0930 Gross per 24 hour  Intake 1273.28 ml  Output 250 ml  Net 1023.28 ml   Filed Weights   04/01/23 0753  Weight: 87.1 kg   Weight change:  Body mass index is 26.04 kg/m.   Physical Exam: General exam: Pleasant, elderly Caucasian male.  Not in distress Skin: No rashes, lesions or ulcers. HEENT: Atraumatic, normocephalic, no obvious bleeding Lungs: Clear to auscultation bilaterally CVS: Regular rate and rhythm, no murmur GI/Abd soft, nontender, nondistended, bowel sound present CNS: Alert, awake, oriented x 3 Psychiatry: Mood appropriate Extremities: Trace bilateral pedal edema, no calf tenderness  Data Review: I have personally reviewed the laboratory data and studies available.  F/u labs ordered Unresulted Labs (From admission, onward)     Start     Ordered   04/02/23 0500  Protime-INR  Daily,   R      08 /24/24 1524            Total time spent in review of labs and imaging, patient  evaluation, formulation of plan, documentation and communication with family: 55 minutes  Signed, Lorin Glass, MD [facility] 04/02/2023

## 2023-04-02 NOTE — TOC Initial Note (Addendum)
Transition of Care [facility]) - Initial/Assessment Note    Patient Details  Name: Ronald Duran MRN: [id number] Date of Birth: June 27, 1941  Transition of Care [facility]) CM/SW Contact:    Adrian Prows, RN Phone Number: 04/02/2023, 2:45 PM  Clinical Narrative:                 [facility] consult for d/c planning; spoke w/ pt and wife Ronald Duran in room; she is his POC; pt has glasses, cane, and walker; pt says if [facility] services required he would like to use [facility]; awaiting PT eval; TOC will follow.  Expected Discharge Plan: Home/Self Care Barriers to Discharge: Continued Medical Work up   Patient Goals and CMS Choice Patient states their goals for this hospitalization and ongoing recovery are:: yes          Expected Discharge Plan and Services   Discharge Planning Services: CM Consult   Living arrangements for the past 2 months: Single Family Home                                      Prior Living Arrangements/Services Living arrangements for the past 2 months: Single Family Home Lives with:: Spouse Patient language and need for interpreter reviewed:: Yes Do you feel safe going back to the place where you live?: Yes      Need for Family Participation in Patient Care: Yes (Comment) Care giver support system in place?: Yes (comment) Current home services: DME (cane, walker) Criminal Activity/Legal Involvement Pertinent to Current Situation/Hospitalization: No - Comment as needed  Activities of Daily Living Home Assistive Devices/Equipment: Cane (specify quad or straight), Walker (specify type) ADL Screening (condition at time of admission) Patient's cognitive ability adequate to safely complete daily activities?: Yes Is the patient deaf or have difficulty hearing?: No Does the patient have difficulty seeing, even when wearing glasses/contacts?: No Does the patient have difficulty concentrating, remembering, or making decisions?: No Patient able to express need for  assistance with ADLs?: Yes Does the patient have difficulty dressing or bathing?: No Independently performs ADLs?: Yes (appropriate for developmental age) Does the patient have difficulty walking or climbing stairs?: Yes Weakness of Legs: Both Weakness of Arms/Hands: None  Permission Sought/Granted Permission sought to share information with : Case Manager Permission granted to share information with : Yes, Verbal Permission Granted  Share Information with NAME: Case Manager     Permission granted to share info w Relationship: Ronald Duran (spouse) [phone]     Emotional Assessment Appearance:: Appears stated age Attitude/Demeanor/Rapport: Gracious Affect (typically observed): Accepting Orientation: : Oriented to Self, Oriented to Place, Oriented to  Time, Oriented to Situation Alcohol / Substance Use: Not Applicable Psych Involvement: No (comment)  Admission diagnosis:  Dehydration [E86.0] Hypokalemia [E87.6] Diarrhea, unspecified type [R19.7] Patient Active Problem List   Diagnosis Date Noted   Hypokalemia 04/01/2023   Prolonged QT interval 04/01/2023   Protein-calorie malnutrition, severe (HCC) 04/01/2023   Enteritis 04/01/2023   Allergic rhinitis 04/01/2023   Polyp of colon 04/01/2023   Multiple myeloma (HCC) 03/06/2023   Chronic atrial fibrillation with RVR (HCC) 01/24/2023   Cerebral infarction (HCC) 01/24/2023   H/O Malignant melanoma 01/24/2023   CAP (community acquired pneumonia) 01/24/2023   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   Multiple myeloma without remission (HCC) 11/23/2022   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) 10/25/2022   Impending pathologic fracture 10/25/2022  Lung nodule 10/21/2022   Metastasis to bone of unknown primary (HCC) 10/12/2022   Atrial fibrillation (HCC) 05/12/2022   Prostate cancer (HCC)    PAD (peripheral artery disease) (HCC)    Dyslipidemia    Cardiomyopathy (HCC)    CAD (coronary artery  disease)    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/2017   Abnormal involuntary movement 09/13/2016   Bronchitis 09/13/2016   Esophageal stricture 09/13/2016   History of adenomatous polyp of colon 09/13/2016   Inflamed seborrheic keratosis 09/13/2016   Long term (current) use of anticoagulants 09/13/2016   Old myocardial infarction 09/13/2016   Rash 09/13/2016   Benign essential HTN 09/08/2015   Erectile dysfunction    Essential hypertension    Malignant tumor of prostate (HCC)    Hiatal hernia    Chronic systolic heart failure (HCC)    PCP:  Farris Has, MD Pharmacy:   [facility] [place], [place] - [address] [place] [address] Phone: [phone] Fax: [phone]     Social Determinants of Health (SDOH) Social History: SDOH Screenings   Food Insecurity: No Food Insecurity (04/02/2023)  Housing: Low Risk  (04/02/2023)  Transportation Needs: No Transportation Needs (04/02/2023)  Recent Concern: Transportation Needs - Unmet Transportation Needs (01/24/2023)  Utilities: Not At Risk (04/02/2023)  Depression (PHQ2-9): Low Risk  (10/12/2022)  Tobacco Use: Low Risk  (04/01/2023)   SDOH Interventions: Food Insecurity Interventions: Intervention Not Indicated, Inpatient TOC Housing Interventions: Intervention Not Indicated, Inpatient TOC Transportation Interventions: Intervention Not Indicated, Inpatient TOC Utilities Interventions: Intervention Not Indicated, Inpatient TOC   Readmission Risk Interventions    01/25/2023    2:03 PM  Readmission Risk Prevention Plan  PCP or Specialist Appt within 3-5 Days Complete

## 2023-04-02 NOTE — Progress Notes (Signed)
ANTICOAGULATION CONSULT NOTE - Follow Up Consult  Pharmacy Consult for Warfarin Indication:  h/o afib and CVA  No Known Allergies  Patient Measurements: Height: 6' (182.9 cm) Weight: 87.1 kg (192 lb) IBW/kg (Calculated) : 77.6  Vital Signs: Temp: 97.8 F (36.6 C) (08/25 0512) Temp Source: Oral (08/25 0512) BP: 133/97 (08/25 0512) Pulse Rate: 93 (08/25 0512)  Labs: Recent Labs    04/01/23 0805 04/01/23 0846 04/01/23 1648 04/02/23 0556  HGB 15.2  --   --  13.5  HCT 45.7  --   --  40.9  PLT 55*  --   --  44*  LABPROT  --   --  27.9* 29.1*  INR  --   --  2.6* 2.7*  CREATININE  --  0.74  --  0.83    Estimated Creatinine Clearance: 79.2 mL/min (by C-G formula based on SCr of 0.83 mg/dL).   Assessment:  AC/Heme: Warfarin PTA for h/o afib and CVA - Hgb WNL, Plts 55>>44.  INR 2.7  - PTA dose: 5mg  x 4d weekly, 2.5mg  x 3 days weekly  Goal of Therapy:  INR 2-3 Monitor platelets by anticoagulation protocol: Yes   Plan:  Will schedule Warfarin as PTA 2.5mg  MWF 5mg  TTSS Daily INR while inpatient.   Acel Natzke S. Merilynn Finland, PharmD, BCPS Clinical Staff Pharmacist [email or web address] Merilynn Finland, [employer] 04/02/2023,8:53 AM

## 2023-04-02 NOTE — Evaluation (Signed)
Physical Therapy Evaluation Patient Details Name: Ronald Duran MRN: [id number] DOB: 1940-08-17 Today's Date: 04/02/2023  History of Present Illness  82 yo male admitted with enteritis, hypokalemia. Hx of CVA, multiple myeloma, hernia repair, s/p prophylactic fixaiton of L femoral neck lytic lesion 10/2022, COVID, cardiomyopathy, Afib, MI, PAD  Clinical Impression  On eval, pt was Supv-Mod Ind with mobility. He walked ~150 feet with a RW. Tolerated activity well. Pt reports, at baseline, he is usually able to walk quite a bit further with use of his rollator. He is agreeable to HHPT f/u.         If plan is discharge home, recommend the following: Assistance with cooking/housework;Assist for transportation;Help with stairs or ramp for entrance   Can travel by private vehicle        Equipment Recommendations None recommended by PT  Recommendations for Other Services       Functional Status Assessment Patient has had a recent decline in their functional status and demonstrates the ability to make significant improvements in function in a reasonable and predictable amount of time.     Precautions / Restrictions Precautions Precautions: Fall Restrictions Weight Bearing Restrictions: No      Mobility  Bed Mobility Overal bed mobility: Modified Independent                  Transfers Overall transfer level: Modified independent                      Ambulation/Gait Ambulation/Gait assistance: Supervision Gait Distance (Feet): 150 Feet Assistive device: Rolling walker (2 wheels) Gait Pattern/deviations: Step-through pattern, Decreased stride length       General Gait Details: Supv for safety. No LOB with RW. Tolerated distance well  Stairs            Wheelchair Mobility     Tilt Bed    Modified Rankin (Stroke Patients Only)       Balance Overall balance assessment: Needs assistance         Standing balance support: Bilateral upper  extremity supported, Reliant on assistive device for balance, During functional activity                                 Pertinent Vitals/Pain Pain Assessment Pain Assessment: No/denies pain    Home Living Family/patient expects to be discharged to:: Private residence Living Arrangements: Spouse/significant other Available Help at Discharge: Family Type of Home: House Home Access: Stairs to enter Entrance Stairs-Rails: Doctor, general practice of Steps: 3     Home Equipment: Engineer, materials - single point      Prior Function Prior Level of Function : Independent/Modified Independent                     Extremity/Trunk Assessment   Upper Extremity Assessment Upper Extremity Assessment: Overall WFL for tasks assessed (neuropathy)    Lower Extremity Assessment Lower Extremity Assessment: Generalized weakness (neuropathy)    Cervical / Trunk Assessment Cervical / Trunk Assessment: Normal  Communication   Communication Communication: No apparent difficulties  Cognition Arousal: Alert Behavior During Therapy: WFL for tasks assessed/performed Overall Cognitive Status: Within Functional Limits for tasks assessed  General Comments      Exercises     Assessment/Plan    PT Assessment Patient needs continued PT services  PT Problem List Decreased strength;Decreased activity tolerance;Decreased balance;Decreased mobility       PT Treatment Interventions DME instruction;Gait training;Functional mobility training;Therapeutic activities;Therapeutic exercise;Patient/family education;Balance training    PT Goals (Current goals can be found in the Care Plan section)  Acute Rehab PT Goals Patient Stated Goal: regain activity level-walking more, golf PT Goal Formulation: With patient/family Time For Goal Achievement: 04/16/23 Potential to Achieve Goals: Good    Frequency Min  1X/week     Co-evaluation               AM-PAC PT "6 Clicks" Mobility  Outcome Measure Help needed turning from your back to your side while in a flat bed without using bedrails?: None Help needed moving from lying on your back to sitting on the side of a flat bed without using bedrails?: None Help needed moving to and from a bed to a chair (including a wheelchair)?: None Help needed standing up from a chair using your arms (e.g., wheelchair or bedside chair)?: None Help needed to walk in hospital room?: A Little Help needed climbing 3-5 steps with a railing? : A Little 6 Click Score: 22    End of Session Equipment Utilized During Treatment: Gait belt Activity Tolerance: Patient tolerated treatment well Patient left: in bed;with call bell/phone within reach;with bed alarm set;with family/visitor present   PT Visit Diagnosis: Unsteadiness on feet (R26.81);Muscle weakness (generalized) (M62.81)    Time: 4098-1191 PT Time Calculation (min) (ACUTE ONLY): 23 min   Charges:   PT Evaluation $PT Eval Low Complexity: 1 Low   PT General Charges $$ ACUTE PT VISIT: 1 Visit          Faye Ramsay, PT Acute Rehabilitation  Office: [phone]

## 2023-04-02 NOTE — Care Management Obs Status (Signed)
MEDICARE OBSERVATION STATUS NOTIFICATION   Patient Details  Name: Ronald Duran MRN: [id number] Date of Birth: November 14, 1940   Medicare Observation Status Notification Given:  Yes    Adrian Prows, RN 04/02/2023, 4:48 PM

## 2023-04-03 ENCOUNTER — Other Ambulatory Visit: Payer: Self-pay

## 2023-04-03 ENCOUNTER — Inpatient Hospital Stay: Payer: Medicare Other

## 2023-04-03 ENCOUNTER — Inpatient Hospital Stay: Payer: Medicare Other | Admitting: Internal Medicine

## 2023-04-03 ENCOUNTER — Other Ambulatory Visit: Payer: Self-pay | Admitting: Internal Medicine

## 2023-04-03 DIAGNOSIS — Z1152 Encounter for screening for COVID-19: Secondary | ICD-10-CM | POA: Diagnosis not present

## 2023-04-03 DIAGNOSIS — E871 Hypo-osmolality and hyponatremia: Secondary | ICD-10-CM | POA: Diagnosis not present

## 2023-04-03 DIAGNOSIS — R197 Diarrhea, unspecified: Secondary | ICD-10-CM | POA: Diagnosis not present

## 2023-04-03 DIAGNOSIS — E876 Hypokalemia: Secondary | ICD-10-CM | POA: Diagnosis not present

## 2023-04-03 LAB — BASIC METABOLIC PANEL
Anion gap: 4 — ABNORMAL LOW (ref 5–15)
BUN: 17 mg/dL (ref 8–23)
CO2: 20 mmol/L — ABNORMAL LOW (ref 22–32)
Calcium: 7.6 mg/dL — ABNORMAL LOW (ref 8.9–10.3)
Chloride: 108 mmol/L (ref 98–111)
Creatinine, Ser: 0.73 mg/dL (ref 0.61–1.24)
GFR, Estimated: >60 mL/min (ref >60)
Glucose, Bld: 94 mg/dL (ref 70–99)
Potassium: 3.7 mmol/L (ref 3.5–5.1)
Sodium: 132 mmol/L — ABNORMAL LOW (ref 135–145)

## 2023-04-03 LAB — CBC WITH DIFFERENTIAL/PLATELET
Abs Immature Granulocytes: 0.11 10*3/uL — ABNORMAL HIGH (ref 0.00–0.07)
Basophils Absolute: 0 10*3/uL (ref 0.0–0.1)
Basophils Relative: 0 %
Eosinophils Absolute: 0.1 10*3/uL (ref 0.0–0.5)
Eosinophils Relative: 1 %
HCT: 38.4 % — ABNORMAL LOW (ref 39.0–52.0)
Hemoglobin: 13 g/dL (ref 13.0–17.0)
Immature Granulocytes: 1 %
Lymphocytes Relative: 6 %
Lymphs Abs: 0.5 10*3/uL — ABNORMAL LOW (ref 0.7–4.0)
MCH: 34.4 pg — ABNORMAL HIGH (ref 26.0–34.0)
MCHC: 33.9 g/dL (ref 30.0–36.0)
MCV: 101.6 fL — ABNORMAL HIGH (ref 80.0–100.0)
Monocytes Absolute: 1 10*3/uL (ref 0.1–1.0)
Monocytes Relative: 12 %
Neutro Abs: 6.5 10*3/uL (ref 1.7–7.7)
Neutrophils Relative %: 80 %
Platelets: 28 10*3/uL — CL (ref 150–400)
RBC: 3.78 MIL/uL — ABNORMAL LOW (ref 4.22–5.81)
RDW: 16.2 % — ABNORMAL HIGH (ref 11.5–15.5)
WBC: 8.1 10*3/uL (ref 4.0–10.5)
nRBC: 0.4 % — ABNORMAL HIGH (ref 0.0–0.2)

## 2023-04-03 LAB — PROTIME-INR
INR: 3.2 — ABNORMAL HIGH (ref 0.8–1.2)
Prothrombin Time: 32.9 s — ABNORMAL HIGH (ref 11.4–15.2)

## 2023-04-03 MED ORDER — LOPERAMIDE HCL 2 MG PO CAPS: 2.0000 mg | ORAL_CAPSULE | Freq: Four times a day (QID) | ORAL | 0 refills | Status: DC | PRN

## 2023-04-03 NOTE — TOC Transition Note (Signed)
Transition of Care [facility]) - CM/SW Discharge Note   Patient Details  Name: Ronald Duran MRN: [id number] Date of Birth: August 16, 1940  Transition of Care [facility]) CM/SW Contact:  Lanier Clam, RN Phone Number: 04/03/2023, 12:40 PM   Clinical Narrative: Frances Furbish HHPT-rep [facility] aware. No further CM needs.        Barriers to Discharge: No Barriers Identified   Patient Goals and CMS Choice      Discharge Placement                         Discharge Plan and Services Additional resources added to the After Visit Summary for     Discharge Planning Services: CM Consult                      HH Arranged: PT HH Agency: [facility] Date [facility] Agency Contacted: 04/03/23 Time HH Agency Contacted: 1239 Representative spoke with at [facility] Agency: Kandee Keen  Social Determinants of Health (SDOH) Interventions SDOH Screenings   Food Insecurity: No Food Insecurity (04/02/2023)  Housing: Low Risk  (04/02/2023)  Transportation Needs: No Transportation Needs (04/02/2023)  Recent Concern: Transportation Needs - Unmet Transportation Needs (01/24/2023)  Utilities: Not At Risk (04/02/2023)  Depression (PHQ2-9): Low Risk  (10/12/2022)  Tobacco Use: Low Risk  (04/01/2023)     Readmission Risk Interventions    01/25/2023    2:03 PM  Readmission Risk Prevention Plan  PCP or Specialist Appt within 3-5 Days Complete

## 2023-04-03 NOTE — Progress Notes (Signed)
ANTICOAGULATION CONSULT NOTE - Follow Up Consult  Pharmacy Consult for Warfarin Indication:  h/o afib and CVA  No Known Allergies  Patient Measurements: Height: 6' (182.9 cm) Weight: 87.1 kg (192 lb) IBW/kg (Calculated) : 77.6  Vital Signs: Temp: 98.2 F (36.8 C) (08/26 0518) Temp Source: Oral (08/26 0518) BP: 125/87 (08/26 0518) Pulse Rate: 101 (08/26 0518)  Labs: Recent Labs    04/01/23 0805 04/01/23 0846 04/01/23 1648 04/02/23 0556 04/03/23 0557  HGB 15.2  --   --  13.5 13.0  HCT 45.7  --   --  40.9 38.4*  PLT 55*  --   --  44* 28*  LABPROT  --   --  27.9* 29.1* 32.9*  INR  --   --  2.6* 2.7* 3.2*  CREATININE  --  0.74  --  0.83 0.73    Estimated Creatinine Clearance: 82.2 mL/min (by C-G formula based on SCr of 0.73 mg/dL).   Assessment:  AC/Heme: Warfarin PTA for h/o afib and CVA  Today, 04/03/23  - INR = 3.2, supratherapeutic - Plt: 28, low- trending down (55>>44>>28) - Hgb =13 WNL  - PTA dose: 2.5mg  on Mon, Wed, Fri and 5mg  on all other days.  Goal of Therapy:  INR 2-3 Monitor platelets by anticoagulation protocol: Yes   Plan:  - Hold warfarin today. - Daily INR & CBC while inpatient. - Monitor for signs/symptoms of bleeding. - No bleeding noted.  Tacy Learn, PharmD Clinical Pharmacist 04/03/2023 12:23 PM

## 2023-04-03 NOTE — Discharge Summary (Signed)
Physician Discharge Summary  Ronald Duran ZOX:[id number] DOB: 09-02-1940 DOA: 04/01/2023  PCP: Farris Has, MD  Admit date: 04/01/2023 Discharge date: 04/03/2023  Admitted From: home Discharge disposition: home with [facility] PT  Recommendations at discharge:  Continue morning as needed Follow-up with oncologist as an outpatient    Brief narrative: Ronald Duran is a 82 y.o. male with PMH significant for prediabetes, HTN, HLD, A-fib, stroke, CAD/stents, ischemic cardiomyopathy, PAD, prostate cancer, GERD, h/o esophageal strictures and dilatations, NSCLC who received Velcade and Daratumumab earlier this week at the cancer center. 8/24, patient presented to the ED with complaint of multiple episodes of loose stools in 24 hours  In the ED, patient was afebrile, heart rate in 90s, blood pressure in 130s, breathing on room air Initial labs with WBC count 7.7, hemoglobin 15.2, platelet 55, sodium 139, potassium 2.5 Urinalysis showed hazy amber-colored urine CT abdomen pelvis unremarkable for acute findings.  There was cholelithiasis without cholecystitis. Patient was given IV fluid, IV analgesics, antiemetics, IV potassium Admitted to [facility]  Subjective: Patient was seen and examined this morning.  Pleasant elderly Caucasian male.  Sitting up in recliner.  Not in distress.  Feels better.   Diarrhea improved.  Wife at bedside.  Hospital course: Acute diarrheal illness Started after few days of initiation of immunotherapy No evidence of infection, given absence of fever, leukocytosis or abnormal CT scan finding Stool studies unremarkable Diarrhea improved with Imodium as needed. Adequately hydrated.  Hypokalemia Potassium low at 2.5.  Improved with replacement. Recent Labs  Lab 04/01/23 0846 04/02/23 0556 04/03/23 0557  K 2.5* 4.4 3.7  MG 1.8  --   --    Prolonged QT interval Initial EKG with QTc 504 ms  Secondary to low electrolytes. Repeat EKG today showed an improvement  to 477 ms.  Hyponatremia Likely due to diarrhea loss.  Gradually improving.  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  Given this morning. Follows up with Dr. Shirline Frees at the cancer center. He had a previously scheduled follow-up with Dr. Arbutus Ped this morning.  I discussed with Dr. Arbutus Ped this morning.  It has been rescheduled.   Thrombocytopenia Platelet level tends to dip down with immunotherapy.  No active bleeding.  Discussed with oncologist.  To be monitored as an outpatient. Recent Labs  Lab 04/01/23 0805 04/02/23 0556 04/03/23 0557  PLT 55* 44* 28*    Permanent A-fib Continue metoprolol Continue Coumadin for anticoagulation Recent Labs  Lab 04/01/23 1648 04/02/23 0556 04/03/23 0557  INR 2.6* 2.7* 3.2*   CAD HLD On metoprolol, statin  CHADSVASC score 8.  Continue anticoagulation with Coumadin continue as needed nitroglycerin.  Chronic systolic CHF HTN No sign of decompensation at this time.  Chronic bilateral lower extremity edema stable Continue metoprolol continue to monitor blood pressure  Hiatal hernia/ Esophageal stricture Continue PPI   Severe protein-calorie malnutrition In the setting of malignancy. Continue protein supplementation. Consider nutritional services evaluation.  Impaired mobility  Seen by PT.  Home health PT recommended  Goals of care   Code Status: Full Code   Wounds:  - Wound / Incision (Open or Dehisced) 10/11/22 Puncture Vertebral column Posterior Kyphoplasty T10 (Active)  Date First Assessed/Time First Assessed: 10/11/22 1000   Wound Type: Puncture  Location: Vertebral column  Location Orientation: Posterior  Wound Description (Comments): Kyphoplasty T10  Present  on Admission: No    Assessments 10/11/2022 10:10 AM  Dressing Type Gauze (Comment);Transparent dressing   Dressing Changed New  Dressing Status Clean, Dry, Intact  Site / Wound Assessment Clean;Dry     No associated orders.     Wound / Incision (Open or Dehisced) 04/01/23 Irritant Dermatitis (Moisture Associated Skin Damage) Groin Right;Left (Active)  Date First Assessed/Time First Assessed: 04/01/23 1537   Wound Type: Irritant Dermatitis (Moisture Associated Skin Damage)  Location: Groin  Location Orientation: Right;Left  Present on Admission: Yes    Assessments 04/01/2023  3:31 PM 04/02/2023  8:25 PM  Dressing Type Moisture barrier Moisture barrier  Dressing Status None --  Site / Wound Assessment Clean;Dry;Red;Pink --  Peri-wound Assessment Intact --  Margins Attached edges (approximated) --  Closure None --  Drainage Amount None --  Treatment Cleansed;Other (Comment) --     No associated orders.    Discharge Exam:   Vitals:   04/02/23 0512 04/02/23 1312 04/02/23 2108 04/03/23 0518  BP: (!) 133/97 120/86 103/71 125/87  Pulse: 93 92 62 (!) 101  Resp: 20 18 17 16   Temp: 97.8 F (36.6 C) 97.6 F (36.4 C) 98.2 F (36.8 C) 98.2 F (36.8 C)  TempSrc: Oral Oral Oral Oral  SpO2: 93% 94% 95% 96%  Weight:      Height:        Body mass index is 26.04 kg/m.   General exam: Pleasant, elderly Caucasian male.  Not in distress Skin: No rashes, lesions or ulcers. HEENT: Atraumatic, normocephalic, no obvious bleeding Lungs: Clear to auscultation bilaterally CVS: Regular rate and rhythm, no murmur GI/Abd soft, nontender, nondistended, bowel sound present CNS: Alert, awake, oriented x 3 Psychiatry: Mood appropriate Extremities: Trace bilateral pedal edema, no calf tenderness  Follow ups:    Follow-up Information     Farris Has, MD Follow up.   Specialty: Family Medicine Contact information: [address] [place] [address] [phone]                 Discharge Instructions:   Discharge Instructions     Call MD for:  difficulty  breathing, headache or visual disturbances   Complete by: As directed    Call MD for:  extreme fatigue   Complete by: As directed    Call MD for:  hives   Complete by: As directed    Call MD for:  persistant dizziness or light-headedness   Complete by: As directed    Call MD for:  persistant nausea and vomiting   Complete by: As directed    Call MD for:  severe uncontrolled pain   Complete by: As directed    Call MD for:  temperature >100.4   Complete by: As directed    Diet general   Complete by: As directed    Discharge instructions   Complete by: As directed    Recommendations at discharge:   Continue morning as needed  Follow-up with oncologist as an outpatient  General discharge instructions: Follow with Primary MD Farris Has, MD in 7 days  Please request your PCP  to go over your hospital tests, procedures, radiology results at the follow up. Please get your medicines reviewed and adjusted.  Your PCP may decide to repeat certain labs or tests as needed. Do not drive, operate heavy machinery, perform activities at heights, swimming or participation in water activities or provide baby sitting services if your were admitted for syncope or siezures until you have seen  by Primary MD or a Neurologist and advised to do so again. [place] Controlled Substance Reporting System database was reviewed. Do not drive, operate heavy machinery, perform activities at heights, swim, participate in water activities or provide baby-sitting services while on medications for pain, sleep and mood until your outpatient physician has reevaluated you and advised to do so again.  You are strongly recommended to comply with the dose, frequency and duration of prescribed medications. Activity: As tolerated with Full fall precautions use walker/cane & assistance as needed Avoid using any recreational substances like cigarette, tobacco, alcohol, or non-prescribed drug. If you experience worsening of  your admission symptoms, develop shortness of breath, life threatening emergency, suicidal or homicidal thoughts you must seek medical attention immediately by calling 911 or calling your MD immediately  if symptoms less severe. You must read complete instructions/literature along with all the possible adverse reactions/side effects for all the medicines you take and that have been prescribed to you. Take any new medicine only after you have completely understood and accepted all the possible adverse reactions/side effects.  Wear Seat belts while driving. You were cared for by a hospitalist during your hospital stay. If you have any questions about your discharge medications or the care you received while you were in the hospital after you are discharged, you can call the unit and ask to speak with the hospitalist or the covering physician. Once you are discharged, your primary care physician will handle any further medical issues. Please note that NO REFILLS for any discharge medications will be authorized once you are discharged, as it is imperative that you return to your primary care physician (or establish a relationship with a primary care physician if you do not have one).   Discharge wound care:   Complete by: As directed    Increase activity slowly   Complete by: As directed        Discharge Medications:   Allergies as of 04/03/2023   No Known Allergies      Medication List     TAKE these medications    acyclovir 200 MG capsule Commonly known as: ZOVIRAX Take 1 capsule (200 mg total) by mouth 2 (two) times daily.   atorvastatin 40 MG tablet Commonly known as: LIPITOR Take 40 mg by mouth at bedtime.   B Complex 100 tablet Take 1 tablet by mouth daily. Balance   Centrum tablet Take 1 tablet by mouth daily.   cetirizine 10 MG tablet Commonly known as: ZyrTEC Allergy Take 1 tablet (10 mg total) by mouth daily.   Co Q-10 100 MG Caps Take 100 mg by mouth daily with  supper.   dexamethasone 4 MG tablet Commonly known as: DECADRON 5 tab weekly start with the first dose of chemotherapy. What changed:  how much to take how to take this when to take this additional instructions   ketoconazole 2 % cream Commonly known as: NIZORAL Apply 1 Application topically daily as needed for irritation.   lenalidomide 15 MG capsule Commonly known as: REVLIMID Take 1 capsule (15 mg total) by mouth daily. Take for 14 days, then none for 7 days. Repeat every 21 days. Start Revlimid on day 1 of chemotherapy Auth #[id number] date obtained 12/19/2022 Adult Male.   loperamide 2 MG capsule Commonly known as: IMODIUM Take 1 capsule (2 mg total) by mouth every 6 (six) hours as needed for diarrhea or loose stools.   metoprolol tartrate 50 MG tablet Commonly known as: LOPRESSOR Take 1 tablet (  50 mg total) by mouth 2 (two) times daily.   nitroGLYCERIN 0.4 MG SL tablet Commonly known as: NITROSTAT PLACE 1 TABLET UNDER THE TONGUE IF NEEDED EVERY 5 MINUTES FOR CHEST PAIN FOR 3 DOSES IF NO RELIEF AFTER FIRST DOSE CALL 911. What changed:  how much to take how to take this when to take this reasons to take this additional instructions   omeprazole 20 MG capsule Commonly known as: PRILOSEC Take 20 mg by mouth daily before breakfast.   prochlorperazine 10 MG tablet Commonly known as: COMPAZINE Take 1 tablet (10 mg total) by mouth every 6 (six) hours as needed for nausea or vomiting.   traMADol 50 MG tablet Commonly known as: ULTRAM Take 1 tablet (50 mg total) by mouth every 12 (twelve) hours as needed. What changed: reasons to take this   triamcinolone ointment 0.5 % Commonly known as: KENALOG Apply 1 Application topically 2 (two) times daily. What changed:  when to take this reasons to take this   Vitamin D3 50 MCG (2000 UT) Tabs Take 2,000 Units by mouth daily.   warfarin 5 MG tablet Commonly known as: COUMADIN Take as directed. If you are unsure how to  take this medication, talk to your nurse or doctor. Original instructions: TAKE 1/2 TO 1 TABLET BY MOUTH AS DIRECTED BY COUMADIN CLINIC. What changed:  how much to take how to take this when to take this additional instructions               Discharge Care Instructions  (From admission, onward)           Start     Ordered   04/03/23 0000  Discharge wound care:        04/03/23 1206             The results of significant diagnostics from this hospitalization (including imaging, microbiology, ancillary and laboratory) are listed below for reference.    Procedures and Diagnostic Studies:   CT ABDOMEN PELVIS W CONTRAST  Result Date: 04/01/2023 CLINICAL DATA:  Acute abdominal pain. Diarrhea. Multiple myeloma and prostate carcinoma. * Tracking Code: BO * EXAM: CT ABDOMEN AND PELVIS WITH CONTRAST TECHNIQUE: Multidetector CT imaging of the abdomen and pelvis was performed using the standard protocol following bolus administration of intravenous contrast. RADIATION DOSE REDUCTION: This exam was performed according to the departmental dose-optimization program which includes automated exposure control, adjustment of the mA and/or kV according to patient size and/or use of iterative reconstruction technique. CONTRAST:  OMNIPAQUE IOHEXOL 300 MG/ML  SOLN COMPARISON:  10/20/2009 FINDINGS: Lower Chest: Small bilateral pleural effusions and mild bibasilar atelectasis. Hepatobiliary: No suspicious hepatic masses identified. Stable tiny left hepatic lobe cyst. Few tiny gallstones are seen, however there is no evidence of cholecystitis or biliary ductal dilatation. Pancreas:  No mass or inflammatory changes. Spleen: Within normal limits in size and appearance. Adrenals/Urinary Tract: No suspicious masses identified. No evidence of ureteral calculi or hydronephrosis. Stomach/Bowel: No evidence of obstruction, inflammatory process or abnormal fluid collections. Vascular/Lymphatic: No  pathologically enlarged lymph nodes. No acute vascular findings. Reproductive:  Prostate brachytherapy seeds noted. Other:  None. Musculoskeletal: No suspicious bone lesions identified. Left hip prosthesis and previous lower thoracic vertebroplasty noted. IMPRESSION: No acute findings within abdomen or pelvis. Cholelithiasis. No radiographic evidence of cholecystitis. Small bilateral pleural effusions and mild bibasilar atelectasis. Electronically Signed   By: Danae Orleans M.D.   On: 04/01/2023 11:00     Labs:   Basic Metabolic Panel: Recent  Labs  Lab 04/01/23 0846 04/02/23 0556 04/03/23 0557  NA 131* 132* 132*  K 2.5* 4.4 3.7  CL 108 109 108  CO2 17* 18* 20*  GLUCOSE 103* 98 94  BUN 20 20 17   CREATININE 0.74 0.83 0.73  CALCIUM 7.1* 7.9* 7.6*  MG 1.8  --   --    GFR Estimated Creatinine Clearance: 82.2 mL/min (by C-G formula based on SCr of 0.73 mg/dL). Liver Function Tests: Recent Labs  Lab 04/01/23 0846 04/02/23 0556  AST 19 22  ALT 25 26  ALKPHOS 54 60  BILITOT 1.0 1.1  PROT 4.2* 4.9*  ALBUMIN 2.4* 3.0*   Recent Labs  Lab 04/01/23 0846  LIPASE 29   No results for input(s): "AMMONIA" in the last 168 hours. Coagulation profile Recent Labs  Lab 04/01/23 1648 04/02/23 0556 04/03/23 0557  INR 2.6* 2.7* 3.2*    CBC: Recent Labs  Lab 04/01/23 0805 04/02/23 0556 04/03/23 0557  WBC 7.7 6.7 8.1  NEUTROABS 6.3  --  6.5  HGB 15.2 13.5 13.0  HCT 45.7 40.9 38.4*  MCV 103.2* 102.3* 101.6*  PLT 55* 44* 28*   Cardiac Enzymes: No results for input(s): "CKTOTAL", "CKMB", "CKMBINDEX", "TROPONINI" in the last 168 hours. BNP: Invalid input(s): "POCBNP" CBG: No results for input(s): "GLUCAP" in the last 168 hours. D-Dimer No results for input(s): "DDIMER" in the last 72 hours. Hgb A1c No results for input(s): "HGBA1C" in the last 72 hours. Lipid Profile No results for input(s): "CHOL", "HDL", "LDLCALC", "TRIG", "CHOLHDL", "LDLDIRECT" in the last 72  hours. Thyroid function studies No results for input(s): "TSH", "T4TOTAL", "T3FREE", "THYROIDAB" in the last 72 hours.  Invalid input(s): "FREET3" Anemia work up No results for input(s): "VITAMINB12", "FOLATE", "FERRITIN", "TIBC", "IRON", "RETICCTPCT" in the last 72 hours. Microbiology Recent Results (from the past 240 hour(s))  Gastrointestinal Panel by PCR , Stool     Status: None   Collection Time: 04/01/23 11:10 AM   Specimen: STOOL  Result Value Ref Range Status   Campylobacter species NOT DETECTED NOT DETECTED Final   Plesimonas shigelloides NOT DETECTED NOT DETECTED Final   Salmonella species NOT DETECTED NOT DETECTED Final   Yersinia enterocolitica NOT DETECTED NOT DETECTED Final   Vibrio species NOT DETECTED NOT DETECTED Final   Vibrio cholerae NOT DETECTED NOT DETECTED Final   Enteroaggregative E coli (EAEC) NOT DETECTED NOT DETECTED Final   Enteropathogenic E coli (EPEC) NOT DETECTED NOT DETECTED Final   Enterotoxigenic E coli (ETEC) NOT DETECTED NOT DETECTED Final   Shiga like toxin producing E coli (STEC) NOT DETECTED NOT DETECTED Final   Shigella/Enteroinvasive E coli (EIEC) NOT DETECTED NOT DETECTED Final   Cryptosporidium NOT DETECTED NOT DETECTED Final   Cyclospora cayetanensis NOT DETECTED NOT DETECTED Final   Entamoeba histolytica NOT DETECTED NOT DETECTED Final   Giardia lamblia NOT DETECTED NOT DETECTED Final   Adenovirus F40/41 NOT DETECTED NOT DETECTED Final   Astrovirus NOT DETECTED NOT DETECTED Final   Norovirus GI/GII NOT DETECTED NOT DETECTED Final   Rotavirus A NOT DETECTED NOT DETECTED Final   Sapovirus (I, II, IV, and V) NOT DETECTED NOT DETECTED Final    Comment: Performed at [facility], [address] [place], [place] [address]  C Difficile Quick Screen w PCR reflex     Status: None   Collection Time: 04/01/23 11:10 AM   Specimen: STOOL  Result Value Ref Range Status   C Diff antigen NEGATIVE NEGATIVE Final   C Diff toxin  NEGATIVE  NEGATIVE Final   C Diff interpretation No C. difficile detected.  Final    Comment: Performed at [facility], [address] [place], [place] [address]  SARS Coronavirus 2 by RT PCR (hospital order, performed in [facility] hospital lab) *cepheid single result test* Anterior Nasal Swab     Status: None   Collection Time: 04/01/23 11:31 AM   Specimen: Anterior Nasal Swab  Result Value Ref Range Status   SARS Coronavirus 2 by RT PCR NEGATIVE NEGATIVE Final    Comment: (NOTE) SARS-CoV-2 target nucleic acids are NOT DETECTED.  The SARS-CoV-2 RNA is generally detectable in upper and lower respiratory specimens during the acute phase of infection. The lowest concentration of SARS-CoV-2 viral copies this assay can detect is 250 copies / mL. A negative result does not preclude SARS-CoV-2 infection and should not be used as the sole basis for treatment or other patient management decisions.  A negative result may occur with improper specimen collection / handling, submission of specimen other than nasopharyngeal swab, presence of viral mutation(s) within the areas targeted by this assay, and inadequate number of viral copies (<250 copies / mL). A negative result must be combined with clinical observations, patient history, and epidemiological information.  Fact Sheet for Patients:   RoadLapTop.co.za  Fact Sheet for Healthcare Providers: http://kim-miller.com/  This test is not yet approved or  cleared by the [place] FDA and has been authorized for detection and/or diagnosis of SARS-CoV-2 by FDA under an Emergency Use Authorization (EUA).  This EUA will remain in effect (meaning this test can be used) for the duration of the COVID-19 declaration under Section 564(b)(1) of the Act, 21 U.S.C. section 360bbb-3(b)(1), unless the authorization is terminated or revoked sooner.  Performed at [facility],  [address] [place], [place] [address]     Time coordinating discharge: 45 minutes  Signed: Melina Schools Loden Laurent  [facility] 04/03/2023, 12:06 PM

## 2023-04-06 ENCOUNTER — Ambulatory Visit: Payer: Medicare Other

## 2023-04-07 ENCOUNTER — Telehealth: Payer: Self-pay | Admitting: Internal Medicine

## 2023-04-07 NOTE — Telephone Encounter (Signed)
I spoke with patient who was recently hospitalized with diarrhea and recorded an INR of 3.2.  He held his 1 tablet of coumadin on 8/29.  He will resume greens and Coumadin until his visit with [place] on 9/3.  He verbalized understanding.

## 2023-04-07 NOTE — Telephone Encounter (Signed)
  Pt c/o medication issue:  1. Name of Medication: warfarin (COUMADIN) 5 MG tablet   2. How are you currently taking this medication (dosage and times per day)?  TAKE 1/2 TO 1 TABLET BY MOUTH AS DIRECTED BY COUMADIN CLINIC.Patient taking differently: Take 2.5-5 mg by mouth daily. 4 days - 1 tablet and 3 days - 1/2 tablet     3. Are you having a reaction (difficulty breathing--STAT)? No  4. What is your medication issue? Pt said, he was in the hospital recently because of diarrhea. He said, he is currently not following his daily meals and medication specially his coumadin. He would like to speak with the coumadin clinic

## 2023-04-11 ENCOUNTER — Ambulatory Visit: Payer: Medicare Other | Attending: Internal Medicine

## 2023-04-11 ENCOUNTER — Telehealth: Payer: Self-pay | Admitting: Internal Medicine

## 2023-04-11 DIAGNOSIS — Z79899 Other long term (current) drug therapy: Secondary | ICD-10-CM | POA: Diagnosis not present

## 2023-04-11 DIAGNOSIS — Z7901 Long term (current) use of anticoagulants: Secondary | ICD-10-CM

## 2023-04-11 DIAGNOSIS — I6349 Cerebral infarction due to embolism of other cerebral artery: Secondary | ICD-10-CM

## 2023-04-11 DIAGNOSIS — I4819 Other persistent atrial fibrillation: Secondary | ICD-10-CM

## 2023-04-11 LAB — POCT INR: INR: 4.6 — AB (ref 2.0–3.0)

## 2023-04-11 NOTE — Telephone Encounter (Signed)
Ronald Stade, MD  to Me  Cv Div Ch St Triage     04/11/23  9:42 AM Can decrease lopressor to 25 mg daily while sick    I spoke with patient and gave him message from Dr Eden Emms.  Once diarrhea improves he will try and increase back to 25 BID if BP is OK.  Patient advised to make sure he stays hydrated.

## 2023-04-11 NOTE — Telephone Encounter (Signed)
I spoke with patient and his wife.  He was in the hospital earlier this year for pneumonia and has also been diagnosed with multiple myeloma.  Metoprolol was increased during this hospitalization to 50 mg BID.  BP had been doing fine until he developed diarrhea recently due to cancer medication. Was seen in the ED.  He continues to have off and on diarrhea.  He saw his PCP last week.  Late last week BP would be 120/80 and 117/75 prior to taking metoprolol and then later after taking metoprolol his BP would be 80-84/60.  He contacted PCP on Friday and was instructed to decrease metoprolol to 25 BID.  He made this change but there has been no change in his BP.  I asked patient to contact PCP for recommendations as he was seen in that office last week and they made medication advisement on Friday.   Patient reports he saw Dr Eden Emms in the hospital and would like to change to Dr Eden Emms.  Change was approved in phone note. Will forward to Dr Eden Emms

## 2023-04-11 NOTE — Patient Instructions (Signed)
HOLD TODAY and TOMORROW THEN Continue taking warfarin 1 tablet daily EXCEPT 1/2 Mondays, Wednesdays, and Fridays.  Continue eating broccoli every other day Recheck INR 2 weeks.  Anticoagulation Clinic-[phone].

## 2023-04-11 NOTE — Telephone Encounter (Signed)
Pt c/o medication issue:  1. Name of Medication: metoprolol tartrate (LOPRESSOR) 50 MG tablet   2. How are you currently taking this medication (dosage and times per day)? As prescribed   3. Are you having a reaction (difficulty breathing--STAT)?   4. What is your medication issue? Patient would like a call back to discuss this medication. He states that his BP has been normal or running below normal and would like to talk to someone about possibly lowering the dose. Please advise.

## 2023-04-19 NOTE — Progress Notes (Unsigned)
[facility] OFFICE PROGRESS NOTE  Ronald Has, MD [address] [place] [address]  DIAGNOSIS:  1) Multiple myeloma, presented with plasmacytoma as well as other lytic lesions in the bone diagnosed in March 2024 2) right lower lobe pulmonary nodule suspicious for non-small cell lung cancer  PRIOR THERAPY: SBRT to the right lower lobe pulmonary nodule under the care of Dr. Kathrynn Duran.   CURRENT THERAPY:  1) Systemic chemotherapy with daratumumab subcutaneously weekly in addition to Velcade 1.3 Mg/M2 on days 1, 4, 8, 11 every 3 weeks as well as Revlimid 25 mg p.o. daily for 14 days every 3 weeks and Decadron 20 mg p.o. weekly with chemotherapy.  First cycle expected on 12/05/2022.  Status post 6 cycles. Revlimid Duran been on hold since June/May 2024. Velcade being held or cycle #7 due to PN and diarrhea.  2) Zometa monthly, first dose on 03/13/23  INTERVAL HISTORY: Ronald Duran 82 y.o. male returns to the clinic today for a follow-up visit accompanied by his wife.  The patient is currently being treated for multiple myeloma.  He periodically struggles with thrombocytopenia and treatment sometimes needs to be held due to lab parameters not being met.  His Revlimid Duran been held since late May/early June 2024 due to intolerance.   The patient was last seen by Ronald Duran on 03/27/2023.  That time, the patient was endorsing insomnia and left hip pain for which he is prescribed tramadol for hip pain, but he Duran not been taking it because it makes him tired.  He also had some swelling around his left leg.  He is on Coumadin for cardiac concerns.  He is followed by the Coumadin clinic, he Duran another INR check tomorrow.  Ronald Duran arrange for lower extremity Doppler ultrasound which was performed on 03/28/2023 which showed no evidence of DVT.  In the interval since last being seen, the patient presented to the emergency room on 04/01/2023 with a chief complaint of  diarrhea. He estimates he had loose bowel movements >10x per day at its worst. This starting the day after his 8/23 infusion with velcade. He was admitted until 04/01/2023. Stool studies were unremarkable.  No abnormal CT scan findings.  His potassium was significantly low at 2.5 which was replaced.   His bowels had just started returning to normal.  His last bowel movement was on Saturday 9/14 and was normal.  However the patient's PCP recommended taking Imodium preventatively which she Duran been taking 3-4 times a day which Duran been keeping the diarrhea at bay.  He is unsure if the diarrhea would recur if he stopped taking Imodium.  He overall is feeling well today.  He did lose a lot of weight since last being seen due to the diarrhea.  He is slowly trying to expand his diet from the brat diet.  He is considering starting probiotics.  He Duran been eating yogurt.  He see PCP also recommended taking a calcium supplement 600 mcg/day which we are in agreement with.  For his hypokalemia and atrial fibrillation, the patient is followed by Dr. Eden Duran. Due to the diarrhea and dehydration, the patient was struggling with hypotension and they reached out to his cardiologist who recommended that he resume taking his Lopressor once he is feeling better and blood pressure increases and he hydrates.  He Duran been trying to hydrate.  The patient mentions that he also Duran had peripheral neuropathy for a few weeks.  He is  in his hands and feet which feels like pins-and-needles.  The neuropathy is constant for the most part.  Overall, he is feeling fairly today.  He denies any fever. He reports since the weight loss he Duran more cold intolerance.  Denies any chest pain, shortness of breath, cough, or hemoptysis.  Denies any nausea, vomiting, or constipation.  Denies any headache or visual changes.  Denies any signs and symptoms of infection including sore throat, skin infections, abdominal pain, or dysuria.  He is here today  for evaluation repeat blood work before undergoing day 1 cycle #7   MEDICAL HISTORY: Past Medical History:  Diagnosis Date   Atrial fibrillation (HCC)    CAD (coronary artery disease)    a. s/p MI with LAD stent placement December, 1999 -last stress test November 2008 with no EKG evidence of ischemia  b. cath 07/23/2015 after abnormal ETT and nuc, occluded mid LAD stent with good distal collateral, medical therapy    Cardiomyopathy (HCC)    Ischemic cardiomyopathy with EF 40% by echo 2005   COVID 2021   mild case   Dyslipidemia    Erectile dysfunction    GERD (gastroesophageal reflux disease)    Hiatal hernia    with esophageal strictures and dilatations in the past - Ronald Duran   History of nuclear stress test    Myoview 12/16: EF 47%, anterior, anteroseptal and inferoseptal defect suggesting infarct with peri-infarct ischemia, intermediate risk   HTN (hypertension)    Hx of seasonal allergies    Hyperlipidemia    Myocardial infarction (HCC) 08/01/1998   PAD (peripheral artery disease) (HCC)    Pre-diabetes    Prostate cancer (HCC)    with seed implant therapy following Lupron therapy in 2008 and 2009 - followed by Ronald Duran   Ringworm    Stroke (HCC) 04/20/2017   seen on a MRI   Vitamin D deficiency     ALLERGIES:  Duran No Known Allergies.  MEDICATIONS:  Current Outpatient Medications  Medication Sig Dispense Refill   acyclovir (ZOVIRAX) 200 MG capsule Take 1 capsule (200 mg total) by mouth 2 (two) times daily. 60 capsule 5   atorvastatin (LIPITOR) 40 MG tablet Take 40 mg by mouth at bedtime.     B Complex Vitamins (B COMPLEX 100) tablet Take 1 tablet by mouth daily. Balance     cetirizine (ZYRTEC ALLERGY) 10 MG tablet Take 1 tablet (10 mg total) by mouth daily. 30 tablet 0   Cholecalciferol (VITAMIN D3) 2000 UNITS TABS Take 2,000 Units by mouth daily.     Coenzyme Q10 (CO Q-10) 100 MG CAPS Take 100 mg by mouth daily with supper.     dexamethasone (DECADRON) 4 MG  tablet 5 tab weekly start with the first dose of chemotherapy. (Patient taking differently: Take 20 mg by mouth See admin instructions. Take 5 tablets by mouth weekly start with the first dose of chemotherapy. Patient states he did take today because he thought maybe he would have Chemo today per patient) 40 tablet 3   ketoconazole (NIZORAL) 2 % cream Apply 1 Application topically daily as needed for irritation.     lenalidomide (REVLIMID) 15 MG capsule Take 1 capsule (15 mg total) by mouth daily. Take for 14 days, then none for 7 days. Repeat every 21 days. Start Revlimid on day 1 of chemotherapy Auth #=[id number] date obtained 12/19/2022 Adult Male. (Patient not taking: Reported on 02/03/2023) 14 capsule 0   loperamide (IMODIUM) 2 MG capsule Take 1  capsule (2 mg total) by mouth every 6 (six) hours as needed for diarrhea or loose stools. 30 capsule 0   metoprolol tartrate (LOPRESSOR) 50 MG tablet Take 1 tablet (50 mg total) by mouth 2 (two) times daily. 60 tablet 2   Multiple Vitamins-Minerals (CENTRUM) tablet Take 1 tablet by mouth daily.     nitroGLYCERIN (NITROSTAT) 0.4 MG SL tablet PLACE 1 TABLET UNDER THE TONGUE IF NEEDED EVERY 5 MINUTES FOR CHEST PAIN FOR 3 DOSES IF NO RELIEF AFTER FIRST DOSE CALL 911. (Patient taking differently: Place 0.4 mg under the tongue every 5 (five) minutes as needed for chest pain.) 25 tablet 9   omeprazole (PRILOSEC) 20 MG capsule Take 20 mg by mouth daily before breakfast.     prochlorperazine (COMPAZINE) 10 MG tablet Take 1 tablet (10 mg total) by mouth every 6 (six) hours as needed for nausea or vomiting. 30 tablet 1   traMADol (ULTRAM) 50 MG tablet Take 1 tablet (50 mg total) by mouth every 12 (twelve) hours as needed. (Patient taking differently: Take 50 mg by mouth every 12 (twelve) hours as needed for moderate pain.) 30 tablet 0   triamcinolone ointment (KENALOG) 0.5 % Apply 1 Application topically 2 (two) times daily. (Patient taking differently: Apply 1 Application  topically daily as needed (For left foot rash).) 30 g 0   warfarin (COUMADIN) 5 MG tablet TAKE 1/2 TO 1 TABLET BY MOUTH AS DIRECTED BY COUMADIN CLINIC. (Patient taking differently: Take 2.5-5 mg by mouth daily. 4 days - 1 tablet and 3 days - 1/2 tablet) 30 tablet 1   No current facility-administered medications for this visit.   Facility-Administered Medications Ordered in Other Visits  Medication Dose Route Frequency Provider Last Rate Last Admin   acetaminophen (TYLENOL) tablet 650 mg  650 mg Oral Once Si Gaul, MD       daratumumab-hyaluronidase-fihj [facility] FASPRO) 1800-30000 MG-UT/15ML chemo SQ injection 1,800 mg  1,800 mg Subcutaneous Once Si Gaul, MD       diphenhydrAMINE (BENADRYL) capsule 50 mg  50 mg Oral Once Si Gaul, MD       Zoledronic Acid (ZOMETA) IVPB 4 mg  4 mg Intravenous Once Melecio Cueto L, PA-C        SURGICAL HISTORY:  Past Surgical History:  Procedure Laterality Date   BRONCHIAL BIOPSY  11/15/2022   Procedure: BRONCHIAL BIOPSIES;  Surgeon: [name], DO;  Location: [facility] ENDOSCOPY;  Service: Pulmonary;;   BRONCHIAL BRUSHINGS  11/15/2022   Procedure: BRONCHIAL BRUSHINGS;  Surgeon: [name], DO;  Location: [facility] ENDOSCOPY;  Service: Pulmonary;;   BRONCHIAL NEEDLE ASPIRATION BIOPSY  11/15/2022   Procedure: BRONCHIAL NEEDLE ASPIRATION BIOPSIES;  Surgeon: [name], DO;  Location: [facility] ENDOSCOPY;  Service: Pulmonary;;   CARDIAC CATHETERIZATION N/A 07/23/2015   Procedure: Left Heart Cath and Coronary Angiography;  Surgeon: [name], MD;  Location: [facility] INVASIVE CV LAB;  Service: Cardiovascular;  Laterality: N/A;   CARDIOVERSION N/A 06/03/2022   Procedure: CARDIOVERSION;  Surgeon: [name], MD;  Location: [facility] ENDOSCOPY;  Service: Cardiovascular;  Laterality: N/A;   COLONOSCOPY WITH PROPOFOL N/A 11/24/2015   Procedure: COLONOSCOPY WITH PROPOFOL;  Surgeon: [name], MD;  Location: [facility] ENDOSCOPY;  Service: Endoscopy;   Laterality: N/A;   esophageal strictures with dilatations in the past per Ronald Duran     last '98   FIDUCIAL MARKER PLACEMENT  11/15/2022   Procedure: FIDUCIAL MARKER PLACEMENT;  Surgeon: [name], DO;  Location: [facility]  ENDOSCOPY;  Service: Pulmonary;;   HERNIA REPAIR Left    INTRAMEDULLARY (IM) NAIL INTERTROCHANTERIC Left 10/25/2022   Procedure: LEFT INTRAMEDULLARY (IM) NAIL INTERTROCHANTERIC;  Surgeon: [name], MD;  Location: [facility] OR;  Service: Orthopedics;  Laterality: Left;   IR BONE MARROW BIOPSY & ASPIRATION  11/17/2022   IR BONE TUMOR(S)RF ABLATION  10/11/2022   IR KYPHO THORACIC WITH BONE BIOPSY  10/11/2022   LAD stent placement - Dr. [name]     left inguinal hernia repair - Dr. [name]     prostatic radioactive seed implantation - Dr. [name]     '09    REVIEW OF SYSTEMS:   Constitutional: Positive for weight loss and stable fatigue.  Negative for fever and chills.   HENT: negative for mouth sores, nosebleeds, and trouble swallowing.   Eyes: Negative for eye problems and icterus.  Respiratory: Negative for cough, hemoptysis, shortness of breath and wheezing.   Cardiovascular: Positive for stable mild left ankle swelling.  Negative for chest pain.  Gastrointestinal: Positive for improved diarrhea.  Negative for abdominal pain, constipation, nausea and vomiting.  Genitourinary: Negative for bladder incontinence, difficulty urinating, dysuria, frequency and hematuria.   Musculoskeletal: Positive for occasional chronic back/hip pain at night.  Negative for gait problem, neck pain and neck stiffness.  Skin: Negative for rash and itching.   Neurological: Negative for dizziness, extremity weakness, gait problem, headaches, light-headedness and seizures.  Hematological: Negative for adenopathy. Does not bleed easily. Positive for easy bruising.  Psychiatric/Behavioral: Negative for confusion, depression and sleep disturbance. The patient is not nervous/anxious.      PHYSICAL EXAMINATION:  Blood pressure 99/70, pulse 88, temperature (!) 97.1 F (36.2 C), temperature source Temporal, resp. rate 18, height 6' (1.829 m), weight 177 lb 9 oz (80.5 kg), SpO2 99%.  ECOG PERFORMANCE STATUS: 1-2  Physical Exam  Constitutional: Oriented to person, place, and time and thin appearing male, and in no distress.  HENT:  Head: Normocephalic and atraumatic.  Mouth/Throat: Oropharynx is clear and moist. No oropharyngeal exudate.  Eyes: Conjunctivae are normal. Right eye exhibits no discharge. Left eye exhibits no discharge. No scleral icterus.  Neck: Normal range of motion. Neck supple.  Cardiovascular: Normal rate, regular rhythm, normal heart sounds and intact distal pulses.   Pulmonary/Chest: Effort normal and breath sounds normal. No respiratory distress. No wheezes. No rales.  Abdominal: Soft. Bowel sounds are normal. Exhibits no distension and no mass. There is no tenderness.  Musculoskeletal: Normal range of motion. Stable ankle swelling.  Lymphadenopathy:    No cervical adenopathy.  Neurological: Positive for tremor. Alert and oriented to person, place, and time. Exhibits normal muscle tone. Gait normal. Coordination normal.  Skin: Positive for bruising.  Skin is warm and dry.  Not diaphoretic.  No pallor. Psychiatric: Mood, memory and judgment normal.  Vitals reviewed.  LABORATORY DATA: Lab Results  Component Value Date   WBC 10.1 04/24/2023   HGB 15.1 04/24/2023   HCT 44.5 04/24/2023   MCV 101.1 (H) 04/24/2023   PLT 100 (L) 04/24/2023      Chemistry      Component Value Date/Time   NA 134 (L) 04/24/2023 1454   NA 141 08/25/2022 1440   K 4.7 04/24/2023 1454   CL 103 04/24/2023 1454   CO2 24 04/24/2023 1454   BUN 14 04/24/2023 1454   BUN 14 08/25/2022 1440   CREATININE 0.81 04/24/2023 1454   CREATININE 1.23 (H) 07/21/2015 0917      Component Value Date/Time  CALCIUM 8.7 (L) 04/24/2023 1454   ALKPHOS 73 04/24/2023 1454   AST 27  04/24/2023 1454   ALT 41 04/24/2023 1454   BILITOT 1.6 (H) 04/24/2023 1454       RADIOGRAPHIC STUDIES:  CT ABDOMEN PELVIS W CONTRAST  Result Date: 04/01/2023 CLINICAL DATA:  Acute abdominal pain. Diarrhea. Multiple myeloma and prostate carcinoma. * Tracking Code: BO * EXAM: CT ABDOMEN AND PELVIS WITH CONTRAST TECHNIQUE: Multidetector CT imaging of the abdomen and pelvis was performed using the standard protocol following bolus administration of intravenous contrast. RADIATION DOSE REDUCTION: This exam was performed according to the departmental dose-optimization program which includes automated exposure control, adjustment of the mA and/or kV according to patient size and/or use of iterative reconstruction technique. CONTRAST:  OMNIPAQUE IOHEXOL 300 MG/ML  SOLN COMPARISON:  10/20/2009 FINDINGS: Lower Chest: Small bilateral pleural effusions and mild bibasilar atelectasis. Hepatobiliary: No suspicious hepatic masses identified. Stable tiny left hepatic lobe cyst. Few tiny gallstones are seen, however there is no evidence of cholecystitis or biliary ductal dilatation. Pancreas:  No mass or inflammatory changes. Spleen: Within normal limits in size and appearance. Adrenals/Urinary Tract: No suspicious masses identified. No evidence of ureteral calculi or hydronephrosis. Stomach/Bowel: No evidence of obstruction, inflammatory process or abnormal fluid collections. Vascular/Lymphatic: No pathologically enlarged lymph nodes. No acute vascular findings. Reproductive:  Prostate brachytherapy seeds noted. Other:  None. Musculoskeletal: No suspicious bone lesions identified. Left hip prosthesis and previous lower thoracic vertebroplasty noted. IMPRESSION: No acute findings within abdomen or pelvis. Cholelithiasis. No radiographic evidence of cholecystitis. Small bilateral pleural effusions and mild bibasilar atelectasis. Electronically Signed   By: Danae Orleans M.D.   On: 04/01/2023 11:00   VAS [place] LOWER  EXTREMITY VENOUS (DVT)  Result Date: 03/28/2023  Lower Venous DVT Study Patient Name:  ARIYAN SOTELLO  Date of Exam:   03/28/2023 Medical Rec #: [id number]         Accession #:    [id number] Date of Birth: [birth year]        Patient Gender: M Patient Age:   89 years Exam Location:  [facility] Procedure:      VAS [place] LOWER EXTREMITY VENOUS (DVT) Referring Phys: [facility] MOHAMED --------------------------------------------------------------------------------  Indications: Swelling.  Risk Factors: Cancer. Limitations: Poor ultrasound/tissue interface. Comparison Study: No prior studies. Performing Technologist: Chanda Busing RVT  Examination Guidelines: A complete evaluation includes B-mode imaging, spectral Doppler, color Doppler, and power Doppler as needed of all accessible portions of each vessel. Bilateral testing is considered an integral part of a complete examination. Limited examinations for reoccurring indications may be performed as noted. The reflux portion of the exam is performed with the patient in reverse Trendelenburg.  +-----+---------------+---------+-----------+----------+--------------+ RIGHTCompressibilityPhasicitySpontaneityPropertiesThrombus Aging +-----+---------------+---------+-----------+----------+--------------+ CFV  Full           Yes      Yes                                 +-----+---------------+---------+-----------+----------+--------------+   +---------+---------------+---------+-----------+----------+--------------+ LEFT     CompressibilityPhasicitySpontaneityPropertiesThrombus Aging +---------+---------------+---------+-----------+----------+--------------+ CFV      Full           Yes      Yes                                 +---------+---------------+---------+-----------+----------+--------------+ SFJ      Full                                                         +---------+---------------+---------+-----------+----------+--------------+  FV Prox  Full                                                        +---------+---------------+---------+-----------+----------+--------------+ FV Mid   Full                                                        +---------+---------------+---------+-----------+----------+--------------+ FV DistalFull                                                        +---------+---------------+---------+-----------+----------+--------------+ PFV      Full                                                        +---------+---------------+---------+-----------+----------+--------------+ POP      Full           Yes      Yes                                 +---------+---------------+---------+-----------+----------+--------------+ PTV      Full                                                        +---------+---------------+---------+-----------+----------+--------------+ PERO     Full                                                        +---------+---------------+---------+-----------+----------+--------------+    Summary: RIGHT: - No evidence of common femoral vein obstruction.  LEFT: - There is no evidence of deep vein thrombosis in the lower extremity.  - No cystic structure found in the popliteal fossa.  *See table(s) above for measurements and observations. Electronically signed by Waverly Ferrari MD on 03/28/2023 at 2:38:11 PM.    Final      ASSESSMENT/PLAN:  This is a very pleasant 82 year old Caucasian male with multiple myeloma presented with multiple lytic lesions involving T10, left femoral neck as well as T4, T9 and T11 vertebral bodies.    He also had right lower lobe lung nodule that was suspicious for non-small cell lung cancer.    The biopsy performed by interventional radiology from the T10 lesion was consistent with plasmacytoma which is part of the whole picture of multiple  myeloma.   he biopsy from the left hip was negative for malignancy. His myeloma panel showed no concerning findings except for elevated IgG but the serum light chains were  normal. The patient underwent left hip fixation for impending fracture. He also underwent palliative radiotherapy under the care of Dr. Kathrynn Duran. His recent bone marrow biopsy and aspirate was consistent with plasma cell neoplasm with involvement of plasma cells 25% of the bone marrow.   The patient is currently on treatment with daratumumab, Velcade, Revlimid and dexamethasone started on 12/05/2022 status post 5 cycle.  The patient Duran had treatment held periodically due to thrombocytopenia.  His dose of Revlimid was reduced to 15 milligrams due to thrombocytopenia.  However, his Revlimid was ultimately held starting from May/June 2024.    Labs were reviewed.  Reviewed his peripheral neuropathy and diarrhea with Ronald Duran.  Since the patient Duran completed approximately 6 cycles with Velcade, consider discontinuing Velcade.  The patient is nervous about discontinuing Velcade.  Therefore we will just hold Velcade for this cycle and revisit this at his next appointment.  He will proceed with Darzalex and Zometa today.  I did let him know I agree with his PCP that he should take calcium supplements.  Discussed that the Zometa will drop his calcium.    His potassium is improved today to 4.7.  He is eating 2 bananas per day because he had significant hypokalemia in the hospital.  He will cut back to 1 banana per day.   We will hold off on starting the patient on any gabapentin for his peripheral neuropathy at this time to avoid any dizziness or swelling from gabapentin.  The patient Duran been struggling with hypotension.  He will continue to follow-up with cardiology regarding his rate control A-fib.  He will continue to follow with the Coumadin clinic and Duran an appointment for an INR recheck tomorrow.  I have removed his  appointments for Velcade with this cycle of treatment.  We will see him back on 05/15/2023 for repeat labs before starting his next likely treatment.  The patient was advised to call immediately if he Duran any concerning symptoms in the interval. The patient voices understanding of current disease status and treatment options and is in agreement with the current care plan. All questions were answered. The patient knows to call the clinic with any problems, questions or concerns. We can certainly see the patient much sooner if necessary       Orders Placed This Encounter  Procedures   CBC with Differential (Cancer Center Only)    Standing Status:   Future    Number of Occurrences:   1    Standing Expiration Date:   04/23/2024   CMP (Cancer Center only)    Standing Status:   Future    Number of Occurrences:   1    Standing Expiration Date:   04/23/2024     The total time spent in the appointment was 30-39 minutes  Misha Antonini L Latia Mataya, PA-C 04/24/23

## 2023-04-21 ENCOUNTER — Telehealth: Payer: Self-pay | Admitting: Internal Medicine

## 2023-04-21 ENCOUNTER — Other Ambulatory Visit: Payer: Self-pay | Admitting: Physician Assistant

## 2023-04-21 NOTE — Telephone Encounter (Signed)
Called patient regarding September and October appointments, left a voicemail.

## 2023-04-22 ENCOUNTER — Other Ambulatory Visit: Payer: Self-pay

## 2023-04-24 ENCOUNTER — Inpatient Hospital Stay: Payer: Medicare Other

## 2023-04-24 ENCOUNTER — Inpatient Hospital Stay: Payer: Medicare Other | Attending: Internal Medicine | Admitting: Physician Assistant

## 2023-04-24 ENCOUNTER — Encounter: Payer: Self-pay | Admitting: Internal Medicine

## 2023-04-24 VITALS — BP 97/66 | HR 91 | Resp 16

## 2023-04-24 VITALS — BP 99/70 | HR 88 | Temp 97.1°F | Resp 18 | Ht 72.0 in | Wt 177.6 lb

## 2023-04-24 DIAGNOSIS — Z5112 Encounter for antineoplastic immunotherapy: Secondary | ICD-10-CM | POA: Diagnosis present

## 2023-04-24 DIAGNOSIS — C9000 Multiple myeloma not having achieved remission: Secondary | ICD-10-CM

## 2023-04-24 LAB — CMP (CANCER CENTER ONLY)
ALT: 41 U/L (ref 0–44)
AST: 27 U/L (ref 15–41)
Albumin: 3.8 g/dL (ref 3.5–5.0)
Alkaline Phosphatase: 73 U/L (ref 38–126)
Anion gap: 7 (ref 5–15)
BUN: 14 mg/dL (ref 8–23)
CO2: 24 mmol/L (ref 22–32)
Calcium: 8.7 mg/dL — ABNORMAL LOW (ref 8.9–10.3)
Chloride: 103 mmol/L (ref 98–111)
Creatinine: 0.81 mg/dL (ref 0.61–1.24)
GFR, Estimated: >60 mL/min (ref >60)
Glucose, Bld: 122 mg/dL — ABNORMAL HIGH (ref 70–99)
Potassium: 4.7 mmol/L (ref 3.5–5.1)
Sodium: 134 mmol/L — ABNORMAL LOW (ref 135–145)
Total Bilirubin: 1.6 mg/dL — ABNORMAL HIGH (ref 0.3–1.2)
Total Protein: 5.7 g/dL — ABNORMAL LOW (ref 6.5–8.1)

## 2023-04-24 LAB — CBC WITH DIFFERENTIAL (CANCER CENTER ONLY)
Abs Immature Granulocytes: 0.04 10*3/uL (ref 0.00–0.07)
Basophils Absolute: 0 10*3/uL (ref 0.0–0.1)
Basophils Relative: 0 %
Eosinophils Absolute: 0 10*3/uL (ref 0.0–0.5)
Eosinophils Relative: 0 %
HCT: 44.5 % (ref 39.0–52.0)
Hemoglobin: 15.1 g/dL (ref 13.0–17.0)
Immature Granulocytes: 0 %
Lymphocytes Relative: 2 %
Lymphs Abs: 0.2 10*3/uL — ABNORMAL LOW (ref 0.7–4.0)
MCH: 34.3 pg — ABNORMAL HIGH (ref 26.0–34.0)
MCHC: 33.9 g/dL (ref 30.0–36.0)
MCV: 101.1 fL — ABNORMAL HIGH (ref 80.0–100.0)
Monocytes Absolute: 0.2 10*3/uL (ref 0.1–1.0)
Monocytes Relative: 2 %
Neutro Abs: 9.7 10*3/uL — ABNORMAL HIGH (ref 1.7–7.7)
Neutrophils Relative %: 96 %
Platelet Count: 100 10*3/uL — ABNORMAL LOW (ref 150–400)
RBC: 4.4 MIL/uL (ref 4.22–5.81)
RDW: 15.1 % (ref 11.5–15.5)
WBC Count: 10.1 10*3/uL (ref 4.0–10.5)
nRBC: 0 % (ref 0.0–0.2)

## 2023-04-24 MED ORDER — SODIUM CHLORIDE 0.9 % IV SOLN: Freq: Once | INTRAVENOUS | Status: AC

## 2023-04-24 MED ORDER — ACETAMINOPHEN 325 MG PO TABS
650.0000 mg | ORAL_TABLET | Freq: Once | ORAL | Status: AC
  Administered 2023-04-24: 650 mg via ORAL
  Filled 2023-04-24: qty 2

## 2023-04-24 MED ORDER — DARATUMUMAB-HYALURONIDASE-FIHJ 1800-30000 MG-UT/15ML ~~LOC~~ SOLN
1800.0000 mg | Freq: Once | SUBCUTANEOUS | Status: AC
  Administered 2023-04-24: 1800 mg via SUBCUTANEOUS
  Filled 2023-04-24: qty 15

## 2023-04-24 MED ORDER — DIPHENHYDRAMINE HCL 25 MG PO CAPS
50.0000 mg | ORAL_CAPSULE | Freq: Once | ORAL | Status: AC
  Administered 2023-04-24: 50 mg via ORAL
  Filled 2023-04-24: qty 2

## 2023-04-24 MED ORDER — ZOLEDRONIC ACID 4 MG/100ML IV SOLN
4.0000 mg | Freq: Once | INTRAVENOUS | Status: AC
  Administered 2023-04-24: 4 mg via INTRAVENOUS
  Filled 2023-04-24: qty 100

## 2023-04-24 NOTE — Progress Notes (Signed)
Ok to treat today with Tbili. of 1.6. Hold the velcade today. Please give zometa with corrected calcium of 8.86- per Cassie Heilingoetter PA- C and Dr. Arbutus Ped.  Patient took his steroid at home around noon today.

## 2023-04-24 NOTE — Patient Instructions (Signed)
[facility]  Discharge Instructions: Thank you for choosing [facility] to provide your oncology and hematology care.   If you have a lab appointment with the Cancer Center, please go directly to the Cancer Center and check in at the registration area.   Wear comfortable clothing and clothing appropriate for easy access to any Portacath or PICC line.   We strive to give you quality time with your provider. You may need to reschedule your appointment if you arrive late (15 or more minutes).  Arriving late affects you and other patients whose appointments are after yours.  Also, if you miss three or more appointments without notifying the office, you may be dismissed from the clinic at the provider's discretion.      For prescription refill requests, have your pharmacy contact our office and allow 72 hours for refills to be completed.    Today you received the following chemotherapy and/or immunotherapy agents: Darzalex Faspro, Zometa     To help prevent nausea and vomiting after your treatment, we encourage you to take your nausea medication as directed.  BELOW ARE SYMPTOMS THAT SHOULD BE REPORTED IMMEDIATELY: *FEVER GREATER THAN 100.4 F (38 C) OR HIGHER *CHILLS OR SWEATING *NAUSEA AND VOMITING THAT IS NOT CONTROLLED WITH YOUR NAUSEA MEDICATION *UNUSUAL SHORTNESS OF BREATH *UNUSUAL BRUISING OR BLEEDING *URINARY PROBLEMS (pain or burning when urinating, or frequent urination) *BOWEL PROBLEMS (unusual diarrhea, constipation, pain near the anus) TENDERNESS IN MOUTH AND THROAT WITH OR WITHOUT PRESENCE OF ULCERS (sore throat, sores in mouth, or a toothache) UNUSUAL RASH, SWELLING OR PAIN  UNUSUAL VAGINAL DISCHARGE OR ITCHING   Items with * indicate a potential emergency and should be followed up as soon as possible or go to the Emergency Department if any problems should occur.  Please show the CHEMOTHERAPY ALERT CARD or IMMUNOTHERAPY ALERT  CARD at check-in to the Emergency Department and triage nurse.  Should you have questions after your visit or need to cancel or reschedule your appointment, please contact [facility]  Dept: [phone]  and follow the prompts.  Office hours are 8:00 a.m. to 4:30 p.m. Monday - Friday. Please note that voicemails left after 4:00 p.m. may not be returned until the following business day.  We are closed weekends and major holidays. You have access to a nurse at all times for urgent questions. Please call the main number to the clinic Dept: [phone] and follow the prompts.   For any non-urgent questions, you may also contact your provider using MyChart. We now offer e-Visits for anyone 44 and older to request care online for non-urgent symptoms. For details visit [email or web address].   Also download the MyChart app! Go to the app store, search "MyChart", open the app, select [facility], and log in with your MyChart username and password.

## 2023-04-25 ENCOUNTER — Ambulatory Visit: Payer: Medicare Other | Attending: Cardiology | Admitting: *Deleted

## 2023-04-25 ENCOUNTER — Other Ambulatory Visit: Payer: Self-pay | Admitting: Cardiovascular Disease

## 2023-04-25 DIAGNOSIS — I6349 Cerebral infarction due to embolism of other cerebral artery: Secondary | ICD-10-CM | POA: Diagnosis not present

## 2023-04-25 DIAGNOSIS — I4819 Other persistent atrial fibrillation: Secondary | ICD-10-CM | POA: Diagnosis not present

## 2023-04-25 DIAGNOSIS — Z79899 Other long term (current) drug therapy: Secondary | ICD-10-CM | POA: Diagnosis not present

## 2023-04-25 LAB — POCT INR: POC INR: 5

## 2023-04-25 NOTE — Patient Instructions (Signed)
Description   HOLD TODAY and TOMORROW THEN START taking warfarin 1/2 a tablet daily EXCEPT 1 tablet on Sundays, Tuesdays and Thursdays. Continue eating broccoli every other day Recheck INR 1 1/2 weeks.  Anticoagulation Clinic-[phone].

## 2023-04-27 ENCOUNTER — Ambulatory Visit: Payer: Medicare Other

## 2023-04-29 ENCOUNTER — Other Ambulatory Visit: Payer: Self-pay

## 2023-05-01 ENCOUNTER — Other Ambulatory Visit: Payer: Medicare Other

## 2023-05-01 ENCOUNTER — Ambulatory Visit: Payer: Medicare Other

## 2023-05-04 ENCOUNTER — Ambulatory Visit: Payer: Medicare Other

## 2023-05-04 ENCOUNTER — Ambulatory Visit: Payer: Medicare Other | Attending: Cardiology

## 2023-05-04 DIAGNOSIS — I4819 Other persistent atrial fibrillation: Secondary | ICD-10-CM

## 2023-05-04 DIAGNOSIS — Z79899 Other long term (current) drug therapy: Secondary | ICD-10-CM

## 2023-05-04 DIAGNOSIS — I6349 Cerebral infarction due to embolism of other cerebral artery: Secondary | ICD-10-CM

## 2023-05-04 LAB — POCT INR: INR: 2.7 (ref 2.0–3.0)

## 2023-05-04 NOTE — Patient Instructions (Signed)
Description   Continue taking warfarin 1/2 a tablet daily EXCEPT 1 tablet on Sundays, Tuesdays and Thursdays. Continue eating broccoli every other day Recheck INR 2 weeks.  Anticoagulation Clinic-[phone].

## 2023-05-06 ENCOUNTER — Other Ambulatory Visit: Payer: Self-pay

## 2023-05-10 NOTE — Progress Notes (Signed)
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 Kathrynn Running.   CURRENT THERAPY: 1) Systemic chemotherapy with daratumumab subcutaneously weekly in addition to Velcade 1.3 Mg/M2 on days 1, 4, 8, 11 every 3 weeks as well as Revlimid 25 mg p.o. daily for 14 days every 3 weeks and Decadron 20 mg p.o. weekly with chemotherapy.  First cycle expected on 12/05/2022.  Status post 7 cycles. Revlimid has been on hold since June/May 2024. Velcade being held or cycle #7 due to PN and diarrhea.  2) Zometa monthly, first dose on 03/13/23  INTERVAL HISTORY: Ronald Duran 82 y.o. male returns to the clinic today for a follow-up visit accompanied by his wife.  The patient was last seen by myself and Dr. Arbutus Ped on 04/24/2023.  His last appointment, he was recovering from hypotension, dehydration, and diarrhea.  He also started developing peripheral neuropathy.  The patient declined starting gabapentin for neuropathy to avoid any possible medication side effects such as dizziness or swelling.  Velcade was held and we consider discontinuing Velcade although the patient was wondering about permanently discontinuing Velcade and the effect on the effectiveness of his treatment. Additionally his Revlimid has been on hold since May/June 2024 due to cytopenias.  With regard to the diarrhea and neuropathy, his diarrhea has improved and he only needed 1 dose of Imodium since last being seen.  He also briefly had some mild constipation over the weekend which has resolved.  The neuropathy is about stable.   Despite attempts to gain weight, the patient has lost approximately  eight pounds. He reports adequate food intake, including nutritional supplements such as Boost and Ensure, and has expressed interest in further nutritional counseling. He has also started taking calcium supplements.  The patient has not reported any recent fevers, chills, breathing changes, nausea, vomiting, headaches, vision changes, or signs of infection. He has been urinating regularly and reports no issues in this regard.  The patient's foot swelling has decreased, with only moderate swelling remaining in the left foot. He wears compression stockings daily, which seem to help manage the swelling. The patient denies any pain in the calf, apart from discomfort associated with exercise.  His fatigue is stable to improved.  Of course, he sometimes has good and bad days with energy.  He is still doing physical therapy once a week.  They are helping with balance and leg strengthening.  He still sleeping in a recliner due to back pain with trying to get up from a lying position from bed.  Have considered adjustable beds but he is okay with sleeping in a recliner.  Are wondering if he can have his routine vaccinations.  He is also scheduled to see a specialist due to a "wrinkle" on the macula.  He is here today for evaluation repeat blood work before undergoing day 1 cycle #8   MEDICAL HISTORY: Past Medical History:  Diagnosis Date   Atrial fibrillation (HCC)    CAD (coronary artery disease)    a. s/p MI with LAD stent placement December, 1999 -last stress test November 2008 with no EKG evidence of ischemia  b. cath 07/23/2015 after abnormal ETT  and nuc, occluded mid LAD stent with good distal collateral, medical therapy    Cardiomyopathy (HCC)    Ischemic cardiomyopathy with EF 40% by echo 2005   COVID 2021   mild case   Dyslipidemia    Erectile dysfunction    GERD (gastroesophageal reflux disease)    Hiatal hernia    with esophageal strictures and dilatations in the past - Dr. [name]    History of nuclear stress test    Myoview 12/16: EF 47%, anterior, anteroseptal and inferoseptal defect suggesting infarct with peri-infarct ischemia, intermediate risk   HTN (hypertension)    Hx of seasonal allergies    Hyperlipidemia    Myocardial infarction (HCC) 08/01/1998   PAD (peripheral artery disease) (HCC)    Pre-diabetes    Prostate cancer (HCC)    with seed implant therapy following Lupron therapy in 2008 and 2009 - followed by Dr. Isabel Caprice   Ringworm    Stroke (HCC) 04/20/2017   seen on a MRI   Vitamin D deficiency     ALLERGIES:  has No Known Allergies.  MEDICATIONS:  Current Outpatient Medications  Medication Sig Dispense Refill   acyclovir (ZOVIRAX) 200 MG capsule Take 1 capsule (200 mg total) by mouth 2 (two) times daily. 60 capsule 5   atorvastatin (LIPITOR) 40 MG tablet Take 40 mg by mouth at bedtime.     B Complex Vitamins (B COMPLEX 100) tablet Take 1 tablet by mouth daily. Balance     cetirizine (ZYRTEC ALLERGY) 10 MG tablet Take 1 tablet (10 mg total) by mouth daily. 30 tablet 0   Cholecalciferol (VITAMIN D3) 2000 UNITS TABS Take 2,000 Units by mouth daily.     Coenzyme Q10 (CO Q-10) 100 MG CAPS Take 100 mg by mouth daily with supper.     dexamethasone (DECADRON) 4 MG tablet 5 tab weekly start with the first dose of chemotherapy. (Patient taking differently: Take 20 mg by mouth See admin instructions. Take 5 tablets by mouth weekly start with the first dose of chemotherapy. Patient states he did take today because he thought maybe he would have Chemo today per patient) 40 tablet 3   ketoconazole (NIZORAL) 2 % cream Apply 1 Application topically daily as needed for irritation.     lenalidomide (REVLIMID) 15 MG capsule Take 1 capsule (15 mg total) by mouth daily. Take for 14 days, then none for 7 days. Repeat every 21 days. Start Revlimid on day 1 of chemotherapy Auth #=[id number] date obtained 12/19/2022 Adult Male. (Patient not taking: Reported on 02/03/2023) 14 capsule  0   loperamide (IMODIUM) 2 MG capsule Take 1 capsule (2 mg total) by mouth every 6 (six) hours as needed for diarrhea or loose stools. 30 capsule 0   metoprolol tartrate (LOPRESSOR) 25 MG tablet Take 0.5 tablets (12.5 mg total) by mouth 2 (two) times daily. 90 tablet 3   Multiple Vitamins-Minerals (CENTRUM) tablet Take 1 tablet by mouth daily.     nitroGLYCERIN (NITROSTAT) 0.4 MG SL tablet PLACE 1 TABLET UNDER THE TONGUE IF NEEDED EVERY 5 MINUTES FOR CHEST PAIN FOR 3 DOSES IF NO RELIEF AFTER FIRST DOSE CALL 911. (Patient taking differently: Place 0.4 mg under the tongue every 5 (five) minutes as needed for chest pain.) 25 tablet 9   omeprazole (PRILOSEC) 20 MG capsule Take 20 mg by mouth daily before breakfast.     prochlorperazine (COMPAZINE) 10 MG tablet Take 1 tablet (10 mg total) by mouth every 6 (six) hours as needed for nausea  or vomiting. 30 tablet 1   traMADol (ULTRAM) 50 MG tablet Take 1 tablet (50 mg total) by mouth every 12 (twelve) hours as needed. (Patient taking differently: Take 50 mg by mouth every 12 (twelve) hours as needed for moderate pain.) 30 tablet 0   triamcinolone ointment (KENALOG) 0.5 % Apply 1 Application topically 2 (two) times daily. (Patient taking differently: Apply 1 Application topically daily as needed (For left foot rash).) 30 g 0   warfarin (COUMADIN) 5 MG tablet TAKE 1/2 TO 1 TABLET BY MOUTH AS DIRECTED by coumadin clinic 30 tablet 1   No current facility-administered medications for this visit.   Facility-Administered Medications Ordered in Other Visits  Medication Dose Route Frequency Provider Last Rate Last Admin   acetaminophen (TYLENOL) tablet 650 mg  650 mg Oral Once Si Gaul, MD       daratumumab-hyaluronidase-fihj [facility] FASPRO) 1800-30000 MG-UT/15ML chemo SQ injection 1,800 mg  1,800 mg Subcutaneous Once Si Gaul, MD       diphenhydrAMINE (BENADRYL) capsule 50 mg  50 mg Oral Once Si Gaul, MD        SURGICAL HISTORY:  Past  Surgical History:  Procedure Laterality Date   BRONCHIAL BIOPSY  11/15/2022   Procedure: BRONCHIAL BIOPSIES;  Surgeon: [name], DO;  Location: [facility] END[facility];  Service: Pulmonary;;   BRONCHIAL BRUSHINGS  11/15/2022   Procedure: BRONCHIAL BRUSHINGS;  Surgeon: [name], DO;  Location: [facility];  Service: Pulmonary;;   BRONCHIAL NEEDLE ASPIRATION BIOPSY  11/15/2022   Procedure: BRONCHIAL NEEDLE ASPIRATION BIOPSIES;  Surgeon: [name], DO;  Location: [facility];  Service: Pulmonary;;   CARDIAC CATHETERIZATION N/A 07/23/2015   Procedure: Left Heart Cath and Coronary Angiography;  Surgeon: [name], MD;  Location: [facility] INVASIVE CV LAB;  Service: Cardiovascular;  Laterality: N/A;   CARDIOVERSION N/A 06/03/2022   Procedure: CARDIOVERSION;  Surgeon: [name], MD;  Location: [facility] ENDOSCOPY;  Service: Cardiovascular;  Laterality: N/A;   COLONOSCOPY WITH PROPOFOL N/A 11/24/2015   Procedure: COLONOSCOPY WITH PROPOFOL;  Surgeon: [name], MD;  Location: [facility] ENDOSCOPY;  Service: Endoscopy;  Laterality: N/A;   esophageal strictures with dilatations in the past per Dr. [name]     last '98   FIDUCIAL MARKER PLACEMENT  11/15/2022   Procedure: FIDUCIAL MARKER PLACEMENT;  Surgeon: [name], DO;  Location: [facility]OS[facility];  Service: Pulmonary;;   HERNIA REPAIR Left    INTRAMEDULLARY (IM) NAIL INTERTROCHANTERIC Left 10/25/2022   Procedure: LEFT INTRAMEDULLARY (IM) NAIL INTERTROCHANTERIC;  Surgeon: [name], MD;  Location: [facility] OR;  Service: Orthopedics;  Laterality: Left;   IR BONE MARROW BIOPSY & ASPIRATION  11/17/2022   IR BONE TUMOR(S)RF ABLATION  10/11/2022   IR KYPHO THORACIC WITH BONE BIOPSY  10/11/2022   LAD stent placement - Dr. [name]     left inguinal hernia repair - Dr. [name]     prostatic radioactive seed implantation - Dr. [name]     '09    REVIEW OF SYSTEMS:   Review of Systems  Constitutional: Positive for weight loss.  Negative for  appetite change, chills, fatigue, and fever.  HENT: Negative for mouth sores, nosebleeds, sore throat and trouble swallowing.   Eyes: Negative for eye problems and icterus.  Respiratory: Negative for cough, hemoptysis, shortness of breath and wheezing.   Cardiovascular: Positive for stable mild left ankle swelling. Negative for chest pain.  Gastrointestinal: Negative for abdominal pain, constipation, diarrhea, nausea and vomiting.  Genitourinary:  Negative for bladder incontinence, difficulty urinating, dysuria, frequency and hematuria.   Musculoskeletal: Positive for occasional chronic back/hip pain at night. Negative for gait problem, neck pain and neck stiffness.  Skin: Negative for itching and rash.  Neurological: Negative for dizziness, extremity weakness, gait problem, headaches, light-headedness and seizures.  Hematological: Negative for adenopathy. Does not bruise/bleed easily.  Psychiatric/Behavioral: Negative for confusion, depression and sleep disturbance. The patient is not nervous/anxious.     PHYSICAL EXAMINATION:  Blood pressure 98/63, pulse (!) 102, temperature 97.6 F (36.4 C), temperature source Oral, resp. rate 16, height 6' (1.829 m), weight 169 lb 12.8 oz (77 kg), SpO2 99%.  ECOG PERFORMANCE STATUS: 1-2  Physical Exam  Constitutional: Oriented to person, place, and time and thin appearing male, and in no distress.  HENT:  Head: Normocephalic and atraumatic.  Mouth/Throat: Oropharynx is clear and moist. No oropharyngeal exudate.  Eyes: Conjunctivae are normal. Right eye exhibits no discharge. Left eye exhibits no discharge. No scleral icterus.  Neck: Normal range of motion. Neck supple.  Cardiovascular: Normal rate, regular rhythm, normal heart sounds and intact distal pulses.   Pulmonary/Chest: Effort normal and breath sounds normal. No respiratory distress. No wheezes. No rales.  Abdominal: Soft. Bowel sounds are normal. Exhibits no distension and no mass. There is  no tenderness.  Musculoskeletal: Normal range of motion. Stable ankle swelling (L>R).  Lymphadenopathy:    No cervical adenopathy.  Neurological: Positive for tremor. Alert and oriented to person, place, and time. Exhibits normal muscle tone. Gait normal. Coordination normal.  Skin: Positive for bruising.  Skin is warm and dry.  Not diaphoretic.  No pallor. Psychiatric: Mood, memory and judgment normal.  Vitals reviewed.  LABORATORY DATA: Lab Results  Component Value Date   WBC 10.1 05/15/2023   HGB 15.1 05/15/2023   HCT 43.1 05/15/2023   MCV 101.4 (H) 05/15/2023   PLT 134 (L) 05/15/2023      Chemistry      Component Value Date/Time   NA 134 (L) 04/24/2023 1454   NA 141 08/25/2022 1440   K 4.7 04/24/2023 1454   CL 103 04/24/2023 1454   CO2 24 04/24/2023 1454   BUN 14 04/24/2023 1454   BUN 14 08/25/2022 1440   CREATININE 0.81 04/24/2023 1454   CREATININE 1.23 (H) 07/21/2015 0917      Component Value Date/Time   CALCIUM 8.7 (L) 04/24/2023 1454   ALKPHOS 73 04/24/2023 1454   AST 27 04/24/2023 1454   ALT 41 04/24/2023 1454   BILITOT 1.6 (H) 04/24/2023 1454       RADIOGRAPHIC STUDIES:  No results found.   ASSESSMENT/PLAN:  This is a very pleasant 82 year old Caucasian male with multiple myeloma presented with multiple lytic lesions involving T10, left femoral neck as well as T4, T9 and T11 vertebral bodies.    He also had right lower lobe lung nodule that was suspicious for non-small cell lung cancer.    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    he biopsy from the left hip was negative for malignancy. His myeloma panel showed no concerning findings except for elevated IgG but the serum light chains were normal. The patient underwent left hip fixation for impending fracture. He also underwent palliative radiotherapy under the care of Dr.  Kathrynn Running. His recent bone marrow biopsy and aspirate was consistent with plasma cell neoplasm with involvement of plasma cells 25%  of the bone marrow.   The patient is currently on treatment with daratumumab, Velcade, Revlimid and dexamethasone started on 12/05/2022 status post 5 cycle.  The patient has had treatment held periodically due to thrombocytopenia.  His dose of Revlimid was reduced to 15 milligrams due to thrombocytopenia.  However, his Revlimid was ultimately held starting from May/June 2024.   At his last appointment, Velcade was discontinued as the patient was recovering from diarrhea, hypotension, and dizziness.  He also was developing peripheral neuropathy.  His CBC is notable for treatment today.  His CMP is pending.  As long as his CMP is within parameters, he will proceed with treatment today scheduled.  We will continue to hold his Velcade due to his peripheral neuropathy and diarrhea.  I will refer him to a member the nutritionist team due to his weight loss.  He will continue with boost and Ensure.  I have removed the appointments with Velcade from the cycle of treatment.  We will revisit this at his next appointment.  I will arrange for a repeat myeloma panel week before his next appointment on 10/29.  His Zometa is given monthly.  Therefore we will give this at his next appointment on 10/29.  Will follow-up with his PCP regarding his questions about his routine vaccinations.  He will continue using compression stockings.  He will continue physical therapy.  Will continue to follow with his cardiologist regarding his metoprolol.   Will hold off on starting any medication for full neuropathy to avoid additional side effects of dizziness and leg swelling.  The patient was advised to call immediately if he has any concerning symptoms in the interval. The patient voices understanding of current disease status and treatment options and is in agreement with the current care  plan. All questions were answered. The patient knows to call the clinic with any problems, questions or concerns. We can certainly see the patient much sooner if necessary   Orders Placed This Encounter  Procedures   Kappa/lambda light chains    Standing Status:   Future    Standing Expiration Date:   05/14/2024   Lactate dehydrogenase (LDH)    Standing Status:   Future    Standing Expiration Date:   05/14/2024   Beta 2 microglobulin    Standing Status:   Future    Standing Expiration Date:   05/14/2024   QIG  (Quant. immunoglobulins  - IgG, IgA, IgM)    Standing Status:   Future    Standing Expiration Date:   05/14/2024   CBC with Differential (Cancer Center Only)    Standing Status:   Future    Standing Expiration Date:   05/14/2024   CMP (Cancer Center only)    Standing Status:   Future    Standing Expiration Date:   05/14/2024     The total time spent in the appointment was 30-39 minutes  Telma Pyeatt L Aysa Larivee, PA-C 05/15/23

## 2023-05-11 ENCOUNTER — Other Ambulatory Visit: Payer: Self-pay | Admitting: Medical Oncology

## 2023-05-11 MED ORDER — METOPROLOL TARTRATE 25 MG PO TABS: 12.5000 mg | ORAL_TABLET | Freq: Two times a day (BID) | ORAL | 3 refills | Status: DC

## 2023-05-11 NOTE — Telephone Encounter (Signed)
Revlimid on hold -message given to [facility] .

## 2023-05-15 ENCOUNTER — Inpatient Hospital Stay: Payer: Medicare Other | Attending: Internal Medicine

## 2023-05-15 ENCOUNTER — Inpatient Hospital Stay: Payer: Medicare Other

## 2023-05-15 ENCOUNTER — Inpatient Hospital Stay (HOSPITAL_BASED_OUTPATIENT_CLINIC_OR_DEPARTMENT_OTHER): Payer: Medicare Other | Admitting: Physician Assistant

## 2023-05-15 VITALS — BP 98/63 | HR 102 | Temp 97.6°F | Resp 16 | Ht 72.0 in | Wt 169.8 lb

## 2023-05-15 VITALS — HR 94

## 2023-05-15 DIAGNOSIS — Z79620 Long term (current) use of immunosuppressive biologic: Secondary | ICD-10-CM | POA: Insufficient documentation

## 2023-05-15 DIAGNOSIS — C9000 Multiple myeloma not having achieved remission: Secondary | ICD-10-CM | POA: Insufficient documentation

## 2023-05-15 DIAGNOSIS — Z5112 Encounter for antineoplastic immunotherapy: Secondary | ICD-10-CM | POA: Insufficient documentation

## 2023-05-15 DIAGNOSIS — R634 Abnormal weight loss: Secondary | ICD-10-CM | POA: Diagnosis not present

## 2023-05-15 LAB — CMP (CANCER CENTER ONLY)
ALT: 41 U/L (ref 0–44)
AST: 24 U/L (ref 15–41)
Albumin: 3.8 g/dL (ref 3.5–5.0)
Alkaline Phosphatase: 70 U/L (ref 38–126)
Anion gap: 7 (ref 5–15)
BUN: 22 mg/dL (ref 8–23)
CO2: 25 mmol/L (ref 22–32)
Calcium: 8.8 mg/dL — ABNORMAL LOW (ref 8.9–10.3)
Chloride: 102 mmol/L (ref 98–111)
Creatinine: 0.82 mg/dL (ref 0.61–1.24)
GFR, Estimated: >60 mL/min (ref >60)
Glucose, Bld: 109 mg/dL — ABNORMAL HIGH (ref 70–99)
Potassium: 4 mmol/L (ref 3.5–5.1)
Sodium: 134 mmol/L — ABNORMAL LOW (ref 135–145)
Total Bilirubin: 1.4 mg/dL — ABNORMAL HIGH (ref 0.3–1.2)
Total Protein: 5.5 g/dL — ABNORMAL LOW (ref 6.5–8.1)

## 2023-05-15 LAB — CBC WITH DIFFERENTIAL (CANCER CENTER ONLY)
Abs Immature Granulocytes: 0.04 10*3/uL (ref 0.00–0.07)
Basophils Absolute: 0.1 10*3/uL (ref 0.0–0.1)
Basophils Relative: 1 %
Eosinophils Absolute: 0 10*3/uL (ref 0.0–0.5)
Eosinophils Relative: 0 %
HCT: 43.1 % (ref 39.0–52.0)
Hemoglobin: 15.1 g/dL (ref 13.0–17.0)
Immature Granulocytes: 0 %
Lymphocytes Relative: 6 %
Lymphs Abs: 0.6 10*3/uL — ABNORMAL LOW (ref 0.7–4.0)
MCH: 35.5 pg — ABNORMAL HIGH (ref 26.0–34.0)
MCHC: 35 g/dL (ref 30.0–36.0)
MCV: 101.4 fL — ABNORMAL HIGH (ref 80.0–100.0)
Monocytes Absolute: 0.7 10*3/uL (ref 0.1–1.0)
Monocytes Relative: 6 %
Neutro Abs: 8.8 10*3/uL — ABNORMAL HIGH (ref 1.7–7.7)
Neutrophils Relative %: 87 %
Platelet Count: 134 10*3/uL — ABNORMAL LOW (ref 150–400)
RBC: 4.25 MIL/uL (ref 4.22–5.81)
RDW: 14.3 % (ref 11.5–15.5)
WBC Count: 10.1 10*3/uL (ref 4.0–10.5)
nRBC: 0 % (ref 0.0–0.2)

## 2023-05-15 MED ORDER — ACETAMINOPHEN 325 MG PO TABS
650.0000 mg | ORAL_TABLET | Freq: Once | ORAL | Status: AC
  Administered 2023-05-15: 650 mg via ORAL
  Filled 2023-05-15: qty 2

## 2023-05-15 MED ORDER — DARATUMUMAB-HYALURONIDASE-FIHJ 1800-30000 MG-UT/15ML ~~LOC~~ SOLN
1800.0000 mg | Freq: Once | SUBCUTANEOUS | Status: AC
  Administered 2023-05-15: 1800 mg via SUBCUTANEOUS
  Filled 2023-05-15: qty 15

## 2023-05-15 MED ORDER — DIPHENHYDRAMINE HCL 25 MG PO CAPS
50.0000 mg | ORAL_CAPSULE | Freq: Once | ORAL | Status: AC
  Administered 2023-05-15: 50 mg via ORAL
  Filled 2023-05-15: qty 2

## 2023-05-15 NOTE — Patient Instructions (Signed)
[facility]  Discharge Instructions: Thank you for choosing [facility] to provide your oncology and hematology care.   If you have a lab appointment with the Cancer Center, please go directly to the Cancer Center and check in at the registration area.   Wear comfortable clothing and clothing appropriate for easy access to any Portacath or PICC line.   We strive to give you quality time with your provider. You may need to reschedule your appointment if you arrive late (15 or more minutes).  Arriving late affects you and other patients whose appointments are after yours.  Also, if you miss three or more appointments without notifying the office, you may be dismissed from the clinic at the provider's discretion.      For prescription refill requests, have your pharmacy contact our office and allow 72 hours for refills to be completed.    Today you received the following chemotherapy and/or immunotherapy agents: Darzalex Faspro, Zometa     To help prevent nausea and vomiting after your treatment, we encourage you to take your nausea medication as directed.  BELOW ARE SYMPTOMS THAT SHOULD BE REPORTED IMMEDIATELY: *FEVER GREATER THAN 100.4 F (38 C) OR HIGHER *CHILLS OR SWEATING *NAUSEA AND VOMITING THAT IS NOT CONTROLLED WITH YOUR NAUSEA MEDICATION *UNUSUAL SHORTNESS OF BREATH *UNUSUAL BRUISING OR BLEEDING *URINARY PROBLEMS (pain or burning when urinating, or frequent urination) *BOWEL PROBLEMS (unusual diarrhea, constipation, pain near the anus) TENDERNESS IN MOUTH AND THROAT WITH OR WITHOUT PRESENCE OF ULCERS (sore throat, sores in mouth, or a toothache) UNUSUAL RASH, SWELLING OR PAIN  UNUSUAL VAGINAL DISCHARGE OR ITCHING   Items with * indicate a potential emergency and should be followed up as soon as possible or go to the Emergency Department if any problems should occur.  Please show the CHEMOTHERAPY ALERT CARD or IMMUNOTHERAPY ALERT  CARD at check-in to the Emergency Department and triage nurse.  Should you have questions after your visit or need to cancel or reschedule your appointment, please contact [facility]  Dept: [phone]  and follow the prompts.  Office hours are 8:00 a.m. to 4:30 p.m. Monday - Friday. Please note that voicemails left after 4:00 p.m. may not be returned until the following business day.  We are closed weekends and major holidays. You have access to a nurse at all times for urgent questions. Please call the main number to the clinic Dept: [phone] and follow the prompts.   For any non-urgent questions, you may also contact your provider using MyChart. We now offer e-Visits for anyone 44 and older to request care online for non-urgent symptoms. For details visit [email or web address].   Also download the MyChart app! Go to the app store, search "MyChart", open the app, select [facility], and log in with your MyChart username and password.

## 2023-05-15 NOTE — Progress Notes (Signed)
Patient took steroids this morning at 7am. He would like to defer Zometa to next visit. Pharmacy notified to change dates.

## 2023-05-17 ENCOUNTER — Ambulatory Visit: Payer: Medicare Other | Attending: Cardiovascular Disease | Admitting: *Deleted

## 2023-05-17 DIAGNOSIS — I6349 Cerebral infarction due to embolism of other cerebral artery: Secondary | ICD-10-CM | POA: Diagnosis not present

## 2023-05-17 DIAGNOSIS — Z7901 Long term (current) use of anticoagulants: Secondary | ICD-10-CM | POA: Diagnosis not present

## 2023-05-17 DIAGNOSIS — I4819 Other persistent atrial fibrillation: Secondary | ICD-10-CM

## 2023-05-17 DIAGNOSIS — Z79899 Other long term (current) drug therapy: Secondary | ICD-10-CM | POA: Diagnosis not present

## 2023-05-17 LAB — POCT INR: INR: 3 (ref 2.0–3.0)

## 2023-05-17 NOTE — Patient Instructions (Signed)
Description   Continue taking warfarin 1/2 tablet daily EXCEPT 1 tablet on Sundays, Tuesdays and Thursdays. Continue eating broccoli every other day.  Recheck INR 3 weeks.  Anticoagulation Clinic-[phone].

## 2023-05-18 ENCOUNTER — Ambulatory Visit: Payer: Medicare Other | Admitting: Nurse Practitioner

## 2023-05-18 ENCOUNTER — Ambulatory Visit: Payer: Medicare Other

## 2023-05-19 ENCOUNTER — Inpatient Hospital Stay: Payer: Medicare Other | Admitting: Dietician

## 2023-05-19 NOTE — Progress Notes (Signed)
Nutrition Follow-up:  Pt with multiple myeloma and right lower lobe pulmonary nodule suspicious for non-small cell lung cancer. S/p SBRT under the care of Dr. Kathrynn Running (final 5/17). Patient is currently receiving chemotherapy with daratumumab + velcade (first 4/29) + zometa q28d. Revlimid has been on hold since June/May 2024. Velcade held starting cycle #7 due to PN and diarrhea.   Met with patient and wife in office. Patient reports improved appetite, however unable to gain weight. Diarrhea has resolved. Patient eating 2-3 meals as well as snacks. Recalls oatmeal or Wheaties with 2% milk for breakfast, snacked on greek yogurt. Recalls late lunch at [facility], eating all of his salmon, mashed potatoes, broccoli and roll. Patient had an Ensure and coffee cake for evening snack. Patient is drinking one Ensure Original most days. Patient is drinking a lot of water. He is asking if a glass of grape juice daily is okay. Patient reports neuropathy in hands and feet. He is working with [facility] PT once weekly to regain strength. Patient expresses worry that he will not obtain remission with modified treatment. He becomes tearful and reports emotions have been difficult to control. He often feels tearful for "no reason." Patient has attended a few blood cancer support group sessions. He did not feel he benefited from group setting. Patient expressed interest in meeting with LCSW for counseling.   Medications: reviewed   Labs: 10/7 - Na 134  Anthropometrics: Wt 169 lb 12.8 oz on 10/7  9/16 - 177 lb 9 oz  8/24 - 192 lb    NUTRITION DIAGNOSIS: Food and nutrition related knowledge deficit ongoing   INTERVENTION:  Continue eating 3 meals, recommend high calorie high protein snacks in between Recommend high protein snack following PT to support LBM Continue drinking Ensure, recommend 2/day (one Ensure Original + one Ensure plus/equivalent given they were given a case of Original)  Educated on fluids -  encouraged pt to enjoy grape juice  Educated on challenges of wt gain during treatment, encouraged wt maintenance vs focusing on wt increase Will send message to LCSW Ronda Fairly) regarding counseling  Support and encouragement    MONITORING, EVALUATION, GOAL: weight trends, intake   NEXT VISIT: To be scheduled as needed

## 2023-05-22 ENCOUNTER — Other Ambulatory Visit: Payer: Medicare Other

## 2023-05-22 ENCOUNTER — Ambulatory Visit: Payer: Medicare Other

## 2023-05-24 ENCOUNTER — Inpatient Hospital Stay: Payer: Medicare Other | Admitting: Licensed Clinical Social Worker

## 2023-05-24 DIAGNOSIS — C9000 Multiple myeloma not having achieved remission: Secondary | ICD-10-CM

## 2023-05-24 NOTE — Progress Notes (Signed)
[facility] CSW Counseling Note  Patient was referred by medical provider. Treatment type: Couples  Presenting Concerns: Patient and/or family reports the following symptoms/concerns: anxiety and depression Duration of problem: 2 months; Severity of problem: moderate   Orientation:oriented to person, place, time/date, situation, day of week, month of year, and year.   Affect: Appropriate Risk of harm to self or others: No plan to harm self or others  Patient and/or Family's Strengths/Protective Factors: Social connections, Social and Emotional competence, Concrete supports in place (healthy food, safe environments, etc.), Sense of purpose, and Physical Health (exercise, healthy diet, medication compliance, etc.)Ability for insight  Average or above average intelligence  Capable of independent living  Education administrator  Motivation for treatment/growth  Physical Health  Supportive family/friends      Goals Addressed: Patient will:  Reduce symptoms of: anxiety and depression Increase knowledge and/or ability of: coping skills  Increase healthy adjustment to current life circumstances   Progress towards Goals: Initial   Interventions: Interventions utilized:  CBT, Solution Focused, Strength-based, and Supportive      Assessment: Patient currently experiencing anxiety and depression.  Pt presents w/ his wife to discuss increased feelings of depression recently.  Pt's life has dramatically changed over the past year with regards to his physical ability.  Prior to diagnosis pt was golfing once a week and able to engage in most physical activities.  Pt presently uses a walker for ambulation and does not leave the house often.  Pt acknowledges part of the issue is his own perception of how his family and friends may view him now that he is not as physically capable.  CSW assisted pt w/ reframing his own perception as well as explore alternate ways to continue to be involved  in the activities he enjoys.  Pt given a worksheet to explore identity pre and post diagnosis.  CSW to go over the worksheet w/ pt at next session.        Plan: Follow up with CSW: when pt feels it is appropriate Behavioral recommendations: discuss w/ wife ideas to participate in activities pt has always enjoyed in a way that is physically possible for pt         Rachel Moulds, LCSW

## 2023-05-25 ENCOUNTER — Ambulatory Visit: Payer: Medicare Other

## 2023-06-01 ENCOUNTER — Encounter: Payer: Self-pay | Admitting: Internal Medicine

## 2023-06-01 ENCOUNTER — Inpatient Hospital Stay: Payer: Medicare Other

## 2023-06-01 DIAGNOSIS — C9000 Multiple myeloma not having achieved remission: Secondary | ICD-10-CM

## 2023-06-01 DIAGNOSIS — Z5112 Encounter for antineoplastic immunotherapy: Secondary | ICD-10-CM | POA: Diagnosis not present

## 2023-06-01 LAB — CMP (CANCER CENTER ONLY)
ALT: 68 U/L — ABNORMAL HIGH (ref 0–44)
AST: 33 U/L (ref 15–41)
Albumin: 3.9 g/dL (ref 3.5–5.0)
Alkaline Phosphatase: 64 U/L (ref 38–126)
Anion gap: 9 (ref 5–15)
BUN: 21 mg/dL (ref 8–23)
CO2: 25 mmol/L (ref 22–32)
Calcium: 8.9 mg/dL (ref 8.9–10.3)
Chloride: 101 mmol/L (ref 98–111)
Creatinine: 0.87 mg/dL (ref 0.61–1.24)
GFR, Estimated: >60 mL/min (ref >60)
Glucose, Bld: 110 mg/dL — ABNORMAL HIGH (ref 70–99)
Potassium: 4 mmol/L (ref 3.5–5.1)
Sodium: 135 mmol/L (ref 135–145)
Total Bilirubin: 1.7 mg/dL — ABNORMAL HIGH (ref 0.3–1.2)
Total Protein: 6 g/dL — ABNORMAL LOW (ref 6.5–8.1)

## 2023-06-01 LAB — CBC WITH DIFFERENTIAL (CANCER CENTER ONLY)
Abs Immature Granulocytes: 0.09 10*3/uL — ABNORMAL HIGH (ref 0.00–0.07)
Basophils Absolute: 0 10*3/uL (ref 0.0–0.1)
Basophils Relative: 0 %
Eosinophils Absolute: 0 10*3/uL (ref 0.0–0.5)
Eosinophils Relative: 0 %
HCT: 45.9 % (ref 39.0–52.0)
Hemoglobin: 15.7 g/dL (ref 13.0–17.0)
Immature Granulocytes: 1 %
Lymphocytes Relative: 7 %
Lymphs Abs: 0.7 10*3/uL (ref 0.7–4.0)
MCH: 35.3 pg — ABNORMAL HIGH (ref 26.0–34.0)
MCHC: 34.2 g/dL (ref 30.0–36.0)
MCV: 103.1 fL — ABNORMAL HIGH (ref 80.0–100.0)
Monocytes Absolute: 0.9 10*3/uL (ref 0.1–1.0)
Monocytes Relative: 8 %
Neutro Abs: 8.9 10*3/uL — ABNORMAL HIGH (ref 1.7–7.7)
Neutrophils Relative %: 84 %
Platelet Count: 128 10*3/uL — ABNORMAL LOW (ref 150–400)
RBC: 4.45 MIL/uL (ref 4.22–5.81)
RDW: 14 % (ref 11.5–15.5)
WBC Count: 10.7 10*3/uL — ABNORMAL HIGH (ref 4.0–10.5)
nRBC: 0 % (ref 0.0–0.2)

## 2023-06-01 LAB — LACTATE DEHYDROGENASE: LDH: 184 U/L (ref 98–192)

## 2023-06-02 ENCOUNTER — Encounter: Payer: Self-pay | Admitting: Internal Medicine

## 2023-06-02 LAB — KAPPA/LAMBDA LIGHT CHAINS
Kappa free light chain: 2 mg/L — ABNORMAL LOW (ref 3.3–19.4)
Kappa, lambda light chain ratio: 1.25 (ref 0.26–1.65)
Lambda free light chains: 1.6 mg/L — ABNORMAL LOW (ref 5.7–26.3)

## 2023-06-02 LAB — BETA 2 MICROGLOBULIN, SERUM: Beta-2 Microglobulin: 1.8 mg/L (ref 0.6–2.4)

## 2023-06-03 LAB — IGG, IGA, IGM
IgA: 7 mg/dL — ABNORMAL LOW (ref 61–437)
IgG (Immunoglobin G), Serum: 257 mg/dL — ABNORMAL LOW (ref 603–1613)
IgM (Immunoglobulin M), Srm: 16 mg/dL (ref 15–143)

## 2023-06-05 ENCOUNTER — Other Ambulatory Visit: Payer: Self-pay

## 2023-06-06 ENCOUNTER — Inpatient Hospital Stay (HOSPITAL_BASED_OUTPATIENT_CLINIC_OR_DEPARTMENT_OTHER): Payer: Medicare Other | Admitting: Internal Medicine

## 2023-06-06 ENCOUNTER — Encounter: Payer: Self-pay | Admitting: Internal Medicine

## 2023-06-06 ENCOUNTER — Inpatient Hospital Stay: Payer: Medicare Other

## 2023-06-06 ENCOUNTER — Encounter: Payer: Self-pay | Admitting: Medical Oncology

## 2023-06-06 VITALS — BP 105/87 | HR 69 | Temp 97.6°F | Resp 17 | Wt 167.5 lb

## 2023-06-06 DIAGNOSIS — C9000 Multiple myeloma not having achieved remission: Secondary | ICD-10-CM | POA: Diagnosis not present

## 2023-06-06 DIAGNOSIS — Z5112 Encounter for antineoplastic immunotherapy: Secondary | ICD-10-CM | POA: Diagnosis not present

## 2023-06-06 LAB — CMP (CANCER CENTER ONLY)
ALT: 49 U/L — ABNORMAL HIGH (ref 0–44)
AST: 24 U/L (ref 15–41)
Albumin: 3.8 g/dL (ref 3.5–5.0)
Alkaline Phosphatase: 60 U/L (ref 38–126)
Anion gap: 7 (ref 5–15)
BUN: 20 mg/dL (ref 8–23)
CO2: 25 mmol/L (ref 22–32)
Calcium: 9 mg/dL (ref 8.9–10.3)
Chloride: 105 mmol/L (ref 98–111)
Creatinine: 0.88 mg/dL (ref 0.61–1.24)
GFR, Estimated: >60 mL/min (ref >60)
Glucose, Bld: 119 mg/dL — ABNORMAL HIGH (ref 70–99)
Potassium: 4.3 mmol/L (ref 3.5–5.1)
Sodium: 137 mmol/L (ref 135–145)
Total Bilirubin: 1.6 mg/dL — ABNORMAL HIGH (ref 0.3–1.2)
Total Protein: 5.6 g/dL — ABNORMAL LOW (ref 6.5–8.1)

## 2023-06-06 LAB — CBC WITH DIFFERENTIAL (CANCER CENTER ONLY)
Abs Immature Granulocytes: 0.08 10*3/uL — ABNORMAL HIGH (ref 0.00–0.07)
Basophils Absolute: 0 10*3/uL (ref 0.0–0.1)
Basophils Relative: 0 %
Eosinophils Absolute: 0 10*3/uL (ref 0.0–0.5)
Eosinophils Relative: 0 %
HCT: 43.1 % (ref 39.0–52.0)
Hemoglobin: 15 g/dL (ref 13.0–17.0)
Immature Granulocytes: 1 %
Lymphocytes Relative: 6 %
Lymphs Abs: 0.6 10*3/uL — ABNORMAL LOW (ref 0.7–4.0)
MCH: 35.8 pg — ABNORMAL HIGH (ref 26.0–34.0)
MCHC: 34.8 g/dL (ref 30.0–36.0)
MCV: 102.9 fL — ABNORMAL HIGH (ref 80.0–100.0)
Monocytes Absolute: 0.5 10*3/uL (ref 0.1–1.0)
Monocytes Relative: 5 %
Neutro Abs: 8.6 10*3/uL — ABNORMAL HIGH (ref 1.7–7.7)
Neutrophils Relative %: 88 %
Platelet Count: 104 10*3/uL — ABNORMAL LOW (ref 150–400)
RBC: 4.19 MIL/uL — ABNORMAL LOW (ref 4.22–5.81)
RDW: 13.7 % (ref 11.5–15.5)
WBC Count: 9.9 10*3/uL (ref 4.0–10.5)
nRBC: 0 % (ref 0.0–0.2)

## 2023-06-06 MED ORDER — ACETAMINOPHEN 325 MG PO TABS
650.0000 mg | ORAL_TABLET | Freq: Once | ORAL | Status: AC
  Administered 2023-06-06: 650 mg via ORAL
  Filled 2023-06-06: qty 2

## 2023-06-06 MED ORDER — ZOLEDRONIC ACID 4 MG/100ML IV SOLN
4.0000 mg | Freq: Once | INTRAVENOUS | Status: AC
  Administered 2023-06-06: 4 mg via INTRAVENOUS
  Filled 2023-06-06: qty 100

## 2023-06-06 MED ORDER — DARATUMUMAB-HYALURONIDASE-FIHJ 1800-30000 MG-UT/15ML ~~LOC~~ SOLN
1800.0000 mg | Freq: Once | SUBCUTANEOUS | Status: AC
  Administered 2023-06-06: 1800 mg via SUBCUTANEOUS
  Filled 2023-06-06: qty 15

## 2023-06-06 MED ORDER — SODIUM CHLORIDE 0.9 % IV SOLN: Freq: Once | INTRAVENOUS | Status: AC

## 2023-06-06 MED ORDER — DIPHENHYDRAMINE HCL 25 MG PO CAPS
50.0000 mg | ORAL_CAPSULE | Freq: Once | ORAL | Status: AC
  Administered 2023-06-06: 50 mg via ORAL
  Filled 2023-06-06: qty 2

## 2023-06-06 NOTE — Progress Notes (Signed)
Patient seen by Dr. Gypsy Balsam are within treatment parameters.  Labs reviewed: and are not all within treatment parameters. Total Bilirubin = 1.6  Per physician team, patient is ready for treatment and there are NO modifications to the treatment plan.  Per Dr. Arbutus Ped ,it it ok to treat pt today with Daratumumab and total bilirubin =1.6.

## 2023-06-06 NOTE — Patient Instructions (Signed)
[facility]  Discharge Instructions: Thank you for choosing [facility] to provide your oncology and hematology care.   If you have a lab appointment with the Cancer Center, please go directly to the Cancer Center and check in at the registration area.   Wear comfortable clothing and clothing appropriate for easy access to any Portacath or PICC line.   We strive to give you quality time with your provider. You may need to reschedule your appointment if you arrive late (15 or more minutes).  Arriving late affects you and other patients whose appointments are after yours.  Also, if you miss three or more appointments without notifying the office, you may be dismissed from the clinic at the provider's discretion.      For prescription refill requests, have your pharmacy contact our office and allow 72 hours for refills to be completed.    Today you received the following chemotherapy and/or immunotherapy agents: Darzalex Faspro, Zometa     To help prevent nausea and vomiting after your treatment, we encourage you to take your nausea medication as directed.  BELOW ARE SYMPTOMS THAT SHOULD BE REPORTED IMMEDIATELY: *FEVER GREATER THAN 100.4 F (38 C) OR HIGHER *CHILLS OR SWEATING *NAUSEA AND VOMITING THAT IS NOT CONTROLLED WITH YOUR NAUSEA MEDICATION *UNUSUAL SHORTNESS OF BREATH *UNUSUAL BRUISING OR BLEEDING *URINARY PROBLEMS (pain or burning when urinating, or frequent urination) *BOWEL PROBLEMS (unusual diarrhea, constipation, pain near the anus) TENDERNESS IN MOUTH AND THROAT WITH OR WITHOUT PRESENCE OF ULCERS (sore throat, sores in mouth, or a toothache) UNUSUAL RASH, SWELLING OR PAIN  UNUSUAL VAGINAL DISCHARGE OR ITCHING   Items with * indicate a potential emergency and should be followed up as soon as possible or go to the Emergency Department if any problems should occur.  Please show the CHEMOTHERAPY ALERT CARD or IMMUNOTHERAPY ALERT  CARD at check-in to the Emergency Department and triage nurse.  Should you have questions after your visit or need to cancel or reschedule your appointment, please contact [facility]  Dept: [phone]  and follow the prompts.  Office hours are 8:00 a.m. to 4:30 p.m. Monday - Friday. Please note that voicemails left after 4:00 p.m. may not be returned until the following business day.  We are closed weekends and major holidays. You have access to a nurse at all times for urgent questions. Please call the main number to the clinic Dept: [phone] and follow the prompts.   For any non-urgent questions, you may also contact your provider using MyChart. We now offer e-Visits for anyone 44 and older to request care online for non-urgent symptoms. For details visit [email or web address].   Also download the MyChart app! Go to the app store, search "MyChart", open the app, select [facility], and log in with your MyChart username and password.

## 2023-06-06 NOTE — Progress Notes (Signed)
Patient took steroids for treatment at home at 0645.

## 2023-06-06 NOTE — Progress Notes (Signed)
[facility] [phone]   Fax:([phone]) [phone]  OFFICE PROGRESS NOTE  Ronald Has, MD 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 Kathrynn Running.  CURRENT THERAPY:  1) Systemic chemotherapy with daratumumab subcutaneously weekly in addition to Velcade 1.3 Mg/M2 on days 1, 4, 8, 11 every 3 weeks as well as Revlimid 25 mg p.o. daily for 14 days every 3 weeks and Decadron 20 mg p.o. weekly with chemotherapy.  First cycle expected on 12/05/2022.  Status post 8 cycles.  Velcade and Revlimid were discontinued secondary to significant peripheral neuropathy and pancytopenia.  He is currently on maintenance treatment with daratumumab every 4 weeks in addition to Decadron 20 mg p.o. on weekly basis. 2) Zometa 4 mg IV infusion every 4 weeks.  INTERVAL HISTORY: [place] MINTEN 82 y.o. male returns to the clinic today for follow-up visit accompanied by his wife.Discussed the use of AI scribe software for clinical note transcription with the patient, who gave verbal consent to proceed.  History of Present Illness   The patient, a 82 year old with a history of multiple myeloma, was last seen in March 2024. He was initiated on chemotherapy at that time, including subcutaneous daratumumab, Velcade, Revlimid, and dexamethasone. However, the Revlimid was discontinued due to intolerance, and the Velcade was also stopped. Currently, the patient is on daratumumab and dexamethasone.  The patient reports feeling well overall, with the exception of neuropathy, which he attributes to the chemotherapy. He notes that his back pain Duran improved, and he Duran been sleeping better. He Duran been sleeping  in a recliner and reports no significant pain other than the neuropathy.  The patient is also on warfarin for DVT prophylaxis. He uses a walking aid for support to prevent falls. He had a bout of significant diarrhea a few weeks ago, but this Duran since resolved. He is due to start cycle number nine of his chemotherapy regimen.       MEDICAL HISTORY: Past Medical History:  Diagnosis Date   Atrial fibrillation (HCC)    CAD (coronary artery disease)    a. s/p MI with LAD stent placement December, 1999 -last stress test November 2008 with no EKG evidence of ischemia  b. cath 07/23/2015 after abnormal ETT and nuc, occluded mid LAD stent with good distal collateral, medical therapy    Cardiomyopathy (HCC)    Ischemic cardiomyopathy with EF 40% by echo 2005   COVID 2021   mild case   Dyslipidemia    Erectile dysfunction    GERD (gastroesophageal reflux disease)    Hiatal hernia    with esophageal strictures and dilatations in the past - Dr. [name]   History of nuclear stress test    Myoview 12/16: EF 47%, anterior, anteroseptal and inferoseptal defect suggesting infarct with peri-infarct ischemia, intermediate risk   HTN (hypertension)    Hx of seasonal allergies    Hyperlipidemia    Myocardial infarction (HCC) 08/01/1998   PAD (peripheral artery disease) (HCC)    Pre-diabetes    Prostate cancer (HCC)    with seed implant therapy following Lupron therapy in 2008 and 2009 - followed by Dr. Pollyann Glen    Stroke (  HCC) 04/20/2017   seen on a MRI   Vitamin D deficiency     ALLERGIES:  Duran No Known Allergies.  MEDICATIONS:  Current Outpatient Medications  Medication Sig Dispense Refill   acyclovir (ZOVIRAX) 200 MG capsule Take 1 capsule (200 mg total) by mouth 2 (two) times daily. 60 capsule 5   atorvastatin (LIPITOR) 40 MG tablet Take 40 mg by mouth at bedtime.     B Complex Vitamins (B COMPLEX 100) tablet Take 1 tablet by mouth daily. Balance     cetirizine (ZYRTEC  ALLERGY) 10 MG tablet Take 1 tablet (10 mg total) by mouth daily. 30 tablet 0   Cholecalciferol (VITAMIN D3) 2000 UNITS TABS Take 2,000 Units by mouth daily.     Coenzyme Q10 (CO Q-10) 100 MG CAPS Take 100 mg by mouth daily with supper.     dexamethasone (DECADRON) 4 MG tablet 5 tab weekly start with the first dose of chemotherapy. (Patient taking differently: Take 20 mg by mouth See admin instructions. Take 5 tablets by mouth weekly start with the first dose of chemotherapy. Patient states he did take today because he thought maybe he would have Chemo today per patient) 40 tablet 3   ketoconazole (NIZORAL) 2 % cream Apply 1 Application topically daily as needed for irritation.     lenalidomide (REVLIMID) 15 MG capsule Take 1 capsule (15 mg total) by mouth daily. Take for 14 days, then none for 7 days. Repeat every 21 days. Start Revlimid on day 1 of chemotherapy Auth #=[id number] date obtained 12/19/2022 Adult Male. (Patient not taking: Reported on 02/03/2023) 14 capsule 0   loperamide (IMODIUM) 2 MG capsule Take 1 capsule (2 mg total) by mouth every 6 (six) hours as needed for diarrhea or loose stools. 30 capsule 0   metoprolol tartrate (LOPRESSOR) 25 MG tablet Take 0.5 tablets (12.5 mg total) by mouth 2 (two) times daily. 90 tablet 3   Multiple Vitamins-Minerals (CENTRUM) tablet Take 1 tablet by mouth daily.     nitroGLYCERIN (NITROSTAT) 0.4 MG SL tablet PLACE 1 TABLET UNDER THE TONGUE IF NEEDED EVERY 5 MINUTES FOR CHEST PAIN FOR 3 DOSES IF NO RELIEF AFTER FIRST DOSE CALL 911. (Patient taking differently: Place 0.4 mg under the tongue every 5 (five) minutes as needed for chest pain.) 25 tablet 9   omeprazole (PRILOSEC) 20 MG capsule Take 20 mg by mouth daily before breakfast.     prochlorperazine (COMPAZINE) 10 MG tablet Take 1 tablet (10 mg total) by mouth every 6 (six) hours as needed for nausea or vomiting. 30 tablet 1   traMADol (ULTRAM) 50 MG tablet Take 1 tablet (50 mg total) by mouth every 12  (twelve) hours as needed. (Patient taking differently: Take 50 mg by mouth every 12 (twelve) hours as needed for moderate pain.) 30 tablet 0   triamcinolone ointment (KENALOG) 0.5 % Apply 1 Application topically 2 (two) times daily. (Patient taking differently: Apply 1 Application topically daily as needed (For left foot rash).) 30 g 0   warfarin (COUMADIN) 5 MG tablet TAKE 1/2 TO 1 TABLET BY MOUTH AS DIRECTED by coumadin clinic 30 tablet 1   No current facility-administered medications for this visit.    SURGICAL HISTORY:  Past Surgical History:  Procedure Laterality Date   BRONCHIAL BIOPSY  11/15/2022   Procedure: BRONCHIAL BIOPSIES;  Surgeon: [name], DO;  Location: [facility];  Service: Pulmonary;;   BRONCHIAL BRUSHINGS  11/15/2022   Procedure: BRONCHIAL BRUSHINGS;  Surgeon: [name],  DO;  Location: [facility];  Service: Pulmonary;;   BRONCHIAL NEEDLE ASPIRATION BIOPSY  11/15/2022   Procedure: BRONCHIAL NEEDLE ASPIRATION BIOPSIES;  Surgeon: [name], DO;  Location: [facility];  Service: Pulmonary;;   CARDIAC CATHETERIZATION N/A 07/23/2015   Procedure: Left Heart Cath and Coronary Angiography;  Surgeon: [name], MD;  Location: [facility] INVASIVE CV LAB;  Service: Cardiovascular;  Laterality: N/A;   CARDIOVERSION N/A 06/03/2022   Procedure: CARDIOVERSION;  Surgeon: [name], MD;  Location: [facility] ENDOSCOPY;  Service: Cardiovascular;  Laterality: N/A;   COLONOSCOPY WITH PROPOFOL N/A 11/24/2015   Procedure: COLONOSCOPY WITH PROPOFOL;  Surgeon: [name], MD;  Location: [facility] ENDOSCOPY;  Service: Endoscopy;  Laterality: N/A;   esophageal strictures with dilatations in the past per Dr. [name]     last '98   FIDUCIAL MARKER PLACEMENT  11/15/2022   Procedure: FIDUCIAL MARKER PLACEMENT;  Surgeon: [name], DO;  Location: [facility];  Service: Pulmonary;;   HERNIA REPAIR Left    INTRAMEDULLARY (IM) NAIL INTERTROCHANTERIC Left 10/25/2022   Procedure: LEFT  INTRAMEDULLARY (IM) NAIL INTERTROCHANTERIC;  Surgeon: [name], MD;  Location: [facility] OR;  Service: Orthopedics;  Laterality: Left;   IR BONE MARROW BIOPSY & ASPIRATION  11/17/2022   IR BONE TUMOR(S)RF ABLATION  10/11/2022   IR KYPHO THORACIC WITH BONE BIOPSY  10/11/2022   LAD stent placement - Dr. [name]     left inguinal hernia repair - Dr. [name]     prostatic radioactive seed implantation - Dr. [name]     '09    REVIEW OF SYSTEMS:  Constitutional: positive for fatigue Eyes: negative Ears, nose, mouth, throat, and face: negative Respiratory: negative Cardiovascular: negative Gastrointestinal: negative Genitourinary:negative Integument/breast: negative Hematologic/lymphatic: negative Musculoskeletal:positive for arthralgias and back pain Neurological: negative Behavioral/Psych: negative Endocrine: negative Allergic/Immunologic: negative   PHYSICAL EXAMINATION: General appearance: alert, cooperative, fatigued, and no distress Head: Normocephalic, without obvious abnormality, atraumatic Neck: no adenopathy, no JVD, supple, symmetrical, trachea midline, and thyroid not enlarged, symmetric, no tenderness/mass/nodules Lymph nodes: Cervical, supraclavicular, and axillary nodes normal. Resp: clear to auscultation bilaterally Back: symmetric, no curvature. ROM normal. No CVA tenderness. Cardio: regular rate and rhythm, S1, S2 normal, no murmur, click, rub or gallop GI: soft, non-tender; bowel sounds normal; no masses,  no organomegaly Extremities: extremities normal, atraumatic, no cyanosis or edema Neurologic: Alert and oriented X 3, normal strength and tone. Normal symmetric reflexes. Normal coordination and gait  ECOG PERFORMANCE STATUS: 1 - Symptomatic but completely ambulatory  Blood pressure 105/87, pulse 69, temperature 97.6 F (36.4 C), temperature source Oral, resp. rate 17, weight 167 lb 8 oz (76 kg), SpO2 99%.  LABORATORY DATA: Lab Results  Component Value Date    WBC 9.9 06/06/2023   HGB 15.0 06/06/2023   HCT 43.1 06/06/2023   MCV 102.9 (H) 06/06/2023   PLT 104 (L) 06/06/2023      Chemistry      Component Value Date/Time   NA 135 06/01/2023 1337   NA 141 08/25/2022 1440   K 4.0 06/01/2023 1337   CL 101 06/01/2023 1337   CO2 25 06/01/2023 1337   BUN 21 06/01/2023 1337   BUN 14 08/25/2022 1440   CREATININE 0.87 06/01/2023 1337   CREATININE 1.23 (H) 07/21/2015 0917      Component Value Date/Time   CALCIUM 8.9 06/01/2023 1337   ALKPHOS 64 06/01/2023 1337   AST 33 06/01/2023 1337   ALT 68 (H) 06/01/2023 1337  BILITOT 1.7 (H) 06/01/2023 1337       RADIOGRAPHIC STUDIES: No results found.  ASSESSMENT AND PLAN: This is a very pleasant 82 years old white male with multiple myeloma presented with multiple lytic lesions involving T10, left femoral neck as well as T4, T9 and T11 vertebral bodies. Also had right lower lobe lung nodule that was suspicious for non-small cell lung cancer. 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 The biopsy from the left hip was negative for malignancy. His myeloma panel showed no concerning findings except for elevated IgG but the serum light chains were normal. The patient underwent left hip fixation for impending fracture. He also underwent palliative radiotherapy under the care of Dr. Kathrynn Running. His recent bone marrow biopsy and aspirate was consistent with plasma cell neoplasm with involvement of plasma cells 25% of the bone marrow. The patient is currently on treatment with daratumumab and dexamethasone started on 12/05/2022 status post 8 cycles.  Velcade and Revlimid were discontinued secondary to adverse effect and intolerance.  He had repeat myeloma panel that showed no concerning findings for progression and he continues to have good response to the treatment.    Multiple Myeloma Patient is  currently on cycle 9 of Daratumumab and Decadron. He reports neuropathy, likely secondary to previous Velcade treatment. His myeloma panel results are favorable, indicating effective treatment. -Continue Daratumumab and Decadron regimen. -Check myeloma panel in 4 weeks.  Neuropathy Likely secondary to chemotherapy (Velcade). Patient is aware and managing. -No change in management.  DVT Prophylaxis Patient is on Warfarin for DVT prophylaxis. -Continue Warfarin as directed by Coumadin clinic.  General Health Maintenance -Continue Zemaira infusions as scheduled. -Return for follow-up and treatment in 4 weeks.    For the suspicious right lower lobe lung cancer, he underwent SBRT under the care of Dr. Kathrynn Running. The patient was advised to call immediately if he Duran any other concerning symptoms in the interval. The patient voices understanding of current disease status and treatment options and is in agreement with the current care plan.  All questions were answered. The patient knows to call the clinic with any problems, questions or concerns. We can certainly see the patient much sooner if necessary.  The total time spent in the appointment was 30 minutes.  Disclaimer: This note was dictated with voice recognition software. Similar sounding words can inadvertently be transcribed and may not be corrected upon review.

## 2023-06-07 ENCOUNTER — Telehealth: Payer: Self-pay | Admitting: Medical Oncology

## 2023-06-07 ENCOUNTER — Ambulatory Visit: Payer: Medicare Other | Attending: Internal Medicine | Admitting: *Deleted

## 2023-06-07 DIAGNOSIS — Z79899 Other long term (current) drug therapy: Secondary | ICD-10-CM

## 2023-06-07 DIAGNOSIS — Z7901 Long term (current) use of anticoagulants: Secondary | ICD-10-CM

## 2023-06-07 DIAGNOSIS — I6349 Cerebral infarction due to embolism of other cerebral artery: Secondary | ICD-10-CM

## 2023-06-07 DIAGNOSIS — I4819 Other persistent atrial fibrillation: Secondary | ICD-10-CM | POA: Diagnosis not present

## 2023-06-07 LAB — POCT INR: INR: 2.5 (ref 2.0–3.0)

## 2023-06-07 NOTE — Addendum Note (Signed)
Addended by: Charma Igo on: 06/07/2023 05:25 PM   Modules accepted: Orders

## 2023-06-07 NOTE — Telephone Encounter (Signed)
Lab appt scheduled for next myeloma panel.

## 2023-06-07 NOTE — Patient Instructions (Signed)
Description   Continue taking warfarin 1/2 tablet daily EXCEPT 1 tablet on Sundays, Tuesdays and Thursdays. Continue eating broccoli every other day.  Recheck INR 4 weeks.  Anticoagulation Clinic-[phone].

## 2023-06-08 ENCOUNTER — Ambulatory Visit: Payer: Medicare Other

## 2023-06-12 ENCOUNTER — Ambulatory Visit: Payer: Medicare Other

## 2023-06-12 ENCOUNTER — Other Ambulatory Visit: Payer: Medicare Other

## 2023-06-13 ENCOUNTER — Other Ambulatory Visit: Payer: Self-pay

## 2023-06-15 ENCOUNTER — Ambulatory Visit: Payer: Medicare Other

## 2023-06-15 NOTE — Progress Notes (Signed)
Cardiology Office Note:  .   Date:  06/16/2023  ID:  Ronald Duran, DOB 10-11-1940, MRN [id number] PCP: Farris Has, MD  [facility] Providers Cardiologist:  Charlton Haws, MD    Patient Profile: .      PMH Persistent atrial fibrillation on chronic anticoagulation with Coumadin Coronary artery disease MI with LAD stent placement December 1999 Abnormal nuclear stress test 07/2015 LHC 07/2015 Total occlusion of mid LAD after the first diagonal within the previously stented segment Well-developed apical collaterals from the dominant right coronary Widely patent dominant RCA and circumflex arteries Ischemic cardiomyopathy EF 40% by echo 2005 Akinesis of the apical segment with intramyocardial calcification EF 45 to 50% on angiography 07/2015 TTE 01/25/23 EF 35-40, no rwms, moderately dilated LV, akinesis of apical segment, apex is aneurysmal with possible small layered mural thrombus, normal RV 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  He has maintained consistent follow-up.  LV function remained in the range of 40-45 for several years and he remained very active.  Atrial fibrillation detected incidentally in September 2023 by PCP.  He was already on anticoagulation with Coumadin due to prior CVA/TIA presumed secondary to LV apical thrombus.  Had DCCV 06/03/2022 but quickly reverted back to AF.  Seen by Dr. Hyacinth Meeker on 07/05/2022 and was scheduled to undergo A-fib ablation 08/2022.  Concern for bronchogenic neoplasm identified on CT on 08/30/2022 and ablation was canceled.  He was urgently referred to pulmonology.  PET scan identified suspicious osseous findings.  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  He was referred to oncology.  He was ultimately diagnosed with multiple myeloma and right lower lobe pulmonary nodule suspicious for non-small cell lung cancer.  Admission 6/18-6/22/24 for AF RVR from the cancer center.  TTE 01/25/23 revealed LVEF 35 to 40%, no RWMA, akinesis of left ventricle, inferior wall, lateral wall, and anterior wall, akinesis of apical segment with aneurysmal area concerning for thrombus, normal RV size and function, aortic valve sclerosis with no evidence of stenosis.  Echo reviewed by Dr. Admission who did not feel consistent with mural thrombus.  He was found to have PNA felt to be contributing to elevated HR.  Was on digoxin briefly during admission but concern for toxicity with chemo.  He was discharged on metoprolol 50 mg twice daily for rate control.  Last cardiology clinic visit was 02/03/2023 with me for hospital follow-up.  He had undergone hip stabilization surgery and reported being less active and having LLE edema, wearing compression stockings daily. Home BP was stable. He completed XRT and chemo for multiple myeloma and XRT for lung Ca. Was undergoing immunotherapy treatment for lung cancer but was unsure of future plans for tx of myeloma. He was asymptomatic with a fib. 3 month f/u was recommended.        History of Present Illness: .   Ronald Duran is a very pleasant 82 y.o. male who is here with his wife for follow-up. He is doing well.  Uses a rolling walker for stability.  Has been closely monitoring BP since reducing metoprolol to 12.5 mg BID a few weeks ago. Has soft BP readings at times but he has been asymptomatic with these. Feels drowsy at times,  which he attributes to cancer treatment. Has mild swelling in his left foot, which is thought to be a residual effect of a previous hip surgery. He expresses frustration about his limited physical activity due to his health conditions. He used to be very active, playing tennis and golf, and  walking three miles a day. Since diagnoses of cancer, he has had to significantly reduce his physical activity. He continues to seek ways to stay active. He has occasional elevations in HR in the low 100s. This generally dissipates quickly. He lost weight following hospitalization and has been working on gaining it back.  He has a good appetite. On Decadron and Darzalex for his multiple myeloma.  He denies shortness of breath, chest pain, orthopnea, PND, presyncope, syncope.   Discussed the use of AI scribe software for clinical note transcription with the patient, who gave verbal consent to proceed.   ROS: See HPI       Studies Reviewed: .        Risk Assessment/Calculations:    CHA2DS2-VASc Score = 7   This indicates a 11.2% annual risk of stroke. The patient's score is based upon: CHF History: 1 HTN History: 1 Diabetes History: 0 Stroke History: 2 Vascular Disease History: 1 Age Score: 2 Gender Score: 0            Physical Exam:   VS:  BP 102/60 (BP Location: Left Arm, Patient Position: Sitting, Cuff Size: Normal)   Pulse 93   Resp 16   Ht 6' (1.829 m)   Wt 165 lb 12.8 oz (75.2 kg)   SpO2 98%   BMI 22.49 kg/m    Wt Readings from Last 3 Encounters:  06/16/23 165 lb 12.8 oz (75.2 kg)  06/06/23 167 lb 8 oz (76 kg)  05/15/23 169 lb 12.8 oz (77 kg)    GEN: Well nourished, well developed in no acute distress NECK: No JVD; No carotid bruits CARDIAC: Irregular RR, no murmurs, rubs, gallops RESPIRATORY:  Clear to auscultation without rales, wheezing or rhonchi  ABDOMEN: Soft, non-tender, non-distended EXTREMITIES:  Mild LLE edema; No deformity     ASSESSMENT AND PLAN: .    CAD without angina: History of MI with LAD stent placement December 1999, [facility] 07/2015 following abnormal stress test which revealed total occlusion mLAD after the first diagonal within the previously stented segment, well-developed apical collaterals from the dominant right coronary, and widely patent  dominant RCA and circumflex arteries. He denies chest pain, dyspnea, or other symptoms concerning for angina.  No indication for further ischemic evaluation at this time. Continue atorvastatin, metoprolol.  Hyperlipidemia LDL goal < 70: LDL-C 66 on 05/02/2022. Not specifically addressed today. Consider repeat lipid panel at next office visit.  Continue atorvastatin.  ICM/chronic HFrEF:  LVEF 35-40%, mildly dilated LV, akinesis of left ventricle, inferior wall, lateral wall, and anterior wall with possible thrombus reported by reader, reviewed by Dr. Eden Emms during hospitalization 01/2023 and felt not to be significant. Volume status is stable.  Weight is stable.  He is working to gain some weight and has good appetite.  He limits fluids to 64 oz or <. He has chronic LLE edema since hip surgery that is mild and is unchanged.  He denies shortness of breath, orthopnea, PND.  Continue compression and leg elevation.    Hypertension: Blood pressure readings not significantly changed on lower dose of Metoprolol. No symptoms of dizziness or hypotension reported. Occasional drowsiness, possibly related to cancer treatment medication. We will continue metoprolol  12.5 mg BID. Advised him to hold metoprolol if SBP is 90 or lower.   Persistent atrial fibrillation on chronic anticoagulation: Heart rate well-controlled on current dose of Metoprolol. Occasional heart rate over 100, short lived.  He is on Coumadin for stroke prevention for CHA2DS2-VASc score of 7.  No bleeding concerns. Continue low dose metoprolol for rate control.   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 Overall doing well with good nutritional status. Management per oncology.        Dispo: 6 months with Dr. Eden Emms or me  Signed, Eligha Bridegroom, NP-C

## 2023-06-16 ENCOUNTER — Ambulatory Visit: Payer: Medicare Other | Attending: Nurse Practitioner | Admitting: Nurse Practitioner

## 2023-06-16 ENCOUNTER — Encounter: Payer: Self-pay | Admitting: Nurse Practitioner

## 2023-06-16 VITALS — BP 102/60 | HR 93 | Resp 16 | Ht 72.0 in | Wt 165.8 lb

## 2023-06-16 DIAGNOSIS — I4819 Other persistent atrial fibrillation: Secondary | ICD-10-CM | POA: Diagnosis not present

## 2023-06-16 DIAGNOSIS — C9000 Multiple myeloma not having achieved remission: Secondary | ICD-10-CM

## 2023-06-16 DIAGNOSIS — Z7901 Long term (current) use of anticoagulants: Secondary | ICD-10-CM | POA: Diagnosis not present

## 2023-06-16 DIAGNOSIS — I255 Ischemic cardiomyopathy: Secondary | ICD-10-CM

## 2023-06-16 DIAGNOSIS — C3491 Malignant neoplasm of unspecified part of right bronchus or lung: Secondary | ICD-10-CM

## 2023-06-16 DIAGNOSIS — I5022 Chronic systolic (congestive) heart failure: Secondary | ICD-10-CM

## 2023-06-16 DIAGNOSIS — I10 Essential (primary) hypertension: Secondary | ICD-10-CM

## 2023-06-16 DIAGNOSIS — I2510 Atherosclerotic heart disease of native coronary artery without angina pectoris: Secondary | ICD-10-CM

## 2023-06-16 NOTE — Patient Instructions (Signed)
Medication Instructions:   Your physician recommends that you continue on your current medications as directed. Please refer to the Current Medication list given to you today.   *If you need a refill on your cardiac medications before your next appointment, please call your pharmacy*   Lab Work:  None ordered.  If you have labs (blood work) drawn today and your tests are completely normal, you will receive your results only by: MyChart Message (if you have MyChart) OR A paper copy in the mail If you have any lab test that is abnormal or we need to change your treatment, we will call you to review the results.   Testing/Procedures:  None ordered.   Follow-Up: At [facility], you and your health needs are our priority.  As part of our continuing mission to provide you with exceptional heart care, we have created designated Provider Care Teams.  These Care Teams include your primary Cardiologist (physician) and Advanced Practice Providers (APPs -  Physician Assistants and Nurse Practitioners) who all work together to provide you with the care you need, when you need it.  We recommend signing up for the patient portal called "MyChart".  Sign up information is provided on this After Visit Summary.  MyChart is used to connect with patients for Virtual Visits (Telemedicine).  Patients are able to view lab/test results, encounter notes, upcoming appointments, etc.  Non-urgent messages can be sent to your provider as well.   To learn more about what you can do with MyChart, go to [email or web address].    Your next appointment:   6 month(s)  Provider:   Charlton Haws, MD  or Lebron Conners   Other Instructions  Your physician wants you to follow-up in: 6 months.  You will receive a reminder letter in the mail two months in advance. If you don't receive a letter, please call our office to schedule the follow-up appointment.  PLEASE HOLD YOUR LOPRESSOR IF YOUR TOP NUMBER  (SYSTOLIC) OF YOUR BLOOD PRESSURE IS 90 OR BELOW.

## 2023-06-26 ENCOUNTER — Other Ambulatory Visit: Payer: Medicare Other

## 2023-06-26 ENCOUNTER — Ambulatory Visit: Payer: Medicare Other

## 2023-06-26 ENCOUNTER — Ambulatory Visit: Payer: Medicare Other | Admitting: Internal Medicine

## 2023-06-28 ENCOUNTER — Other Ambulatory Visit: Payer: Self-pay | Admitting: Medical Oncology

## 2023-06-28 ENCOUNTER — Inpatient Hospital Stay: Payer: Medicare Other | Attending: Internal Medicine

## 2023-06-28 ENCOUNTER — Other Ambulatory Visit: Payer: Self-pay | Admitting: Internal Medicine

## 2023-06-28 DIAGNOSIS — C9000 Multiple myeloma not having achieved remission: Secondary | ICD-10-CM | POA: Diagnosis present

## 2023-06-28 DIAGNOSIS — Z5112 Encounter for antineoplastic immunotherapy: Secondary | ICD-10-CM | POA: Insufficient documentation

## 2023-06-28 LAB — CMP (CANCER CENTER ONLY)
ALT: 65 U/L — ABNORMAL HIGH (ref 0–44)
AST: 31 U/L (ref 15–41)
Albumin: 3.8 g/dL (ref 3.5–5.0)
Alkaline Phosphatase: 61 U/L (ref 38–126)
Anion gap: 5 (ref 5–15)
BUN: 22 mg/dL (ref 8–23)
CO2: 28 mmol/L (ref 22–32)
Calcium: 9 mg/dL (ref 8.9–10.3)
Chloride: 107 mmol/L (ref 98–111)
Creatinine: 1.02 mg/dL (ref 0.61–1.24)
GFR, Estimated: >60 mL/min (ref >60)
Glucose, Bld: 78 mg/dL (ref 70–99)
Potassium: 3.7 mmol/L (ref 3.5–5.1)
Sodium: 140 mmol/L (ref 135–145)
Total Bilirubin: 1.1 mg/dL (ref <1.2)
Total Protein: 5.7 g/dL — ABNORMAL LOW (ref 6.5–8.1)

## 2023-06-28 LAB — CBC WITH DIFFERENTIAL (CANCER CENTER ONLY)
Abs Immature Granulocytes: 0.13 10*3/uL — ABNORMAL HIGH (ref 0.00–0.07)
Basophils Absolute: 0.1 10*3/uL (ref 0.0–0.1)
Basophils Relative: 1 %
Eosinophils Absolute: 0 10*3/uL (ref 0.0–0.5)
Eosinophils Relative: 0 %
HCT: 44.5 % (ref 39.0–52.0)
Hemoglobin: 15.5 g/dL (ref 13.0–17.0)
Immature Granulocytes: 1 %
Lymphocytes Relative: 7 %
Lymphs Abs: 0.9 10*3/uL (ref 0.7–4.0)
MCH: 36.9 pg — ABNORMAL HIGH (ref 26.0–34.0)
MCHC: 34.8 g/dL (ref 30.0–36.0)
MCV: 106 fL — ABNORMAL HIGH (ref 80.0–100.0)
Monocytes Absolute: 1.2 10*3/uL — ABNORMAL HIGH (ref 0.1–1.0)
Monocytes Relative: 9 %
Neutro Abs: 10.7 10*3/uL — ABNORMAL HIGH (ref 1.7–7.7)
Neutrophils Relative %: 82 %
Platelet Count: 111 10*3/uL — ABNORMAL LOW (ref 150–400)
RBC: 4.2 MIL/uL — ABNORMAL LOW (ref 4.22–5.81)
RDW: 14.6 % (ref 11.5–15.5)
WBC Count: 12.9 10*3/uL — ABNORMAL HIGH (ref 4.0–10.5)
nRBC: 0 % (ref 0.0–0.2)

## 2023-06-28 LAB — LACTATE DEHYDROGENASE: LDH: 144 U/L (ref 98–192)

## 2023-06-29 ENCOUNTER — Ambulatory Visit: Payer: Medicare Other

## 2023-06-29 LAB — BETA 2 MICROGLOBULIN, SERUM: Beta-2 Microglobulin: 1.6 mg/L (ref 0.6–2.4)

## 2023-06-29 LAB — KAPPA/LAMBDA LIGHT CHAINS
Kappa free light chain: 1.7 mg/L — ABNORMAL LOW (ref 3.3–19.4)
Kappa, lambda light chain ratio: 1.06 (ref 0.26–1.65)
Lambda free light chains: 1.6 mg/L — ABNORMAL LOW (ref 5.7–26.3)

## 2023-06-30 ENCOUNTER — Other Ambulatory Visit: Payer: Self-pay | Admitting: Internal Medicine

## 2023-06-30 LAB — IGG, IGA, IGM
IgA: 5 mg/dL — ABNORMAL LOW (ref 61–437)
IgG (Immunoglobin G), Serum: 233 mg/dL — ABNORMAL LOW (ref 603–1613)
IgM (Immunoglobulin M), Srm: 13 mg/dL — ABNORMAL LOW (ref 15–143)

## 2023-07-03 ENCOUNTER — Inpatient Hospital Stay: Payer: Medicare Other

## 2023-07-03 ENCOUNTER — Encounter: Payer: Self-pay | Admitting: Medical Oncology

## 2023-07-03 ENCOUNTER — Encounter: Payer: Self-pay | Admitting: Internal Medicine

## 2023-07-03 ENCOUNTER — Inpatient Hospital Stay (HOSPITAL_BASED_OUTPATIENT_CLINIC_OR_DEPARTMENT_OTHER): Payer: Medicare Other | Admitting: Internal Medicine

## 2023-07-03 ENCOUNTER — Encounter: Payer: Self-pay | Admitting: Physician Assistant

## 2023-07-03 ENCOUNTER — Other Ambulatory Visit: Payer: Medicare Other

## 2023-07-03 VITALS — BP 107/75 | HR 65 | Temp 97.4°F | Resp 16 | Ht 72.0 in | Wt 166.9 lb

## 2023-07-03 DIAGNOSIS — C9000 Multiple myeloma not having achieved remission: Secondary | ICD-10-CM

## 2023-07-03 DIAGNOSIS — Z5112 Encounter for antineoplastic immunotherapy: Secondary | ICD-10-CM | POA: Diagnosis not present

## 2023-07-03 LAB — CBC WITH DIFFERENTIAL (CANCER CENTER ONLY)
Abs Immature Granulocytes: 0.09 10*3/uL — ABNORMAL HIGH (ref 0.00–0.07)
Basophils Absolute: 0 10*3/uL (ref 0.0–0.1)
Basophils Relative: 0 %
Eosinophils Absolute: 0 10*3/uL (ref 0.0–0.5)
Eosinophils Relative: 0 %
HCT: 43.6 % (ref 39.0–52.0)
Hemoglobin: 14.7 g/dL (ref 13.0–17.0)
Immature Granulocytes: 1 %
Lymphocytes Relative: 1 %
Lymphs Abs: 0.2 10*3/uL — ABNORMAL LOW (ref 0.7–4.0)
MCH: 35.8 pg — ABNORMAL HIGH (ref 26.0–34.0)
MCHC: 33.7 g/dL (ref 30.0–36.0)
MCV: 106.1 fL — ABNORMAL HIGH (ref 80.0–100.0)
Monocytes Absolute: 0.7 10*3/uL (ref 0.1–1.0)
Monocytes Relative: 4 %
Neutro Abs: 14.3 10*3/uL — ABNORMAL HIGH (ref 1.7–7.7)
Neutrophils Relative %: 94 %
Platelet Count: 92 10*3/uL — ABNORMAL LOW (ref 150–400)
RBC: 4.11 MIL/uL — ABNORMAL LOW (ref 4.22–5.81)
RDW: 14.5 % (ref 11.5–15.5)
WBC Count: 15.3 10*3/uL — ABNORMAL HIGH (ref 4.0–10.5)
nRBC: 0 % (ref 0.0–0.2)

## 2023-07-03 MED ORDER — ACETAMINOPHEN 325 MG PO TABS
650.0000 mg | ORAL_TABLET | Freq: Once | ORAL | Status: AC
  Administered 2023-07-03: 650 mg via ORAL
  Filled 2023-07-03: qty 2

## 2023-07-03 MED ORDER — DARATUMUMAB-HYALURONIDASE-FIHJ 1800-30000 MG-UT/15ML ~~LOC~~ SOLN
1800.0000 mg | Freq: Once | SUBCUTANEOUS | Status: AC
Start: 2023-07-03 — End: 2023-07-03
  Administered 2023-07-03: 1800 mg via SUBCUTANEOUS
  Filled 2023-07-03: qty 15

## 2023-07-03 MED ORDER — SODIUM CHLORIDE 0.9 % IV SOLN: Freq: Once | INTRAVENOUS | Status: AC

## 2023-07-03 MED ORDER — DIPHENHYDRAMINE HCL 25 MG PO CAPS
50.0000 mg | ORAL_CAPSULE | Freq: Once | ORAL | Status: AC
  Administered 2023-07-03: 50 mg via ORAL
  Filled 2023-07-03: qty 2

## 2023-07-03 MED ORDER — ZOLEDRONIC ACID 4 MG/100ML IV SOLN
4.0000 mg | Freq: Once | INTRAVENOUS | Status: AC
  Administered 2023-07-03: 4 mg via INTRAVENOUS
  Filled 2023-07-03: qty 100

## 2023-07-03 NOTE — Patient Instructions (Signed)

## 2023-07-03 NOTE — Progress Notes (Signed)
Patient states he took Dexamethasone at 0930 today at home

## 2023-07-03 NOTE — Progress Notes (Signed)
[facility] [phone]   [phone]  OFFICE PROGRESS NOTE  Farris Has, MD 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 Kathrynn Running.  CURRENT THERAPY:  1) Systemic chemotherapy with daratumumab subcutaneously weekly in addition to Velcade 1.3 Mg/M2 on days 1, 4, 8, 11 every 3 weeks as well as Revlimid 25 mg p.o. daily for 14 days every 3 weeks and Decadron 20 mg p.o. weekly with chemotherapy.  First cycle expected on 12/05/2022.  Status post 8 cycles.  Velcade and Revlimid were discontinued secondary to significant peripheral neuropathy and pancytopenia.  He is currently on maintenance treatment with daratumumab every 4 weeks in addition to Decadron 20 mg p.o. on weekly basis. 2) Zometa 4 mg IV infusion every 4 weeks.  INTERVAL HISTORY: Ronald Duran 82 y.o. male returns to the clinic today for follow-up visit accompanied by his wife. Discussed the use of AI scribe software for clinical note transcription with the patient, who gave verbal consent to proceed.  History of Present Illness   The patient, an 82 year old individual with a history of myeloma, presents for a follow-up visit. He reports no recent treatment, with the last round of daratumumab administered four weeks prior. The patient's treatment regimen has been adjusted due to significant neuropathy, leading to the discontinuation of Velcade and Revlimid. He is currently on a regimen of daratumumab every three weeks and dexamethasone weekly.  The patient reports persistent neuropathy but denies any significant pain that could be attributed to the myeloma. He has previously received radiation  treatment for disease in the left hip and a nodule in the lung. The patient occasionally experiences discomfort in the hip, particularly upon rising from a seated position or bed, but describes it as temporary and not significant.  The patient also mentions a recent runny nose over the past couple of days. However, he is otherwise feeling well and reports no other new or concerning symptoms.        MEDICAL HISTORY: Past Medical History:  Diagnosis Date   Atrial fibrillation (HCC)    CAD (coronary artery disease)    a. s/p MI with LAD stent placement December, 1999 -last stress test November 2008 with no EKG evidence of ischemia  b. cath 07/23/2015 after abnormal ETT and nuc, occluded mid LAD stent with good distal collateral, medical therapy    Cardiomyopathy (HCC)    Ischemic cardiomyopathy with EF 40% by echo 2005   COVID 2021   mild case   Dyslipidemia    Erectile dysfunction    GERD (gastroesophageal reflux disease)    Hiatal hernia    with esophageal strictures and dilatations in the past - Dr. [name]   History of nuclear stress test    Myoview 12/16: EF 47%, anterior, anteroseptal and inferoseptal defect suggesting infarct with peri-infarct ischemia, intermediate risk   HTN (hypertension)    Hx of seasonal allergies    Hyperlipidemia    Myocardial infarction (HCC) 08/01/1998   PAD (peripheral artery disease) (HCC)    Pre-diabetes    Prostate cancer (HCC)    with seed implant therapy following Lupron therapy in 2008 and 2009 - followed by Dr. Isabel Caprice  Ringworm    Stroke (HCC) 04/20/2017   seen on a MRI   Vitamin D deficiency     ALLERGIES:  has No Known Allergies.  MEDICATIONS:  Current Outpatient Medications  Medication Sig Dispense Refill   acyclovir (ZOVIRAX) 200 MG capsule Take 1 capsule (200 mg total) by mouth 2 (two) times daily. 60 capsule 5   atorvastatin (LIPITOR) 40 MG tablet Take 40 mg by mouth at bedtime.     B Complex Vitamins (B COMPLEX 100) tablet  Take 1 tablet by mouth daily. Balance     cetirizine (ZYRTEC ALLERGY) 10 MG tablet Take 1 tablet (10 mg total) by mouth daily. 30 tablet 0   Cholecalciferol (VITAMIN D3) 2000 UNITS TABS Take 2,000 Units by mouth daily.     Coenzyme Q10 (CO Q-10) 100 MG CAPS Take 100 mg by mouth daily with supper.     dexamethasone (DECADRON) 4 MG tablet 5 tab weekly start with the first dose of chemotherapy. (Patient taking differently: Take 20 mg by mouth See admin instructions. Take 5 tablets by mouth weekly start with the first dose of chemotherapy. Patient states he did take today because he thought maybe he would have Chemo today per patient) 40 tablet 3   ketoconazole (NIZORAL) 2 % cream Apply 1 Application topically daily as needed for irritation.     loperamide (IMODIUM) 2 MG capsule Take 1 capsule (2 mg total) by mouth every 6 (six) hours as needed for diarrhea or loose stools. 30 capsule 0   metoprolol tartrate (LOPRESSOR) 25 MG tablet Take 0.5 tablets (12.5 mg total) by mouth 2 (two) times daily. 90 tablet 3   Multiple Vitamins-Minerals (CENTRUM) tablet Take 1 tablet by mouth daily.     nitroGLYCERIN (NITROSTAT) 0.4 MG SL tablet PLACE 1 TABLET UNDER THE TONGUE IF NEEDED EVERY 5 MINUTES FOR CHEST PAIN FOR 3 DOSES IF NO RELIEF AFTER FIRST DOSE CALL 911. (Patient taking differently: Place 0.4 mg under the tongue every 5 (five) minutes as needed for chest pain.) 25 tablet 9   omeprazole (PRILOSEC) 20 MG capsule Take 20 mg by mouth daily before breakfast.     prochlorperazine (COMPAZINE) 10 MG tablet Take 1 tablet (10 mg total) by mouth every 6 (six) hours as needed for nausea or vomiting. 30 tablet 1   traMADol (ULTRAM) 50 MG tablet Take 1 tablet (50 mg total) by mouth every 12 (twelve) hours as needed. (Patient not taking: Reported on 06/16/2023) 30 tablet 0   triamcinolone ointment (KENALOG) 0.5 % Apply 1 Application topically 2 (two) times daily. (Patient taking differently: Apply 1 Application topically daily  as needed (For left foot rash).) 30 g 0   warfarin (COUMADIN) 5 MG tablet TAKE 1/2 TO 1 TABLET BY MOUTH AS DIRECTED by coumadin clinic 30 tablet 1   No current facility-administered medications for this visit.    SURGICAL HISTORY:  Past Surgical History:  Procedure Laterality Date   BRONCHIAL BIOPSY  11/15/2022   Procedure: BRONCHIAL BIOPSIES;  Surgeon: [name], DO;  Location: [facility];  Service: Pulmonary;;   BRONCHIAL BRUSHINGS  11/15/2022   Procedure: BRONCHIAL BRUSHINGS;  Surgeon: [name], DO;  Location: [facility];  Service: Pulmonary;;   BRONCHIAL NEEDLE ASPIRATION BIOPSY  11/15/2022   Procedure: BRONCHIAL NEEDLE ASPIRATION BIOPSIES;  Surgeon: [name], DO;  Location: [facility];  Service: Pulmonary;;   CARDIAC CATHETERIZATION N/A 07/23/2015   Procedure: Left Heart Cath and Coronary Angiography;  Surgeon: [name], MD;  Location: [facility] INVASIVE CV LAB;  Service: Cardiovascular;  Laterality: N/A;   CARDIOVERSION N/A 06/03/2022   Procedure: CARDIOVERSION;  Surgeon: [name], MD;  Location: [facility] ENDOSCOPY;  Service: Cardiovascular;  Laterality: N/A;   COLONOSCOPY WITH PROPOFOL N/A 11/24/2015   Procedure: COLONOSCOPY WITH PROPOFOL;  Surgeon: [name], MD;  Location: [facility] ENDOSCOPY;  Service: Endoscopy;  Laterality: N/A;   esophageal strictures with dilatations in the past per Dr. [name]     last '98   FIDUCIAL MARKER PLACEMENT  11/15/2022   Procedure: FIDUCIAL MARKER PLACEMENT;  Surgeon: [name], DO;  Location: [facility];  Service: Pulmonary;;   HERNIA REPAIR Left    INTRAMEDULLARY (IM) NAIL INTERTROCHANTERIC Left 10/25/2022   Procedure: LEFT INTRAMEDULLARY (IM) NAIL INTERTROCHANTERIC;  Surgeon: [name], MD;  Location: [facility] OR;  Service: Orthopedics;  Laterality: Left;   IR BONE MARROW BIOPSY & ASPIRATION  11/17/2022   IR BONE TUMOR(S)RF ABLATION  10/11/2022   IR KYPHO THORACIC WITH BONE BIOPSY  10/11/2022   LAD stent placement - Dr.  [name]     left inguinal hernia repair - Dr. [name]     prostatic radioactive seed implantation - Dr. [name]     '09    REVIEW OF SYSTEMS:  Constitutional: positive for fatigue Eyes: negative Ears, nose, mouth, throat, and face: negative Respiratory: negative Cardiovascular: negative Gastrointestinal: negative Genitourinary:negative Integument/breast: negative Hematologic/lymphatic: negative Musculoskeletal:positive for arthralgias Neurological: positive for paresthesia Behavioral/Psych: negative Endocrine: negative Allergic/Immunologic: negative   PHYSICAL EXAMINATION: General appearance: alert, cooperative, fatigued, and no distress Head: Normocephalic, without obvious abnormality, atraumatic Neck: no adenopathy, no JVD, supple, symmetrical, trachea midline, and thyroid not enlarged, symmetric, no tenderness/mass/nodules Lymph nodes: Cervical, supraclavicular, and axillary nodes normal. Resp: clear to auscultation bilaterally Back: symmetric, no curvature. ROM normal. No CVA tenderness. Cardio: regular rate and rhythm, S1, S2 normal, no murmur, click, rub or gallop GI: soft, non-tender; bowel sounds normal; no masses,  no organomegaly Extremities: extremities normal, atraumatic, no cyanosis or edema Neurologic: Alert and oriented X 3, normal strength and tone. Normal symmetric reflexes. Normal coordination and gait  ECOG PERFORMANCE STATUS: 1 - Symptomatic but completely ambulatory  Blood pressure 107/75, pulse 65, temperature (!) 97.4 F (36.3 C), temperature source Temporal, resp. rate 16, height 6' (1.829 m), weight 166 lb 14.4 oz (75.7 kg), SpO2 100%.  LABORATORY DATA: Lab Results  Component Value Date   WBC 15.3 (H) 07/03/2023   HGB 14.7 07/03/2023   HCT 43.6 07/03/2023   MCV 106.1 (H) 07/03/2023   PLT 92 (L) 07/03/2023      Chemistry      Component Value Date/Time   NA 140 06/28/2023 1132   NA 141 08/25/2022 1440   K 3.7 06/28/2023 1132   CL  107 06/28/2023 1132   CO2 28 06/28/2023 1132   BUN 22 06/28/2023 1132   BUN 14 08/25/2022 1440   CREATININE 1.02 06/28/2023 1132   CREATININE 1.23 (H) 07/21/2015 0917      Component Value Date/Time   CALCIUM 9.0 06/28/2023 1132   ALKPHOS 61 06/28/2023 1132   AST 31 06/28/2023 1132   ALT 65 (H) 06/28/2023 1132   BILITOT 1.1 06/28/2023 1132       RADIOGRAPHIC STUDIES: No results found.  ASSESSMENT AND PLAN: This is a very pleasant 82 years old white male with multiple myeloma presented with multiple lytic lesions involving T10, left femoral neck as well as T4, T9 and T11 vertebral bodies. Also had  right lower lobe lung nodule that was suspicious for non-small cell lung cancer. 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 The biopsy from the left hip was negative for malignancy. His myeloma panel showed no concerning findings except for elevated IgG but the serum light chains were normal. The patient underwent left hip fixation for impending fracture. He also underwent palliative radiotherapy under the care of Dr. Kathrynn Running. His recent bone marrow biopsy and aspirate was consistent with plasma cell neoplasm with involvement of plasma cells 25% of the bone marrow. The patient is currently on treatment with daratumumab and dexamethasone started on 12/05/2022 status post 8 cycles.  Velcade and Revlimid were discontinued secondary to adverse effect and intolerance.  He had repeat myeloma panel that showed no concerning findings for progression and he continues to have good response to the treatment. For the suspicious right lower lobe lung cancer, he underwent SBRT under the care of Dr. Kathrynn Running.    Multiple Myeloma Ronald Duran, an 82 year old, is undergoing treatment for multiple myeloma. Initially started on daratumumab, Velcade, Revlimid, and dexamethasone in March 2023. Velcade  and Revlimid were discontinued due to significant neuropathy. Currently on daratumumab every three weeks and dexamethasone 20 mg weekly. Last dose of daratumumab was four weeks ago, with a missed dose last week. Myeloma panel remains stable, indicating effective treatment. Reports no significant myeloma-related pain; hip pain improved post-radiation. Discussed adjusting daratumumab to every four weeks for convenience. Informed consent obtained for the adjusted schedule, including potential risks and benefits. - Administer daratumumab today - Adjust daratumumab schedule to every four weeks - Continue dexamethasone 20 mg weekly - Schedule next visit in four weeks  Peripheral Neuropathy Persistent neuropathy from previous Velcade and Revlimid treatments, not worsening. - Monitor neuropathy symptoms - Consider supportive measures if symptoms worsen  Post-Radiation Pain Occasional, temporary left hip discomfort, likely related to previous radiation treatment, mostly when getting out of bed. - Monitor hip pain - Advise on activity modifications to manage discomfort  General Health Maintenance Runny nose for the past few days, likely due to weather changes. No other significant health maintenance issues discussed. - Monitor runny nose symptoms - Advise on general health maintenance and seasonal precautions  Follow-up - Schedule follow-up appointment in four weeks.   The patient was advised to call immediately if he has any other concerning symptoms in the interval. The patient voices understanding of current disease status and treatment options and is in agreement with the current care plan.  All questions were answered. The patient knows to call the clinic with any problems, questions or concerns. We can certainly see the patient much sooner if necessary.  The total time spent in the appointment was 30 minutes.  Disclaimer: This note was dictated with voice recognition software. Similar  sounding words can inadvertently be transcribed and may not be corrected upon review.

## 2023-07-03 NOTE — Progress Notes (Unsigned)
Per Dr. Arbutus Ped , it is ok to treat pt today with Daratumumab and plts =92k and ok for pt to get his teeth cleaned and to received DTaP vaccine.

## 2023-07-04 ENCOUNTER — Other Ambulatory Visit: Payer: Self-pay

## 2023-07-05 ENCOUNTER — Ambulatory Visit: Payer: Medicare Other

## 2023-07-05 ENCOUNTER — Ambulatory Visit: Payer: Medicare Other | Attending: Cardiology | Admitting: *Deleted

## 2023-07-05 DIAGNOSIS — I4819 Other persistent atrial fibrillation: Secondary | ICD-10-CM

## 2023-07-05 DIAGNOSIS — I6349 Cerebral infarction due to embolism of other cerebral artery: Secondary | ICD-10-CM | POA: Diagnosis not present

## 2023-07-05 DIAGNOSIS — Z79899 Other long term (current) drug therapy: Secondary | ICD-10-CM

## 2023-07-05 DIAGNOSIS — Z7901 Long term (current) use of anticoagulants: Secondary | ICD-10-CM

## 2023-07-05 LAB — POCT INR: INR: 2.5 (ref 2.0–3.0)

## 2023-07-05 NOTE — Patient Instructions (Signed)
Description   Continue taking warfarin 1/2 tablet daily EXCEPT 1 tablet on Sundays, Tuesdays and Thursdays. Continue eating broccoli every other day.  Recheck INR 5 weeks.  Anticoagulation [phone].

## 2023-07-06 ENCOUNTER — Other Ambulatory Visit: Payer: Self-pay

## 2023-07-12 ENCOUNTER — Other Ambulatory Visit: Payer: Self-pay

## 2023-07-12 ENCOUNTER — Telehealth: Payer: Self-pay | Admitting: Medical Oncology

## 2023-07-12 NOTE — Telephone Encounter (Signed)
Pt notified that he does not need myeloma panel this month.

## 2023-07-17 ENCOUNTER — Other Ambulatory Visit: Payer: Self-pay

## 2023-07-18 ENCOUNTER — Ambulatory Visit (INDEPENDENT_AMBULATORY_CARE_PROVIDER_SITE_OTHER): Payer: Medicare Other

## 2023-07-18 ENCOUNTER — Ambulatory Visit (INDEPENDENT_AMBULATORY_CARE_PROVIDER_SITE_OTHER): Payer: Medicare Other | Admitting: Podiatry

## 2023-07-18 DIAGNOSIS — L84 Corns and callosities: Secondary | ICD-10-CM

## 2023-07-18 DIAGNOSIS — Z7901 Long term (current) use of anticoagulants: Secondary | ICD-10-CM | POA: Diagnosis not present

## 2023-07-18 DIAGNOSIS — M21619 Bunion of unspecified foot: Secondary | ICD-10-CM

## 2023-07-18 DIAGNOSIS — B351 Tinea unguium: Secondary | ICD-10-CM

## 2023-07-18 DIAGNOSIS — M778 Other enthesopathies, not elsewhere classified: Secondary | ICD-10-CM

## 2023-07-18 DIAGNOSIS — M2041 Other hammer toe(s) (acquired), right foot: Secondary | ICD-10-CM

## 2023-07-18 MED ORDER — CICLOPIROX 8 % EX SOLN: Freq: Every day | CUTANEOUS | 2 refills | Status: AC

## 2023-07-18 NOTE — Progress Notes (Signed)
Subjective:   Patient ID: Ronald Duran, male   DOB: 82 y.o.   MRN: [id number]   HPI Chief Complaint  Patient presents with   Callouses    RM#12 Calluses and corn on right foot referred by primary care provider.Bunion right foot also.   82 year old male presents the office today with above concerns.  Ongoing for the last several months however recently has been getting worse over last couple weeks.  No open lesions or any drainage.  He also has a bunion.  He has been on ketoconazole for nail fungus.  He states has not helped so he stopped using this a couple months ago.  No open lesion  Review of Systems  All other systems reviewed and are negative.       Objective:  Physical Exam  General: AAO x3, NAD  Dermatological: Hyperkeratotic lesion to the right foot submetatarsal 1 and 5.  Under the submetatarsal 5 lesion there are some dried blood and is preulcerative there is no skin breakdown noted today.  Nails have yellow discoloration.  No edema, erythema.  No lesions.  Dry skin present.  Vascular: Dorsalis Pedis artery and Posterior Tibial artery pedal pulses are palpable bilateral with immedate capillary fill time. Chonic lower extremity edema   Neruologic: Grossly intact via light touch bilateral.   Musculoskeletal: Bunion deformity noted with second toe overlapping the hallux.     Assessment:   82 year old male with preulcerative calluses, bunion/hammertoe deformity, onychauxis     Plan:  -Treatment options discussed including all alternatives, risks, and complications -Etiology of symptoms were discussed -X-rays were obtained and reviewed with the patient.  3 views of the foot were obtained.  No subacute fracture.  Bunion is present hammertoe deformity which is second toe is overlapping the hallux.  Hyperkeratotic, preulcerative callus on warfarin -Sharply debride lesion without any complications or bleeding.  Discussed moisturizer, offloading.  Discussed shoe  modifications.  Onychomycosis -Discussed treatment options.  Prescribed Penlac.  Bunion/hammertoe -Discussed with conservative as well surgical options.  Continue conservative treatment.  Discussed shoe modifications, soft offered extra-depth shoe.  Offloading.  Vivi Barrack DPM

## 2023-07-19 ENCOUNTER — Other Ambulatory Visit: Payer: Self-pay

## 2023-07-25 ENCOUNTER — Other Ambulatory Visit: Payer: Self-pay | Admitting: Internal Medicine

## 2023-07-25 DIAGNOSIS — I4819 Other persistent atrial fibrillation: Secondary | ICD-10-CM

## 2023-07-25 NOTE — Telephone Encounter (Signed)
Refill request for warfarin:  Last INR was 2.5 on 07/05/23 Next INR due 08/10/23 LOV was 06/16/23  Refill approved.

## 2023-07-28 ENCOUNTER — Encounter: Payer: Self-pay | Admitting: Internal Medicine

## 2023-07-28 NOTE — Progress Notes (Unsigned)
[name] OFFICE PROGRESS NOTE  Ronald Has, MD [address] [place] [address]  DIAGNOSIS:  1) Multiple myeloma, presented with plasmacytoma as well as other lytic lesions in the bone diagnosed in March 2024 2) right lower lobe pulmonary nodule suspicious for non-small cell lung cancer  PRIOR THERAPY: SBRT to the right lower lobe pulmonary nodule under the care of Dr. Kathrynn Duran.   CURRENT THERAPY: 1) Systemic chemotherapy with daratumumab subcutaneously weekly in addition to Velcade 1.3 Mg/M2 on days 1, 4, 8, 11 every 3 weeks as well as Revlimid 25 mg p.o. daily for 14 days every 3 weeks and Decadron 20 mg p.o. weekly with chemotherapy.  First cycle expected on 12/05/2022.  Status post 7 cycles. Revlimid Duran been on hold since June/May 2024. Velcade being held or cycle #7 due to PN and diarrhea.  2) Zometa monthly, first dose on 07/03/23  INTERVAL HISTORY: Ronald Duran 82 y.o. male returns to the clinic today for a follow-up visit accompanied by his wife.  The patient was last seen in the clinic by Dr. Arbutus Duran on 07/03/2023. The patient is currently on treatment with Darzalex and Decadron.  Velcade and Revlimid were discontinued due to side effects, cytopenias, and peripheral neuropathy.  The patient is feeling fairly well today.  The patient's tends to be borderline hypotensive.  He is on metoprolol and his cardiologist gave him instructions to hold his metoprolol if his systolic blood pressure is less than 90.  He took his metoprolol this morning because he checks his blood pressure every day.  He states that he tries drinking water but he could likely drink more water.  He Duran been having some bilateral lower extremity pitting edema the left is greater than the right.  He had some prior hip surgery.  Was wearing compression stockings but because they are difficult to get on he started wearing diabetic socks instead.  The patient does have congestive  heart failure and his EF is around 35%.   His other unusual symptom is related to an unusual rash on his foot that comes and goes.  This rash occurs bilaterally.  He did show me a similar rash at the appointment with me in June 2024.  He is no longer on Velcade or Revlimid so it is unlikely that the rash is related to these agents.  He saw his podiatrist recently but they mostly focused on the callus on his foot.  The rash is a well demarcated erythematous line that blanches the patient denies any associated itching.  The rash waxes and wanes without any intervention.  He does have an old prescription for ketoconazole cream for fungal infection on his foot. There is heat or swelling associated with the rash. He does have a dermatologist.   Otherwise he is feeling fairly well today.  He denies any fever, chills, or night sweats.  He denies any breathing changes, nausea, vomiting, headaches, vision changes, signs of infection, or abnormal bleeding or bruising.  He Duran some peripheral neuropathy that he describes as tingling.  He is concerned about starting medications for neuropathy due to side effect profile such as increased dizziness and swelling which he already struggles with swelling and low blood pressure.  He is here today for evaluation and repeat blood work.    MEDICAL HISTORY: Past Medical History:  Diagnosis Date   Atrial fibrillation (HCC)    CAD (coronary artery disease)    a. s/p MI with LAD stent  placement December, 1999 -last stress test November 2008 with no EKG evidence of ischemia  b. cath 07/23/2015 after abnormal ETT and nuc, occluded mid LAD stent with good distal collateral, medical therapy    Cardiomyopathy (HCC)    Ischemic cardiomyopathy with EF 40% by echo 2005   COVID 2021   mild case   Dyslipidemia    Erectile dysfunction    GERD (gastroesophageal reflux disease)    Hiatal hernia    with esophageal strictures and dilatations in the past - Ronald Duran   History  of nuclear stress test    Myoview 12/16: EF 47%, anterior, anteroseptal and inferoseptal defect suggesting infarct with peri-infarct ischemia, intermediate risk   HTN (hypertension)    Hx of seasonal allergies    Hyperlipidemia    Myocardial infarction (HCC) 08/01/1998   PAD (peripheral artery disease) (HCC)    Pre-diabetes    Prostate cancer (HCC)    with seed implant therapy following Lupron therapy in 2008 and 2009 - followed by Dr. Isabel Duran   Ringworm    Stroke (HCC) 04/20/2017   seen on a MRI   Vitamin D deficiency     ALLERGIES:  Duran no known allergies.  MEDICATIONS:  Current Outpatient Medications  Medication Sig Dispense Refill   acyclovir (ZOVIRAX) 200 MG capsule Take 1 capsule (200 mg total) by mouth 2 (two) times daily. 60 capsule 5   atorvastatin (LIPITOR) 40 MG tablet Take 40 mg by mouth at bedtime.     B Complex Vitamins (B COMPLEX 100) tablet Take 1 tablet by mouth daily. Balance     cetirizine (ZYRTEC ALLERGY) 10 MG tablet Take 1 tablet (10 mg total) by mouth daily. 30 tablet 0   Cholecalciferol (VITAMIN D3) 2000 UNITS TABS Take 2,000 Units by mouth daily.     ciclopirox (PENLAC) 8 % solution Apply topically at bedtime. Apply over nail and surrounding skin. Apply daily over previous coat. After seven (7) days, may remove with alcohol and continue cycle. 6.6 mL 2   Coenzyme Q10 (CO Q-10) 100 MG CAPS Take 100 mg by mouth daily with supper.     dexamethasone (DECADRON) 4 MG tablet 5 tab weekly start with the first dose of chemotherapy. (Patient taking differently: Take 20 mg by mouth See admin instructions. Take 5 tablets by mouth weekly start with the first dose of chemotherapy. Patient states he did take today because he thought maybe he would have Chemo today per patient) 40 tablet 3   ketoconazole (NIZORAL) 2 % cream Apply 1 Application topically daily as needed for irritation.     loperamide (IMODIUM) 2 MG capsule Take 1 capsule (2 mg total) by mouth every 6 (six) hours  as needed for diarrhea or loose stools. 30 capsule 0   metoprolol tartrate (LOPRESSOR) 25 MG tablet Take 0.5 tablets (12.5 mg total) by mouth 2 (two) times daily. 90 tablet 3   Multiple Vitamins-Minerals (CENTRUM) tablet Take 1 tablet by mouth daily.     nitroGLYCERIN (NITROSTAT) 0.4 MG SL tablet PLACE 1 TABLET UNDER THE TONGUE IF NEEDED EVERY 5 MINUTES FOR CHEST PAIN FOR 3 DOSES IF NO RELIEF AFTER FIRST DOSE CALL 911. (Patient taking differently: Place 0.4 mg under the tongue every 5 (five) minutes as needed for chest pain.) 25 tablet 9   omeprazole (PRILOSEC) 20 MG capsule Take 20 mg by mouth daily before breakfast.     prochlorperazine (COMPAZINE) 10 MG tablet Take 1 tablet (10 mg total) by mouth every 6 (six)  hours as needed for nausea or vomiting. 30 tablet 1   traMADol (ULTRAM) 50 MG tablet Take 1 tablet (50 mg total) by mouth every 12 (twelve) hours as needed. (Patient not taking: Reported on 06/16/2023) 30 tablet 0   triamcinolone ointment (KENALOG) 0.5 % Apply 1 Application topically 2 (two) times daily. (Patient taking differently: Apply 1 Application topically daily as needed (For left foot rash).) 30 g 0   warfarin (COUMADIN) 5 MG tablet TAKE 1/2 TO 1 TABLET BY MOUTH AS DIRECTED by coumadin clinic 30 tablet 5   No current facility-administered medications for this visit.   Facility-Administered Medications Ordered in Other Visits  Medication Dose Route Frequency Provider Last Rate Last Admin   0.9 %  sodium chloride infusion   Intravenous Continuous Si Gaul, MD 10 mL/hr at 07/31/23 1610 New Bag at 07/31/23 1610    SURGICAL HISTORY:  Past Surgical History:  Procedure Laterality Date   BRONCHIAL BIOPSY  11/15/2022   Procedure: BRONCHIAL BIOPSIES;  Surgeon: [name], DO;  Location: [facility] ENDOSCOPY;  Service: Pulmonary;;   BRONCHIAL BRUSHINGS  11/15/2022   Procedure: BRONCHIAL BRUSHINGS;  Surgeon: [name], DO;  Location: [facility];  Service: Pulmonary;;    BRONCHIAL NEEDLE ASPIRATION BIOPSY  11/15/2022   Procedure: BRONCHIAL NEEDLE ASPIRATION BIOPSIES;  Surgeon: [name], DO;  Location: [facility]OS[facility];  Service: Pulmonary;;   CARDIAC CATHETERIZATION N/A 07/23/2015   Procedure: Left Heart Cath and Coronary Angiography;  Surgeon: [name], MD;  Location: [facility] INVASIVE CV LAB;  Service: Cardiovascular;  Laterality: N/A;   CARDIOVERSION N/A 06/03/2022   Procedure: CARDIOVERSION;  Surgeon: [name], MD;  Location: [facility] ENDOSCOPY;  Service: Cardiovascular;  Laterality: N/A;   COLONOSCOPY WITH PROPOFOL N/A 11/24/2015   Procedure: COLONOSCOPY WITH PROPOFOL;  Surgeon: [name], MD;  Location: [facility] ENDOSCOPY;  Service: Endoscopy;  Laterality: N/A;   esophageal strictures with dilatations in the past per Ronald Duran     last '98   FIDUCIAL MARKER PLACEMENT  11/15/2022   Procedure: FIDUCIAL MARKER PLACEMENT;  Surgeon: [name], DO;  Location: [facility] ENDOSCOPY;  Service: Pulmonary;;   HERNIA REPAIR Left    INTRAMEDULLARY (IM) NAIL INTERTROCHANTERIC Left 10/25/2022   Procedure: LEFT INTRAMEDULLARY (IM) NAIL INTERTROCHANTERIC;  Surgeon: [name], MD;  Location: [facility] OR;  Service: Orthopedics;  Laterality: Left;   IR BONE MARROW BIOPSY & ASPIRATION  11/17/2022   IR BONE TUMOR(S)RF ABLATION  10/11/2022   IR KYPHO THORACIC WITH BONE BIOPSY  10/11/2022   LAD stent placement - Dr. [name]     left inguinal hernia repair - Dr. [name]     prostatic radioactive seed implantation - Dr. [name]     '09    REVIEW OF SYSTEMS:   Review of Systems  Constitutional: Positive for stable fatigue. Negative for appetite change, chills,  fever and unexpected weight change.  HENT: Negative for mouth sores, nosebleeds, sore throat and trouble swallowing.   Eyes: Negative for eye problems and icterus.  Respiratory: Negative for cough, hemoptysis, shortness of breath and wheezing.   Cardiovascular: Negative for chest pain. Positive for bilateral  lower extremity pitting edema (L>R).  Gastrointestinal: Negative for abdominal pain, constipation, diarrhea, nausea and vomiting.  Genitourinary: Negative for bladder incontinence, difficulty urinating, dysuria, frequency and hematuria.   Musculoskeletal: Positive for occasional chronic back/hip pain at night. Negative for gait problem, neck pain and neck stiffness.  Skin: Positive for rashes that come and go on  feet bilaterally.  Neurological: Negative for dizziness, extremity weakness, gait problem, headaches, light-headedness and seizures.  Hematological: Negative for adenopathy. Does not bruise/bleed easily.  Psychiatric/Behavioral: Negative for confusion, depression and sleep disturbance. The patient is not nervous/anxious.     PHYSICAL EXAMINATION:  Blood pressure 92/60, pulse 99, temperature 97.6 F (36.4 C), temperature source Temporal, resp. rate 15, weight 171 lb 4.8 oz (77.7 kg), SpO2 99%.  ECOG PERFORMANCE STATUS: 1-2  Physical Exam  Constitutional: Oriented to person, place, and time and thin appearing male, and in no distress.  HENT:  Head: Normocephalic and atraumatic.  Mouth/Throat: Oropharynx is clear and moist. No oropharyngeal exudate.  Eyes: Conjunctivae are normal. Right eye exhibits no discharge. Left eye exhibits no discharge. No scleral icterus.  Neck: Normal range of motion. Neck supple.  Cardiovascular: Normal rate, regular rhythm, normal heart sounds and intact distal pulses.   Pulmonary/Chest: Effort normal and breath sounds normal. No respiratory distress. No wheezes. No rales.  Abdominal: Soft. Bowel sounds are normal. Exhibits no distension and no mass. There is no tenderness.  Musculoskeletal: Normal range of motion. Stable ankle swelling/pitting edema (L>R) (had prior left hip surgery).  Lymphadenopathy:    No cervical adenopathy.  Neurological: Positive for tremor. Alert and oriented to person, place, and time. Exhibits normal muscle tone. Gait normal.  Coordination normal.  Skin: Positive for bruising. Well demarcated rash that blanches with some dry appearing skin. No localized swelling, pain, or heat. Skin is warm and dry.  Not diaphoretic.  No pallor. Psychiatric: Mood, memory and judgment normal.  Vitals reviewed.   Psychiatric: Mood, memory and judgment normal.  Vitals reviewed.  LABORATORY DATA: Lab Results  Component Value Date   WBC 12.0 (H) 07/31/2023   HGB 14.3 07/31/2023   HCT 41.6 07/31/2023   MCV 105.1 (H) 07/31/2023   PLT 103 (L) 07/31/2023      Chemistry      Component Value Date/Time   NA 137 07/31/2023 1439   NA 141 08/25/2022 1440   K 4.3 07/31/2023 1439   CL 103 07/31/2023 1439   CO2 28 07/31/2023 1439   BUN 16 07/31/2023 1439   BUN 14 08/25/2022 1440   CREATININE 0.91 07/31/2023 1439   CREATININE 1.23 (H) 07/21/2015 0917      Component Value Date/Time   CALCIUM 9.3 07/31/2023 1439   ALKPHOS 74 07/31/2023 1439   AST 20 07/31/2023 1439   ALT 34 07/31/2023 1439   BILITOT 1.7 (H) 07/31/2023 1439       RADIOGRAPHIC STUDIES:  DG Foot Complete Right Result Date: 07/18/2023 Please see detailed radiograph report in office note.    ASSESSMENT/PLAN:  This is a very pleasant 82 year old Caucasian male with multiple myeloma presented with multiple lytic lesions involving T10, left femoral neck as well as T4, T9 and T11 vertebral bodies.    He also had right lower lobe lung nodule that was suspicious for non-small cell lung cancer.    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    he biopsy from the left hip was negative for malignancy. His myeloma panel showed no concerning findings except for elevated IgG but the serum light chains were normal. The patient underwent left hip fixation for impending fracture. He also underwent palliative radiotherapy under the care of Dr. Kathrynn Duran. His  recent bone marrow biopsy and aspirate was consistent with plasma cell neoplasm with involvement  of plasma cells 25% of the bone marrow.  The patient is currently on treatment with daratumumab, Velcade, Revlimid and dexamethasone started on 12/05/2022 status post 5 cycle.  The patient Duran had treatment held periodically due to thrombocytopenia.  His dose of Revlimid was reduced to 15 milligrams due to thrombocytopenia.  However, his Revlimid was ultimately held starting from May/June 2024.    Velcade was discontinued as the patient was recovering from diarrhea, hypotension, and dizziness.  He also was developing peripheral neuropathy.  Labs were reviewed.  His labs today show stable but mildly elevated bilirubin at 1.7. Recommend he continue with dara every 4 weeks as scheduled today.   Will continue to receive Zometa monthly.  We will see him back for a follow-up visit in 4 weeks before his next cycle of treatment.  The patient Duran bilateral lower extremity swelling left greater than right as he had a prior left hip surgery.  His edema is pitting.  I do not think that the patient is a good candidate for Lasix given his hypotension.  The patient Duran CHF.  We discussed wearing compression stockings versus the diabetic stocks.  The patient states he had a hard time getting compression stockings on.  I reviewed a video with the patient giving him tips on how to get his compression stockings on more effectively.  He also was advised to elevate his lower extremities, increase his protein intake, and avoid salty foods.  Regarding his blood pressure, the patient Duran detailed instructions outlined by his cardiologist went to hold his BP medications.  He will likely need to hold his BP dose tonight.  He was advised to hydrate well at home and monitor his blood pressure closely at home.  The patient Duran an unusual rash on his lower extremities. He had this when I saw him back in June although this was not as  significant today.  This rash waxes and wanes.  The patient states it is not pruritic although it did look dry.  It may be fungal in nature and I did instruct him to try and use his ketoconazole cream.  I instructed him to follow-up with his dermatologist.   We discussed medications for peripheral neuropathy.  Concerned about the side effect profile with the patient as he already Duran lower extremity swelling and struggles with hypotension although he denied any dizziness today.  After reviewing side effects of gabapentin he would like to hold off on any gabapentin at this time.  The patient was advised to call immediately if she Duran any concerning symptoms in the interval. The patient voices understanding of current disease status and treatment options and is in agreement with the current care plan. All questions were answered. The patient knows to call the clinic with any problems, questions or concerns. We can certainly see the patient much sooner if necessary       No orders of the defined types were placed in this encounter.    The total time spent in the appointment was 30-39 minutes  Breydon Senters L Carroll Lingelbach, PA-C 07/31/23

## 2023-07-31 ENCOUNTER — Inpatient Hospital Stay (HOSPITAL_BASED_OUTPATIENT_CLINIC_OR_DEPARTMENT_OTHER): Payer: Medicare Other | Admitting: Physician Assistant

## 2023-07-31 ENCOUNTER — Encounter: Payer: Self-pay | Admitting: Internal Medicine

## 2023-07-31 ENCOUNTER — Inpatient Hospital Stay: Payer: Medicare Other | Attending: Internal Medicine

## 2023-07-31 ENCOUNTER — Inpatient Hospital Stay: Payer: Medicare Other

## 2023-07-31 VITALS — BP 92/60 | HR 99 | Temp 97.6°F | Resp 15 | Wt 171.3 lb

## 2023-07-31 VITALS — BP 107/73 | HR 72 | Resp 16

## 2023-07-31 DIAGNOSIS — C9000 Multiple myeloma not having achieved remission: Secondary | ICD-10-CM | POA: Insufficient documentation

## 2023-07-31 DIAGNOSIS — Z5112 Encounter for antineoplastic immunotherapy: Secondary | ICD-10-CM | POA: Diagnosis present

## 2023-07-31 DIAGNOSIS — R609 Edema, unspecified: Secondary | ICD-10-CM | POA: Diagnosis not present

## 2023-07-31 DIAGNOSIS — Z79620 Long term (current) use of immunosuppressive biologic: Secondary | ICD-10-CM | POA: Insufficient documentation

## 2023-07-31 LAB — CBC WITH DIFFERENTIAL (CANCER CENTER ONLY)
Abs Immature Granulocytes: 0.05 10*3/uL (ref 0.00–0.07)
Basophils Absolute: 0 10*3/uL (ref 0.0–0.1)
Basophils Relative: 0 %
Eosinophils Absolute: 0 10*3/uL (ref 0.0–0.5)
Eosinophils Relative: 0 %
HCT: 41.6 % (ref 39.0–52.0)
Hemoglobin: 14.3 g/dL (ref 13.0–17.0)
Immature Granulocytes: 0 %
Lymphocytes Relative: 1 %
Lymphs Abs: 0.1 10*3/uL — ABNORMAL LOW (ref 0.7–4.0)
MCH: 36.1 pg — ABNORMAL HIGH (ref 26.0–34.0)
MCHC: 34.4 g/dL (ref 30.0–36.0)
MCV: 105.1 fL — ABNORMAL HIGH (ref 80.0–100.0)
Monocytes Absolute: 0.3 10*3/uL (ref 0.1–1.0)
Monocytes Relative: 2 %
Neutro Abs: 11.5 10*3/uL — ABNORMAL HIGH (ref 1.7–7.7)
Neutrophils Relative %: 97 %
Platelet Count: 103 10*3/uL — ABNORMAL LOW (ref 150–400)
RBC: 3.96 MIL/uL — ABNORMAL LOW (ref 4.22–5.81)
RDW: 13.5 % (ref 11.5–15.5)
WBC Count: 12 10*3/uL — ABNORMAL HIGH (ref 4.0–10.5)
nRBC: 0 % (ref 0.0–0.2)

## 2023-07-31 LAB — CMP (CANCER CENTER ONLY)
ALT: 34 U/L (ref 0–44)
AST: 20 U/L (ref 15–41)
Albumin: 3.6 g/dL (ref 3.5–5.0)
Alkaline Phosphatase: 74 U/L (ref 38–126)
Anion gap: 6 (ref 5–15)
BUN: 16 mg/dL (ref 8–23)
CO2: 28 mmol/L (ref 22–32)
Calcium: 9.3 mg/dL (ref 8.9–10.3)
Chloride: 103 mmol/L (ref 98–111)
Creatinine: 0.91 mg/dL (ref 0.61–1.24)
GFR, Estimated: >60 mL/min (ref >60)
Glucose, Bld: 121 mg/dL — ABNORMAL HIGH (ref 70–99)
Potassium: 4.3 mmol/L (ref 3.5–5.1)
Sodium: 137 mmol/L (ref 135–145)
Total Bilirubin: 1.7 mg/dL — ABNORMAL HIGH (ref <1.2)
Total Protein: 5.5 g/dL — ABNORMAL LOW (ref 6.5–8.1)

## 2023-07-31 MED ORDER — DIPHENHYDRAMINE HCL 25 MG PO CAPS
50.0000 mg | ORAL_CAPSULE | Freq: Once | ORAL | Status: AC
  Administered 2023-07-31: 50 mg via ORAL
  Filled 2023-07-31: qty 2

## 2023-07-31 MED ORDER — ACETAMINOPHEN 325 MG PO TABS
650.0000 mg | ORAL_TABLET | Freq: Once | ORAL | Status: AC
Start: 2023-07-31 — End: 2023-07-31
  Administered 2023-07-31: 650 mg via ORAL
  Filled 2023-07-31: qty 2

## 2023-07-31 MED ORDER — DARATUMUMAB-HYALURONIDASE-FIHJ 1800-30000 MG-UT/15ML ~~LOC~~ SOLN
1800.0000 mg | Freq: Once | SUBCUTANEOUS | Status: AC
Start: 2023-07-31 — End: 2023-07-31
  Administered 2023-07-31: 1800 mg via SUBCUTANEOUS
  Filled 2023-07-31: qty 15

## 2023-07-31 MED ORDER — ZOLEDRONIC ACID 4 MG/100ML IV SOLN
4.0000 mg | Freq: Once | INTRAVENOUS | Status: AC
Start: 2023-07-31 — End: 2023-07-31
  Administered 2023-07-31: 4 mg via INTRAVENOUS
  Filled 2023-07-31: qty 100

## 2023-07-31 MED ORDER — SODIUM CHLORIDE 0.9 % IV SOLN: INTRAVENOUS | Status: DC

## 2023-07-31 NOTE — Patient Instructions (Signed)

## 2023-07-31 NOTE — Progress Notes (Signed)
OK to treat w/ Tbili = 1.7 per Cassie Heilingoetter, PA.  Ebony Hail, Pharm.D., CPP 07/31/2023@4 :12 PM

## 2023-07-31 NOTE — Progress Notes (Signed)
Patient states he took Dexamethasone at home today at 1100.

## 2023-08-04 ENCOUNTER — Other Ambulatory Visit: Payer: Self-pay

## 2023-08-07 ENCOUNTER — Encounter: Payer: Self-pay | Admitting: Podiatry

## 2023-08-10 ENCOUNTER — Telehealth: Payer: Self-pay | Admitting: Podiatry

## 2023-08-10 ENCOUNTER — Ambulatory Visit: Payer: Medicare Other | Attending: Internal Medicine

## 2023-08-10 DIAGNOSIS — Z79899 Other long term (current) drug therapy: Secondary | ICD-10-CM

## 2023-08-10 DIAGNOSIS — I4819 Other persistent atrial fibrillation: Secondary | ICD-10-CM

## 2023-08-10 DIAGNOSIS — I6349 Cerebral infarction due to embolism of other cerebral artery: Secondary | ICD-10-CM

## 2023-08-10 LAB — POCT INR: INR: 2.5 (ref 2.0–3.0)

## 2023-08-10 NOTE — Patient Instructions (Signed)
 Description   Continue taking warfarin 1/2 tablet daily EXCEPT 1 tablet on Sundays, Tuesdays and Thursdays. Continue eating broccoli every other day.  Recheck INR 6 weeks.  Anticoagulation [phone].

## 2023-08-10 NOTE — Telephone Encounter (Signed)
 The pharmacy called and they do not make the cream you called in with the urea compound. What would you like them to do?

## 2023-08-11 NOTE — Telephone Encounter (Signed)
 Notified pharmacy it was sent to another pharmacy and they said thank you.  I also left message for pt to be expecting a call from [place] as the [place] [facility] could not compound medication for you and to call if he did not hear from them by middle of next week.

## 2023-08-28 ENCOUNTER — Inpatient Hospital Stay (HOSPITAL_BASED_OUTPATIENT_CLINIC_OR_DEPARTMENT_OTHER): Payer: Medicare Other | Admitting: Internal Medicine

## 2023-08-28 ENCOUNTER — Inpatient Hospital Stay: Payer: Medicare Other

## 2023-08-28 ENCOUNTER — Inpatient Hospital Stay: Payer: Medicare Other | Attending: Internal Medicine | Admitting: Internal Medicine

## 2023-08-28 VITALS — HR 93

## 2023-08-28 VITALS — BP 105/83 | Temp 97.0°F | Resp 17 | Ht 72.0 in | Wt 178.1 lb

## 2023-08-28 DIAGNOSIS — Z5112 Encounter for antineoplastic immunotherapy: Secondary | ICD-10-CM | POA: Insufficient documentation

## 2023-08-28 DIAGNOSIS — C9000 Multiple myeloma not having achieved remission: Secondary | ICD-10-CM

## 2023-08-28 LAB — CBC WITH DIFFERENTIAL (CANCER CENTER ONLY)
Abs Immature Granulocytes: 0.07 10*3/uL (ref 0.00–0.07)
Basophils Absolute: 0.1 10*3/uL (ref 0.0–0.1)
Basophils Relative: 0 %
Eosinophils Absolute: 0.4 10*3/uL (ref 0.0–0.5)
Eosinophils Relative: 3 %
HCT: 43.1 % (ref 39.0–52.0)
Hemoglobin: 14.3 g/dL (ref 13.0–17.0)
Immature Granulocytes: 1 %
Lymphocytes Relative: 1 %
Lymphs Abs: 0.2 10*3/uL — ABNORMAL LOW (ref 0.7–4.0)
MCH: 34.6 pg — ABNORMAL HIGH (ref 26.0–34.0)
MCHC: 33.2 g/dL (ref 30.0–36.0)
MCV: 104.4 fL — ABNORMAL HIGH (ref 80.0–100.0)
Monocytes Absolute: 0.5 10*3/uL (ref 0.1–1.0)
Monocytes Relative: 3 %
Neutro Abs: 13.6 10*3/uL — ABNORMAL HIGH (ref 1.7–7.7)
Neutrophils Relative %: 92 %
Platelet Count: 154 10*3/uL (ref 150–400)
RBC: 4.13 MIL/uL — ABNORMAL LOW (ref 4.22–5.81)
RDW: 13.9 % (ref 11.5–15.5)
WBC Count: 14.8 10*3/uL — ABNORMAL HIGH (ref 4.0–10.5)
nRBC: 0 % (ref 0.0–0.2)

## 2023-08-28 LAB — CMP (CANCER CENTER ONLY)
ALT: 33 U/L (ref 0–44)
AST: 21 U/L (ref 15–41)
Albumin: 3.4 g/dL — ABNORMAL LOW (ref 3.5–5.0)
Alkaline Phosphatase: 78 U/L (ref 38–126)
Anion gap: 7 (ref 5–15)
BUN: 16 mg/dL (ref 8–23)
CO2: 27 mmol/L (ref 22–32)
Calcium: 8.7 mg/dL — ABNORMAL LOW (ref 8.9–10.3)
Chloride: 104 mmol/L (ref 98–111)
Creatinine: 0.96 mg/dL (ref 0.61–1.24)
GFR, Estimated: >60 mL/min (ref >60)
Glucose, Bld: 106 mg/dL — ABNORMAL HIGH (ref 70–99)
Potassium: 4 mmol/L (ref 3.5–5.1)
Sodium: 138 mmol/L (ref 135–145)
Total Bilirubin: 1.6 mg/dL — ABNORMAL HIGH (ref 0.0–1.2)
Total Protein: 5.4 g/dL — ABNORMAL LOW (ref 6.5–8.1)

## 2023-08-28 MED ORDER — DARATUMUMAB-HYALURONIDASE-FIHJ 1800-30000 MG-UT/15ML ~~LOC~~ SOLN
1800.0000 mg | Freq: Once | SUBCUTANEOUS | Status: AC
Start: 2023-08-28 — End: 2023-08-28
  Administered 2023-08-28: 1800 mg via SUBCUTANEOUS
  Filled 2023-08-28: qty 15

## 2023-08-28 MED ORDER — SODIUM CHLORIDE 0.9 % IV SOLN: Freq: Once | INTRAVENOUS | Status: AC

## 2023-08-28 MED ORDER — ZOLEDRONIC ACID 4 MG/100ML IV SOLN
4.0000 mg | Freq: Once | INTRAVENOUS | Status: AC
  Administered 2023-08-28: 4 mg via INTRAVENOUS
  Filled 2023-08-28: qty 100

## 2023-08-28 MED ORDER — ACETAMINOPHEN 325 MG PO TABS
650.0000 mg | ORAL_TABLET | Freq: Once | ORAL | Status: AC
  Administered 2023-08-28: 650 mg via ORAL
  Filled 2023-08-28: qty 2

## 2023-08-28 MED ORDER — DIPHENHYDRAMINE HCL 25 MG PO CAPS
50.0000 mg | ORAL_CAPSULE | Freq: Once | ORAL | Status: AC
  Administered 2023-08-28: 50 mg via ORAL
  Filled 2023-08-28: qty 2

## 2023-08-28 NOTE — Progress Notes (Signed)
[facility] Telephone:([phone]) [phone]   Fax:([phone]  OFFICE PROGRESS NOTE  Farris Has, MD [address] [place] [address]  DIAGNOSIS:  1) Multiple myeloma, presented with plasmacytoma as well as other lytic lesions in the bone diagnosed in March 2024 2) right lower lobe pulmonary nodule suspicious for non-small cell lung cancer  PRIOR THERAPY: SBRT to the right lower lobe pulmonary nodule under the care of Dr. Kathrynn Running.  CURRENT THERAPY:  1) Systemic chemotherapy with daratumumab subcutaneously weekly in addition to Velcade 1.3 Mg/M2 on days 1, 4, 8, 11 every 3 weeks as well as Revlimid 25 mg p.o. daily for 14 days every 3 weeks and Decadron 20 mg p.o. weekly with chemotherapy.  First cycle expected on 12/05/2022.  Status post 8 cycles.  Velcade and Revlimid were discontinued secondary to significant peripheral neuropathy and pancytopenia.  He is currently on maintenance treatment with daratumumab every 4 weeks in addition to Decadron 20 mg p.o. on weekly basis. 2) Zometa 4 mg IV infusion every 4 weeks.  INTERVAL HISTORY: Ronald Duran 83 y.o. male returns to the clinic today for follow-up visit accompanied by his wife.Discussed the use of AI scribe software for clinical note transcription with the patient, who gave verbal consent to proceed.  History of Present Illness   Ronald Duran, an 83 year old patient with a history of multiple myeloma and atrial fibrillation, has been undergoing treatment since March 2024. Initially, the treatment regimen included daratumumab, Velcade, Revlimid, and Decadron. Currently, the patient is on daratumumab and Decadron, administered once every four weeks.  The patient reports feeling well overall, with no new or worsening symptoms. He has experienced some back pain, but it has not been severe or limiting. He has noticed an improvement in his mobility, now using a walker only for support. He reports that  his legs feel stronger, and he has been able to walk for twenty to thirty minutes a day.  The patient also has a history of a lung nodule, which was treated with SBRT by Dr. Kathrynn Running. There have been no reported complications or issues related to this treatment.  In addition to his myeloma treatment, the patient is also managing atrial fibrillation. He takes blood pressure readings every morning, as suggested by his cardiologist. The readings have been relatively stable, with a recent reading of ninety six over seventy six. The patient is on several medications for this condition, including Lopressor, Coumadin, and amlodipine.  The patient is scheduled to see his cardiologist in about a month. He has not reported any new or worsening symptoms related to his atrial fibrillation.        MEDICAL HISTORY: Past Medical History:  Diagnosis Date   Atrial fibrillation (HCC)    CAD (coronary artery disease)    a. s/p MI with LAD stent placement December, 1999 -last stress test November 2008 with no EKG evidence of ischemia  b. cath 07/23/2015 after abnormal ETT and nuc, occluded mid LAD stent with good distal collateral, medical therapy    Cardiomyopathy (HCC)    Ischemic cardiomyopathy with EF 40% by echo 2005   COVID 2021   mild case   Dyslipidemia    Erectile dysfunction    GERD (gastroesophageal reflux disease)    Hiatal hernia    with esophageal strictures and dilatations in the past - Dr. [name]   History of nuclear stress test    Myoview 12/16: EF 47%, anterior, anteroseptal and inferoseptal  defect suggesting infarct with peri-infarct ischemia, intermediate risk   HTN (hypertension)    Hx of seasonal allergies    Hyperlipidemia    Myocardial infarction (HCC) 08/01/1998   PAD (peripheral artery disease) (HCC)    Pre-diabetes    Prostate cancer (HCC)    with seed implant therapy following Lupron therapy in 2008 and 2009 - followed by Dr. Isabel Caprice   Ringworm    Stroke [facility])  04/20/2017   seen on a MRI   Vitamin D deficiency     ALLERGIES:  has no known allergies.  MEDICATIONS:  Current Outpatient Medications  Medication Sig Dispense Refill   acyclovir (ZOVIRAX) 200 MG capsule Take 1 capsule (200 mg total) by mouth 2 (two) times daily. 60 capsule 5   atorvastatin (LIPITOR) 40 MG tablet Take 40 mg by mouth at bedtime.     B Complex Vitamins (B COMPLEX 100) tablet Take 1 tablet by mouth daily. Balance     cetirizine (ZYRTEC ALLERGY) 10 MG tablet Take 1 tablet (10 mg total) by mouth daily. 30 tablet 0   Cholecalciferol (VITAMIN D3) 2000 UNITS TABS Take 2,000 Units by mouth daily.     ciclopirox (PENLAC) 8 % solution Apply topically at bedtime. Apply over nail and surrounding skin. Apply daily over previous coat. After seven (7) days, may remove with alcohol and continue cycle. 6.6 mL 2   Coenzyme Q10 (CO Q-10) 100 MG CAPS Take 100 mg by mouth daily with supper.     dexamethasone (DECADRON) 4 MG tablet 5 tab weekly start with the first dose of chemotherapy. (Patient taking differently: Take 20 mg by mouth See admin instructions. Take 5 tablets by mouth weekly start with the first dose of chemotherapy. Patient states he did take today because he thought maybe he would have Chemo today per patient) 40 tablet 3   ketoconazole (NIZORAL) 2 % cream Apply 1 Application topically daily as needed for irritation.     loperamide (IMODIUM) 2 MG capsule Take 1 capsule (2 mg total) by mouth every 6 (six) hours as needed for diarrhea or loose stools. 30 capsule 0   metoprolol tartrate (LOPRESSOR) 25 MG tablet Take 0.5 tablets (12.5 mg total) by mouth 2 (two) times daily. 90 tablet 3   Multiple Vitamins-Minerals (CENTRUM) tablet Take 1 tablet by mouth daily.     nitroGLYCERIN (NITROSTAT) 0.4 MG SL tablet PLACE 1 TABLET UNDER THE TONGUE IF NEEDED EVERY 5 MINUTES FOR CHEST PAIN FOR 3 DOSES IF NO RELIEF AFTER FIRST DOSE CALL 911. (Patient taking differently: Place 0.4 mg under the tongue  every 5 (five) minutes as needed for chest pain.) 25 tablet 9   omeprazole (PRILOSEC) 20 MG capsule Take 20 mg by mouth daily before breakfast.     prochlorperazine (COMPAZINE) 10 MG tablet Take 1 tablet (10 mg total) by mouth every 6 (six) hours as needed for nausea or vomiting. 30 tablet 1   traMADol (ULTRAM) 50 MG tablet Take 1 tablet (50 mg total) by mouth every 12 (twelve) hours as needed. (Patient not taking: Reported on 06/16/2023) 30 tablet 0   triamcinolone ointment (KENALOG) 0.5 % Apply 1 Application topically 2 (two) times daily. (Patient taking differently: Apply 1 Application topically daily as needed (For left foot rash).) 30 g 0   warfarin (COUMADIN) 5 MG tablet TAKE 1/2 TO 1 TABLET BY MOUTH AS DIRECTED by coumadin clinic 30 tablet 5   No current facility-administered medications for this visit.    SURGICAL HISTORY:  Past  Surgical History:  Procedure Laterality Date   BRONCHIAL BIOPSY  11/15/2022   Procedure: BRONCHIAL BIOPSIES;  Surgeon: [name], DO;  Location: [facility] END[facility];  Service: Pulmonary;;   BRONCHIAL BRUSHINGS  11/15/2022   Procedure: BRONCHIAL BRUSHINGS;  Surgeon: [name], DO;  Location: [facility];  Service: Pulmonary;;   BRONCHIAL NEEDLE ASPIRATION BIOPSY  11/15/2022   Procedure: BRONCHIAL NEEDLE ASPIRATION BIOPSIES;  Surgeon: [name], DO;  Location: [facility] ENDOSCOPY;  Service: Pulmonary;;   CARDIAC CATHETERIZATION N/A 07/23/2015   Procedure: Left Heart Cath and Coronary Angiography;  Surgeon: [name], MD;  Location: [facility] INVASIVE CV LAB;  Service: Cardiovascular;  Laterality: N/A;   CARDIOVERSION N/A 06/03/2022   Procedure: CARDIOVERSION;  Surgeon: [name], MD;  Location: [facility] ENDOSCOPY;  Service: Cardiovascular;  Laterality: N/A;   COLONOSCOPY WITH PROPOFOL N/A 11/24/2015   Procedure: COLONOSCOPY WITH PROPOFOL;  Surgeon: [name], MD;  Location: [facility] ENDOSCOPY;  Service: Endoscopy;  Laterality: N/A;   esophageal strictures with  dilatations in the past per Dr. [name]     last '98   FIDUCIAL MARKER PLACEMENT  11/15/2022   Procedure: FIDUCIAL MARKER PLACEMENT;  Surgeon: [name], DO;  Location: [facility] ENDOSCOPY;  Service: Pulmonary;;   HERNIA REPAIR Left    INTRAMEDULLARY (IM) NAIL INTERTROCHANTERIC Left 10/25/2022   Procedure: LEFT INTRAMEDULLARY (IM) NAIL INTERTROCHANTERIC;  Surgeon: [name], MD;  Location: [facility] OR;  Service: Orthopedics;  Laterality: Left;   IR BONE MARROW BIOPSY & ASPIRATION  11/17/2022   IR BONE TUMOR(S)RF ABLATION  10/11/2022   IR KYPHO THORACIC WITH BONE BIOPSY  10/11/2022   LAD stent placement - Dr. [name]     left inguinal hernia repair - Dr. [name]     prostatic radioactive seed implantation - Dr. [name]     '09    REVIEW OF SYSTEMS:  A comprehensive review of systems was negative except for: Constitutional: positive for fatigue Musculoskeletal: positive for arthralgias   PHYSICAL EXAMINATION: General appearance: alert, cooperative, fatigued, and no distress Head: Normocephalic, without obvious abnormality, atraumatic Neck: no adenopathy, no JVD, supple, symmetrical, trachea midline, and thyroid not enlarged, symmetric, no tenderness/mass/nodules Lymph nodes: Cervical, supraclavicular, and axillary nodes normal. Resp: clear to auscultation bilaterally Back: symmetric, no curvature. ROM normal. No CVA tenderness. Cardio: irregularly irregular rhythm GI: soft, non-tender; bowel sounds normal; no masses,  no organomegaly Extremities: extremities normal, atraumatic, no cyanosis or edema  ECOG PERFORMANCE STATUS: 1 - Symptomatic but completely ambulatory  Blood pressure 105/83, temperature (!) 97 F (36.1 C), temperature source Temporal, resp. rate 17, height 6' (1.829 m), weight 178 lb 1.6 oz (80.8 kg), SpO2 99%.  LABORATORY DATA: Lab Results  Component Value Date   WBC 14.8 (H) 08/28/2023   HGB 14.3 08/28/2023   HCT 43.1 08/28/2023   MCV 104.4 (H) 08/28/2023    PLT 154 08/28/2023      Chemistry      Component Value Date/Time   NA 137 07/31/2023 1439   NA 141 08/25/2022 1440   K 4.3 07/31/2023 1439   CL 103 07/31/2023 1439   CO2 28 07/31/2023 1439   BUN 16 07/31/2023 1439   BUN 14 08/25/2022 1440   CREATININE 0.91 07/31/2023 1439   CREATININE 1.23 (H) 07/21/2015 0917      Component Value Date/Time   CALCIUM 9.3 07/31/2023 1439   ALKPHOS 74 07/31/2023 1439   AST 20 07/31/2023 1439   ALT 34 07/31/2023 1439  BILITOT 1.7 (H) 07/31/2023 1439       RADIOGRAPHIC STUDIES: No results found.  ASSESSMENT AND PLAN: This is a very pleasant 83 years old white male with multiple myeloma presented with multiple lytic lesions involving T10, left femoral neck as well as T4, T9 and T11 vertebral bodies. Also had right lower lobe lung nodule that was suspicious for non-small cell lung cancer. 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 The biopsy from the left hip was negative for malignancy. His myeloma panel showed no concerning findings except for elevated IgG but the serum light chains were normal. The patient underwent left hip fixation for impending fracture. He also underwent palliative radiotherapy under the care of Dr. Kathrynn Running. His recent bone marrow biopsy and aspirate was consistent with plasma cell neoplasm with involvement of plasma cells 25% of the bone marrow. The patient is currently on treatment with daratumumab and dexamethasone started on 12/05/2022 status post 11 cycles.  Velcade and Revlimid were discontinued secondary to adverse effect and intolerance.   For the suspicious right lower lobe lung cancer, he underwent SBRT under the care of Dr. Kathrynn Running.    Multiple Myeloma 83 year old with multiple myeloma, currently on daratumumab and dexamethasone. Previously treated with daratumumab, Velcade, Revlimid, and  dexamethasone. Reports improved mobility, walking 20-30 minutes daily. Blood counts stable except for elevated white blood count due to dexamethasone. Platelet count normal at 154. - Continue daratumumab and dexamethasone - Order myeloma panel one week before the next visit  Atrial Fibrillation Atrial fibrillation managed with Lopressor, Coumadin, and amlodipine. Home blood pressure stable, recent reading 96/76 mmHg. In-office blood pressure 105/83 mmHg. Elevated heart rate likely due to recent physical activity. - Continue Lopressor, Coumadin, and amlodipine - Monitor blood pressure daily - Follow up with cardiologist in one month - Contact cardiologist if any changes in symptoms  General Health Maintenance Overall health well-managed with no new complaints. Blood counts and platelet levels within normal limits. Improved mobility and physical activity. - Encourage continued physical activity and use of walker for support  Follow-up - Return for treatment in four weeks - Perform myeloma panel one week before the next visit.   The patient was advised to call immediately if she has any other concerning symptoms in the interval. The patient voices understanding of current disease status and treatment options and is in agreement with the current care plan.  All questions were answered. The patient knows to call the clinic with any problems, questions or concerns. We can certainly see the patient much sooner if necessary.  The total time spent in the appointment was 20 minutes.  Disclaimer: This note was dictated with voice recognition software. Similar sounding words can inadvertently be transcribed and may not be corrected upon review.

## 2023-08-28 NOTE — Progress Notes (Signed)
Ok to give zometa today with Calcium level of 8.7 and T bili of 1.6 today per Dr. Arbutus Ped  Patient reports taking home dexamethasone this morning around 7 AM prior to infusion appointment.

## 2023-08-28 NOTE — Patient Instructions (Signed)
[facility] - A DEPT OF [facility]   Discharge Instructions: Thank you for choosing [facility] to provide your oncology and hematology care.   If you have a lab appointment with the Cancer Center, please go directly to the Cancer Center and check in at the registration area.   Wear comfortable clothing and clothing appropriate for easy access to any Portacath or PICC line.   We strive to give you quality time with your provider. You may need to reschedule your appointment if you arrive late (15 or more minutes).  Arriving late affects you and other patients whose appointments are after yours.  Also, if you miss three or more appointments without notifying the office, you may be dismissed from the clinic at the provider's discretion.      For prescription refill requests, have your pharmacy contact our office and allow 72 hours for refills to be completed.    Today you received the following chemotherapy and/or immunotherapy agents: Daratumumab hyaluronidase (Darzalex faspro)      To help prevent nausea and vomiting after your treatment, we encourage you to take your nausea medication as directed.  BELOW ARE SYMPTOMS THAT SHOULD BE REPORTED IMMEDIATELY: *FEVER GREATER THAN 100.4 F (38 C) OR HIGHER *CHILLS OR SWEATING *NAUSEA AND VOMITING THAT IS NOT CONTROLLED WITH YOUR NAUSEA MEDICATION *UNUSUAL SHORTNESS OF BREATH *UNUSUAL BRUISING OR BLEEDING *URINARY PROBLEMS (pain or burning when urinating, or frequent urination) *BOWEL PROBLEMS (unusual diarrhea, constipation, pain near the anus) TENDERNESS IN MOUTH AND THROAT WITH OR WITHOUT PRESENCE OF ULCERS (sore throat, sores in mouth, or a toothache) UNUSUAL RASH, SWELLING OR PAIN  UNUSUAL VAGINAL DISCHARGE OR ITCHING   Items with * indicate a potential emergency and should be followed up as soon as possible or go to the Emergency Department if any problems should occur.  Please show the  CHEMOTHERAPY ALERT CARD or IMMUNOTHERAPY ALERT CARD at check-in to the Emergency Department and triage nurse.  Should you have questions after your visit or need to cancel or reschedule your appointment, please contact [facility] - A DEPT OF [facility]  Dept: [phone]  and follow the prompts.  Office hours are 8:00 a.m. to 4:30 p.m. Monday - Friday. Please note that voicemails left after 4:00 p.m. may not be returned until the following business day.  We are closed weekends and major holidays. You have access to a nurse at all times for urgent questions. Please call the main number to the clinic Dept: [phone] and follow the prompts.   For any non-urgent questions, you may also contact your provider using MyChart. We now offer e-Visits for anyone 60 and older to request care online for non-urgent symptoms. For details visit [email or web address].   Also download the MyChart app! Go to the app store, search "MyChart", open the app, select [facility], and log in with your MyChart username and password.

## 2023-08-29 ENCOUNTER — Encounter: Payer: Self-pay | Admitting: Internal Medicine

## 2023-08-29 NOTE — Progress Notes (Signed)
CMP deleted from treatment plan, as patient is now solely on Darzalex Faspro per Dr. Arbutus Ped.

## 2023-08-29 NOTE — Addendum Note (Signed)
Addended by: Drusilla Kanner on: 08/29/2023 07:54 AM   Modules accepted: Orders

## 2023-08-31 ENCOUNTER — Other Ambulatory Visit: Payer: Self-pay

## 2023-09-11 ENCOUNTER — Other Ambulatory Visit: Payer: Self-pay | Admitting: Internal Medicine

## 2023-09-18 ENCOUNTER — Inpatient Hospital Stay: Payer: Medicare Other | Attending: Internal Medicine

## 2023-09-18 ENCOUNTER — Other Ambulatory Visit: Payer: Self-pay | Admitting: Medical Oncology

## 2023-09-18 DIAGNOSIS — Z79620 Long term (current) use of immunosuppressive biologic: Secondary | ICD-10-CM | POA: Insufficient documentation

## 2023-09-18 DIAGNOSIS — C9000 Multiple myeloma not having achieved remission: Secondary | ICD-10-CM

## 2023-09-18 DIAGNOSIS — Z5112 Encounter for antineoplastic immunotherapy: Secondary | ICD-10-CM | POA: Insufficient documentation

## 2023-09-18 LAB — CBC WITH DIFFERENTIAL (CANCER CENTER ONLY)
Abs Immature Granulocytes: 0.06 10*3/uL (ref 0.00–0.07)
Basophils Absolute: 0.1 10*3/uL (ref 0.0–0.1)
Basophils Relative: 1 %
Eosinophils Absolute: 0.2 10*3/uL (ref 0.0–0.5)
Eosinophils Relative: 2 %
HCT: 43.9 % (ref 39.0–52.0)
Hemoglobin: 14.6 g/dL (ref 13.0–17.0)
Immature Granulocytes: 1 %
Lymphocytes Relative: 4 %
Lymphs Abs: 0.5 10*3/uL — ABNORMAL LOW (ref 0.7–4.0)
MCH: 33.7 pg (ref 26.0–34.0)
MCHC: 33.3 g/dL (ref 30.0–36.0)
MCV: 101.4 fL — ABNORMAL HIGH (ref 80.0–100.0)
Monocytes Absolute: 0.4 10*3/uL (ref 0.1–1.0)
Monocytes Relative: 4 %
Neutro Abs: 10.3 10*3/uL — ABNORMAL HIGH (ref 1.7–7.7)
Neutrophils Relative %: 88 %
Platelet Count: 128 10*3/uL — ABNORMAL LOW (ref 150–400)
RBC: 4.33 MIL/uL (ref 4.22–5.81)
RDW: 14.5 % (ref 11.5–15.5)
WBC Count: 11.5 10*3/uL — ABNORMAL HIGH (ref 4.0–10.5)
nRBC: 0 % (ref 0.0–0.2)

## 2023-09-19 LAB — KAPPA/LAMBDA LIGHT CHAINS
Kappa free light chain: 3.1 mg/L — ABNORMAL LOW (ref 3.3–19.4)
Kappa, lambda light chain ratio: 1.72 — ABNORMAL HIGH (ref 0.26–1.65)
Lambda free light chains: 1.8 mg/L — ABNORMAL LOW (ref 5.7–26.3)

## 2023-09-20 LAB — BETA 2 MICROGLOBULIN, SERUM: Beta-2 Microglobulin: 2 mg/L (ref 0.6–2.4)

## 2023-09-20 LAB — IGG, IGA, IGM
IgA: 7 mg/dL — ABNORMAL LOW (ref 61–437)
IgG (Immunoglobin G), Serum: 221 mg/dL — ABNORMAL LOW (ref 603–1613)
IgM (Immunoglobulin M), Srm: 16 mg/dL (ref 15–143)

## 2023-09-21 ENCOUNTER — Ambulatory Visit: Payer: Medicare Other | Attending: Cardiovascular Disease

## 2023-09-21 DIAGNOSIS — I6349 Cerebral infarction due to embolism of other cerebral artery: Secondary | ICD-10-CM | POA: Diagnosis not present

## 2023-09-21 DIAGNOSIS — Z7901 Long term (current) use of anticoagulants: Secondary | ICD-10-CM | POA: Diagnosis not present

## 2023-09-21 DIAGNOSIS — Z79899 Other long term (current) drug therapy: Secondary | ICD-10-CM | POA: Diagnosis not present

## 2023-09-21 DIAGNOSIS — I4819 Other persistent atrial fibrillation: Secondary | ICD-10-CM

## 2023-09-21 LAB — POCT INR: INR: 3.9 — AB (ref 2.0–3.0)

## 2023-09-21 NOTE — Patient Instructions (Signed)
Description   Skip today's dosage of Warfarin, then resume same dosage of warfarin 1/2 tablet daily EXCEPT 1 tablet on Sundays, Tuesdays and Thursdays. Continue eating broccoli every other day.  Recheck INR 3 weeks.  Anticoagulation [phone].

## 2023-09-22 NOTE — Progress Notes (Signed)
[facility] OFFICE PROGRESS NOTE  Farris Has, MD [address] [place] [address]  DIAGNOSIS:  1) Multiple myeloma, presented with plasmacytoma as well as other lytic lesions in the bone diagnosed in March 2024 2) right lower lobe pulmonary nodule suspicious for non-small cell lung cancer  PRIOR THERAPY: SBRT to the right lower lobe pulmonary nodule under the care of Dr. Kathrynn Running.   CURRENT THERAPY: 1) Systemic chemotherapy with daratumumab subcutaneously weekly in addition to Velcade 1.3 Mg/M2 on days 1, 4, 8, 11 every 3 weeks as well as Revlimid 25 mg p.o. daily for 14 days every 3 weeks and Decadron 20 mg p.o. weekly with chemotherapy.  First cycle expected on 12/05/2022.  Status post 12 cycles. Revlimid has been on hold since June/May 2024. Velcade being held or cycle #7 due to PN and diarrhea.  2) Zometa monthly, first dose on 07/03/23  INTERVAL HISTORY: Ronald Duran 83 y.o. male returns to the clinic today for a follow-up visit.  The patient was last seen in the clinic by Dr. Arbutus Ped on 08/28/23  The patient is currently on treatment with Darzalex and Decadron.  Velcade and Revlimid were discontinued due to side effects, cytopenias, and peripheral neuropathy.  At his last appointment Dr. Arbutus Ped, he was feeling well except he has some ongoing back pain but overall noticed improvement in his mobility.   Today he does complain of pain that occurs below the left knee. This started about 10 days ago and he saw his PCP. They drew a uric acid level which was normal. They think it is likely arthritis and recommended elevation and ice to the area. He denies any trauma to the area. He has some intermittent chronic pitting edema of the lower extremities (L>R) due to prior left hip surgery. If no improvement, his PCP was going to consider him for x-ray.   Otherwise he is feeling fairly well today. He denies any fever, chills, or night sweats. He denies any  breathing changes, nausea, vomiting, headaches, vision changes, signs of infection, or abnormal bleeding or bruising. He has some peripheral neuropathy in his hands and feet that he describes as tingling. His appetite has been good. The patient had repeat myeloma labs.  The patient  is here today for evaluation and repeat blood work.    MEDICAL HISTORY: Past Medical History:  Diagnosis Date   Atrial fibrillation (HCC)    CAD (coronary artery disease)    a. s/p MI with LAD stent placement December, 1999 -last stress test November 2008 with no EKG evidence of ischemia  b. cath 07/23/2015 after abnormal ETT and nuc, occluded mid LAD stent with good distal collateral, medical therapy    Cardiomyopathy (HCC)    Ischemic cardiomyopathy with EF 40% by echo 2005   COVID 2021   mild case   Dyslipidemia    Erectile dysfunction    GERD (gastroesophageal reflux disease)    Hiatal hernia    with esophageal strictures and dilatations in the past - Dr. [name]   History of nuclear stress test    Myoview 12/16: EF 47%, anterior, anteroseptal and inferoseptal defect suggesting infarct with peri-infarct ischemia, intermediate risk   HTN (hypertension)    Hx of seasonal allergies    Hyperlipidemia    Myocardial infarction (HCC) 08/01/1998   PAD (peripheral artery disease) (HCC)    Pre-diabetes    Prostate cancer (HCC)    with seed implant therapy following Lupron therapy in 2008  and 2009 - followed by Dr. Pollyann Glen    Stroke [facility]) 04/20/2017   seen on a MRI   Vitamin D deficiency     ALLERGIES:  has no known allergies.  MEDICATIONS:  Current Outpatient Medications  Medication Sig Dispense Refill   acyclovir (ZOVIRAX) 200 MG capsule Take 1 capsule (200 mg total) by mouth 2 (two) times daily. 60 capsule 5   atorvastatin (LIPITOR) 40 MG tablet Take 40 mg by mouth at bedtime.     B Complex Vitamins (B COMPLEX 100) tablet Take 1 tablet by mouth daily. Balance     cetirizine (ZYRTEC  ALLERGY) 10 MG tablet Take 1 tablet (10 mg total) by mouth daily. 30 tablet 0   Cholecalciferol (VITAMIN D3) 2000 UNITS TABS Take 2,000 Units by mouth daily.     ciclopirox (PENLAC) 8 % solution Apply topically at bedtime. Apply over nail and surrounding skin. Apply daily over previous coat. After seven (7) days, may remove with alcohol and continue cycle. 6.6 mL 2   Coenzyme Q10 (CO Q-10) 100 MG CAPS Take 100 mg by mouth daily with supper.     dexamethasone (DECADRON) 4 MG tablet TAKE 5 tabS weekly, start with the first dose of chemotherapy. 40 tablet 3   ketoconazole (NIZORAL) 2 % cream Apply 1 Application topically daily as needed for irritation.     loperamide (IMODIUM) 2 MG capsule Take 1 capsule (2 mg total) by mouth every 6 (six) hours as needed for diarrhea or loose stools. 30 capsule 0   metoprolol tartrate (LOPRESSOR) 25 MG tablet Take 0.5 tablets (12.5 mg total) by mouth 2 (two) times daily. 90 tablet 3   Multiple Vitamins-Minerals (CENTRUM) tablet Take 1 tablet by mouth daily.     nitroGLYCERIN (NITROSTAT) 0.4 MG SL tablet PLACE 1 TABLET UNDER THE TONGUE IF NEEDED EVERY 5 MINUTES FOR CHEST PAIN FOR 3 DOSES IF NO RELIEF AFTER FIRST DOSE CALL 911. (Patient taking differently: Place 0.4 mg under the tongue every 5 (five) minutes as needed for chest pain.) 25 tablet 9   omeprazole (PRILOSEC) 20 MG capsule Take 20 mg by mouth daily before breakfast.     prochlorperazine (COMPAZINE) 10 MG tablet Take 1 tablet (10 mg total) by mouth every 6 (six) hours as needed for nausea or vomiting. 30 tablet 1   traMADol (ULTRAM) 50 MG tablet Take 1 tablet (50 mg total) by mouth every 12 (twelve) hours as needed. (Patient not taking: Reported on 06/16/2023) 30 tablet 0   triamcinolone ointment (KENALOG) 0.5 % Apply 1 Application topically 2 (two) times daily. (Patient taking differently: Apply 1 Application topically daily as needed (For left foot rash).) 30 g 0   warfarin (COUMADIN) 5 MG tablet TAKE 1/2 TO 1  TABLET BY MOUTH AS DIRECTED by coumadin clinic 30 tablet 5   No current facility-administered medications for this visit.   Facility-Administered Medications Ordered in Other Visits  Medication Dose Route Frequency Provider Last Rate Last Admin   daratumumab-hyaluronidase-fihj (DARZALEX FASPRO) 1800-30000 MG-UT/15ML chemo SQ injection 1,800 mg  1,800 mg Subcutaneous Once Si Gaul, MD       Zoledronic Acid (ZOMETA) IVPB 4 mg  4 mg Intravenous Once Si Gaul, MD 400 mL/hr at 09/25/23 1153 4 mg at 09/25/23 1153    SURGICAL HISTORY:  Past Surgical History:  Procedure Laterality Date   BRONCHIAL BIOPSY  11/15/2022   Procedure: BRONCHIAL BIOPSIES;  Surgeon: [name], DO;  Location: [facility] ENDOSCOPY;  Service: Pulmonary;;  BRONCHIAL BRUSHINGS  11/15/2022   Procedure: BRONCHIAL BRUSHINGS;  Surgeon: [name], DO;  Location: [facility] ENDOSCOPY;  Service: Pulmonary;;   BRONCHIAL NEEDLE ASPIRATION BIOPSY  11/15/2022   Procedure: BRONCHIAL NEEDLE ASPIRATION BIOPSIES;  Surgeon: [name], DO;  Location: [facility] ENDOSCOPY;  Service: Pulmonary;;   CARDIAC CATHETERIZATION N/A 07/23/2015   Procedure: Left Heart Cath and Coronary Angiography;  Surgeon: [name], MD;  Location: [facility] INVASIVE CV LAB;  Service: Cardiovascular;  Laterality: N/A;   CARDIOVERSION N/A 06/03/2022   Procedure: CARDIOVERSION;  Surgeon: [name], MD;  Location: [facility] ENDOSCOPY;  Service: Cardiovascular;  Laterality: N/A;   COLONOSCOPY WITH PROPOFOL N/A 11/24/2015   Procedure: COLONOSCOPY WITH PROPOFOL;  Surgeon: [name], MD;  Location: [facility] ENDOSCOPY;  Service: Endoscopy;  Laterality: N/A;   esophageal strictures with dilatations in the past per Dr. [name]     last '98   FIDUCIAL MARKER PLACEMENT  11/15/2022   Procedure: FIDUCIAL MARKER PLACEMENT;  Surgeon: [name], DO;  Location: [facility] ENDOSCOPY;  Service: Pulmonary;;   HERNIA REPAIR Left    INTRAMEDULLARY (IM) NAIL INTERTROCHANTERIC Left  10/25/2022   Procedure: LEFT INTRAMEDULLARY (IM) NAIL INTERTROCHANTERIC;  Surgeon: [name], MD;  Location: [facility] OR;  Service: Orthopedics;  Laterality: Left;   IR BONE MARROW BIOPSY & ASPIRATION  11/17/2022   IR BONE TUMOR(S)RF ABLATION  10/11/2022   IR KYPHO THORACIC WITH BONE BIOPSY  10/11/2022   LAD stent placement - Dr. [name]     left inguinal hernia repair - Dr. [name]     prostatic radioactive seed implantation - Dr. [name]     '09    REVIEW OF SYSTEMS:   Review of Systems  Constitutional: Positive for stable fatigue. Negative for appetite change, chills,  fever and unexpected weight change.  HENT: Negative for mouth sores, nosebleeds, sore throat and trouble swallowing.   Eyes: Negative for eye problems and icterus.  Respiratory: Negative for cough, hemoptysis, shortness of breath and wheezing.   Cardiovascular: Negative for chest pain. Positive for bilateral lower extremity pitting edema (L>R).  Gastrointestinal: Negative for abdominal pain, constipation, diarrhea, nausea and vomiting.  Genitourinary: Negative for bladder incontinence, difficulty urinating, dysuria, frequency and hematuria.   Musculoskeletal: Positive for occasional chronic back. Positive for left focal tenderness over the tibial plateau. Negative for gait problem, neck pain and neck stiffness.  Skin: Positive for rashes that come and go on feet bilaterally (not mentioned today).  Neurological: Negative for dizziness, extremity weakness, gait problem, headaches, light-headedness and seizures.  Hematological: Negative for adenopathy. Does not bruise/bleed easily.  Psychiatric/Behavioral: Negative for confusion, depression and sleep disturbance. The patient is not nervous/anxious.    PHYSICAL EXAMINATION:  Blood pressure 99/79, pulse 100, temperature (!) 97.3 F (36.3 C), temperature source Temporal, resp. rate 17, weight 175 lb 11.2 oz (79.7 kg), SpO2 100%.  ECOG PERFORMANCE STATUS: 1-2  Physical  Exam  Constitutional: Oriented to person, place, and time and thin appearing male, and in no distress.  HENT:  Head: Normocephalic and atraumatic.  Mouth/Throat: Oropharynx is clear and moist. No oropharyngeal exudate.  Eyes: Conjunctivae are normal. Right eye exhibits no discharge. Left eye exhibits no discharge. No scleral icterus.  Neck: Normal range of motion. Neck supple.  Cardiovascular: Normal rate, regular rhythm, normal heart sounds and intact distal pulses.   Pulmonary/Chest: Effort normal and breath sounds normal. No respiratory distress. No wheezes. No rales.  Abdominal: Soft. Bowel sounds are normal. Exhibits no distension  and no mass. There is no tenderness.  Musculoskeletal: Normal range of motion. Mild-moderate left leg pitting edema.   Lymphadenopathy:    No cervical adenopathy.  Neurological: Positive for tremor. Alert and oriented to person, place, and time. Exhibits normal muscle tone. Gait normal. Coordination normal.  Skin: Positive for few scattered skin lesions.  Positive for dry skin on lower extremity.   Skin: Skin is warm and dry. No rash noted. Not diaphoretic. No erythema. No pallor.  Psychiatric: Mood, memory and judgment normal.  Vitals reviewed.  LABORATORY DATA: Lab Results  Component Value Date   WBC 11.0 (H) 09/25/2023   HGB 15.8 09/25/2023   HCT 47.3 09/25/2023   MCV 101.5 (H) 09/25/2023   PLT 105 (L) 09/25/2023      Chemistry      Component Value Date/Time   NA 141 09/25/2023 0951   NA 141 08/25/2022 1440   K 3.8 09/25/2023 0951   CL 105 09/25/2023 0951   CO2 30 09/25/2023 0951   BUN 16 09/25/2023 0951   BUN 14 08/25/2022 1440   CREATININE 0.98 09/25/2023 0951   CREATININE 1.23 (H) 07/21/2015 0917      Component Value Date/Time   CALCIUM 9.3 09/25/2023 0951   ALKPHOS 71 09/25/2023 0951   AST 23 09/25/2023 0951   ALT 45 (H) 09/25/2023 0951   BILITOT 1.6 (H) 09/25/2023 0951       RADIOGRAPHIC STUDIES:  No results  found.   ASSESSMENT/PLAN:  This is a very pleasant 83 year old Caucasian male with multiple myeloma presented with multiple lytic lesions involving T10, left femoral neck as well as T4, T9 and T11 vertebral bodies.    He also had right lower lobe lung nodule that was suspicious for non-small cell lung cancer.    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    he biopsy from the left hip was negative for malignancy. His myeloma panel showed no concerning findings except for elevated IgG but the serum light chains were normal. The patient underwent left hip fixation for impending fracture. He also underwent palliative radiotherapy under the care of Dr. Kathrynn Running. His recent bone marrow biopsy and aspirate was consistent with plasma cell neoplasm with involvement of plasma cells 25% of the bone marrow.   The patient is currently on treatment with daratumumab, Velcade, Revlimid and dexamethasone started on 12/05/2022 status post 5 cycle. The patient has had treatment held periodically due to thrombocytopenia.  His dose of Revlimid was reduced to 15 milligrams due to thrombocytopenia.  However, his Revlimid was ultimately held starting from May/June 2024.    Velcade was discontinued as the patient was recovering from diarrhea, hypotension, and dizziness.  He also was developing peripheral neuropathy.  The patient recently had repeat myeloma labs performed.  The patient was seen with Dr. Arbutus Ped today.  Dr. Arbutus Ped reviewed the myeloma labs and discussed the results with the patient today. Dr. Arbutus Ped feels the labs are stable.    Labs were reviewed.  His labs today show stable but mildly elevated bilirubin at 1.6. Recommend he continue with dara every 4 weeks as scheduled today.    Will continue to receive Zometa monthly.   We will see him back for a follow-up visit in 4 weeks before his  next cycle of treatment.   The patient tells me that his cardiologist discontinued his Norvasc. .   The patient's  PCP is following his pain below the left knee. They have recommended ice and elevation. Consider an x-ray of the area if the pain does not improve.    The patient was advised to call immediately if he has any concerning symptoms in the interval. The patient voices understanding of current disease status and treatment options and is in agreement with the current care plan. All questions were answered. The patient knows to call the clinic with any problems, questions or concerns. We can certainly see the patient much sooner if necessary   No orders of the defined types were placed in this encounter.    Ronald Duran L Tami Blass, PA-C 09/25/23  ADDENDUM: Hematology/Oncology Attending:  I had a face-to-face encounter with the patient today.  I reviewed his record, lab and recommended his care plan.  This is a very pleasant 83 years old white male with multiple myeloma diagnosed in March 2024 in addition to right lower lobe pulmonary nodule suspicious for non-small cell lung cancer treated with SBRT.  The patient started systemic therapy with subcutaneous daratumumab, Velcade, Revlimid and Decadron and he is currently on treatment with daratumumab and Decadron every 4 weeks status post 12 cycles.  He has been tolerating this treatment fairly well with no concerning adverse effects except for mild fatigue.  He is also on monthly Zometa for the bone disease.  The patient had repeat myeloma panel performed recently.  I personally discussed the lab results with the patient and he continues to have good response to the treatment. I recommended for him to continue his current treatment with subcutaneous daratumumab and Decadron every 4 weeks. He will proceed with cycle #13 today. He will come back for follow-up visit in 4 weeks for evaluation before the next cycle of his treatment. The  patient was advised to call immediately if he has any other concerning symptoms in the interval. The total time spent in the appointment was 30 minutes. Disclaimer: This note was dictated with voice recognition software. Similar sounding words can inadvertently be transcribed and may be missed upon review. Lajuana Matte, MD

## 2023-09-25 ENCOUNTER — Inpatient Hospital Stay: Payer: Medicare Other

## 2023-09-25 ENCOUNTER — Inpatient Hospital Stay (HOSPITAL_BASED_OUTPATIENT_CLINIC_OR_DEPARTMENT_OTHER): Payer: Medicare Other | Admitting: Physician Assistant

## 2023-09-25 ENCOUNTER — Other Ambulatory Visit: Payer: Self-pay | Admitting: Physician Assistant

## 2023-09-25 VITALS — BP 99/79 | HR 100 | Temp 97.3°F | Resp 17 | Wt 175.7 lb

## 2023-09-25 DIAGNOSIS — C9000 Multiple myeloma not having achieved remission: Secondary | ICD-10-CM

## 2023-09-25 DIAGNOSIS — Z5112 Encounter for antineoplastic immunotherapy: Secondary | ICD-10-CM | POA: Diagnosis not present

## 2023-09-25 LAB — CBC WITH DIFFERENTIAL (CANCER CENTER ONLY)
Abs Immature Granulocytes: 0.05 10*3/uL (ref 0.00–0.07)
Basophils Absolute: 0.1 10*3/uL (ref 0.0–0.1)
Basophils Relative: 1 %
Eosinophils Absolute: 0.2 10*3/uL (ref 0.0–0.5)
Eosinophils Relative: 2 %
HCT: 47.3 % (ref 39.0–52.0)
Hemoglobin: 15.8 g/dL (ref 13.0–17.0)
Immature Granulocytes: 1 %
Lymphocytes Relative: 6 %
Lymphs Abs: 0.6 10*3/uL — ABNORMAL LOW (ref 0.7–4.0)
MCH: 33.9 pg (ref 26.0–34.0)
MCHC: 33.4 g/dL (ref 30.0–36.0)
MCV: 101.5 fL — ABNORMAL HIGH (ref 80.0–100.0)
Monocytes Absolute: 0.5 10*3/uL (ref 0.1–1.0)
Monocytes Relative: 5 %
Neutro Abs: 9.5 10*3/uL — ABNORMAL HIGH (ref 1.7–7.7)
Neutrophils Relative %: 85 %
Platelet Count: 105 10*3/uL — ABNORMAL LOW (ref 150–400)
RBC: 4.66 MIL/uL (ref 4.22–5.81)
RDW: 15.1 % (ref 11.5–15.5)
WBC Count: 11 10*3/uL — ABNORMAL HIGH (ref 4.0–10.5)
nRBC: 0 % (ref 0.0–0.2)

## 2023-09-25 LAB — CMP (CANCER CENTER ONLY)
ALT: 45 U/L — ABNORMAL HIGH (ref 0–44)
AST: 23 U/L (ref 15–41)
Albumin: 3.8 g/dL (ref 3.5–5.0)
Alkaline Phosphatase: 71 U/L (ref 38–126)
Anion gap: 6 (ref 5–15)
BUN: 16 mg/dL (ref 8–23)
CO2: 30 mmol/L (ref 22–32)
Calcium: 9.3 mg/dL (ref 8.9–10.3)
Chloride: 105 mmol/L (ref 98–111)
Creatinine: 0.98 mg/dL (ref 0.61–1.24)
GFR, Estimated: >60 mL/min (ref >60)
Glucose, Bld: 87 mg/dL (ref 70–99)
Potassium: 3.8 mmol/L (ref 3.5–5.1)
Sodium: 141 mmol/L (ref 135–145)
Total Bilirubin: 1.6 mg/dL — ABNORMAL HIGH (ref 0.0–1.2)
Total Protein: 5.6 g/dL — ABNORMAL LOW (ref 6.5–8.1)

## 2023-09-25 MED ORDER — ACETAMINOPHEN 325 MG PO TABS
650.0000 mg | ORAL_TABLET | Freq: Once | ORAL | Status: AC
  Administered 2023-09-25: 650 mg via ORAL
  Filled 2023-09-25: qty 2

## 2023-09-25 MED ORDER — DIPHENHYDRAMINE HCL 25 MG PO CAPS
50.0000 mg | ORAL_CAPSULE | Freq: Once | ORAL | Status: AC
  Administered 2023-09-25: 50 mg via ORAL
  Filled 2023-09-25: qty 2

## 2023-09-25 MED ORDER — ZOLEDRONIC ACID 4 MG/100ML IV SOLN
4.0000 mg | Freq: Once | INTRAVENOUS | Status: AC
  Administered 2023-09-25: 4 mg via INTRAVENOUS
  Filled 2023-09-25: qty 100

## 2023-09-25 MED ORDER — DARATUMUMAB-HYALURONIDASE-FIHJ 1800-30000 MG-UT/15ML ~~LOC~~ SOLN
1800.0000 mg | Freq: Once | SUBCUTANEOUS | Status: AC
  Administered 2023-09-25: 1800 mg via SUBCUTANEOUS
  Filled 2023-09-25: qty 15

## 2023-09-25 NOTE — Progress Notes (Signed)
Pt reports that he took Dex at home this morning

## 2023-09-25 NOTE — Patient Instructions (Signed)
[facility] - A DEPT OF [facility]  Discharge Instructions: Thank you for choosing [facility] to provide your oncology and hematology care.   If you have a lab appointment with the Cancer Center, please go directly to the Cancer Center and check in at the registration area.   Wear comfortable clothing and clothing appropriate for easy access to any Portacath or PICC line.   We strive to give you quality time with your provider. You may need to reschedule your appointment if you arrive late (15 or more minutes).  Arriving late affects you and other patients whose appointments are after yours.  Also, if you miss three or more appointments without notifying the office, you may be dismissed from the clinic at the provider's discretion.      For prescription refill requests, have your pharmacy contact our office and allow 72 hours for refills to be completed.    Today you received the following chemotherapy and/or immunotherapy agents: daratumumab-hyaluronidase-fihj and zometa      To help prevent nausea and vomiting after your treatment, we encourage you to take your nausea medication as directed.  BELOW ARE SYMPTOMS THAT SHOULD BE REPORTED IMMEDIATELY: *FEVER GREATER THAN 100.4 F (38 C) OR HIGHER *CHILLS OR SWEATING *NAUSEA AND VOMITING THAT IS NOT CONTROLLED WITH YOUR NAUSEA MEDICATION *UNUSUAL SHORTNESS OF BREATH *UNUSUAL BRUISING OR BLEEDING *URINARY PROBLEMS (pain or burning when urinating, or frequent urination) *BOWEL PROBLEMS (unusual diarrhea, constipation, pain near the anus) TENDERNESS IN MOUTH AND THROAT WITH OR WITHOUT PRESENCE OF ULCERS (sore throat, sores in mouth, or a toothache) UNUSUAL RASH, SWELLING OR PAIN  UNUSUAL VAGINAL DISCHARGE OR ITCHING   Items with * indicate a potential emergency and should be followed up as soon as possible or go to the Emergency Department if any problems should occur.  Please show the  CHEMOTHERAPY ALERT CARD or IMMUNOTHERAPY ALERT CARD at check-in to the Emergency Department and triage nurse.  Should you have questions after your visit or need to cancel or reschedule your appointment, please contact [facility] - A DEPT OF [facility]  Dept: [phone]  and follow the prompts.  Office hours are 8:00 a.m. to 4:30 p.m. Monday - Friday. Please note that voicemails left after 4:00 p.m. may not be returned until the following business day.  We are closed weekends and major holidays. You have access to a nurse at all times for urgent questions. Please call the main number to the clinic Dept: [phone] and follow the prompts.   For any non-urgent questions, you may also contact your provider using MyChart. We now offer e-Visits for anyone 1 and older to request care online for non-urgent symptoms. For details visit [email or web address].   Also download the MyChart app! Go to the app store, search "MyChart", open the app, select [place], and log in with your MyChart username and password.

## 2023-10-12 ENCOUNTER — Ambulatory Visit: Payer: Medicare Other | Attending: Cardiology

## 2023-10-12 DIAGNOSIS — I4819 Other persistent atrial fibrillation: Secondary | ICD-10-CM

## 2023-10-12 DIAGNOSIS — I6349 Cerebral infarction due to embolism of other cerebral artery: Secondary | ICD-10-CM | POA: Diagnosis not present

## 2023-10-12 DIAGNOSIS — Z79899 Other long term (current) drug therapy: Secondary | ICD-10-CM

## 2023-10-12 LAB — POCT INR: INR: 2.6 (ref 2.0–3.0)

## 2023-10-12 NOTE — Patient Instructions (Signed)
Description   Continue taking warfarin 1/2 tablet daily EXCEPT 1 tablet on Sundays, Tuesdays and Thursdays. Continue eating broccoli every other day.  Recheck INR 4 weeks.  Anticoagulation Clinic-[phone].

## 2023-10-16 NOTE — Progress Notes (Signed)
 [facility] OFFICE PROGRESS NOTE  Farris Has, MD [address] [place] [address]  DIAGNOSIS: 1) Multiple myeloma, presented with plasmacytoma as well as other lytic lesions in the bone diagnosed in March 2024 2) right lower lobe pulmonary nodule suspicious for non-small cell lung cancer  PRIOR THERAPY: SBRT to the right lower lobe pulmonary nodule under the care of Dr. Kathrynn Running.   CURRENT THERAPY:  1) Systemic chemotherapy with daratumumab subcutaneously weekly in addition to Velcade 1.3 Mg/M2 on days 1, 4, 8, 11 every 3 weeks as well as Revlimid 25 mg p.o. daily for 14 days every 3 weeks and Decadron 20 mg p.o. weekly with chemotherapy.  First cycle expected on 12/05/2022.  Status post 12 cycles. Revlimid has been on hold since June/May 2024. Velcade being held or cycle #7 due to PN and diarrhea.  2) Zometa monthly, first dose on 07/03/23  INTERVAL HISTORY: Ronald Duran 83 y.o. male returns to the clinic today for a follow-up visit accompanied by his wife.  The patient was last seen in the clinic by Dr. Arbutus Ped on 07/24/24   The patient is currently on treatment with Darzalex and Decadron.  Velcade and Revlimid were discontinued due to side effects, cytopenias, and peripheral neuropathy.   At his last appointment, he was endorsing some pain below his left knee which he saw his PCP who felt it was arthritis and recommended elevation and ice. If no improvement, his PCP was going to order x-ray. His symptoms improved the following day after he was last seen.  The patient has a history of instability in his left hip for which she previously underwent physical therapy.  He has been struggling with his balance recently and he is wondering if he would benefit from reevaluation and if he needs additional physical therapy.  He has been in touch with his old physical therapist who recommended having his provider put in a referral for [facility] home health and home  PT in order to facilitate the evaluation.   Otherwise he is feeling fairly well today. He denies any fever, chills, or night sweats.  The patient reports that he is eating well and he is wanting to know if his protein has improved at this time.  He sometimes drinks Ensure 9 g of protein and they are wondering if he should increase his protein intake.  He denies any breathing changes, nausea, vomiting, headaches, vision changes, signs of infection, or abnormal bleeding or bruising besides normal bruising with his blood thinner. 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 The patient  is here today for evaluation and repeat blood work.      MEDICAL HISTORY: Past Medical History:  Diagnosis Date   Atrial fibrillation (HCC)    CAD (coronary artery disease)    a. s/p MI with LAD stent placement December, 1999 -last stress test November 2008 with no EKG evidence of ischemia  b. cath 07/23/2015 after abnormal ETT and nuc, occluded mid LAD stent with good distal collateral, medical therapy    Cardiomyopathy (HCC)    Ischemic cardiomyopathy with EF 40% by echo 2005   COVID 2021   mild case   Dyslipidemia    Erectile dysfunction    GERD (gastroesophageal reflux disease)    Hiatal hernia    with esophageal strictures and dilatations in the past - Dr. [name]   History of nuclear stress  test    Myoview 12/16: EF 47%, anterior, anteroseptal and inferoseptal defect suggesting infarct with peri-infarct ischemia, intermediate risk   HTN (hypertension)    Hx of seasonal allergies    Hyperlipidemia    Myocardial infarction (HCC) 08/01/1998   PAD (peripheral artery disease) (HCC)    Pre-diabetes    Prostate cancer (HCC)    with seed implant therapy following Lupron therapy in 2008 and 2009 - followed by Dr. Isabel Caprice   Ringworm    Stroke [facility]) 04/20/2017   seen on a MRI   Vitamin D deficiency     ALLERGIES:  has no known  allergies.  MEDICATIONS:  Current Outpatient Medications  Medication Sig Dispense Refill   acyclovir (ZOVIRAX) 200 MG capsule Take 1 capsule (200 mg total) by mouth 2 (two) times daily. 60 capsule 5   atorvastatin (LIPITOR) 40 MG tablet Take 40 mg by mouth at bedtime.     B Complex Vitamins (B COMPLEX 100) tablet Take 1 tablet by mouth daily. Balance     cetirizine (ZYRTEC ALLERGY) 10 MG tablet Take 1 tablet (10 mg total) by mouth daily. 30 tablet 0   Cholecalciferol (VITAMIN D3) 2000 UNITS TABS Take 2,000 Units by mouth daily.     ciclopirox (PENLAC) 8 % solution Apply topically at bedtime. Apply over nail and surrounding skin. Apply daily over previous coat. After seven (7) days, may remove with alcohol and continue cycle. 6.6 mL 2   Coenzyme Q10 (CO Q-10) 100 MG CAPS Take 100 mg by mouth daily with supper.     dexamethasone (DECADRON) 4 MG tablet TAKE 5 tabS weekly, start with the first dose of chemotherapy. 40 tablet 3   ketoconazole (NIZORAL) 2 % cream Apply 1 Application topically daily as needed for irritation.     loperamide (IMODIUM) 2 MG capsule Take 1 capsule (2 mg total) by mouth every 6 (six) hours as needed for diarrhea or loose stools. 30 capsule 0   metoprolol tartrate (LOPRESSOR) 25 MG tablet Take 0.5 tablets (12.5 mg total) by mouth 2 (two) times daily. 90 tablet 3   Multiple Vitamins-Minerals (CENTRUM) tablet Take 1 tablet by mouth daily.     nitroGLYCERIN (NITROSTAT) 0.4 MG SL tablet PLACE 1 TABLET UNDER THE TONGUE IF NEEDED EVERY 5 MINUTES FOR CHEST PAIN FOR 3 DOSES IF NO RELIEF AFTER FIRST DOSE CALL 911. (Patient taking differently: Place 0.4 mg under the tongue every 5 (five) minutes as needed for chest pain.) 25 tablet 9   omeprazole (PRILOSEC) 20 MG capsule Take 20 mg by mouth daily before breakfast.     prochlorperazine (COMPAZINE) 10 MG tablet Take 1 tablet (10 mg total) by mouth every 6 (six) hours as needed for nausea or vomiting. 30 tablet 1   traMADol (ULTRAM) 50 MG  tablet Take 1 tablet (50 mg total) by mouth every 12 (twelve) hours as needed. 30 tablet 0   triamcinolone ointment (KENALOG) 0.5 % Apply 1 Application topically 2 (two) times daily. (Patient taking differently: Apply 1 Application topically daily as needed (For left foot rash).) 30 g 0   warfarin (COUMADIN) 5 MG tablet TAKE 1/2 TO 1 TABLET BY MOUTH AS DIRECTED by coumadin clinic 30 tablet 5   No current facility-administered medications for this visit.    SURGICAL HISTORY:  Past Surgical History:  Procedure Laterality Date   BRONCHIAL BIOPSY  11/15/2022   Procedure: BRONCHIAL BIOPSIES;  Surgeon: [name], DO;  Location: [facility] ENDOSCOPY;  Service: Pulmonary;;   BRONCHIAL BRUSHINGS  11/15/2022   Procedure: BRONCHIAL BRUSHINGS;  Surgeon: [name], DO;  Location: [facility] ENDOSCOPY;  Service: Pulmonary;;   BRONCHIAL NEEDLE ASPIRATION BIOPSY  11/15/2022   Procedure: BRONCHIAL NEEDLE ASPIRATION BIOPSIES;  Surgeon: [name], DO;  Location: [facility] ENDOSCOPY;  Service: Pulmonary;;   CARDIAC CATHETERIZATION N/A 07/23/2015   Procedure: Left Heart Cath and Coronary Angiography;  Surgeon: [name], MD;  Location: [facility] INVASIVE CV LAB;  Service: Cardiovascular;  Laterality: N/A;   CARDIOVERSION N/A 06/03/2022   Procedure: CARDIOVERSION;  Surgeon: [name], MD;  Location: [facility] ENDOSCOPY;  Service: Cardiovascular;  Laterality: N/A;   COLONOSCOPY WITH PROPOFOL N/A 11/24/2015   Procedure: COLONOSCOPY WITH PROPOFOL;  Surgeon: [name], MD;  Location: [facility] ENDOSCOPY;  Service: Endoscopy;  Laterality: N/A;   esophageal strictures with dilatations in the past per Dr. [name]     last '98   FIDUCIAL MARKER PLACEMENT  11/15/2022   Procedure: FIDUCIAL MARKER PLACEMENT;  Surgeon: [name], DO;  Location: [facility] ENDOSCOPY;  Service: Pulmonary;;   HERNIA REPAIR Left    INTRAMEDULLARY (IM) NAIL INTERTROCHANTERIC Left 10/25/2022   Procedure: LEFT INTRAMEDULLARY (IM) NAIL INTERTROCHANTERIC;   Surgeon: [name], MD;  Location: [facility] OR;  Service: Orthopedics;  Laterality: Left;   IR BONE MARROW BIOPSY & ASPIRATION  11/17/2022   IR BONE TUMOR(S)RF ABLATION  10/11/2022   IR KYPHO THORACIC WITH BONE BIOPSY  10/11/2022   LAD stent placement - Dr. [name]     left inguinal hernia repair - Dr. [name]     prostatic radioactive seed implantation - Dr. [name]     '09    REVIEW OF SYSTEMS:   Constitutional: Positive for stable fatigue. Negative for appetite change, chills,  fever and unexpected weight change.  HENT: Negative for mouth sores, nosebleeds, sore throat and trouble swallowing.   Eyes: Negative for eye problems and icterus.  Respiratory: Negative for cough, hemoptysis, shortness of breath and wheezing.   Cardiovascular: Negative for chest pain. Positive for stable mild bilateral lower extremity pitting edema (L>R).  Gastrointestinal: Negative for abdominal pain, constipation, diarrhea, nausea and vomiting.  Genitourinary: Negative for bladder incontinence, difficulty urinating, dysuria, frequency and hematuria.   Musculoskeletal: Positive for occasional chronic back. Negative for gait problem, neck pain and neck stiffness.  Skin: Positive for rashes that come and go on feet bilaterally (not mentioned today). Positive for dry skin.  Neurological: Negative for dizziness, extremity weakness, gait problem, headaches, light-headedness and seizures.  Hematological: Negative for adenopathy. Does not bleed easily. Positive for baseline bruising due to blood thinner use.  Psychiatric/Behavioral: Negative for confusion, depression and sleep disturbance. The patient is not nervous/anxious.        PHYSICAL EXAMINATION:  Blood pressure (!) 116/92, pulse 70, temperature (!) 97 F (36.1 C), temperature source Temporal, resp. rate 16, weight 181 lb 8 oz (82.3 kg), SpO2 97%.  ECOG PERFORMANCE STATUS: 1  Physical Exam  Constitutional: Oriented to person, place, and time and thin  appearing male, and in no distress.  HENT:  Head: Normocephalic and atraumatic.  Mouth/Throat: Oropharynx is clear and moist. No oropharyngeal exudate.  Eyes: Conjunctivae are normal. Right eye exhibits no discharge. Left eye exhibits no discharge. No scleral icterus.  Neck: Normal range of motion. Neck supple.  Cardiovascular: Normal rate, regular rhythm, normal heart sounds and intact distal pulses.   Pulmonary/Chest: Effort normal and breath sounds normal. No respiratory distress. No wheezes. No rales.  Abdominal: Soft. Bowel sounds are  normal. Exhibits no distension and no mass. There is no tenderness.  Musculoskeletal: Normal range of motion. Mild-moderate left leg pitting edema.   Lymphadenopathy:    No cervical adenopathy.  Neurological: Positive for tremor. Alert and oriented to person, place, and time. Exhibits normal muscle tone. Gait normal. Coordination normal.  Skin: Positive for few scattered skin lesions.  Positive for dry skin on lower extremity.   Skin: Skin is warm and dry. No rash noted. Not diaphoretic. No erythema. No pallor.  Psychiatric: Mood, memory and judgment normal.  Vitals reviewed.  LABORATORY DATA: Lab Results  Component Value Date   WBC 11.6 (H) 10/23/2023   HGB 15.2 10/23/2023   HCT 45.2 10/23/2023   MCV 100.2 (H) 10/23/2023   PLT 114 (L) 10/23/2023      Chemistry      Component Value Date/Time   NA 140 10/23/2023 0928   NA 141 08/25/2022 1440   K 4.2 10/23/2023 0928   CL 109 10/23/2023 0928   CO2 25 10/23/2023 0928   BUN 21 10/23/2023 0928   BUN 14 08/25/2022 1440   CREATININE 1.08 10/23/2023 0928   CREATININE 1.23 (H) 07/21/2015 0917      Component Value Date/Time   CALCIUM 8.9 10/23/2023 0928   ALKPHOS 78 10/23/2023 0928   AST 23 10/23/2023 0928   ALT 32 10/23/2023 0928   BILITOT 1.4 (H) 10/23/2023 0928       RADIOGRAPHIC STUDIES:  No results found.   ASSESSMENT/PLAN:  This is a very pleasant 83 year old Caucasian male with  multiple myeloma presented with multiple lytic lesions involving T10, left femoral neck as well as T4, T9 and T11 vertebral bodies.    He also had right lower lobe lung nodule that was suspicious for non-small cell lung cancer.    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    he biopsy from the left hip was negative for malignancy. His myeloma panel showed no concerning findings except for elevated IgG but the serum light chains were normal. The patient underwent left hip fixation for impending fracture. He also underwent palliative radiotherapy under the care of Dr. Kathrynn Running. His recent bone marrow biopsy and aspirate was consistent with plasma cell neoplasm with involvement of plasma cells 25% of the bone marrow.   The patient is currently on treatment with daratumumab, Velcade, Revlimid and dexamethasone started on 12/05/2022 status post 5 cycle. The patient has had treatment held periodically due to thrombocytopenia.  His dose of Revlimid was reduced to 15 milligrams due to thrombocytopenia.  However, his Revlimid was ultimately held starting from May/June 2024.    Velcade was discontinued as the patient was recovering from diarrhea, hypotension, and dizziness.  He also was developing peripheral neuropathy.   Labs were reviewed. Recommend he continue with dara every 4 weeks as scheduled today. He will proceed with treatment today as scheduled.    Will continue to receive Zometa monthly.   We will see him back for a follow-up visit in 4 weeks before his next cycle of treatment.   I will put in orders for home health PT due to his generalized weakness and instability to see if he would benefit for additional PT.   He will increase his protein intake with ensure.    The patient was advised to call immediately if he has any concerning symptoms in the interval. The patient voices  understanding of current disease status and treatment options and is in agreement with the current care plan. All questions were answered. The patient knows to call the clinic with any problems, questions or concerns. We can certainly see the patient much sooner if necessary   Orders Placed This Encounter  Procedures   CBC with Differential (Cancer Center Only)    Standing Status:   Future    Expected Date:   12/18/2023    Expiration Date:   12/17/2024   CBC with Differential (Cancer Center Only)    Standing Status:   Future    Expected Date:   01/15/2024    Expiration Date:   01/14/2025   CBC with Differential (Cancer Center Only)    Standing Status:   Future    Expected Date:   02/12/2024    Expiration Date:   02/11/2025   CBC with Differential (Cancer Center Only)    Standing Status:   Future    Expected Date:   03/11/2024    Expiration Date:   03/11/2025   CBC with Differential (Cancer Center Only)    Standing Status:   Future    Expected Date:   04/08/2024    Expiration Date:   04/08/2025   CBC with Differential (Cancer Center Only)    Standing Status:   Future    Expected Date:   05/06/2024    Expiration Date:   05/06/2025     The total time spent in the appointment was 20-29 minutes  Riham Polyakov L Nyema Hachey, PA-C 10/23/23

## 2023-10-17 ENCOUNTER — Encounter: Payer: Self-pay | Admitting: Podiatry

## 2023-10-17 ENCOUNTER — Ambulatory Visit (INDEPENDENT_AMBULATORY_CARE_PROVIDER_SITE_OTHER): Payer: Medicare Other | Admitting: Podiatry

## 2023-10-17 DIAGNOSIS — L84 Corns and callosities: Secondary | ICD-10-CM | POA: Diagnosis not present

## 2023-10-17 DIAGNOSIS — M79675 Pain in left toe(s): Secondary | ICD-10-CM

## 2023-10-17 DIAGNOSIS — B351 Tinea unguium: Secondary | ICD-10-CM | POA: Diagnosis not present

## 2023-10-17 DIAGNOSIS — M79674 Pain in right toe(s): Secondary | ICD-10-CM

## 2023-10-17 DIAGNOSIS — Z7901 Long term (current) use of anticoagulants: Secondary | ICD-10-CM

## 2023-10-18 ENCOUNTER — Other Ambulatory Visit: Payer: Self-pay

## 2023-10-18 NOTE — Progress Notes (Signed)
 Subjective:   Patient ID: Ronald Duran, male   DOB: 83 y.o.   MRN: [id number]   HPI Chief Complaint  Patient presents with   RFC    RM#13 RFC/CALLUS patient states has callus on right foot and fungus on finger nails.    83 year old male presents the office today with above concerns.  States his nails are thickened elongated he cannot trim himself may cause discomfort as well as for callus on the bottom of the right foot is causing pain.  He is also dry skin and using moisturizer.  He also did see a dermatologist.  She is told to continue with the current treatment of the dry skin.   No open lesion, injuries or any new concerns today.  Review of Systems  All other systems reviewed and are negative.     Objective:  Physical Exam  General: AAO x3, NAD  Dermatological: Hyperkeratotic lesion to the right foot submetatarsal 1 and 5.  Under the submetatarsal 5 lesion there are some dried blood and is preulcerative there is no skin breakdown noted today.  Nails are hypertrophic, dystrophic, brittle, discolored, elongated 10. No surrounding redness or drainage. Tenderness nails 1-5 bilaterally.   Vascular: Dorsalis Pedis artery and Posterior Tibial artery pedal pulses are palpable bilateral with immedate capillary fill time. Chonic lower extremity edema   Neruologic: Grossly intact via light touch bilateral.   Musculoskeletal: Bunion deformity noted with second toe overlapping the hallux.     Assessment:   83 year old male with preulcerative calluses, bunion/hammertoe deformity, onychauxis     Plan:   Hyperkeratotic, preulcerative callus on warfarin -Sharply debride lesions x 2 without any complications or bleeding.  Discussed moisturizer, offloading.  Discussed shoe modifications.  Onychomycosis -Sharply debrided nails x 10 without any complications or bleeding. -Discussed treatment options.  Continue Penlac.  Discussed duration of use.  Return in about 3 months (around  01/17/2024).  Vivi Barrack DPM

## 2023-10-23 ENCOUNTER — Inpatient Hospital Stay: Payer: Medicare Other

## 2023-10-23 ENCOUNTER — Other Ambulatory Visit: Payer: Self-pay | Admitting: Physician Assistant

## 2023-10-23 ENCOUNTER — Other Ambulatory Visit: Payer: Self-pay

## 2023-10-23 ENCOUNTER — Inpatient Hospital Stay: Payer: Medicare Other | Attending: Internal Medicine

## 2023-10-23 ENCOUNTER — Inpatient Hospital Stay (HOSPITAL_BASED_OUTPATIENT_CLINIC_OR_DEPARTMENT_OTHER): Payer: Medicare Other | Admitting: Physician Assistant

## 2023-10-23 ENCOUNTER — Telehealth: Payer: Self-pay

## 2023-10-23 VITALS — BP 116/92 | HR 70 | Temp 97.0°F | Resp 16 | Wt 181.5 lb

## 2023-10-23 DIAGNOSIS — C9000 Multiple myeloma not having achieved remission: Secondary | ICD-10-CM | POA: Diagnosis not present

## 2023-10-23 DIAGNOSIS — Z79620 Long term (current) use of immunosuppressive biologic: Secondary | ICD-10-CM | POA: Diagnosis not present

## 2023-10-23 DIAGNOSIS — C3491 Malignant neoplasm of unspecified part of right bronchus or lung: Secondary | ICD-10-CM | POA: Diagnosis not present

## 2023-10-23 DIAGNOSIS — Z5112 Encounter for antineoplastic immunotherapy: Secondary | ICD-10-CM | POA: Insufficient documentation

## 2023-10-23 LAB — CMP (CANCER CENTER ONLY)
ALT: 32 U/L (ref 0–44)
AST: 23 U/L (ref 15–41)
Albumin: 3.6 g/dL (ref 3.5–5.0)
Alkaline Phosphatase: 78 U/L (ref 38–126)
Anion gap: 6 (ref 5–15)
BUN: 21 mg/dL (ref 8–23)
CO2: 25 mmol/L (ref 22–32)
Calcium: 8.9 mg/dL (ref 8.9–10.3)
Chloride: 109 mmol/L (ref 98–111)
Creatinine: 1.08 mg/dL (ref 0.61–1.24)
GFR, Estimated: >60 mL/min (ref >60)
Glucose, Bld: 112 mg/dL — ABNORMAL HIGH (ref 70–99)
Potassium: 4.2 mmol/L (ref 3.5–5.1)
Sodium: 140 mmol/L (ref 135–145)
Total Bilirubin: 1.4 mg/dL — ABNORMAL HIGH (ref 0.0–1.2)
Total Protein: 5.5 g/dL — ABNORMAL LOW (ref 6.5–8.1)

## 2023-10-23 LAB — CBC WITH DIFFERENTIAL (CANCER CENTER ONLY)
Abs Immature Granulocytes: 0.05 10*3/uL (ref 0.00–0.07)
Basophils Absolute: 0.1 10*3/uL (ref 0.0–0.1)
Basophils Relative: 1 %
Eosinophils Absolute: 0.2 10*3/uL (ref 0.0–0.5)
Eosinophils Relative: 1 %
HCT: 45.2 % (ref 39.0–52.0)
Hemoglobin: 15.2 g/dL (ref 13.0–17.0)
Immature Granulocytes: 0 %
Lymphocytes Relative: 5 %
Lymphs Abs: 0.5 10*3/uL — ABNORMAL LOW (ref 0.7–4.0)
MCH: 33.7 pg (ref 26.0–34.0)
MCHC: 33.6 g/dL (ref 30.0–36.0)
MCV: 100.2 fL — ABNORMAL HIGH (ref 80.0–100.0)
Monocytes Absolute: 0.5 10*3/uL (ref 0.1–1.0)
Monocytes Relative: 4 %
Neutro Abs: 10.3 10*3/uL — ABNORMAL HIGH (ref 1.7–7.7)
Neutrophils Relative %: 89 %
Platelet Count: 114 10*3/uL — ABNORMAL LOW (ref 150–400)
RBC: 4.51 MIL/uL (ref 4.22–5.81)
RDW: 15.8 % — ABNORMAL HIGH (ref 11.5–15.5)
WBC Count: 11.6 10*3/uL — ABNORMAL HIGH (ref 4.0–10.5)
nRBC: 0 % (ref 0.0–0.2)

## 2023-10-23 MED ORDER — ZOLEDRONIC ACID 4 MG/100ML IV SOLN
4.0000 mg | Freq: Once | INTRAVENOUS | Status: AC
  Administered 2023-10-23: 4 mg via INTRAVENOUS
  Filled 2023-10-23: qty 100

## 2023-10-23 MED ORDER — DIPHENHYDRAMINE HCL 25 MG PO CAPS
50.0000 mg | ORAL_CAPSULE | Freq: Once | ORAL | Status: AC
  Administered 2023-10-23: 50 mg via ORAL
  Filled 2023-10-23: qty 2

## 2023-10-23 MED ORDER — DARATUMUMAB-HYALURONIDASE-FIHJ 1800-30000 MG-UT/15ML ~~LOC~~ SOLN
1800.0000 mg | Freq: Once | SUBCUTANEOUS | Status: AC
  Administered 2023-10-23: 1800 mg via SUBCUTANEOUS
  Filled 2023-10-23: qty 15

## 2023-10-23 MED ORDER — ACETAMINOPHEN 325 MG PO TABS
650.0000 mg | ORAL_TABLET | Freq: Once | ORAL | Status: AC
  Administered 2023-10-23: 650 mg via ORAL
  Filled 2023-10-23: qty 2

## 2023-10-23 NOTE — Progress Notes (Signed)
 Patient took home dexamethasone around 07:30 today

## 2023-10-23 NOTE — Telephone Encounter (Signed)
 Referral placed to [facility] for PT.  Fax confirmed at [phone].

## 2023-11-09 ENCOUNTER — Ambulatory Visit: Attending: Internal Medicine | Admitting: *Deleted

## 2023-11-09 DIAGNOSIS — I4819 Other persistent atrial fibrillation: Secondary | ICD-10-CM

## 2023-11-09 DIAGNOSIS — Z79899 Other long term (current) drug therapy: Secondary | ICD-10-CM | POA: Diagnosis not present

## 2023-11-09 DIAGNOSIS — I6349 Cerebral infarction due to embolism of other cerebral artery: Secondary | ICD-10-CM

## 2023-11-09 LAB — POCT INR: POC INR: 2.9

## 2023-11-09 NOTE — Patient Instructions (Signed)
 Description   Continue taking warfarin 1/2 tablet daily EXCEPT 1 tablet on Sundays, Tuesdays and Thursdays.  Continue eating broccoli every other day.  Recheck INR 5 weeks.  Anticoagulation Clinic-[phone].       1st Floor: - Lobby - Registration  - Pharmacy  - Lab - Cafe  2nd Floor: - PV Lab - Diagnostic Testing (echo, CT, nuclear med)  3rd Floor: - Vacant  4th Floor: - TCTS (cardiothoracic surgery) - AFib Clinic - Structural Heart Clinic - Vascular Surgery  - Vascular Ultrasound  5th Floor: - [facility] (general and EP) - Clinical Pharmacy for coumadin, hypertension, lipid, weight-loss medications, and med management appointments    Valet parking services will be available as well.

## 2023-11-13 ENCOUNTER — Encounter: Payer: Self-pay | Admitting: Internal Medicine

## 2023-11-20 ENCOUNTER — Inpatient Hospital Stay (HOSPITAL_BASED_OUTPATIENT_CLINIC_OR_DEPARTMENT_OTHER): Payer: Medicare Other | Admitting: Internal Medicine

## 2023-11-20 ENCOUNTER — Inpatient Hospital Stay: Payer: Medicare Other

## 2023-11-20 ENCOUNTER — Inpatient Hospital Stay: Payer: Medicare Other | Attending: Internal Medicine

## 2023-11-20 ENCOUNTER — Other Ambulatory Visit: Payer: Self-pay

## 2023-11-20 VITALS — HR 96

## 2023-11-20 VITALS — BP 110/77 | HR 104 | Temp 97.6°F | Resp 16 | Ht 72.0 in | Wt 186.4 lb

## 2023-11-20 DIAGNOSIS — Z5112 Encounter for antineoplastic immunotherapy: Secondary | ICD-10-CM | POA: Diagnosis present

## 2023-11-20 DIAGNOSIS — C9000 Multiple myeloma not having achieved remission: Secondary | ICD-10-CM

## 2023-11-20 DIAGNOSIS — Z79620 Long term (current) use of immunosuppressive biologic: Secondary | ICD-10-CM | POA: Diagnosis not present

## 2023-11-20 LAB — CMP (CANCER CENTER ONLY)
ALT: 23 U/L (ref 0–44)
AST: 20 U/L (ref 15–41)
Albumin: 3.8 g/dL (ref 3.5–5.0)
Alkaline Phosphatase: 78 U/L (ref 38–126)
Anion gap: 6 (ref 5–15)
BUN: 20 mg/dL (ref 8–23)
CO2: 27 mmol/L (ref 22–32)
Calcium: 9.5 mg/dL (ref 8.9–10.3)
Chloride: 106 mmol/L (ref 98–111)
Creatinine: 1.21 mg/dL (ref 0.61–1.24)
GFR, Estimated: >60 mL/min (ref >60)
Glucose, Bld: 119 mg/dL — ABNORMAL HIGH (ref 70–99)
Potassium: 3.9 mmol/L (ref 3.5–5.1)
Sodium: 139 mmol/L (ref 135–145)
Total Bilirubin: 1.4 mg/dL — ABNORMAL HIGH (ref 0.0–1.2)
Total Protein: 5.7 g/dL — ABNORMAL LOW (ref 6.5–8.1)

## 2023-11-20 LAB — CBC WITH DIFFERENTIAL (CANCER CENTER ONLY)
Abs Immature Granulocytes: 0.05 10*3/uL (ref 0.00–0.07)
Basophils Absolute: 0.1 10*3/uL (ref 0.0–0.1)
Basophils Relative: 1 %
Eosinophils Absolute: 0.2 10*3/uL (ref 0.0–0.5)
Eosinophils Relative: 1 %
HCT: 45.7 % (ref 39.0–52.0)
Hemoglobin: 15.2 g/dL (ref 13.0–17.0)
Immature Granulocytes: 0 %
Lymphocytes Relative: 3 %
Lymphs Abs: 0.4 10*3/uL — ABNORMAL LOW (ref 0.7–4.0)
MCH: 32.9 pg (ref 26.0–34.0)
MCHC: 33.3 g/dL (ref 30.0–36.0)
MCV: 98.9 fL (ref 80.0–100.0)
Monocytes Absolute: 0.4 10*3/uL (ref 0.1–1.0)
Monocytes Relative: 4 %
Neutro Abs: 10.8 10*3/uL — ABNORMAL HIGH (ref 1.7–7.7)
Neutrophils Relative %: 91 %
Platelet Count: 145 10*3/uL — ABNORMAL LOW (ref 150–400)
RBC: 4.62 MIL/uL (ref 4.22–5.81)
RDW: 15.9 % — ABNORMAL HIGH (ref 11.5–15.5)
WBC Count: 11.9 10*3/uL — ABNORMAL HIGH (ref 4.0–10.5)
nRBC: 0 % (ref 0.0–0.2)

## 2023-11-20 MED ORDER — DARATUMUMAB-HYALURONIDASE-FIHJ 1800-30000 MG-UT/15ML ~~LOC~~ SOLN
1800.0000 mg | Freq: Once | SUBCUTANEOUS | Status: AC
  Administered 2023-11-20: 1800 mg via SUBCUTANEOUS
  Filled 2023-11-20: qty 15

## 2023-11-20 MED ORDER — DIPHENHYDRAMINE HCL 25 MG PO CAPS
50.0000 mg | ORAL_CAPSULE | Freq: Once | ORAL | Status: AC
  Administered 2023-11-20: 50 mg via ORAL
  Filled 2023-11-20: qty 2

## 2023-11-20 MED ORDER — ZOLEDRONIC ACID 4 MG/100ML IV SOLN
4.0000 mg | Freq: Once | INTRAVENOUS | Status: AC
  Administered 2023-11-20: 4 mg via INTRAVENOUS
  Filled 2023-11-20: qty 100

## 2023-11-20 MED ORDER — SODIUM CHLORIDE 0.9 % IV SOLN: INTRAVENOUS | Status: DC

## 2023-11-20 MED ORDER — ACETAMINOPHEN 325 MG PO TABS
650.0000 mg | ORAL_TABLET | Freq: Once | ORAL | Status: AC
  Administered 2023-11-20: 650 mg via ORAL
  Filled 2023-11-20: qty 2

## 2023-11-20 NOTE — Progress Notes (Signed)
 Patient took his steroid this morning at about 0745.

## 2023-11-20 NOTE — Progress Notes (Signed)
 [facility] [phone]   [phone]  OFFICE PROGRESS NOTE  Farris Has, MD [address] [place] [address]  DIAGNOSIS:  1) Multiple myeloma, presented with plasmacytoma as well as other lytic lesions in the bone diagnosed in March 2024 2) right lower lobe pulmonary nodule suspicious for non-small cell lung cancer  PRIOR THERAPY: SBRT to the right lower lobe pulmonary nodule under the care of Dr. Kathrynn Running.  CURRENT THERAPY:  1) Systemic chemotherapy with daratumumab subcutaneously weekly in addition to Velcade 1.3 Mg/M2 on days 1, 4, 8, 11 every 3 weeks as well as Revlimid 25 mg p.o. daily for 14 days every 3 weeks and Decadron 20 mg p.o. weekly with chemotherapy.  First cycle expected on 12/05/2022.  Status post 13 cycles.  Velcade and Revlimid were discontinued secondary to significant peripheral neuropathy and pancytopenia.  He is currently on maintenance treatment with daratumumab every 4 weeks in addition to Decadron 20 mg p.o. on weekly basis. 2) Zometa 4 mg IV infusion every 4 weeks.  INTERVAL HISTORY: Ronald Duran 83 y.o. male returns to the clinic today for follow-up visit accompanied by his wife. Discussed the use of AI scribe software for clinical note transcription with the patient, who gave verbal consent to proceed.  History of Present Illness   Ronald Duran is a 83 year old male with multiple myeloma who presents for evaluation before starting cycle 14 of treatment. He is accompanied by his wife.  He was diagnosed with multiple myeloma in March 2024 and has been undergoing treatment since then. Initially, he was on a regimen of subcutaneous daratumumab, subcutaneous Velcade, Revlimid, and Decadron. Currently, he is maintained on daratumumab and Decadron only, as Velcade and Revlimid have been discontinued. He has completed 13 cycles of treatment and is here for evaluation before starting cycle 14. He  describes his condition as stable, feeling 'much the same' as four weeks ago, with no drastic improvement or decline. He experiences mild symptoms from the medication, particularly the steroid, but they are not severe.  He has a history of a right lower lobe pulmonary nodule, which was suspicious for non-small cell lung cancer and was treated with stereotactic body radiation therapy (SBRT).  He is receiving Zometa every four weeks for bone disease related to his multiple myeloma.  He has been using a cane for mobility after consulting with a physical therapist who evaluated his balance and deemed it appropriate. He had previously discontinued physical therapy but resumed it to assess his ability to use a cane.        MEDICAL HISTORY: Past Medical History:  Diagnosis Date   Atrial fibrillation (HCC)    CAD (coronary artery disease)    a. s/p MI with LAD stent placement December, 1999 -last stress test November 2008 with no EKG evidence of ischemia  b. cath 07/23/2015 after abnormal ETT and nuc, occluded mid LAD stent with good distal collateral, medical therapy    Cardiomyopathy (HCC)    Ischemic cardiomyopathy with EF 40% by echo 2005   COVID 2021   mild case   Dyslipidemia    Erectile dysfunction    GERD (gastroesophageal reflux disease)    Hiatal hernia    with esophageal strictures and dilatations in the past - Dr. [name]   History of nuclear stress test    Myoview 12/16: EF 47%, anterior, anteroseptal and inferoseptal defect suggesting infarct with peri-infarct ischemia, intermediate risk  HTN (hypertension)    Hx of seasonal allergies    Hyperlipidemia    Myocardial infarction [facility]) 08/01/1998   PAD (peripheral artery disease) (HCC)    Pre-diabetes    Prostate cancer (HCC)    with seed implant therapy following Lupron therapy in 2008 and 2009 - followed by Dr. Isabel Caprice   Ringworm    Stroke [facility]) 04/20/2017   seen on a MRI   Vitamin D deficiency     ALLERGIES:  has  no known allergies.  MEDICATIONS:  Current Outpatient Medications  Medication Sig Dispense Refill   acyclovir (ZOVIRAX) 200 MG capsule Take 1 capsule (200 mg total) by mouth 2 (two) times daily. 60 capsule 5   atorvastatin (LIPITOR) 40 MG tablet Take 40 mg by mouth at bedtime.     B Complex Vitamins (B COMPLEX 100) tablet Take 1 tablet by mouth daily. Balance     cetirizine (ZYRTEC ALLERGY) 10 MG tablet Take 1 tablet (10 mg total) by mouth daily. 30 tablet 0   Cholecalciferol (VITAMIN D3) 2000 UNITS TABS Take 2,000 Units by mouth daily.     ciclopirox (PENLAC) 8 % solution Apply topically at bedtime. Apply over nail and surrounding skin. Apply daily over previous coat. After seven (7) days, may remove with alcohol and continue cycle. 6.6 mL 2   Coenzyme Q10 (CO Q-10) 100 MG CAPS Take 100 mg by mouth daily with supper.     dexamethasone (DECADRON) 4 MG tablet TAKE 5 tabS weekly, start with the first dose of chemotherapy. 40 tablet 3   ketoconazole (NIZORAL) 2 % cream Apply 1 Application topically daily as needed for irritation.     loperamide (IMODIUM) 2 MG capsule Take 1 capsule (2 mg total) by mouth every 6 (six) hours as needed for diarrhea or loose stools. 30 capsule 0   metoprolol tartrate (LOPRESSOR) 25 MG tablet Take 0.5 tablets (12.5 mg total) by mouth 2 (two) times daily. 90 tablet 3   Multiple Vitamins-Minerals (CENTRUM) tablet Take 1 tablet by mouth daily.     nitroGLYCERIN (NITROSTAT) 0.4 MG SL tablet PLACE 1 TABLET UNDER THE TONGUE IF NEEDED EVERY 5 MINUTES FOR CHEST PAIN FOR 3 DOSES IF NO RELIEF AFTER FIRST DOSE CALL 911. (Patient taking differently: Place 0.4 mg under the tongue every 5 (five) minutes as needed for chest pain.) 25 tablet 9   omeprazole (PRILOSEC) 20 MG capsule Take 20 mg by mouth daily before breakfast.     prochlorperazine (COMPAZINE) 10 MG tablet Take 1 tablet (10 mg total) by mouth every 6 (six) hours as needed for nausea or vomiting. 30 tablet 1   traMADol  (ULTRAM) 50 MG tablet Take 1 tablet (50 mg total) by mouth every 12 (twelve) hours as needed. 30 tablet 0   triamcinolone ointment (KENALOG) 0.5 % Apply 1 Application topically 2 (two) times daily. (Patient taking differently: Apply 1 Application topically daily as needed (For left foot rash).) 30 g 0   warfarin (COUMADIN) 5 MG tablet TAKE 1/2 TO 1 TABLET BY MOUTH AS DIRECTED by coumadin clinic 30 tablet 5   No current facility-administered medications for this visit.    SURGICAL HISTORY:  Past Surgical History:  Procedure Laterality Date   BRONCHIAL BIOPSY  11/15/2022   Procedure: BRONCHIAL BIOPSIES;  Surgeon: [name], DO;  Location: [facility] ENDOSCOPY;  Service: Pulmonary;;   BRONCHIAL BRUSHINGS  11/15/2022   Procedure: BRONCHIAL BRUSHINGS;  Surgeon: [name], DO;  Location: [facility] ENDOSCOPY;  Service: Pulmonary;;  BRONCHIAL NEEDLE ASPIRATION BIOPSY  11/15/2022   Procedure: BRONCHIAL NEEDLE ASPIRATION BIOPSIES;  Surgeon: [name], DO;  Location: [facility] ENDOSCOPY;  Service: Pulmonary;;   CARDIAC CATHETERIZATION N/A 07/23/2015   Procedure: Left Heart Cath and Coronary Angiography;  Surgeon: [name], MD;  Location: [facility] INVASIVE CV LAB;  Service: Cardiovascular;  Laterality: N/A;   CARDIOVERSION N/A 06/03/2022   Procedure: CARDIOVERSION;  Surgeon: [name], MD;  Location: [facility] ENDOSCOPY;  Service: Cardiovascular;  Laterality: N/A;   COLONOSCOPY WITH PROPOFOL N/A 11/24/2015   Procedure: COLONOSCOPY WITH PROPOFOL;  Surgeon: [name], MD;  Location: [facility] ENDOSCOPY;  Service: Endoscopy;  Laterality: N/A;   esophageal strictures with dilatations in the past per Dr. [name]     last '98   FIDUCIAL MARKER PLACEMENT  11/15/2022   Procedure: FIDUCIAL MARKER PLACEMENT;  Surgeon: [name], DO;  Location: [facility] ENDOSCOPY;  Service: Pulmonary;;   HERNIA REPAIR Left    INTRAMEDULLARY (IM) NAIL INTERTROCHANTERIC Left 10/25/2022   Procedure: LEFT INTRAMEDULLARY (IM) NAIL  INTERTROCHANTERIC;  Surgeon: [name], MD;  Location: [facility] OR;  Service: Orthopedics;  Laterality: Left;   IR BONE MARROW BIOPSY & ASPIRATION  11/17/2022   IR BONE TUMOR(S)RF ABLATION  10/11/2022   IR KYPHO THORACIC WITH BONE BIOPSY  10/11/2022   LAD stent placement - Dr. [name]     left inguinal hernia repair - Dr. [name]     prostatic radioactive seed implantation - Dr. [name]     '09    REVIEW OF SYSTEMS:  A comprehensive review of systems was negative except for: Constitutional: positive for fatigue Musculoskeletal: positive for arthralgias   PHYSICAL EXAMINATION: General appearance: alert, cooperative, fatigued, and no distress Head: Normocephalic, without obvious abnormality, atraumatic Neck: no adenopathy, no JVD, supple, symmetrical, trachea midline, and thyroid not enlarged, symmetric, no tenderness/mass/nodules Lymph nodes: Cervical, supraclavicular, and axillary nodes normal. Resp: clear to auscultation bilaterally Back: symmetric, no curvature. ROM normal. No CVA tenderness. Cardio: irregularly irregular rhythm GI: soft, non-tender; bowel sounds normal; no masses,  no organomegaly Extremities: extremities normal, atraumatic, no cyanosis or edema  ECOG PERFORMANCE STATUS: 1 - Symptomatic but completely ambulatory  Blood pressure 110/77, pulse (!) 104, temperature 97.6 F (36.4 C), resp. rate 16, height 6' (1.829 m), weight 186 lb 6.4 oz (84.6 kg), SpO2 98%.  LABORATORY DATA: Lab Results  Component Value Date   WBC 11.9 (H) 11/20/2023   HGB 15.2 11/20/2023   HCT 45.7 11/20/2023   MCV 98.9 11/20/2023   PLT 145 (L) 11/20/2023      Chemistry      Component Value Date/Time   NA 139 11/20/2023 0951   NA 141 08/25/2022 1440   K 3.9 11/20/2023 0951   CL 106 11/20/2023 0951   CO2 27 11/20/2023 0951   BUN 20 11/20/2023 0951   BUN 14 08/25/2022 1440   CREATININE 1.21 11/20/2023 0951   CREATININE 1.23 (H) 07/21/2015 0917      Component Value Date/Time    CALCIUM 9.5 11/20/2023 0951   ALKPHOS 78 11/20/2023 0951   AST 20 11/20/2023 0951   ALT 23 11/20/2023 0951   BILITOT 1.4 (H) 11/20/2023 0951       RADIOGRAPHIC STUDIES: No results found.  ASSESSMENT AND PLAN: This is a very pleasant 83 years old white male with multiple myeloma presented with multiple lytic lesions involving T10, left femoral neck as well as T4, T9 and T11 vertebral bodies. Also had right lower lobe  lung nodule that was suspicious for non-small cell lung cancer. 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 The biopsy from the left hip was negative for malignancy. His myeloma panel showed no concerning findings except for elevated IgG but the serum light chains were normal. The patient underwent left hip fixation for impending fracture. He also underwent palliative radiotherapy under the care of Dr. Lorri Rota. His recent bone marrow biopsy and aspirate was consistent with plasma cell neoplasm with involvement of plasma cells 25% of the bone marrow. The patient is currently on treatment with daratumumab and dexamethasone started on 12/05/2022 status post 14 cycles.  Velcade and Revlimid were discontinued secondary to adverse effect and intolerance.   For the suspicious right lower lobe lung cancer, he underwent SBRT under the care of Dr. Lorri Rota.    Multiple Myeloma Diagnosed in March 2024, currently well-managed after 13 cycles of treatment. He is on daratumumab and Decadron, with Velcade and Revlimid discontinued. Reports no significant symptoms and tolerates the current regimen well. Maintains daily activities and uses a cane after physical therapy evaluation. - Proceed with cycle 14 of daratumumab and Decadron - Repeat myeloma panel in three weeks before the trip to [facility]  Bone Disease Bone disease associated with multiple myeloma, managed with Zemaira every  four weeks. - Continue Zemaira every four weeks  Right Lower Lobe Pulmonary Nodule Previously treated with stereotactic body radiation therapy for a nodule suspicious for non-small cell lung cancer. No current symptoms reported.  Irregular Heart Rate Irregular heart rate noted during examination. No further details or concerns discussed.  Follow-up Plans to travel to [facility] on June 25th, which should not interfere with the treatment schedule. Myeloma panel to be completed before the trip. - Schedule follow-up appointment in four weeks   The patient was advised to call immediately if he has any concerning symptoms in the interval.  The patient voices understanding of current disease status and treatment options and is in agreement with the current care plan.  All questions were answered. The patient knows to call the clinic with any problems, questions or concerns. We can certainly see the patient much sooner if necessary.  The total time spent in the appointment was 20 minutes.  Disclaimer: This note was dictated with voice recognition software. Similar sounding words can inadvertently be transcribed and may not be corrected upon review.

## 2023-11-28 ENCOUNTER — Telehealth: Payer: Self-pay

## 2023-11-28 NOTE — Telephone Encounter (Signed)
 I spoke to patient who will start Doxycycline 4/23 x 7 days.  He scheduled INR for 4/29.  He will start taking 0.5 tablet daily until appt.

## 2023-11-30 ENCOUNTER — Telehealth: Payer: Self-pay | Admitting: Cardiovascular Disease

## 2023-11-30 NOTE — Telephone Encounter (Signed)
 Pt is requesting a callback regarding him being seen at pcp office yesterday. After labs were done and results were received, the pcp had concerns and gave recommendations but also advised pt to contact his cardiologist to give update and discuss further. Please advise

## 2023-11-30 NOTE — Telephone Encounter (Signed)
 Spoke with patient and he states labs were done at PCP office.   His BNP was high and PCP advised to continue on lasix, keep appointment with her, and follow up with cardiologist for further recommendations.  He has been SOB and he thought it was the pollen but realize its because of his heart. He also have some swelling in his legs.   He would like to know if provider can see him sooner.   Denies any chest or SOB today. ED precautions discussed

## 2023-11-30 NOTE — Telephone Encounter (Signed)
 Spoke with patient and shared Ronald Duran recommendation:  Ronald Duran last can see DOD next week or Ronald Duran Lasix 20 mg daily and not on ACE/ARNI consider adding after office visit if BP permits   Patient states he has Rx for Lasix 20 mg daily and took a dose yesterday and this morning. Added to med list. Patient to continue taking Lasix daily. Appt scheduled with DOD Dr. [name] on 4/30 at 10:40 AM.   Patient verbalized understanding of the above and expressed appreciation for follow-up.

## 2023-12-04 ENCOUNTER — Telehealth: Payer: Self-pay | Admitting: *Deleted

## 2023-12-04 NOTE — Telephone Encounter (Signed)
 Called pt and had to leave a message to remind him of the upcoming appointment tomorrow at the new location [address] 5 inside the building and not parking deck.

## 2023-12-05 ENCOUNTER — Ambulatory Visit

## 2023-12-06 ENCOUNTER — Ambulatory Visit: Attending: Cardiovascular Disease | Admitting: Cardiovascular Disease

## 2023-12-06 ENCOUNTER — Ambulatory Visit

## 2023-12-06 ENCOUNTER — Encounter: Payer: Self-pay | Admitting: Cardiovascular Disease

## 2023-12-06 VITALS — BP 98/82 | HR 95 | Ht 72.0 in | Wt 186.8 lb

## 2023-12-06 DIAGNOSIS — I6349 Cerebral infarction due to embolism of other cerebral artery: Secondary | ICD-10-CM

## 2023-12-06 DIAGNOSIS — I4821 Permanent atrial fibrillation: Secondary | ICD-10-CM | POA: Diagnosis not present

## 2023-12-06 DIAGNOSIS — I4819 Other persistent atrial fibrillation: Secondary | ICD-10-CM

## 2023-12-06 DIAGNOSIS — C9000 Multiple myeloma not having achieved remission: Secondary | ICD-10-CM

## 2023-12-06 DIAGNOSIS — E782 Mixed hyperlipidemia: Secondary | ICD-10-CM

## 2023-12-06 DIAGNOSIS — I2510 Atherosclerotic heart disease of native coronary artery without angina pectoris: Secondary | ICD-10-CM

## 2023-12-06 DIAGNOSIS — D6869 Other thrombophilia: Secondary | ICD-10-CM

## 2023-12-06 DIAGNOSIS — Z79899 Other long term (current) drug therapy: Secondary | ICD-10-CM

## 2023-12-06 DIAGNOSIS — I10 Essential (primary) hypertension: Secondary | ICD-10-CM

## 2023-12-06 DIAGNOSIS — I5022 Chronic systolic (congestive) heart failure: Secondary | ICD-10-CM

## 2023-12-06 LAB — POCT INR: INR: 2.9 (ref 2.0–3.0)

## 2023-12-06 MED ORDER — POTASSIUM CHLORIDE CRYS ER 20 MEQ PO TBCR
20.0000 meq | EXTENDED_RELEASE_TABLET | Freq: Every day | ORAL | 11 refills | Status: AC
Start: 2023-12-06 — End: ?

## 2023-12-06 MED ORDER — FUROSEMIDE 40 MG PO TABS: 40.0000 mg | ORAL_TABLET | Freq: Every day | ORAL | 11 refills | Status: DC

## 2023-12-06 NOTE — Progress Notes (Signed)
 Cardiology Office Note:    Date:  12/07/2023   ID:  Ronald Duran, DOB [birth year], MRN [id number]  PCP:  Ronna Coho, MD   [facility] Providers Cardiologist:  Janelle Mediate, MD     Referring MD: Ronna Coho, MD   Chief Complaint  Patient presents with   Shortness of Breath   Edema    History of Present Illness:    Ronald Duran is a 83 y.o. male with a hx of HFrEF (most recent echo June 2024 with EF 35-40%), due to ischemic cardiomyopathy, CAD (acute MI with LAD stent 1999, total occlusion of mid LAD after first diagonal by cath 2016), persistent atrial fibrillation on chronic anticoagulation with warfarin, multiple myeloma, non-small cell lung cancer treated with XRT, here for an acute visit due to wheezing and lower extremity edema.  He has chronic borderline low blood pressure.  At his last appointment in cardiology clinic 06/16/2023 he weighed 75 kg, but today he weighs almost 10 kg more.  The weight gain seems to have mostly occurred in the last 2 months.  He lost a lot of weight during his hospitalization with protracted diarrhea in November and it appeared that he was just gaining back lost weight, but he now has a lot of edema as well as shortness of breath.  The left leg is slightly more swollen, but in truth both of them have fairly significant edema.  He has a small skin laceration on the left leg which has been weeping pale yellowish, clear fluid.  He does not have fever or chills or any pain in the legs.  He is able to lie in an adjustable bed and actually keep his legs elevated without complaining of orthopnea or PND.  He does have mild exertional dyspnea and has heard some wheezing.  This improved after he was prescribed furosemide  20 mg once daily for a week, even though he only lost 2 pounds.  His home scale today showed 182 pounds, roughly 4-5 pounds less than our office scale.  He has not had any chest pain, dizziness, palpitations, syncope, focal  neurological events, intermittent claudication, falls or bleeding problems.  Past Medical History:  Diagnosis Date   Atrial fibrillation (HCC)    CAD (coronary artery disease)    a. s/p MI with LAD stent placement December, 1999 -last stress test November 2008 with no EKG evidence of ischemia  b. cath 07/23/2015 after abnormal ETT and nuc, occluded mid LAD stent with good distal collateral, medical therapy    Cardiomyopathy (HCC)    Ischemic cardiomyopathy with EF 40% by echo 2005   COVID 2021   mild case   Dyslipidemia    Erectile dysfunction    GERD (gastroesophageal reflux disease)    Hiatal hernia    with esophageal strictures and dilatations in the past - Dr. Joshua Nieves   History of nuclear stress test    Myoview 12/16: EF 47%, anterior, anteroseptal and inferoseptal defect suggesting infarct with peri-infarct ischemia, intermediate risk   HTN (hypertension)    Hx of seasonal allergies    Hyperlipidemia    Myocardial infarction (HCC) 08/01/1998   PAD (peripheral artery disease) (HCC)    Pre-diabetes    Prostate cancer (HCC)    with seed implant therapy following Lupron therapy in 2008 and 2009 - followed by Dr. Bosie Bye   Ringworm    Stroke (HCC) 04/20/2017   seen on a MRI   Vitamin D deficiency     Past  Surgical History:  Procedure Laterality Date   BRONCHIAL BIOPSY  11/15/2022   Procedure: BRONCHIAL BIOPSIES;  Surgeon: Prudy Brownie, DO;  Location: [facility] ENDOSCOPY;  Service: Pulmonary;;   BRONCHIAL BRUSHINGS  11/15/2022   Procedure: BRONCHIAL BRUSHINGS;  Surgeon: Prudy Brownie, DO;  Location: [facility] ENDOSCOPY;  Service: Pulmonary;;   BRONCHIAL NEEDLE ASPIRATION BIOPSY  11/15/2022   Procedure: BRONCHIAL NEEDLE ASPIRATION BIOPSIES;  Surgeon: Prudy Brownie, DO;  Location: [facility] ENDOSCOPY;  Service: Pulmonary;;   CARDIAC CATHETERIZATION N/A 07/23/2015   Procedure: Left Heart Cath and Coronary Angiography;  Surgeon: Arty Binning, MD;  Location: [facility] INVASIVE CV LAB;  Service:  Cardiovascular;  Laterality: N/A;   CARDIOVERSION N/A 06/03/2022   Procedure: CARDIOVERSION;  Surgeon: Elmyra Haggard, MD;  Location: [facility] ENDOSCOPY;  Service: Cardiovascular;  Laterality: N/A;   COLONOSCOPY WITH PROPOFOL  N/A 11/24/2015   Procedure: COLONOSCOPY WITH PROPOFOL ;  Surgeon: Garrett Kallman, MD;  Location: [facility] ENDOSCOPY;  Service: Endoscopy;  Laterality: N/A;   esophageal strictures with dilatations in the past per Dr. Joshua Nieves     last '98   FIDUCIAL MARKER PLACEMENT  11/15/2022   Procedure: FIDUCIAL MARKER PLACEMENT;  Surgeon: Prudy Brownie, DO;  Location: [facility] ENDOSCOPY;  Service: Pulmonary;;   HERNIA REPAIR Left    INTRAMEDULLARY (IM) NAIL INTERTROCHANTERIC Left 10/25/2022   Procedure: LEFT INTRAMEDULLARY (IM) NAIL INTERTROCHANTERIC;  Surgeon: Wes Hamman, MD;  Location: [facility] OR;  Service: Orthopedics;  Laterality: Left;   IR BONE MARROW BIOPSY & ASPIRATION  11/17/2022   IR BONE TUMOR(S)RF ABLATION  10/11/2022   IR KYPHO THORACIC WITH BONE BIOPSY  10/11/2022   LAD stent placement - Dr. Kay Parson     left inguinal hernia repair - Dr. Criselda Dolly     prostatic radioactive seed implantation - Dr. Ike Malady     '09    Current Medications: Current Meds  Medication Sig   acyclovir  (ZOVIRAX ) 200 MG capsule Take 1 capsule (200 mg total) by mouth 2 (two) times daily.   atorvastatin  (LIPITOR) 40 MG tablet Take 40 mg by mouth at bedtime.   B Complex Vitamins (B COMPLEX 100) tablet Take 1 tablet by mouth daily. Balance   cetirizine  (ZYRTEC  ALLERGY) 10 MG tablet Take 1 tablet (10 mg total) by mouth daily.   Cholecalciferol (VITAMIN D3) 2000 UNITS TABS Take 2,000 Units by mouth daily.   ciclopirox  (PENLAC ) 8 % solution Apply topically at bedtime. Apply over nail and surrounding skin. Apply daily over previous coat. After seven (7) days, may remove with alcohol and continue cycle.   Coenzyme Q10 (CO Q-10) 100 MG CAPS Take 100 mg by mouth daily with supper.   cyanocobalamin (VITAMIN B12) 1000  MCG tablet Take 1,000 mcg by mouth daily.   dexamethasone  (DECADRON ) 4 MG tablet TAKE 5 tabS weekly, start with the first dose of chemotherapy.   metoprolol  tartrate (LOPRESSOR ) 25 MG tablet Take 0.5 tablets (12.5 mg total) by mouth 2 (two) times daily.   Multiple Vitamins-Minerals (CENTRUM) tablet Take 1 tablet by mouth daily.   potassium chloride  SA (KLOR-CON  M) 20 MEQ tablet Take 1 tablet (20 mEq total) by mouth daily.   warfarin (COUMADIN ) 5 MG tablet TAKE 1/2 TO 1 TABLET BY MOUTH AS DIRECTED by coumadin  clinic     Allergies:   Patient has no known allergies.   Family History: The patient's family history includes CAD in his sister; CVA in his mother; CVA (age of onset: 15) in his father; Diabetes in  his father and sister; Heart disease in his father.  ROS:   Please see the history of present illness.     All other systems reviewed and are negative.  EKGs/Labs/Other Studies Reviewed:    The following studies were reviewed today:  EKG Interpretation Date/Time:  Wednesday December 06 2023 11:03:59 EDT Ventricular Rate:  95 PR Interval:    QRS Duration:  82 QT Interval:  282 QTC Calculation: 354 R Axis:   -4  Text Interpretation: Atrial fibrillation Anteroseptal infarct (cited on or before 02-Apr-2023) When compared with ECG of 03-Apr-2023 06:41, Questionable change in QRS axis QT has shortened Confirmed by Taisa Deloria ([id number]) on 12/06/2023 11:14:06 AM       Echocardiogram 01/25/2023    1. Left ventricular ejection fraction, by estimation, is 35-40%. The left  ventricle has normal function. The left ventricle has no regional wall  motion abnormalities. The left ventricular internal cavity size was  moderately dilated. Left ventricular  diastolic function could not be evaluated. There is akinesis of the left  ventricular, inferior wall, lateral wall and anterior wall. There is  akinesis of the left ventricular, apical segment. 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   2. Right ventricular systolic function is normal. The right ventricular  size is normal. Tricuspid regurgitation signal is inadequate for assessing  PA pressure.   3. The mitral valve is normal in structure. Trivial mitral valve  regurgitation. No evidence of mitral stenosis.   4. The aortic valve was not well visualized. Aortic valve regurgitation  is not visualized. Aortic valve sclerosis/calcification is present,  without any evidence of aortic stenosis.   5. The inferior vena cava is dilated in size with >50% respiratory  variability, suggesting right atrial pressure of 8 mmHg.   Recent Labs: 01/24/2023: TSH 0.186 04/01/2023: Magnesium  1.8 11/20/2023: ALT 23; BUN 20; Creatinine 1.21; Hemoglobin 15.2; Platelet Count 145; Potassium 3.9; Sodium 139  Recent Lipid Panel No results found for: "CHOL", "TRIG", "HDL", "CHOLHDL", "VLDL", "LDLCALC", "LDLDIRECT" 07/13/2023 Cholesterol 158, HDL 63, LDL 76, triglycerides 107, hemoglobin A1c 5.8%   Risk Assessment/Calculations:    CHA2DS2-VASc Score = 7   This indicates a 11.2% annual risk of stroke. The patient's score is based upon: CHF History: 1 HTN History: 1 Diabetes History: 0 Stroke History: 2 Vascular Disease History: 1 Age Score: 2 Gender Score: 0               Physical Exam:    VS:  BP 98/82 (BP Location: Left Arm, Patient Position: Sitting)   Pulse 95   Ht 6' (1.829 m)   Wt 84.7 kg   SpO2 99%   BMI 25.33 kg/m     Wt Readings from Last 3 Encounters:  12/06/23 84.7 kg  11/20/23 84.6 kg  10/23/23 82.3 kg     GEN: Appears comfortable sitting up in chair, well nourished, well developed in no acute distress HEENT: Normal NECK: 9-10 cm JVD while sitting upright in chair; No carotid bruits LYMPHATICS: No lymphadenopathy CARDIAC: RRR, faint aortic ejection murmur, no diastolic murmurs, rubs, gallops RESPIRATORY:  Clear to auscultation without rales, wheezing  or rhonchi  ABDOMEN: Soft, non-tender, non-distended MUSCULOSKELETAL: 3+ hard pitting edema to the knees bilaterally SKIN: Warm and dry NEUROLOGIC:  Alert and oriented x 3 PSYCHIATRIC:  Normal affect   ASSESSMENT:    1. Chronic HFrEF (heart failure with reduced ejection fraction) (HCC)   2. Medication management  3. Permanent atrial fibrillation (HCC)   4. Acquired thrombophilia (HCC)   5. Coronary artery disease involving native coronary artery of native heart without angina pectoris   6. Mixed hyperlipidemia   7. Multiple myeloma, remission status unspecified (HCC)    PLAN:    In order of problems listed above:  CHF: He is clearly hypervolemic.  It is very difficult to know what his dry weight is anymore.  He lost a lot of true weight during his illness last fall.  Based on physical exam I think he is carrying a minimum of 10-15 pounds of extra fluid.  Thankfully dyspnea is not a big complaint and he is not orthopneic.  Since his blood pressure is fairly low, he is not on much in the way of medical therapy.  We will try to push diuresis gradually, hopefully not losing more than 1-2 pounds a day, to avoid worsening hypotension.  Will increase his furosemide  to 40 mg daily, add KCl. Asked him to log daily weights and BP. Check labs before his f/u visit in 3 weeks. Afib: Well rate controlled and appropriately anticoagulated.  On warfarin, INR today was 2.9.  He has not had a subtherapeutic INR in almost 12 months. Anticoagulation: No recent falls or any bleeding problems. CAD: Asymptomatic, no exertional angina.  Chronic occlusion of the mid LAD artery with distal vessel filling via collaterals.  On appropriate lipid-lowering therapy.  Not on aspirin  due to full anticoagulation.  On a low-dose of beta-blocker. HLP: All lipid parameters are in target range.  Continue same dose of atorvastatin . Multiple myeloma: Increases risk of renal dysfunction.  Recheck labs in a few weeks.            Medication Adjustments/Labs and Tests Ordered: Current medicines are reviewed at length with the patient today.  Concerns regarding medicines are outlined above.  Orders Placed This Encounter  Procedures   Basic metabolic panel with GFR   Magnesium    Brain natriuretic peptide   EKG 12-Lead   Meds ordered this encounter  Medications   furosemide  (LASIX ) 40 MG tablet    Sig: Take 1 tablet (40 mg total) by mouth daily.    Dispense:  30 tablet    Refill:  11   potassium chloride  SA (KLOR-CON  M) 20 MEQ tablet    Sig: Take 1 tablet (20 mEq total) by mouth daily.    Dispense:  30 tablet    Refill:  11    Patient Instructions  Medication Instructions:  Take Furosemide  40 mg daily Potassium 20 meq daily *If you need a refill on your cardiac medications before your next appointment, please call your pharmacy*  Lab Work: BMP, Mag, and BNP- Come back on May 21st to get these drawn If you have labs (blood work) drawn today and your tests are completely normal, you will receive your results only by: MyChart Message (if you have MyChart) OR A paper copy in the mail If you have any lab test that is abnormal or we need to change your treatment, we will call you to review the results.  Log BP and Weight daily  Follow-Up: At [facility], you and your health needs are our priority.  As part of our continuing mission to provide you with exceptional heart care, our providers are all part of one team.  This team includes your primary Cardiologist (physician) and Advanced Practice Providers or APPs (Physician Assistants and Nurse Practitioners) who all work together to provide you with the  care you need, when you need it.  Your next appointment:   Keep already scheduled appointments  We recommend signing up for the patient portal called "MyChart".  Sign up information is provided on this After Visit Summary.  MyChart is used to connect with patients for Virtual Visits  (Telemedicine).  Patients are able to view lab/test results, encounter notes, upcoming appointments, etc.  Non-urgent messages can be sent to your provider as well.   To learn more about what you can do with MyChart, go to [email or web address].         Signed, Luana Rumple, MD  12/07/2023 2:44 PM    [facility]

## 2023-12-06 NOTE — Patient Instructions (Signed)
 Description   Continue taking warfarin 1/2 tablet daily EXCEPT 1 tablet on Sundays, Tuesdays and Thursdays. Continue eating broccoli every other day.  Recheck INR 5 weeks.  Anticoagulation [phone].

## 2023-12-06 NOTE — Patient Instructions (Signed)
 Medication Instructions:  Take Furosemide 40 mg daily Potassium 20 meq daily *If you need a refill on your cardiac medications before your next appointment, please call your pharmacy*  Lab Work: BMP, Mag, and BNP- Come back on May 21st to get these drawn If you have labs (blood work) drawn today and your tests are completely normal, you will receive your results only by: MyChart Message (if you have MyChart) OR A paper copy in the mail If you have any lab test that is abnormal or we need to change your treatment, we will call you to review the results.  Log BP and Weight daily  Follow-Up: At [facility], you and your health needs are our priority.  As part of our continuing mission to provide you with exceptional heart care, our providers are all part of one team.  This team includes your primary Cardiologist (physician) and Advanced Practice Providers or APPs (Physician Assistants and Nurse Practitioners) who all work together to provide you with the care you need, when you need it.  Your next appointment:   Keep already scheduled appointments  We recommend signing up for the patient portal called "MyChart".  Sign up information is provided on this After Visit Summary.  MyChart is used to connect with patients for Virtual Visits (Telemedicine).  Patients are able to view lab/test results, encounter notes, upcoming appointments, etc.  Non-urgent messages can be sent to your provider as well.   To learn more about what you can do with MyChart, go to [email or web address].

## 2023-12-11 ENCOUNTER — Other Ambulatory Visit: Payer: Self-pay | Admitting: Internal Medicine

## 2023-12-11 ENCOUNTER — Inpatient Hospital Stay: Attending: Internal Medicine

## 2023-12-11 DIAGNOSIS — Z79620 Long term (current) use of immunosuppressive biologic: Secondary | ICD-10-CM | POA: Diagnosis not present

## 2023-12-11 DIAGNOSIS — C9000 Multiple myeloma not having achieved remission: Secondary | ICD-10-CM | POA: Insufficient documentation

## 2023-12-11 DIAGNOSIS — Z5112 Encounter for antineoplastic immunotherapy: Secondary | ICD-10-CM | POA: Insufficient documentation

## 2023-12-11 LAB — CMP (CANCER CENTER ONLY)
ALT: 42 U/L (ref 0–44)
AST: 27 U/L (ref 15–41)
Albumin: 3.9 g/dL (ref 3.5–5.0)
Alkaline Phosphatase: 80 U/L (ref 38–126)
Anion gap: 6 (ref 5–15)
BUN: 20 mg/dL (ref 8–23)
CO2: 27 mmol/L (ref 22–32)
Calcium: 9.4 mg/dL (ref 8.9–10.3)
Chloride: 105 mmol/L (ref 98–111)
Creatinine: 1.12 mg/dL (ref 0.61–1.24)
GFR, Estimated: >60 mL/min (ref >60)
Glucose, Bld: 166 mg/dL — ABNORMAL HIGH (ref 70–99)
Potassium: 3.8 mmol/L (ref 3.5–5.1)
Sodium: 138 mmol/L (ref 135–145)
Total Bilirubin: 1.5 mg/dL — ABNORMAL HIGH (ref 0.0–1.2)
Total Protein: 5.9 g/dL — ABNORMAL LOW (ref 6.5–8.1)

## 2023-12-11 LAB — CBC WITH DIFFERENTIAL (CANCER CENTER ONLY)
Abs Immature Granulocytes: 0.05 10*3/uL (ref 0.00–0.07)
Basophils Absolute: 0 10*3/uL (ref 0.0–0.1)
Basophils Relative: 0 %
Eosinophils Absolute: 0 10*3/uL (ref 0.0–0.5)
Eosinophils Relative: 0 %
HCT: 44.5 % (ref 39.0–52.0)
Hemoglobin: 15.2 g/dL (ref 13.0–17.0)
Immature Granulocytes: 1 %
Lymphocytes Relative: 1 %
Lymphs Abs: 0.1 10*3/uL — ABNORMAL LOW (ref 0.7–4.0)
MCH: 34 pg (ref 26.0–34.0)
MCHC: 34.2 g/dL (ref 30.0–36.0)
MCV: 99.6 fL (ref 80.0–100.0)
Monocytes Absolute: 0.1 10*3/uL (ref 0.1–1.0)
Monocytes Relative: 1 %
Neutro Abs: 9.9 10*3/uL — ABNORMAL HIGH (ref 1.7–7.7)
Neutrophils Relative %: 97 %
Platelet Count: 123 10*3/uL — ABNORMAL LOW (ref 150–400)
RBC: 4.47 MIL/uL (ref 4.22–5.81)
RDW: 16 % — ABNORMAL HIGH (ref 11.5–15.5)
WBC Count: 10.2 10*3/uL (ref 4.0–10.5)
nRBC: 0 % (ref 0.0–0.2)

## 2023-12-12 ENCOUNTER — Encounter: Payer: Self-pay | Admitting: Podiatry

## 2023-12-12 ENCOUNTER — Ambulatory Visit (INDEPENDENT_AMBULATORY_CARE_PROVIDER_SITE_OTHER): Admitting: Podiatry

## 2023-12-12 DIAGNOSIS — L84 Corns and callosities: Secondary | ICD-10-CM

## 2023-12-12 LAB — KAPPA/LAMBDA LIGHT CHAINS
Kappa free light chain: 3.1 mg/L — ABNORMAL LOW (ref 3.3–19.4)
Kappa, lambda light chain ratio: >2.07 — ABNORMAL HIGH (ref 0.26–1.65)
Lambda free light chains: <1.5 mg/L — ABNORMAL LOW (ref 5.7–26.3)

## 2023-12-12 LAB — IGG, IGA, IGM
IgA: <5 mg/dL — ABNORMAL LOW (ref 61–437)
IgG (Immunoglobin G), Serum: 201 mg/dL — ABNORMAL LOW (ref 603–1613)
IgM (Immunoglobulin M), Srm: 11 mg/dL — ABNORMAL LOW (ref 15–143)

## 2023-12-12 LAB — BETA 2 MICROGLOBULIN, SERUM: Beta-2 Microglobulin: 1.6 mg/L (ref 0.6–2.4)

## 2023-12-12 NOTE — Patient Instructions (Signed)
 Monitor for any signs/symptoms of infection. Call the office immediately if any occur or go directly to the emergency room. Call with any questions/concerns.

## 2023-12-13 NOTE — Progress Notes (Signed)
 Subjective:   Patient ID: Ronald Duran, male   DOB: 83 y.o.   MRN: [id number]   HPI Chief Complaint  Patient presents with   Callouses    RM#12 patient states no change in right foot callus experiencing burning pain in the callus area.     83 year old male presents the office today with above concerns.  He said the calluses causing a burning pain localized to this area and he gets tenderness.  He does not report any open lesions or any drainage.  No swelling or redness.  Does not report any fevers or chills.  No recent injuries or changes otherwise.  No other concerns.   Review of Systems  All other systems reviewed and are negative.     Objective:  Physical Exam  General: AAO x3, NAD-uses a walker  Dermatological: Thick hyperkeratotic lesion right foot submetatarsal 5 which looks like it is almost peeling off.  Upon debridement there is no amount of dried blood present the area is preulcerative.  There is no definitive skin breakdown identified otherwise.  There is no surrounding erythema, ascending cellulitis.  No fluctuation or crepitation.  No malodor.  Vascular: Dorsalis Pedis artery and Posterior Tibial artery pedal pulses are palpable bilateral with immedate capillary fill time. Chonic lower extremity edema   Neruologic: Grossly intact via light touch bilateral.   Musculoskeletal: Bunion deformity noted with second toe overlapping the hallux.     Assessment:   Preulcerative lesion right foot    Plan:   Today I sharply debrided the lesion without any complications.  Minimal bleeding occurred.  Cleaned the area.  Antibiotic ointment was applied followed by dressing.  I modified his insert to help offload.  If needed consider custom inserts.  Also he can use the gel metatarsal pad as needed.  Recommend holding off on that topical medication from [facility] for now and I will use antibiotic ointment during the day and you can use moisturizer at nighttime.  Monitor  for any signs or symptoms of infection or skin breakdown.  Return for 2-3 weeks for pre-ulcerative callus.  Charity Conch DPM

## 2023-12-14 ENCOUNTER — Encounter

## 2023-12-17 NOTE — Progress Notes (Unsigned)
 [facility] OFFICE PROGRESS NOTE  Ronald Coho, MD [address] [place] [address]  DIAGNOSIS:  1) Multiple myeloma, presented with plasmacytoma as well as other lytic lesions in the bone diagnosed in March 2024 2) right lower lobe pulmonary nodule suspicious for non-small cell lung cancer    PRIOR THERAPY: SBRT to the right lower lobe pulmonary nodule under the care of Dr. Lorri Duran.   CURRENT THERAPY: 1) Systemic chemotherapy with daratumumab  subcutaneously weekly in addition to Velcade  1.3 Mg/M2 on days 1, 4, 8, 11 every 3 weeks as well as Revlimid  25 mg p.o. daily for 14 days every 3 weeks and Decadron  20 mg p.o. weekly with chemotherapy.  First cycle expected on 12/05/2022.  Status post 12 cycles. Revlimid  has been on hold since June/May 2024. Velcade  being held or cycle #7 due to PN and diarrhea.  2) Zometa  monthly, first dose on 07/03/23    INTERVAL HISTORY: Ronald Duran 83 y.o. male returns to the clinic today for a follow-up visit accompanied by his wife.  The patient was last seen in the clinic by Dr. Marguerita Duran on 11/20/23.  The patient is currently on treatment with Darzalex  and Decadron . Velcade  and Revlimid  were discontinued due to side effects, cytopenias, and peripheral neuropathy.   The patient has a history of instability in his left hip for which he previously underwent physical therapy.  He resumed PT. His last day of PT is later this week on Friday.   He saw his cardiologist in the interval since being seen. They recommended lasix . He has lost some weight due to the fluid weight.  Initially, he experienced shortness of breath and wheezing, initially thought to be allergies but later attributed to fluid retention. He has clear leakage from the leg due to swelling, which is expected to resolve with Lasix .  He also saw his dermatologist for folliculitis on his next for which he is taking doxycycline.  He has a history of a lung nodule  treated with SBRT a year ago. There have been no recent scans, and he has not reported any respiratory symptoms. Regular surveillance with CT scans is recommended to monitor for any changes.  He has neuropathy, which he feels might be improving slightly, particularly in his hands and feet, affecting his balance. He feels he has made significant improvements in his balance and is confident in continuing exercises independently.  No fevers or significant infections since his last visit, although he experienced a mild sore throat and a stuffy nose recently, which resolved quickly. His appetite remains good, and he occasionally drinks Ensure. He acknowledges some weight loss, likely due to fluid loss from Lasix .  He denies any abnormal bleeding besides normal bruising with his blood thinner.  He is expected to contact the Coumadin  clinic today due to some drug to drug interactions with the doxycycline and warfarin. The patient  is here today for evaluation and repeat blood work.      MEDICAL HISTORY: Past Medical History:  Diagnosis Date   Atrial fibrillation (HCC)    CAD (coronary artery disease)    a. s/p MI with LAD stent placement December, 1999 -last stress test November 2008 with no EKG evidence of ischemia  b. cath 07/23/2015 after abnormal ETT and nuc, occluded mid LAD stent with good distal collateral, medical therapy    Cardiomyopathy (HCC)    Ischemic cardiomyopathy with EF 40% by echo 2005   COVID 2021   mild case  Dyslipidemia    Erectile dysfunction    GERD (gastroesophageal reflux disease)    Hiatal hernia    with esophageal strictures and dilatations in the past - Dr. Joshua Nieves   History of nuclear stress test    Myoview 12/16: EF 47%, anterior, anteroseptal and inferoseptal defect suggesting infarct with peri-infarct ischemia, intermediate risk   HTN (hypertension)    Hx of seasonal allergies    Hyperlipidemia    Myocardial infarction (HCC) 08/01/1998   PAD (peripheral  artery disease) (HCC)    Pre-diabetes    Prostate cancer (HCC)    with seed implant therapy following Lupron therapy in 2008 and 2009 - followed by Dr. Bosie Bye   Ringworm    Stroke (HCC) 04/20/2017   seen on a MRI   Vitamin D deficiency     ALLERGIES:  has no known allergies.  MEDICATIONS:  Current Outpatient Medications  Medication Sig Dispense Refill   acyclovir  (ZOVIRAX ) 200 MG capsule Take 1 capsule (200 mg total) by mouth 2 (two) times daily. 60 capsule 5   atorvastatin  (LIPITOR) 40 MG tablet Take 40 mg by mouth at bedtime.     B Complex Vitamins (B COMPLEX 100) tablet Take 1 tablet by mouth daily. Balance     cetirizine  (ZYRTEC  ALLERGY) 10 MG tablet Take 1 tablet (10 mg total) by mouth daily. 30 tablet 0   Cholecalciferol (VITAMIN D3) 2000 UNITS TABS Take 2,000 Units by mouth daily.     ciclopirox  (PENLAC ) 8 % solution Apply topically at bedtime. Apply over nail and surrounding skin. Apply daily over previous coat. After seven (7) days, may remove with alcohol and continue cycle. 6.6 mL 2   Coenzyme Q10 (CO Q-10) 100 MG CAPS Take 100 mg by mouth daily with supper.     cyanocobalamin (VITAMIN B12) 1000 MCG tablet Take 1,000 mcg by mouth daily.     dexamethasone  (DECADRON ) 4 MG tablet TAKE 5 tabS weekly, start with the first dose of chemotherapy. 40 tablet 3   furosemide  (LASIX ) 40 MG tablet Take 1 tablet (40 mg total) by mouth daily. 30 tablet 11   ketoconazole (NIZORAL) 2 % cream Apply 1 Application topically daily as needed for irritation.     metoprolol  tartrate (LOPRESSOR ) 25 MG tablet Take 0.5 tablets (12.5 mg total) by mouth 2 (two) times daily. 90 tablet 3   Multiple Vitamins-Minerals (CENTRUM) tablet Take 1 tablet by mouth daily.     nitroGLYCERIN  (NITROSTAT ) 0.4 MG SL tablet PLACE 1 TABLET UNDER THE TONGUE IF NEEDED EVERY 5 MINUTES FOR CHEST PAIN FOR 3 DOSES IF NO RELIEF AFTER FIRST DOSE CALL 911. 25 tablet 9   potassium chloride  SA (KLOR-CON  M) 20 MEQ tablet Take 1 tablet  (20 mEq total) by mouth daily. 30 tablet 11   warfarin (COUMADIN ) 5 MG tablet TAKE 1/2 TO 1 TABLET BY MOUTH AS DIRECTED by coumadin  clinic 30 tablet 5   No current facility-administered medications for this visit.    SURGICAL HISTORY:  Past Surgical History:  Procedure Laterality Date   BRONCHIAL BIOPSY  11/15/2022   Procedure: BRONCHIAL BIOPSIES;  Surgeon: Prudy Brownie, DO;  Location: [facility];  Service: Pulmonary;;   BRONCHIAL BRUSHINGS  11/15/2022   Procedure: BRONCHIAL BRUSHINGS;  Surgeon: Prudy Brownie, DO;  Location: [facility];  Service: Pulmonary;;   BRONCHIAL NEEDLE ASPIRATION BIOPSY  11/15/2022   Procedure: BRONCHIAL NEEDLE ASPIRATION BIOPSIES;  Surgeon: Prudy Brownie, DO;  Location: [facility];  Service: Pulmonary;;   CARDIAC CATHETERIZATION  N/A 07/23/2015   Procedure: Left Heart Cath and Coronary Angiography;  Surgeon: Arty Binning, MD;  Location: [facility];  Service: Cardiovascular;  Laterality: N/A;   CARDIOVERSION N/A 06/03/2022   Procedure: CARDIOVERSION;  Surgeon: Elmyra Haggard, MD;  Location: [facility] ENDOSCOPY;  Service: Cardiovascular;  Laterality: N/A;   COLONOSCOPY WITH PROPOFOL  N/A 11/24/2015   Procedure: COLONOSCOPY WITH PROPOFOL ;  Surgeon: Garrett Kallman, MD;  Location: [facility];  Service: Endoscopy;  Laterality: N/A;   esophageal strictures with dilatations in the past per Dr. Joshua Nieves     last '98   FIDUCIAL MARKER PLACEMENT  11/15/2022   Procedure: FIDUCIAL MARKER PLACEMENT;  Surgeon: Prudy Brownie, DO;  Location: [facility];  Service: Pulmonary;;   HERNIA REPAIR Left    INTRAMEDULLARY (IM) NAIL INTERTROCHANTERIC Left 10/25/2022   Procedure: LEFT INTRAMEDULLARY (IM) NAIL INTERTROCHANTERIC;  Surgeon: Wes Hamman, MD;  Location: [facility] OR;  Service: Orthopedics;  Laterality: Left;   IR BONE MARROW BIOPSY & ASPIRATION  11/17/2022   IR BONE TUMOR(S)RF ABLATION  10/11/2022   IR KYPHO THORACIC WITH BONE BIOPSY  10/11/2022   LAD stent placement -  Dr. Kay Parson     left inguinal hernia repair - Dr. Criselda Dolly     prostatic radioactive seed implantation - Dr. Ike Malady     '09    REVIEW OF SYSTEMS:   Constitutional: Positive for stable fatigue. Negative for appetite change, chills,  fever and unexpected weight change.  HENT: Negative for mouth sores, nosebleeds, sore throat and trouble swallowing.   Eyes: Negative for eye problems and icterus.  Respiratory: Negative for cough, hemoptysis, shortness of breath and wheezing.   Cardiovascular: Negative for chest pain. Positive for stable mild bilateral lower extremity pitting edema (L>R).  Gastrointestinal: Negative for abdominal pain, constipation, diarrhea, nausea and vomiting.  Genitourinary: Negative for bladder incontinence, difficulty urinating, dysuria, frequency and hematuria.   Musculoskeletal: Positive for occasional chronic back. Negative for gait problem, neck pain and neck stiffness.  Skin: Positive for folliculitis in left neck. Positive for dry skin.  Neurological: Negative for dizziness, extremity weakness, gait problem, headaches, light-headedness and seizures.  Hematological: Negative for adenopathy. Does not bleed easily. Positive for baseline bruising on upper extremities due to blood thinner use.  Psychiatric/Behavioral: Negative for confusion, depression and sleep disturbance. The patient is not nervous/anxious.   PHYSICAL EXAMINATION:  There were no vitals taken for this visit.  ECOG PERFORMANCE STATUS: 1  Physical Exam  Constitutional: Oriented to person, place, and time and thin appearing male, and in no distress.  HENT:  Head: Normocephalic and atraumatic.  Mouth/Throat: Oropharynx is clear and moist. No oropharyngeal exudate.  Eyes: Conjunctivae are normal. Right eye exhibits no discharge. Left eye exhibits no discharge. No scleral icterus.  Neck: Normal range of motion. Neck supple.  Cardiovascular: Normal rate, regular rhythm, normal heart sounds and  intact distal pulses.   Pulmonary/Chest: Effort normal and breath sounds normal. No respiratory distress. No wheezes. No rales.  Abdominal: Soft. Bowel sounds are normal. Exhibits no distension and no mass. There is no tenderness.  Musculoskeletal: Normal range of motion. Mild-moderate left leg pitting edema.   Lymphadenopathy:    No cervical adenopathy.  Neurological: Positive for tremor. Alert and oriented to person, place, and time. Exhibits normal muscle tone. Gait normal. Coordination normal.  Skin: Positive for few scattered skin lesions.  Positive for dry skin on lower extremity.  Positive for bruising.  Skin: Skin is warm and dry.  No rash noted. Not diaphoretic. No erythema. No pallor.  Psychiatric: Mood, memory and judgment normal.  Vitals reviewed.  LABORATORY DATA: Lab Results  Component Value Date   WBC 10.2 12/11/2023   HGB 15.2 12/11/2023   HCT 44.5 12/11/2023   MCV 99.6 12/11/2023   PLT 123 (L) 12/11/2023      Chemistry      Component Value Date/Time   NA 138 12/11/2023 1502   NA 141 08/25/2022 1440   K 3.8 12/11/2023 1502   CL 105 12/11/2023 1502   CO2 27 12/11/2023 1502   BUN 20 12/11/2023 1502   BUN 14 08/25/2022 1440   CREATININE 1.12 12/11/2023 1502   CREATININE 1.23 (H) 07/21/2015 0917      Component Value Date/Time   CALCIUM  9.4 12/11/2023 1502   ALKPHOS 80 12/11/2023 1502   AST 27 12/11/2023 1502   ALT 42 12/11/2023 1502   BILITOT 1.5 (H) 12/11/2023 1502       RADIOGRAPHIC STUDIES:  No results found.   ASSESSMENT/PLAN:  This is a very pleasant 83 year old Caucasian male with multiple myeloma presented with multiple lytic lesions involving T10, left femoral neck as well as T4, T9 and T11 vertebral bodies.    He also had right lower lobe lung nodule that was suspicious for non-small cell lung cancer.    The biopsy performed by interventional radiology from the T10 lesion was consistent with plasmacytoma which is part of the whole picture of  multiple myeloma.    he biopsy from the left hip was negative for malignancy. His myeloma panel showed no concerning findings except for elevated IgG but the serum light chains were normal. The patient underwent left hip fixation for impending fracture. He also underwent palliative radiotherapy under the care of Dr. Lorri Duran. His recent bone marrow biopsy and aspirate was consistent with plasma cell neoplasm with involvement of plasma cells 25% of the bone marrow.   The patient is currently on treatment with daratumumab , Velcade , Revlimid  and dexamethasone  started on 12/05/2022 status post 15 cycle. The patient has had treatment held periodically due to thrombocytopenia.  His dose of Revlimid  was reduced to 15 milligrams due to thrombocytopenia.  However, his Revlimid  was ultimately held starting from May/June 2024.   Velcade  was discontinued as the patient was recovering from diarrhea, hypotension, and dizziness.  He also was developing peripheral neuropathy.   Patient was seen with Dr. Marguerita Duran today.  Dr. Marguerita Duran reviewed his recent myeloma labs.  These appear stable.  Dr. Marguerita Duran recommends he continue on the same treatment at this time.  Labs were reviewed.  He is okay to treat this bilirubin of 1.9.  Recommend he continue with dara every 4 weeks as scheduled today. He will proceed with treatment today as scheduled.   Will continue to receive Zometa  monthly.   We will see him back for a follow-up visit in 4 weeks before his next cycle of treatment.  Folliculitis Recurrent folliculitis on neck treated with doxycycline. Initial improvement noted. - Continue doxycycline hyclate for 10 days. - Monitor for recurrence and reinitiate treatment if necessary.   Lung cancer Previously treated with SBRT. No symptoms of recurrence. Surveillance CT scans essential. - Order surveillance CT scan in three weeks before next appointment.   Edema Edema with leakage likely due to fluid retention.  Cardiologist recommended Lasix  and weight loss. - Continue Lasix  as prescribed. - Monitor weight daily.  Easy bruising Easy bruising likely related to warfarin. Antibiotic interaction requires dose adjustment. -  Contact Coumadin  clinic to adjust warfarin dose.  Peripheral neuropathy Peripheral neuropathy with slight improvement. Physical therapy beneficial for balance. - Complete current physical therapy sessions. - Consider resuming therapy if symptoms persist or worsen   The patient was advised to call immediately if she has any concerning symptoms in the interval. The patient voices understanding of current disease status and treatment options and is in agreement with the current care plan. All questions were answered. The patient knows to call the clinic with any problems, questions or concerns. We can certainly see the patient much sooner if necessary   No orders of the defined types were placed in this encounter.    Willene Holian L Della Homan, PA-C 12/17/23  ADDENDUM: Hematology/Oncology Attending:  The patient is seen and examined today.  I reviewed his records, lab and recommended his care plan.  This is a very pleasant 83 years old white male with history of multiple myeloma diagnosed in March 2024 in addition to right lower lobe pulmonary nodule suspicious for non-small cell lung cancer treated with SBRT.  The patient is started systemic therapy for the multiple myeloma initially with daratumumab , subcutaneous Velcade , Revlimid  and Decadron  but his treatment with Velcade  and Revlimid  were discontinued secondary to intolerance including peripheral neuropathy and diarrhea.  He continues his treatment with subcutaneous daratumumab  and Decadron  on every 28-day cycle.  He is status post 12 cycles and has been tolerating this treatment much better.  He is also on Zometa  for the bone disease.  The patient had repeat myeloma panel performed recently.  I discussed the lab result with the  patient and there is no concerning findings for disease progression.  I recommended for him to continue his current treatment with subcutaneous daratumumab  and Decadron  and he will proceed with cycle #13 today. Regarding the lung nodule that was treated with SBRT, we will repeat CT scan of the chest in 1 months for evaluation and close monitoring. The patient was advised to call immediately if he has any other concerning symptoms in the interval. The total time spent in the appointment was 30 minutes including review of chart and various tests results, discussions about plan of care and coordination of care plan . Disclaimer: This note was dictated with voice recognition software. Similar sounding words can inadvertently be transcribed and may be missed upon review. Aurelio Blower, MD

## 2023-12-18 ENCOUNTER — Inpatient Hospital Stay

## 2023-12-18 ENCOUNTER — Other Ambulatory Visit: Payer: Self-pay | Admitting: Internal Medicine

## 2023-12-18 ENCOUNTER — Inpatient Hospital Stay (HOSPITAL_BASED_OUTPATIENT_CLINIC_OR_DEPARTMENT_OTHER): Admitting: Physician Assistant

## 2023-12-18 VITALS — BP 141/90 | HR 84 | Temp 97.2°F | Resp 15 | Wt 180.9 lb

## 2023-12-18 VITALS — BP 114/81 | HR 73 | Resp 16

## 2023-12-18 DIAGNOSIS — C3491 Malignant neoplasm of unspecified part of right bronchus or lung: Secondary | ICD-10-CM | POA: Diagnosis not present

## 2023-12-18 DIAGNOSIS — C9000 Multiple myeloma not having achieved remission: Secondary | ICD-10-CM

## 2023-12-18 DIAGNOSIS — C799 Secondary malignant neoplasm of unspecified site: Secondary | ICD-10-CM | POA: Diagnosis not present

## 2023-12-18 DIAGNOSIS — C419 Malignant neoplasm of bone and articular cartilage, unspecified: Secondary | ICD-10-CM

## 2023-12-18 DIAGNOSIS — Z5112 Encounter for antineoplastic immunotherapy: Secondary | ICD-10-CM | POA: Diagnosis not present

## 2023-12-18 LAB — CMP (CANCER CENTER ONLY)
ALT: 32 U/L (ref 0–44)
AST: 22 U/L (ref 15–41)
Albumin: 3.9 g/dL (ref 3.5–5.0)
Alkaline Phosphatase: 85 U/L (ref 38–126)
Anion gap: 5 (ref 5–15)
BUN: 17 mg/dL (ref 8–23)
CO2: 28 mmol/L (ref 22–32)
Calcium: 9.4 mg/dL (ref 8.9–10.3)
Chloride: 106 mmol/L (ref 98–111)
Creatinine: 1.15 mg/dL (ref 0.61–1.24)
GFR, Estimated: >60 mL/min (ref >60)
Glucose, Bld: 98 mg/dL (ref 70–99)
Potassium: 3.9 mmol/L (ref 3.5–5.1)
Sodium: 139 mmol/L (ref 135–145)
Total Bilirubin: 1.9 mg/dL — ABNORMAL HIGH (ref 0.0–1.2)
Total Protein: 5.9 g/dL — ABNORMAL LOW (ref 6.5–8.1)

## 2023-12-18 LAB — CBC WITH DIFFERENTIAL (CANCER CENTER ONLY)
Abs Immature Granulocytes: 0.05 10*3/uL (ref 0.00–0.07)
Basophils Absolute: 0.1 10*3/uL (ref 0.0–0.1)
Basophils Relative: 1 %
Eosinophils Absolute: 0.1 10*3/uL (ref 0.0–0.5)
Eosinophils Relative: 1 %
HCT: 46.1 % (ref 39.0–52.0)
Hemoglobin: 15.5 g/dL (ref 13.0–17.0)
Immature Granulocytes: 0 %
Lymphocytes Relative: 3 %
Lymphs Abs: 0.4 10*3/uL — ABNORMAL LOW (ref 0.7–4.0)
MCH: 33.8 pg (ref 26.0–34.0)
MCHC: 33.6 g/dL (ref 30.0–36.0)
MCV: 100.4 fL — ABNORMAL HIGH (ref 80.0–100.0)
Monocytes Absolute: 0.6 10*3/uL (ref 0.1–1.0)
Monocytes Relative: 5 %
Neutro Abs: 11 10*3/uL — ABNORMAL HIGH (ref 1.7–7.7)
Neutrophils Relative %: 90 %
Platelet Count: 117 10*3/uL — ABNORMAL LOW (ref 150–400)
RBC: 4.59 MIL/uL (ref 4.22–5.81)
RDW: 15.6 % — ABNORMAL HIGH (ref 11.5–15.5)
WBC Count: 12.2 10*3/uL — ABNORMAL HIGH (ref 4.0–10.5)
nRBC: 0 % (ref 0.0–0.2)

## 2023-12-18 MED ORDER — SODIUM CHLORIDE 0.9 % IV SOLN: Freq: Once | INTRAVENOUS | Status: AC

## 2023-12-18 MED ORDER — DIPHENHYDRAMINE HCL 25 MG PO CAPS
50.0000 mg | ORAL_CAPSULE | Freq: Once | ORAL | Status: AC
  Administered 2023-12-18: 50 mg via ORAL
  Filled 2023-12-18: qty 2

## 2023-12-18 MED ORDER — ACETAMINOPHEN 325 MG PO TABS
650.0000 mg | ORAL_TABLET | Freq: Once | ORAL | Status: AC
  Administered 2023-12-18: 650 mg via ORAL
  Filled 2023-12-18: qty 2

## 2023-12-18 MED ORDER — DARATUMUMAB-HYALURONIDASE-FIHJ 1800-30000 MG-UT/15ML ~~LOC~~ SOLN
1800.0000 mg | Freq: Once | SUBCUTANEOUS | Status: AC
  Administered 2023-12-18: 1800 mg via SUBCUTANEOUS
  Filled 2023-12-18: qty 15

## 2023-12-18 MED ORDER — ZOLEDRONIC ACID 4 MG/100ML IV SOLN
4.0000 mg | Freq: Once | INTRAVENOUS | Status: AC
  Administered 2023-12-18: 4 mg via INTRAVENOUS
  Filled 2023-12-18: qty 100

## 2023-12-18 NOTE — Progress Notes (Signed)
 Per Cassie Heilingoetter, PA-C - okay to proceed with treatment with bilirubin of 1.9.   Patient reports taking decadron  at home around 7:45 AM prior to infusion appointment.  No planned dental procedures.

## 2023-12-18 NOTE — Patient Instructions (Signed)
 [facility] - A DEPT OF [facility]   Discharge Instructions: Thank you for choosing [facility] to provide your oncology and hematology care.   If you have a lab appointment with the Cancer Center, please go directly to the Cancer Center and check in at the registration area.   Wear comfortable clothing and clothing appropriate for easy access to any Portacath or PICC line.   We strive to give you quality time with your provider. You may need to reschedule your appointment if you arrive late (15 or more minutes).  Arriving late affects you and other patients whose appointments are after yours.  Also, if you miss three or more appointments without notifying the office, you may be dismissed from the clinic at the provider's discretion.      For prescription refill requests, have your pharmacy contact our office and allow 72 hours for refills to be completed.    Today you received the following chemotherapy and/or immunotherapy agents: Daratumumab hyaluronidase (Darzalex faspro)      To help prevent nausea and vomiting after your treatment, we encourage you to take your nausea medication as directed.  BELOW ARE SYMPTOMS THAT SHOULD BE REPORTED IMMEDIATELY: *FEVER GREATER THAN 100.4 F (38 C) OR HIGHER *CHILLS OR SWEATING *NAUSEA AND VOMITING THAT IS NOT CONTROLLED WITH YOUR NAUSEA MEDICATION *UNUSUAL SHORTNESS OF BREATH *UNUSUAL BRUISING OR BLEEDING *URINARY PROBLEMS (pain or burning when urinating, or frequent urination) *BOWEL PROBLEMS (unusual diarrhea, constipation, pain near the anus) TENDERNESS IN MOUTH AND THROAT WITH OR WITHOUT PRESENCE OF ULCERS (sore throat, sores in mouth, or a toothache) UNUSUAL RASH, SWELLING OR PAIN  UNUSUAL VAGINAL DISCHARGE OR ITCHING   Items with * indicate a potential emergency and should be followed up as soon as possible or go to the Emergency Department if any problems should occur.  Please show the  CHEMOTHERAPY ALERT CARD or IMMUNOTHERAPY ALERT CARD at check-in to the Emergency Department and triage nurse.  Should you have questions after your visit or need to cancel or reschedule your appointment, please contact [facility] - A DEPT OF [facility]  Dept: [phone]  and follow the prompts.  Office hours are 8:00 a.m. to 4:30 p.m. Monday - Friday. Please note that voicemails left after 4:00 p.m. may not be returned until the following business day.  We are closed weekends and major holidays. You have access to a nurse at all times for urgent questions. Please call the main number to the clinic Dept: [phone] and follow the prompts.   For any non-urgent questions, you may also contact your provider using MyChart. We now offer e-Visits for anyone 60 and older to request care online for non-urgent symptoms. For details visit [email or web address].   Also download the MyChart app! Go to the app store, search "MyChart", open the app, select [facility], and log in with your MyChart username and password.

## 2023-12-19 ENCOUNTER — Other Ambulatory Visit: Payer: Self-pay

## 2023-12-21 ENCOUNTER — Ambulatory Visit: Attending: Cardiovascular Disease

## 2023-12-21 DIAGNOSIS — I4819 Other persistent atrial fibrillation: Secondary | ICD-10-CM

## 2023-12-21 DIAGNOSIS — Z79899 Other long term (current) drug therapy: Secondary | ICD-10-CM

## 2023-12-21 DIAGNOSIS — I6349 Cerebral infarction due to embolism of other cerebral artery: Secondary | ICD-10-CM | POA: Diagnosis not present

## 2023-12-21 LAB — POCT INR: INR: 1.7 — AB (ref 2.0–3.0)

## 2023-12-21 NOTE — Patient Instructions (Signed)
 Description   Resume taking warfarin 1/2 tablet daily EXCEPT 1 tablet on Sundays, Tuesdays and Thursdays.  Continue eating broccoli every other day.  Recheck INR 1 week  Anticoagulation Clinic-[phone].

## 2023-12-22 NOTE — Progress Notes (Signed)
 CARDIOLOGY CONSULT NOTE       Patient ID: Ronald Duran MRN: [id number] DOB/AGE: 81-Jul-1942 81 y.o.  Admit date: (Not on file) Referring Physician: Croitoru Primary Physician: Ronald Coho, MD Primary Cardiologist: Ronald Duran Reason for Consultation: CHF  Active Problems:   * No active hospital problems. *   HPI:  83 y.o. previously followed by Ronald Duran. I saw him in hospital for consult 01/26/23.  Hx of CAD, HLD, NSCLC s/p radiation therapy, chronic systolic heart failure with hypotension, PAF on OAC with coumadin  . MI with stent placement in the LAD 07/1998 with ICM and EF 40% on echo 2004/01/20. Heart cath 07/2015 after abnormal nuclear stress test showed total occlusion in the LAD at the site of the previously placed stent with well-developed collaterals form dominant right. EF 45-50% with apical akinesis. Follow up studies with LVEF 45%, last echo 01/19/22 with LVEF 45-50%.     Afib incidentally found in Sept 2023 by PCP. DCCV on 06/03/22 with early return to Afib. Given history of cardiomyopathy, he was referred to EP for consideration of ablation. CT chest detected multiple pulmonary nodules suspicious for possible malignancy and ablation was canceled. Biopsy of lytic bone lesion revealed NSCLC. He has received palliative radiotherapy. Chemotherapy stopped due to thrombocytopenia. 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 Heme/onc felt it was safe to continue OAC. He was on coumadin  Not considered a failure with fluctuating INR and recent lovenox  bridge for hip procedure  Seen by Ronald Duran May 2024 with rate controlled afib  Seen by Ronald Duran 12/06/23 with volume overload.   TTE 01/25/23 EF 35-40% ? Small mural apical thrombus INR Rx over last 12 months.  BNP 12/28/23 in 490.8 with normal Cr/K  Rx with lasix  40 mg daily and lopressor  12.5 mg bid BP has been borderline and not on ARB/ARNI.   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 Feels better with lasix  Has always had LLE edema worse due to lymphedema and venous dx. Discussed change to DOAC. Discussed taking extra lasix  on Mondays when he takes decadron . Discussed increasing lopressor  for better rate control   ROS All other systems reviewed and negative except as noted above  Past Medical History:  Diagnosis Date   Atrial fibrillation (HCC)    CAD (coronary artery disease)    a. s/p MI with LAD stent placement December, 1999 -last stress test November 2008 with no EKG evidence of ischemia  b. cath 07/23/2015 after abnormal ETT and nuc, occluded mid LAD stent with good distal collateral, medical therapy    Cardiomyopathy (HCC)    Ischemic cardiomyopathy with EF 40% by echo Jan 20, 2004   COVID 01-20-20   mild case   Dyslipidemia    Erectile dysfunction    GERD (gastroesophageal reflux disease)    Hiatal hernia    with esophageal strictures and dilatations in the past - Ronald. Joshua Duran   History of nuclear stress test    Myoview 12/16: EF 47%, anterior, anteroseptal and inferoseptal defect suggesting infarct with peri-infarct ischemia, intermediate risk   HTN (hypertension)    Hx of seasonal allergies  Hyperlipidemia    Myocardial infarction (HCC) 08/01/1998   PAD (peripheral artery disease) (HCC)    Pre-diabetes    Prostate cancer (HCC)    with seed implant therapy following Lupron therapy in 2008 and 2009 - followed by Ronald Duran   Ringworm    Stroke [facility]) 04/20/2017   seen on a MRI   Vitamin D deficiency     Family History  Problem Relation Age of Onset   CVA Mother    Diabetes Father    Heart disease Father    CVA Father 7   CAD Sister     Diabetes Sister     Social History   Socioeconomic History   Marital status: Married    Spouse name: ,Ronald Duran   Number of children: 4   Years of education: Not on file   Highest education level: Not on file  Occupational History   Not on file  Tobacco Use   Smoking status: Never    Passive exposure: Past   Smokeless tobacco: Never  Vaping Use   Vaping status: Never Used  Substance and Sexual Activity   Alcohol use: Not Currently   Drug use: No   Sexual activity: Not Currently  Other Topics Concern   Not on file  Social History Narrative   Not on file   Social Drivers of Health   Financial Resource Strain: Not on file  Food Insecurity: No Food Insecurity (04/02/2023)   Hunger Vital Sign    Worried About Running Out of Food in the Last Year: Never true    Ran Out of Food in the Last Year: Never true  Transportation Needs: No Transportation Needs (04/02/2023)   PRAPARE - Administrator, Civil Service (Medical): No    Lack of Transportation (Non-Medical): No  Recent Concern: Transportation Needs - Unmet Transportation Needs (01/24/2023)   PRAPARE - Administrator, Civil Service (Medical): Yes    Lack of Transportation (Non-Medical): No  Physical Activity: Not on file  Stress: Not on file  Social Connections: Not on file  Intimate Partner Violence: Not At Risk (04/02/2023)   Humiliation, Afraid, Rape, and Kick questionnaire    Fear of Current or Ex-Partner: No    Emotionally Abused: No    Physically Abused: No    Sexually Abused: No    Past Surgical History:  Procedure Laterality Date   BRONCHIAL BIOPSY  11/15/2022   Procedure: BRONCHIAL BIOPSIES;  Surgeon: Ronald Brownie, DO;  Location: [facility] ENDOSCOPY;  Service: Pulmonary;;   BRONCHIAL BRUSHINGS  11/15/2022   Procedure: BRONCHIAL BRUSHINGS;  Surgeon: Ronald Brownie, DO;  Location: [facility]OSCOPY;  Service: Pulmonary;;   BRONCHIAL NEEDLE ASPIRATION BIOPSY  11/15/2022   Procedure: BRONCHIAL NEEDLE  ASPIRATION BIOPSIES;  Surgeon: Ronald Brownie, DO;  Location: [facility] ENDOS[facility];  Service: Pulmonary;;   CARDIAC CATHETERIZATION N/A 07/23/2015   Procedure: Left Heart Cath and Coronary Angiography;  Surgeon: Ronald Binning, MD;  Location: [facility] INVASIVE CV LAB;  Service: Cardiovascular;  Laterality: N/A;   CARDIOVERSION N/A 06/03/2022   Procedure: CARDIOVERSION;  Surgeon: Elmyra Haggard, MD;  Location: [facility] ENDOSCOPY;  Service: Cardiovascular;  Laterality: N/A;   COLONOSCOPY WITH PROPOFOL  N/A 11/24/2015   Procedure: COLONOSCOPY WITH PROPOFOL ;  Surgeon: Garrett Kallman, MD;  Location: [facility] ENDOSCOPY;  Service: Endoscopy;  Laterality: N/A;   esophageal strictures with dilatations in the past per Ronald. Joshua Duran     last '98   FIDUCIAL MARKER PLACEMENT  11/15/2022  Procedure: FIDUCIAL MARKER PLACEMENT;  Surgeon: Ronald Brownie, DO;  Location: [facility] ENDOSCOPY;  Service: Pulmonary;;   HERNIA REPAIR Left    INTRAMEDULLARY (IM) NAIL INTERTROCHANTERIC Left 10/25/2022   Procedure: LEFT INTRAMEDULLARY (IM) NAIL INTERTROCHANTERIC;  Surgeon: Wes Hamman, MD;  Location: [facility] OR;  Service: Orthopedics;  Laterality: Left;   IR BONE MARROW BIOPSY & ASPIRATION  11/17/2022   IR BONE TUMOR(S)RF ABLATION  10/11/2022   IR KYPHO THORACIC WITH BONE BIOPSY  10/11/2022   LAD stent placement - Ronald. Kay Parson     left inguinal hernia repair - Ronald. Criselda Dolly     prostatic radioactive seed implantation - Ronald. Ike Malady     '09      Current Outpatient Medications:    acyclovir  (ZOVIRAX ) 200 MG capsule, Take 1 capsule (200 mg total) by mouth 2 (two) times daily., Disp: 60 capsule, Rfl: 5   atorvastatin  (LIPITOR) 40 MG tablet, Take 40 mg by mouth at bedtime., Disp: , Rfl:    B Complex Vitamins (B COMPLEX 100) tablet, Take 1 tablet by mouth daily. Balance, Disp: , Rfl:    cetirizine  (ZYRTEC  ALLERGY) 10 MG tablet, Take 1 tablet (10 mg total) by mouth daily., Disp: 30 tablet, Rfl: 0   Cholecalciferol (VITAMIN D3) 2000 UNITS TABS, Take 2,000  Units by mouth daily., Disp: , Rfl:    ciclopirox  (PENLAC ) 8 % solution, Apply topically at bedtime. Apply over nail and surrounding skin. Apply daily over previous coat. After seven (7) days, may remove with alcohol and continue cycle., Disp: 6.6 mL, Rfl: 2   Coenzyme Q10 (CO Q-10) 100 MG CAPS, Take 100 mg by mouth daily with supper., Disp: , Rfl:    cyanocobalamin (VITAMIN B12) 1000 MCG tablet, Take 2,500 mcg by mouth daily., Disp: , Rfl:    dexamethasone  (DECADRON ) 4 MG tablet, TAKE 5 tabS weekly, start with the first dose of chemotherapy., Disp: 40 tablet, Rfl: 3   doxycycline (ADOXA) 100 MG tablet, Take 100 mg by mouth daily., Disp: , Rfl:    fluticasone (CUTIVATE) 0.05 % cream, Apply 1 Application topically 2 (two) times daily. As needed, Disp: , Rfl:    furosemide  (LASIX ) 40 MG tablet, Take 1 tablet (40 mg total) by mouth daily., Disp: 30 tablet, Rfl: 11   ketoconazole (NIZORAL) 2 % cream, Apply 1 Application topically daily as needed for irritation., Disp: , Rfl:    metoprolol  tartrate (LOPRESSOR ) 25 MG tablet, Take 0.5 tablets (12.5 mg total) by mouth 2 (two) times daily., Disp: 90 tablet, Rfl: 3   Multiple Vitamins-Minerals (CENTRUM) tablet, Take 1 tablet by mouth daily., Disp: , Rfl:    nitroGLYCERIN  (NITROSTAT ) 0.4 MG SL tablet, PLACE 1 TABLET UNDER THE TONGUE IF NEEDED EVERY 5 MINUTES FOR CHEST PAIN FOR 3 DOSES IF NO RELIEF AFTER FIRST DOSE CALL 911., Disp: 25 tablet, Rfl: 9   potassium chloride  SA (KLOR-CON  M) 20 MEQ tablet, Take 1 tablet (20 mEq total) by mouth daily., Disp: 30 tablet, Rfl: 11   warfarin (COUMADIN ) 5 MG tablet, TAKE 1/2 TO 1 TABLET BY MOUTH AS DIRECTED by coumadin  clinic, Disp: 30 tablet, Rfl: 5    Physical Exam: There were no vitals taken for this visit.    Elderly male Lungs clear No murmur  Abdomen benign LE edema bilateral plus one   Labs:   Lab Results  Component Value Date   WBC 12.2 (H) 12/18/2023   HGB 15.5 12/18/2023   HCT 46.1 12/18/2023    MCV 100.4 (  H) 12/18/2023   PLT 117 (L) 12/18/2023    Recent Labs  Lab 12/18/23 0930  NA 139  K 3.9  CL 106  CO2 28  BUN 17  CREATININE 1.15  CALCIUM  9.4  PROT 5.9*  BILITOT 1.9*  ALKPHOS 85  ALT 32  AST 22  GLUCOSE 98   Radiology: No results found.  EKG: afib rate 95 poor R wave progression 12/06/23    ASSESSMENT AND PLAN:   CHF:  in setting of active chemo for myeloma Ischemic DCM with LAD infarct and apical mural thrombus. EF 35-40% by TTE 01/25/23 Update TTE Lasix  increased by Ronald C 12/06/23 40 mg daily Check BNP/BMET. GDMT limited by low BP/age   Afib: chronic rate control with lopressor   increase dose to 25 mg bid. Patient will let us  know if he wants to change to eliquis.  Myeloma:  active Rx with 25% plasma cells in bone marrow and lytic bone lesions F/U Ronald Sofie Dunning NSCLC:  post XRT oncology has ordered f/u chest CT HLD:  on statin labs with primary  TTE Increase lopressor  25 bid Extra lasix  on Decadron  days  F/U 3 months   Signed: Janelle Mediate 12/22/2023, 4:13 PM

## 2023-12-28 ENCOUNTER — Encounter: Payer: Self-pay | Admitting: Internal Medicine

## 2023-12-28 ENCOUNTER — Ambulatory Visit: Payer: Self-pay | Admitting: Cardiovascular Disease

## 2023-12-28 LAB — BASIC METABOLIC PANEL WITH GFR
BUN/Creatinine Ratio: 17 (ref 10–24)
BUN: 20 mg/dL (ref 8–27)
CO2: 20 mmol/L (ref 20–29)
Calcium: 9.5 mg/dL (ref 8.6–10.2)
Chloride: 106 mmol/L (ref 96–106)
Creatinine, Ser: 1.15 mg/dL (ref 0.76–1.27)
Glucose: 90 mg/dL (ref 70–99)
Potassium: 3.9 mmol/L (ref 3.5–5.2)
Sodium: 143 mmol/L (ref 134–144)
eGFR: 64 mL/min/{1.73_m2} (ref >59)

## 2023-12-28 LAB — BRAIN NATRIURETIC PEPTIDE: BNP: 490.8 pg/mL — ABNORMAL HIGH (ref 0.0–100.0)

## 2023-12-28 LAB — MAGNESIUM: Magnesium: 2.7 mg/dL — ABNORMAL HIGH (ref 1.6–2.3)

## 2023-12-29 ENCOUNTER — Ambulatory Visit: Payer: Medicare Other | Attending: Cardiovascular Disease | Admitting: Cardiovascular Disease

## 2023-12-29 ENCOUNTER — Ambulatory Visit (INDEPENDENT_AMBULATORY_CARE_PROVIDER_SITE_OTHER): Admitting: *Deleted

## 2023-12-29 VITALS — BP 110/62 | HR 104 | Ht 72.0 in | Wt 179.0 lb

## 2023-12-29 DIAGNOSIS — Z7901 Long term (current) use of anticoagulants: Secondary | ICD-10-CM

## 2023-12-29 DIAGNOSIS — Z79899 Other long term (current) drug therapy: Secondary | ICD-10-CM | POA: Diagnosis not present

## 2023-12-29 DIAGNOSIS — I4819 Other persistent atrial fibrillation: Secondary | ICD-10-CM

## 2023-12-29 DIAGNOSIS — I6349 Cerebral infarction due to embolism of other cerebral artery: Secondary | ICD-10-CM | POA: Diagnosis not present

## 2023-12-29 DIAGNOSIS — E782 Mixed hyperlipidemia: Secondary | ICD-10-CM

## 2023-12-29 DIAGNOSIS — I5022 Chronic systolic (congestive) heart failure: Secondary | ICD-10-CM

## 2023-12-29 LAB — POCT INR: INR: 2.5 (ref 2.0–3.0)

## 2023-12-29 MED ORDER — METOPROLOL TARTRATE 25 MG PO TABS: 25.0000 mg | ORAL_TABLET | Freq: Two times a day (BID) | ORAL | 3 refills | Status: DC

## 2023-12-29 MED ORDER — FUROSEMIDE 40 MG PO TABS: 40.0000 mg | ORAL_TABLET | Freq: Two times a day (BID) | ORAL | 11 refills | Status: DC

## 2023-12-29 NOTE — Patient Instructions (Signed)
Description   Continue taking warfarin 1/2 tablet daily EXCEPT 1 tablet on Sundays, Tuesdays and Thursdays. Continue eating broccoli every other day.  Recheck INR 4 weeks.  Anticoagulation Clinic-[phone].

## 2023-12-29 NOTE — Patient Instructions (Addendum)
 Medication Instructions:  Your physician has recommended you make the following change in your medication:  1- Increase Metoprolol  25 mg by mouth twice daily. 2- Increase Furosemide  40 mg by mouth twice daily.  *If you need a refill on your cardiac medications before your next appointment, please call your pharmacy*  Lab Work: If you have labs (blood work) drawn today and your tests are completely normal, you will receive your results only by: MyChart Message (if you have MyChart) OR A paper copy in the mail If you have any lab test that is abnormal or we need to change your treatment, we will call you to review the results.  Testing/Procedures: Your physician has requested that you have an echocardiogram. Echocardiography is a painless test that uses sound waves to create images of your heart. It provides your doctor with information about the size and shape of your heart and how well your heart's chambers and valves are working. This procedure takes approximately one hour. There are no restrictions for this procedure. Please do NOT wear cologne, perfume, aftershave, or lotions (deodorant is allowed). Please arrive 15 minutes prior to your appointment time.  Please note: We ask at that you not bring children with you during ultrasound (echo/ vascular) testing. Due to room size and safety concerns, children are not allowed in the ultrasound rooms during exams. Our front office staff cannot provide observation of children in our lobby area while testing is being conducted. An adult accompanying a patient to their appointment will only be allowed in the ultrasound room at the discretion of the ultrasound technician under special circumstances. We apologize for any inconvenience. Follow-Up: At [facility], you and your health needs are our priority.  As part of our continuing mission to provide you with exceptional heart care, our providers are all part of one team.  This team includes  your primary Cardiologist (physician) and Advanced Practice Providers or APPs (Physician Assistants and Nurse Practitioners) who all work together to provide you with the care you need, when you need it.  Your next appointment:   3 month(s)  Provider:   Janelle Mediate, MD    We recommend signing up for the patient portal called "MyChart".  Sign up information is provided on this After Visit Summary.  MyChart is used to connect with patients for Virtual Visits (Telemedicine).  Patients are able to view lab/test results, encounter notes, upcoming appointments, etc.  Non-urgent messages can be sent to your provider as well.   To learn more about what you can do with MyChart, go to [email or web address].

## 2024-01-02 ENCOUNTER — Ambulatory Visit (INDEPENDENT_AMBULATORY_CARE_PROVIDER_SITE_OTHER): Admitting: Podiatry

## 2024-01-02 DIAGNOSIS — M216X1 Other acquired deformities of right foot: Secondary | ICD-10-CM | POA: Diagnosis not present

## 2024-01-02 DIAGNOSIS — L84 Corns and callosities: Secondary | ICD-10-CM

## 2024-01-03 NOTE — Progress Notes (Signed)
 Subjective:   Patient ID: Ronald Duran, male   DOB: 83 y.o.   MRN: [id number]   HPI Chief Complaint  Patient presents with   Callouses    [facility] 12 Patient is here for callus on right foot.Pt is interested in orthotics.     83 year old male presents the office today with above concerns.  He states the foot is doing better.  He states the offloading pad applied to his shoe was very helpful.  He is asking about this another shoe that he brought in today.  No drainage or pus.  No fevers or chills.  Review of Systems  All other systems reviewed and are negative.     Objective:  Physical Exam  General: AAO x3, NAD  Dermatological: There is mild hyperkeratotic tissue right foot submetatarsal 5 mild dried blood present underneath the callus.  Once I debrided this there is no underlying ulceration, drainage or any signs of infection.  Overall the area is doing better.  There is no other areas of discomfort at this time.    Vascular: Dorsalis Pedis artery and Posterior Tibial artery pedal pulses are palpable bilateral with immedate capillary fill time. Chonic lower extremity edema   Neruologic: Grossly intact via light touch bilateral.   Musculoskeletal: Bunion deformity noted with second toe overlapping the hallux.  Prominent metatarsal head.     Assessment:   Preulcerative lesion right foot    Plan:   Sharply debrided the lesion without any complications or bleeding.  There is no signs of infection there is no skin breakdown.  We discussed continue moisturizer, offloading.  Today also modified the insert inside in  his other shoe today to help decrease the pressure.  Discussed the importance today for inspection.  Return for pre-ulcerative callus as scheduled .  Charity Conch DPM

## 2024-01-08 ENCOUNTER — Ambulatory Visit (HOSPITAL_COMMUNITY)
Admission: RE | Admit: 2024-01-08 | Discharge: 2024-01-08 | Disposition: A | Discharge status: Home / Self Care | Source: Ambulatory Visit | Attending: Physician Assistant | Admitting: Physician Assistant

## 2024-01-08 DIAGNOSIS — C3491 Malignant neoplasm of unspecified part of right bronchus or lung: Secondary | ICD-10-CM | POA: Diagnosis present

## 2024-01-08 MED ORDER — IOHEXOL 300 MG/ML  SOLN
75.0000 mL | Freq: Once | INTRAMUSCULAR | Status: AC | PRN
  Administered 2024-01-08: 75 mL via INTRAVENOUS

## 2024-01-08 MED ORDER — SODIUM CHLORIDE (PF) 0.9 % IJ SOLN
INTRAMUSCULAR | Status: AC
  Filled 2024-01-08: qty 50

## 2024-01-09 ENCOUNTER — Encounter: Payer: Self-pay | Admitting: Physician Assistant

## 2024-01-09 ENCOUNTER — Encounter: Payer: Self-pay | Admitting: Internal Medicine

## 2024-01-09 NOTE — Progress Notes (Signed)
 [facility] [phone]   [phone]) [phone]  OFFICE PROGRESS NOTE  Ronna Coho, MD 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 Lorri Rota.  CURRENT THERAPY:  1) Systemic chemotherapy with daratumumab  subcutaneously weekly in addition to Velcade  1.3 Mg/M2 on days 1, 4, 8, 11 every 3 weeks as well as Revlimid  25 mg p.o. daily for 14 days every 3 weeks and Decadron  20 mg p.o. weekly with chemotherapy.  First cycle expected on 12/05/2022.  Status post 10 cycles.  Velcade  and Revlimid  were discontinued secondary to significant peripheral neuropathy and pancytopenia.  He is currently on maintenance treatment with daratumumab  every 4 weeks in addition to Decadron  20 mg p.o. on weekly basis. 2) Zometa  4 mg IV infusion every 4 weeks.  INTERVAL HISTORY: Ronald Duran 83 y.o. male returns to the clinic today for follow-up visit accompanied by his wife.  The patient is feeling fine today with no concerning complaints.  He denied having any current chest pain, shortness of breath, cough or hemoptysis.  He has no nausea, vomiting, diarrhea or constipation.  He has no headache or visual changes.  He has no recent weight loss or night sweats.  He has been tolerating his treatment fairly well.  He is here for evaluation before starting cycle #11 of his treatment.  MEDICAL HISTORY: Past Medical History:  Diagnosis Date   Atrial fibrillation (HCC)    CAD (coronary artery disease)    a. s/p MI with LAD stent placement December, 1999 -last stress test November 2008 with no EKG evidence of ischemia  b. cath 07/23/2015 after abnormal ETT and nuc, occluded mid LAD stent with good  distal collateral, medical therapy    Cardiomyopathy (HCC)    Ischemic cardiomyopathy with EF 40% by echo 2005   COVID 2021   mild case   Dyslipidemia    Erectile dysfunction    GERD (gastroesophageal reflux disease)    Hiatal hernia    with esophageal strictures and dilatations in the past - Dr. Joshua Nieves   History of nuclear stress test    Myoview 12/16: EF 47%, anterior, anteroseptal and inferoseptal defect suggesting infarct with peri-infarct ischemia, intermediate risk   HTN (hypertension)    Hx of seasonal allergies    Hyperlipidemia    Myocardial infarction (HCC) 08/01/1998   PAD (peripheral artery disease) (HCC)    Pre-diabetes    Prostate cancer (HCC)    with seed implant therapy following Lupron therapy in 2008 and 2009 - followed by Dr. Bosie Bye   Ringworm    Stroke (HCC) 04/20/2017   seen on a MRI   Vitamin D deficiency     ALLERGIES:  has no known allergies.  MEDICATIONS:  Current Outpatient Medications  Medication Sig Dispense Refill   acyclovir  (ZOVIRAX ) 200 MG capsule Take 1 capsule (200 mg total) by mouth 2 (two) times daily. 60 capsule 5   atorvastatin  (LIPITOR) 40 MG tablet Take 40 mg by mouth at bedtime.     B Complex Vitamins (B COMPLEX 100) tablet Take 1 tablet by mouth daily. Balance     cetirizine  (ZYRTEC  ALLERGY) 10 MG tablet Take 1 tablet (10 mg total) by mouth  daily. 30 tablet 0   Cholecalciferol (VITAMIN D3) 2000 UNITS TABS Take 2,000 Units by mouth daily.     ciclopirox  (PENLAC ) 8 % solution Apply topically at bedtime. Apply over nail and surrounding skin. Apply daily over previous coat. After seven (7) days, may remove with alcohol and continue cycle. 6.6 mL 2   clindamycin (CLEOCIN T) 1 % lotion Apply 1 % topically 2 (two) times daily.     Coenzyme Q10 (CO Q-10) 100 MG CAPS Take 100 mg by mouth daily with supper.     cyanocobalamin (VITAMIN B12) 1000 MCG tablet Take 2,500 mcg by mouth daily.     dexamethasone  (DECADRON ) 4 MG tablet TAKE 5 tabS  weekly, start with the first dose of chemotherapy. 40 tablet 3   fluticasone (CUTIVATE) 0.05 % cream Apply 1 Application topically 2 (two) times daily. As needed     furosemide  (LASIX ) 40 MG tablet Take 1 tablet (40 mg total) by mouth 2 (two) times daily. 60 tablet 11   ketoconazole (NIZORAL) 2 % cream Apply 1 Application topically daily as needed for irritation.     metoprolol  tartrate (LOPRESSOR ) 25 MG tablet Take 1 tablet (25 mg total) by mouth 2 (two) times daily. 90 tablet 3   Multiple Vitamins-Minerals (CENTRUM) tablet Take 1 tablet by mouth daily.     nitroGLYCERIN  (NITROSTAT ) 0.4 MG SL tablet PLACE 1 TABLET UNDER THE TONGUE IF NEEDED EVERY 5 MINUTES FOR CHEST PAIN FOR 3 DOSES IF NO RELIEF AFTER FIRST DOSE CALL 911. 25 tablet 9   potassium chloride  SA (KLOR-CON  M) 20 MEQ tablet Take 1 tablet (20 mEq total) by mouth daily. 30 tablet 11   warfarin (COUMADIN ) 5 MG tablet TAKE 1/2 TO 1 TABLET BY MOUTH AS DIRECTED by coumadin  clinic 30 tablet 5   No current facility-administered medications for this visit.    SURGICAL HISTORY:  Past Surgical History:  Procedure Laterality Date   BRONCHIAL BIOPSY  11/15/2022   Procedure: BRONCHIAL BIOPSIES;  Surgeon: Prudy Brownie, DO;  Location: [facility];  Service: Pulmonary;;   BRONCHIAL BRUSHINGS  11/15/2022   Procedure: BRONCHIAL BRUSHINGS;  Surgeon: Prudy Brownie, DO;  Location: [facility];  Service: Pulmonary;;   BRONCHIAL NEEDLE ASPIRATION BIOPSY  11/15/2022   Procedure: BRONCHIAL NEEDLE ASPIRATION BIOPSIES;  Surgeon: Prudy Brownie, DO;  Location: [facility]OS[facility];  Service: Pulmonary;;   CARDIAC CATHETERIZATION N/A 07/23/2015   Procedure: Left Heart Cath and Coronary Angiography;  Surgeon: Arty Binning, MD;  Location: [facility] INVASIVE CV LAB;  Service: Cardiovascular;  Laterality: N/A;   CARDIOVERSION N/A 06/03/2022   Procedure: CARDIOVERSION;  Surgeon: Elmyra Haggard, MD;  Location: [facility] ENDOSCOPY;  Service: Cardiovascular;  Laterality: N/A;    COLONOSCOPY WITH PROPOFOL  N/A 11/24/2015   Procedure: COLONOSCOPY WITH PROPOFOL ;  Surgeon: Garrett Kallman, MD;  Location: [facility] ENDOSCOPY;  Service: Endoscopy;  Laterality: N/A;   esophageal strictures with dilatations in the past per Dr. Joshua Nieves     last '98   FIDUCIAL MARKER PLACEMENT  11/15/2022   Procedure: FIDUCIAL MARKER PLACEMENT;  Surgeon: Prudy Brownie, DO;  Location: [facility] ENDOS[facility];  Service: Pulmonary;;   HERNIA REPAIR Left    INTRAMEDULLARY (IM) NAIL INTERTROCHANTERIC Left 10/25/2022   Procedure: LEFT INTRAMEDULLARY (IM) NAIL INTERTROCHANTERIC;  Surgeon: Wes Hamman, MD;  Location: [facility] OR;  Service: Orthopedics;  Laterality: Left;   IR BONE MARROW BIOPSY & ASPIRATION  11/17/2022   IR BONE TUMOR(S)RF ABLATION  10/11/2022   IR KYPHO THORACIC WITH  BONE BIOPSY  10/11/2022   LAD stent placement - Dr. Kay Parson     left inguinal hernia repair - Dr. Criselda Dolly     prostatic radioactive seed implantation - Dr. Ike Malady     '09    REVIEW OF SYSTEMS:  A comprehensive review of systems was negative except for: Constitutional: positive for fatigue Musculoskeletal: positive for arthralgias   PHYSICAL EXAMINATION: General appearance: alert, cooperative, fatigued, and no distress Head: Normocephalic, without obvious abnormality, atraumatic Neck: no adenopathy, no JVD, supple, symmetrical, trachea midline, and thyroid not enlarged, symmetric, no tenderness/mass/nodules Lymph nodes: Cervical, supraclavicular, and axillary nodes normal. Resp: clear to auscultation bilaterally Back: symmetric, no curvature. ROM normal. No CVA tenderness. Cardio: regular rate and rhythm, S1, S2 normal, no murmur, click, rub or gallop GI: soft, non-tender; bowel sounds normal; no masses,  no organomegaly Extremities: extremities normal, atraumatic, no cyanosis or edema  ECOG PERFORMANCE STATUS: 1 - Symptomatic but completely ambulatory  There were no vitals taken for this visit.  LABORATORY DATA: Lab  Results  Component Value Date   WBC 12.2 (H) 12/18/2023   HGB 15.5 12/18/2023   HCT 46.1 12/18/2023   MCV 100.4 (H) 12/18/2023   PLT 117 (L) 12/18/2023      Chemistry      Component Value Date/Time   NA 143 12/27/2023 0000   K 3.9 12/27/2023 0000   CL 106 12/27/2023 0000   CO2 20 12/27/2023 0000   BUN 20 12/27/2023 0000   CREATININE 1.15 12/27/2023 0000   CREATININE 1.15 12/18/2023 0930   CREATININE 1.23 (H) 07/21/2015 0917      Component Value Date/Time   CALCIUM  9.5 12/27/2023 0000   ALKPHOS 85 12/18/2023 0930   AST 22 12/18/2023 0930   ALT 32 12/18/2023 0930   BILITOT 1.9 (H) 12/18/2023 0930       RADIOGRAPHIC STUDIES: No results found.  ASSESSMENT AND PLAN: This is a very pleasant 83 years old white male with multiple myeloma presented with multiple lytic lesions involving T10, left femoral neck as well as T4, T9 and T11 vertebral bodies. Also had right lower lobe lung nodule that was suspicious for non-small cell lung cancer. 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 The biopsy from the left hip was negative for malignancy. His myeloma panel showed no concerning findings except for elevated IgG but the serum light chains were normal. The patient underwent left hip fixation for impending fracture. He also underwent palliative radiotherapy under the care of Dr. Lorri Rota. His recent bone marrow biopsy and aspirate was consistent with plasma cell neoplasm with involvement of plasma cells 25% of the bone marrow. The patient is currently on treatment with daratumumab  and dexamethasone  started on 12/05/2022 status post 10 cycles.  Velcade  and Revlimid  were discontinued secondary to adverse effect and intolerance.  He had repeat myeloma panel that showed no concerning findings for progression and he continues to have good response to the treatment.  He has been  tolerating this treatment well with no concerning adverse effects. I recommended for him to continue his current treatment as planned. He will come back for follow-up visit in 4 weeks for evaluation before the next cycle of his treatment. For the suspicious right lower lobe lung cancer, he underwent SBRT under the care of Dr. Lorri Rota. He was advised to call immediately if he has any other concerning symptoms in the interval. The  patient voices understanding of current disease status and treatment options and is in agreement with the current care plan.  All questions were answered. The patient knows to call the clinic with any problems, questions or concerns. We can certainly see the patient much sooner if necessary.  The total time spent in the appointment was 30 minutes.  Disclaimer: This note was dictated with voice recognition software. Similar sounding words can inadvertently be transcribed and may not be corrected upon review.

## 2024-01-10 ENCOUNTER — Encounter

## 2024-01-15 ENCOUNTER — Inpatient Hospital Stay: Attending: Internal Medicine

## 2024-01-15 ENCOUNTER — Inpatient Hospital Stay (HOSPITAL_BASED_OUTPATIENT_CLINIC_OR_DEPARTMENT_OTHER): Admitting: Internal Medicine

## 2024-01-15 ENCOUNTER — Inpatient Hospital Stay

## 2024-01-15 VITALS — BP 101/79 | HR 74 | Temp 97.4°F | Resp 18 | Ht 72.0 in | Wt 176.2 lb

## 2024-01-15 DIAGNOSIS — Z79620 Long term (current) use of immunosuppressive biologic: Secondary | ICD-10-CM | POA: Diagnosis not present

## 2024-01-15 DIAGNOSIS — Z5112 Encounter for antineoplastic immunotherapy: Secondary | ICD-10-CM | POA: Insufficient documentation

## 2024-01-15 DIAGNOSIS — C9000 Multiple myeloma not having achieved remission: Secondary | ICD-10-CM | POA: Insufficient documentation

## 2024-01-15 DIAGNOSIS — C3491 Malignant neoplasm of unspecified part of right bronchus or lung: Secondary | ICD-10-CM | POA: Diagnosis not present

## 2024-01-15 LAB — CMP (CANCER CENTER ONLY)
ALT: 32 U/L (ref 0–44)
AST: 21 U/L (ref 15–41)
Albumin: 3.7 g/dL (ref 3.5–5.0)
Alkaline Phosphatase: 86 U/L (ref 38–126)
Anion gap: 6 (ref 5–15)
BUN: 21 mg/dL (ref 8–23)
CO2: 27 mmol/L (ref 22–32)
Calcium: 9.3 mg/dL (ref 8.9–10.3)
Chloride: 105 mmol/L (ref 98–111)
Creatinine: 1.12 mg/dL (ref 0.61–1.24)
GFR, Estimated: >60 mL/min (ref >60)
Glucose, Bld: 131 mg/dL — ABNORMAL HIGH (ref 70–99)
Potassium: 4.1 mmol/L (ref 3.5–5.1)
Sodium: 138 mmol/L (ref 135–145)
Total Bilirubin: 1.5 mg/dL — ABNORMAL HIGH (ref 0.0–1.2)
Total Protein: 5.5 g/dL — ABNORMAL LOW (ref 6.5–8.1)

## 2024-01-15 LAB — CBC WITH DIFFERENTIAL (CANCER CENTER ONLY)
Abs Immature Granulocytes: 0.07 10*3/uL (ref 0.00–0.07)
Basophils Absolute: 0.1 10*3/uL (ref 0.0–0.1)
Basophils Relative: 1 %
Eosinophils Absolute: 0.2 10*3/uL (ref 0.0–0.5)
Eosinophils Relative: 2 %
HCT: 44.6 % (ref 39.0–52.0)
Hemoglobin: 15.3 g/dL (ref 13.0–17.0)
Immature Granulocytes: 1 %
Lymphocytes Relative: 3 %
Lymphs Abs: 0.3 10*3/uL — ABNORMAL LOW (ref 0.7–4.0)
MCH: 34.1 pg — ABNORMAL HIGH (ref 26.0–34.0)
MCHC: 34.3 g/dL (ref 30.0–36.0)
MCV: 99.3 fL (ref 80.0–100.0)
Monocytes Absolute: 0.5 10*3/uL (ref 0.1–1.0)
Monocytes Relative: 4 %
Neutro Abs: 10.8 10*3/uL — ABNORMAL HIGH (ref 1.7–7.7)
Neutrophils Relative %: 89 %
Platelet Count: 111 10*3/uL — ABNORMAL LOW (ref 150–400)
RBC: 4.49 MIL/uL (ref 4.22–5.81)
RDW: 14.3 % (ref 11.5–15.5)
WBC Count: 11.9 10*3/uL — ABNORMAL HIGH (ref 4.0–10.5)
nRBC: 0 % (ref 0.0–0.2)

## 2024-01-15 MED ORDER — DARATUMUMAB-HYALURONIDASE-FIHJ 1800-30000 MG-UT/15ML ~~LOC~~ SOLN
1800.0000 mg | Freq: Once | SUBCUTANEOUS | Status: AC
  Administered 2024-01-15: 1800 mg via SUBCUTANEOUS
  Filled 2024-01-15: qty 15

## 2024-01-15 MED ORDER — ACETAMINOPHEN 325 MG PO TABS
650.0000 mg | ORAL_TABLET | Freq: Once | ORAL | Status: AC
  Administered 2024-01-15: 650 mg via ORAL
  Filled 2024-01-15: qty 2

## 2024-01-15 MED ORDER — DIPHENHYDRAMINE HCL 25 MG PO CAPS
50.0000 mg | ORAL_CAPSULE | Freq: Once | ORAL | Status: AC
  Administered 2024-01-15: 50 mg via ORAL
  Filled 2024-01-15: qty 2

## 2024-01-15 MED ORDER — ZOLEDRONIC ACID 4 MG/100ML IV SOLN
4.0000 mg | Freq: Once | INTRAVENOUS | Status: AC
  Administered 2024-01-15: 4 mg via INTRAVENOUS
  Filled 2024-01-15: qty 100

## 2024-01-15 MED ORDER — SODIUM CHLORIDE 0.9 % IV SOLN: INTRAVENOUS | Status: DC

## 2024-01-15 NOTE — Progress Notes (Signed)
 [facility] [phone]   [phone]) [phone]  OFFICE PROGRESS NOTE  Ronna Coho, MD [address] [place] [address]  DIAGNOSIS:  1) Multiple myeloma, presented with plasmacytoma as well as other lytic lesions in the bone diagnosed in March 2024 2) right lower lobe pulmonary nodule suspicious for non-small cell lung cancer  PRIOR THERAPY: SBRT to the right lower lobe pulmonary nodule under the care of Dr. Lorri Rota.  CURRENT THERAPY:  1) Systemic chemotherapy with daratumumab  subcutaneously weekly in addition to Velcade  1.3 Mg/M2 on days 1, 4, 8, 11 every 3 weeks as well as Revlimid  25 mg p.o. daily for 14 days every 3 weeks and Decadron  20 mg p.o. weekly with chemotherapy.  First cycle expected on 12/05/2022.  Status post 13 cycles.  Velcade  and Revlimid  were discontinued secondary to significant peripheral neuropathy and pancytopenia.  He is currently on maintenance treatment with daratumumab  every 4 weeks in addition to Decadron  20 mg p.o. on weekly basis. 2) Zometa  4 mg IV infusion every 4 weeks.  INTERVAL HISTORY: Ronald Duran 83 y.o. male returns to the clinic today for follow-up visit accompanied by his wife. Discussed the use of AI scribe software for clinical note transcription with the patient, who gave verbal consent to proceed.  History of Present Illness   Ronald Duran is an 83 year old male with multiple myeloma who presents for evaluation before starting cycle 17 of treatment. He is accompanied by his wife.  He was diagnosed with multiple myeloma in March 2024. Initial treatment included subcutaneous daratumumab , subcutaneous Velcade , Revlimid , and Decadron . Velcade  and Revlimid  were discontinued due to intolerance, and he is currently on subcutaneous daratumumab  and Decadron .  He notes that neuropathy has become a 'bigger issue' but otherwise states that his condition is 'much the same' with no significant  changes. No overall malaise is reported.  A recent scan of the chest was performed to evaluate a nodule in the right lower lobe of his lung. He feels well overall.      MEDICAL HISTORY: Past Medical History:  Diagnosis Date   Atrial fibrillation (HCC)    CAD (coronary artery disease)    a. s/p MI with LAD stent placement December, 1999 -last stress test November 2008 with no EKG evidence of ischemia  b. cath 07/23/2015 after abnormal ETT and nuc, occluded mid LAD stent with good distal collateral, medical therapy    Cardiomyopathy (HCC)    Ischemic cardiomyopathy with EF 40% by echo 2005   COVID 2021   mild case   Dyslipidemia    Erectile dysfunction    GERD (gastroesophageal reflux disease)    Hiatal hernia    with esophageal strictures and dilatations in the past - Dr. Joshua Nieves   History of nuclear stress test    Myoview 12/16: EF 47%, anterior, anteroseptal and inferoseptal defect suggesting infarct with peri-infarct ischemia, intermediate risk   HTN (hypertension)    Hx of seasonal allergies    Hyperlipidemia    Myocardial infarction (HCC) 08/01/1998   PAD (peripheral artery disease) (HCC)    Pre-diabetes    Prostate cancer (HCC)    with seed implant therapy following Lupron therapy in 2008 and 2009 - followed by Dr. Bosie Bye   Ringworm    Stroke (HCC) 04/20/2017   seen on a MRI   Vitamin D deficiency     ALLERGIES:  has no known allergies.  MEDICATIONS:  Current Outpatient Medications  Medication Sig Dispense Refill   acyclovir  (ZOVIRAX ) 200 MG capsule Take 1 capsule (200 mg total) by mouth 2 (two) times daily. 60 capsule 5   atorvastatin  (LIPITOR) 40 MG tablet Take 40 mg by mouth at bedtime.     B Complex Vitamins (B COMPLEX 100) tablet Take 1 tablet by mouth daily. Balance     cetirizine  (ZYRTEC  ALLERGY) 10 MG tablet Take 1 tablet (10 mg total) by mouth daily. 30 tablet 0   Cholecalciferol (VITAMIN D3) 2000 UNITS TABS Take 2,000 Units by mouth daily.      ciclopirox  (PENLAC ) 8 % solution Apply topically at bedtime. Apply over nail and surrounding skin. Apply daily over previous coat. After seven (7) days, may remove with alcohol and continue cycle. 6.6 mL 2   clindamycin (CLEOCIN T) 1 % lotion Apply 1 % topically 2 (two) times daily.     Coenzyme Q10 (CO Q-10) 100 MG CAPS Take 100 mg by mouth daily with supper.     cyanocobalamin (VITAMIN B12) 1000 MCG tablet Take 2,500 mcg by mouth daily.     dexamethasone  (DECADRON ) 4 MG tablet TAKE 5 tabS weekly, start with the first dose of chemotherapy. 40 tablet 3   fluticasone (CUTIVATE) 0.05 % cream Apply 1 Application topically 2 (two) times daily. As needed     furosemide  (LASIX ) 40 MG tablet Take 1 tablet (40 mg total) by mouth 2 (two) times daily. 60 tablet 11   ketoconazole (NIZORAL) 2 % cream Apply 1 Application topically daily as needed for irritation.     metoprolol  tartrate (LOPRESSOR ) 25 MG tablet Take 1 tablet (25 mg total) by mouth 2 (two) times daily. 90 tablet 3   Multiple Vitamins-Minerals (CENTRUM) tablet Take 1 tablet by mouth daily.     nitroGLYCERIN  (NITROSTAT ) 0.4 MG SL tablet PLACE 1 TABLET UNDER THE TONGUE IF NEEDED EVERY 5 MINUTES FOR CHEST PAIN FOR 3 DOSES IF NO RELIEF AFTER FIRST DOSE CALL 911. 25 tablet 9   potassium chloride  SA (KLOR-CON  M) 20 MEQ tablet Take 1 tablet (20 mEq total) by mouth daily. 30 tablet 11   warfarin (COUMADIN ) 5 MG tablet TAKE 1/2 TO 1 TABLET BY MOUTH AS DIRECTED by coumadin  clinic 30 tablet 5   No current facility-administered medications for this visit.    SURGICAL HISTORY:  Past Surgical History:  Procedure Laterality Date   BRONCHIAL BIOPSY  11/15/2022   Procedure: BRONCHIAL BIOPSIES;  Surgeon: Prudy Brownie, DO;  Location: [facility];  Service: Pulmonary;;   BRONCHIAL BRUSHINGS  11/15/2022   Procedure: BRONCHIAL BRUSHINGS;  Surgeon: Prudy Brownie, DO;  Location: [facility];  Service: Pulmonary;;   BRONCHIAL NEEDLE ASPIRATION BIOPSY  11/15/2022    Procedure: BRONCHIAL NEEDLE ASPIRATION BIOPSIES;  Surgeon: Prudy Brownie, DO;  Location: [facility];  Service: Pulmonary;;   CARDIAC CATHETERIZATION N/A 07/23/2015   Procedure: Left Heart Cath and Coronary Angiography;  Surgeon: Arty Binning, MD;  Location: [facility] INVASIVE CV LAB;  Service: Cardiovascular;  Laterality: N/A;   CARDIOVERSION N/A 06/03/2022   Procedure: CARDIOVERSION;  Surgeon: Elmyra Haggard, MD;  Location: [facility] ENDOSCOPY;  Service: Cardiovascular;  Laterality: N/A;   COLONOSCOPY WITH PROPOFOL  N/A 11/24/2015   Procedure: COLONOSCOPY WITH PROPOFOL ;  Surgeon: Garrett Kallman, MD;  Location: [facility]COPY;  Service: Endoscopy;  Laterality: N/A;   esophageal strictures with dilatations in the past per Dr. Joshua Nieves     last '98   FIDUCIAL MARKER PLACEMENT  11/15/2022   Procedure: FIDUCIAL MARKER  PLACEMENT;  Surgeon: Prudy Brownie, DO;  Location: [facility];  Service: Pulmonary;;   HERNIA REPAIR Left    INTRAMEDULLARY (IM) NAIL INTERTROCHANTERIC Left 10/25/2022   Procedure: LEFT INTRAMEDULLARY (IM) NAIL INTERTROCHANTERIC;  Surgeon: Wes Hamman, MD;  Location: [facility] OR;  Service: Orthopedics;  Laterality: Left;   IR BONE MARROW BIOPSY & ASPIRATION  11/17/2022   IR BONE TUMOR(S)RF ABLATION  10/11/2022   IR KYPHO THORACIC WITH BONE BIOPSY  10/11/2022   LAD stent placement - Dr. Kay Parson     left inguinal hernia repair - Dr. Criselda Dolly     prostatic radioactive seed implantation - Dr. Ike Malady     '09    REVIEW OF SYSTEMS:  Constitutional: positive for fatigue Eyes: negative Ears, nose, mouth, throat, and face: negative Respiratory: negative Cardiovascular: negative Gastrointestinal: negative Genitourinary:negative Integument/breast: negative Hematologic/lymphatic: negative Musculoskeletal:positive for arthralgias Neurological: positive for paresthesia Behavioral/Psych: negative Endocrine: negative Allergic/Immunologic: negative   PHYSICAL EXAMINATION: General  appearance: alert, cooperative, fatigued, and no distress Head: Normocephalic, without obvious abnormality, atraumatic Neck: no adenopathy, no JVD, supple, symmetrical, trachea midline, and thyroid not enlarged, symmetric, no tenderness/mass/nodules Lymph nodes: Cervical, supraclavicular, and axillary nodes normal. Resp: clear to auscultation bilaterally Back: symmetric, no curvature. ROM normal. No CVA tenderness. Cardio: irregularly irregular rhythm GI: soft, non-tender; bowel sounds normal; no masses,  no organomegaly Extremities: extremities normal, atraumatic, no cyanosis or edema Neurologic: Alert and oriented X 3, normal strength and tone. Normal symmetric reflexes. Normal coordination and gait  ECOG PERFORMANCE STATUS: 1 - Symptomatic but completely ambulatory  Blood pressure 101/79, pulse 74, temperature (!) 97.4 F (36.3 C), temperature source Tympanic, resp. rate 18, height 6' (1.829 m), weight 176 lb 3.2 oz (79.9 kg), SpO2 98%.  LABORATORY DATA: Lab Results  Component Value Date   WBC 11.9 (H) 01/15/2024   HGB 15.3 01/15/2024   HCT 44.6 01/15/2024   MCV 99.3 01/15/2024   PLT 111 (L) 01/15/2024      Chemistry      Component Value Date/Time   NA 143 12/27/2023 0000   K 3.9 12/27/2023 0000   CL 106 12/27/2023 0000   CO2 20 12/27/2023 0000   BUN 20 12/27/2023 0000   CREATININE 1.15 12/27/2023 0000   CREATININE 1.15 12/18/2023 0930   CREATININE 1.23 (H) 07/21/2015 0917      Component Value Date/Time   CALCIUM  9.5 12/27/2023 0000   ALKPHOS 85 12/18/2023 0930   AST 22 12/18/2023 0930   ALT 32 12/18/2023 0930   BILITOT 1.9 (H) 12/18/2023 0930       RADIOGRAPHIC STUDIES: CT Chest W Contrast Result Date: 01/10/2024 EXAM: CT CHEST WITH CONTRAST 01/08/2024 03:40:32 PM TECHNIQUE: CT of the chest was performed with the administration of intravenous contrast. Multiplanar reformatted images are provided for review. Automated exposure control, iterative reconstruction,  and/or weight based adjustment of the mA/kV was utilized to reduce the radiation dose to as low as reasonably achievable. COMPARISON: 11/11/2022 CLINICAL HISTORY: Non-small cell lung cancer (NSCLC), non-metastatic, assess treatment response; follow up on presumed lung cancer that was treated with SBRT. Scan due to NSCLC of right lung. FINDINGS: MEDIASTINUM: Mild cardiomegaly. The central airways are clear. LYMPH NODES: No mediastinal, hilar or axillary lymphadenopathy. LUNGS AND PLEURA: Mild centrilobular emphysematous changes, upper lung predominant. No frank interstitial edema. Right lower lobe compressive atelectasis. Prior pulmonary nodule is obscured by suspected radiation changes (image 110). 3 mm perifissural nodule along the right major fissure (image 90), benign. Moderate right and small  left pleural effusions, new. Mild bibasilar pleural parenchymal scarring. SOFT TISSUES/BONES: No acute abnormality of the bones or soft tissues. UPPER ABDOMEN: Limited images of the upper abdomen demonstrates no acute abnormality. VASCULATURE: Mild thoracic aortic atherosclerosis. Moderate 3-vessel coronary atherosclerosis. IMPRESSION: 1. Suspected right lower lobe radiation changes, obscured. 2. No evidence of recurrent or metastatic disease. 3. Mild cardiomegaly. Moderate right and small left pleural effusions. No frank interstitial edema. Electronically signed by: Zadie Herter MD 01/10/2024 09:34 PM EDT RP Workstation: [id number]    ASSESSMENT AND PLAN: This is a very pleasant 83 years old white male with multiple myeloma presented with multiple lytic lesions involving T10, left femoral neck as well as T4, T9 and T11 vertebral bodies. Also had right lower lobe lung nodule that was suspicious for non-small cell lung cancer. 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 The biopsy from the left hip was  negative for malignancy. His myeloma panel showed no concerning findings except for elevated IgG but the serum light chains were normal. The patient underwent left hip fixation for impending fracture. He also underwent palliative radiotherapy under the care of Dr. Lorri Rota. His recent bone marrow biopsy and aspirate was consistent with plasma cell neoplasm with involvement of plasma cells 25% of the bone marrow. The patient is currently on treatment with daratumumab  and dexamethasone  started on 12/05/2022 status post 16 cycles.  Velcade  and Revlimid  were discontinued secondary to adverse effect and intolerance.  He has been tolerating his treatment with subcutaneous daratumumab  and Decadron  fairly well.  He is here today for evaluation before starting cycle #17. For the suspicious right lower lobe lung cancer, he underwent SBRT under the care of Dr. Lorri Rota. He had repeat CT scan of the chest performed recently.  I personally and independently reviewed the scan and discussed the result with the patient and his wife.  His scan showed no concerning finding for disease recurrence or metastasis. Assessment and Plan    Multiple myeloma Diagnosed in March 2024, currently undergoing treatment with subcutaneous daratumumab  and Decadron . Initial treatment with subcutaneous Velcade  and Revlimid  was discontinued due to intolerance. He is post 16 cycles and starting cycle 17. Myeloma panel indicates well-managed disease. Recent chest scan shows no recurrence in the right lower lobe lung nodule. The goal is remission, though stable disease is acceptable. He is tolerating treatment well. - Continue subcutaneous daratumumab  and Decadron  - Monitor treatment tolerance and disease status - Offer treatment breaks if needed  Neuropathy Significant issue impacting quality of life, though specifics are not detailed.  Thrombocytopenia Platelet count at 111,000, low but sufficient for treatment continuation. Counts have  been better previously but are adequate for ongoing therapy. - Monitor platelet counts regularly  Elevated blood sugar due to steroids Attributed to Decadron  use. Elevation noted but not problematic currently. - Monitor blood sugar levels  Goals of Care He understands the incurable nature of multiple myeloma and the ongoing nature of treatment. Remission is the goal, with stable disease as an acceptable outcome. He is informed about the potential for long-term management and is open to treatment breaks as needed. - Regularly discuss and review goals of care - Consider treatment breaks based on preferences and tolerance   He was advised to call immediately if he has any concerning symptoms in the interval. The patient voices understanding of current disease status and treatment options and is in agreement with the current care  plan.  All questions were answered. The patient knows to call the clinic with any problems, questions or concerns. We can certainly see the patient much sooner if necessary. The total time spent in the appointment was 30 minutes including review of chart and various tests results, discussions about plan of care and coordination of care plan .   Disclaimer: This note was dictated with voice recognition software. Similar sounding words can inadvertently be transcribed and may not be corrected upon review.

## 2024-01-15 NOTE — Patient Instructions (Signed)
 [facility] - A DEPT OF [facility]   Discharge Instructions: Thank you for choosing [facility] to provide your oncology and hematology care.   If you have a lab appointment with the Cancer Center, please go directly to the Cancer Center and check in at the registration area.   Wear comfortable clothing and clothing appropriate for easy access to any Portacath or PICC line.   We strive to give you quality time with your provider. You may need to reschedule your appointment if you arrive late (15 or more minutes).  Arriving late affects you and other patients whose appointments are after yours.  Also, if you miss three or more appointments without notifying the office, you may be dismissed from the clinic at the provider's discretion.      For prescription refill requests, have your pharmacy contact our office and allow 72 hours for refills to be completed.    Today you received the following chemotherapy and/or immunotherapy agents: Daratumumab hyaluronidase (Darzalex faspro)      To help prevent nausea and vomiting after your treatment, we encourage you to take your nausea medication as directed.  BELOW ARE SYMPTOMS THAT SHOULD BE REPORTED IMMEDIATELY: *FEVER GREATER THAN 100.4 F (38 C) OR HIGHER *CHILLS OR SWEATING *NAUSEA AND VOMITING THAT IS NOT CONTROLLED WITH YOUR NAUSEA MEDICATION *UNUSUAL SHORTNESS OF BREATH *UNUSUAL BRUISING OR BLEEDING *URINARY PROBLEMS (pain or burning when urinating, or frequent urination) *BOWEL PROBLEMS (unusual diarrhea, constipation, pain near the anus) TENDERNESS IN MOUTH AND THROAT WITH OR WITHOUT PRESENCE OF ULCERS (sore throat, sores in mouth, or a toothache) UNUSUAL RASH, SWELLING OR PAIN  UNUSUAL VAGINAL DISCHARGE OR ITCHING   Items with * indicate a potential emergency and should be followed up as soon as possible or go to the Emergency Department if any problems should occur.  Please show the  CHEMOTHERAPY ALERT CARD or IMMUNOTHERAPY ALERT CARD at check-in to the Emergency Department and triage nurse.  Should you have questions after your visit or need to cancel or reschedule your appointment, please contact [facility] - A DEPT OF [facility]  Dept: [phone]  and follow the prompts.  Office hours are 8:00 a.m. to 4:30 p.m. Monday - Friday. Please note that voicemails left after 4:00 p.m. may not be returned until the following business day.  We are closed weekends and major holidays. You have access to a nurse at all times for urgent questions. Please call the main number to the clinic Dept: [phone] and follow the prompts.   For any non-urgent questions, you may also contact your provider using MyChart. We now offer e-Visits for anyone 60 and older to request care online for non-urgent symptoms. For details visit [email or web address].   Also download the MyChart app! Go to the app store, search "MyChart", open the app, select [facility], and log in with your MyChart username and password.

## 2024-01-16 ENCOUNTER — Encounter: Payer: Self-pay | Admitting: Podiatry

## 2024-01-16 ENCOUNTER — Ambulatory Visit (INDEPENDENT_AMBULATORY_CARE_PROVIDER_SITE_OTHER): Admitting: Podiatry

## 2024-01-16 DIAGNOSIS — M216X1 Other acquired deformities of right foot: Secondary | ICD-10-CM | POA: Diagnosis not present

## 2024-01-16 DIAGNOSIS — M79674 Pain in right toe(s): Secondary | ICD-10-CM

## 2024-01-16 DIAGNOSIS — M79675 Pain in left toe(s): Secondary | ICD-10-CM

## 2024-01-16 DIAGNOSIS — L84 Corns and callosities: Secondary | ICD-10-CM | POA: Diagnosis not present

## 2024-01-16 DIAGNOSIS — B351 Tinea unguium: Secondary | ICD-10-CM | POA: Diagnosis not present

## 2024-01-16 NOTE — Progress Notes (Unsigned)
 Subjective:   Patient ID: Ronald Duran, male   DOB: 83 y.o.   MRN: [id number]   HPI Chief Complaint  Patient presents with   RFC    RM#11 RFC/CALLUS     83 year old male presents the office today with above concerns.  States the callus on the right foot is doing much better and he is not having any pain with it.  No swelling, redness or drainage or any signs of infection.   He also needs his nails trimmed today as they are thickened elongated he cannot himself.   Review of Systems  All other systems reviewed and are negative.     Objective:  Physical Exam  General: AAO x3, NAD  Dermatological: There is very minimal hyperkeratotic tissue right foot submetatarsal 5 mild dried blood present underneath the callus.  There is no ulceration identified.  There is no surrounding erythema, ascending cellulitis.  No drainage or pus.  No fluctuation or crepitation.  No malodor.   Vascular: Dorsalis Pedis artery and Posterior Tibial artery pedal pulses are palpable bilateral with immedate capillary fill time. Chonic lower extremity edema   Neruologic: Grossly intact via light touch bilateral.   Musculoskeletal: Bunion deformity noted with second toe overlapping the hallux.  Prominent metatarsal head.     Assessment:   Preulcerative lesion right foot; abdominal mycosis    Plan:   Skin lesion right foot - Better.  Still small dried blood in the area is preulcerative but there is no significant tissue debrided today.  Continue offloading.  Discussed moisturizer.  Symptomatic onychomycosis - Sharply debrided nails x 10 without any complications or bleeding.  Return in about 2 months (around 03/17/2024).  Charity Conch DPM

## 2024-01-17 ENCOUNTER — Other Ambulatory Visit: Payer: Self-pay

## 2024-01-19 ENCOUNTER — Ambulatory Visit: Admitting: Podiatry

## 2024-01-26 ENCOUNTER — Ambulatory Visit: Attending: Cardiology

## 2024-01-26 DIAGNOSIS — I6349 Cerebral infarction due to embolism of other cerebral artery: Secondary | ICD-10-CM

## 2024-01-26 DIAGNOSIS — I4819 Other persistent atrial fibrillation: Secondary | ICD-10-CM

## 2024-01-26 DIAGNOSIS — Z7901 Long term (current) use of anticoagulants: Secondary | ICD-10-CM | POA: Diagnosis not present

## 2024-01-26 DIAGNOSIS — Z79899 Other long term (current) drug therapy: Secondary | ICD-10-CM

## 2024-01-26 LAB — POCT INR: INR: 4 — AB (ref 2.0–3.0)

## 2024-01-26 NOTE — Progress Notes (Signed)
Please see anticoagulation encounter.

## 2024-01-26 NOTE — Patient Instructions (Signed)
 Hold tonight and Saturday then Continue taking warfarin 1/2 tablet daily EXCEPT 1 tablet on Sundays, Tuesdays and Thursdays.  Continue eating broccoli every other day.  Recheck INR 3 weeks.  Anticoagulation Clinic-[phone].

## 2024-02-06 ENCOUNTER — Ambulatory Visit (HOSPITAL_COMMUNITY)
Admission: RE | Admit: 2024-02-06 | Discharge: 2024-02-06 | Disposition: A | Discharge status: Home / Self Care | Source: Ambulatory Visit | Attending: Cardiology | Admitting: Cardiology

## 2024-02-06 DIAGNOSIS — I5022 Chronic systolic (congestive) heart failure: Secondary | ICD-10-CM | POA: Diagnosis present

## 2024-02-06 MED ORDER — PERFLUTREN LIPID MICROSPHERE
1.0000 mL | INTRAVENOUS | Status: AC | PRN
  Administered 2024-02-06: 5 mL via INTRAVENOUS

## 2024-02-08 ENCOUNTER — Ambulatory Visit: Payer: Self-pay | Admitting: Cardiovascular Disease

## 2024-02-08 LAB — ECHOCARDIOGRAM COMPLETE
Area-P 1/2: 4.01 cm2
P 1/2 time: 570 ms
S' Lateral: 4.9 cm

## 2024-02-08 NOTE — Progress Notes (Signed)
 [facility] OFFICE PROGRESS NOTE  Kip Righter, MD [address] [place] [address]  DIAGNOSIS:  1) Multiple myeloma, presented with plasmacytoma as well as other lytic lesions in the bone diagnosed in March 2024 2) right lower lobe pulmonary nodule suspicious for non-small cell lung cancer  PRIOR THERAPY: SBRT to the right lower lobe pulmonary nodule under the care of Dr. Patrcia.   CURRENT THERAPY:  1) Systemic chemotherapy with daratumumab  subcutaneously weekly in addition to Velcade  1.3 Mg/M2 on days 1, 4, 8, 11 every 3 weeks as well as Revlimid  25 mg p.o. daily for 14 days every 3 weeks and Decadron  20 mg p.o. weekly with chemotherapy.  First cycle expected on 12/05/2022.  Status post 12 cycles. Revlimid  has been on hold since June/May 2024. Velcade  being held or cycle #7 due to PN and diarrhea.  2) Zometa  monthly, first dose on 07/03/23   INTERVAL HISTORY: Ronald Duran 83 y.o. male returns to the clinic today for a follow-up visit accompanied by his wife. The patient was last seen in the clinic by Dr. Sherrod on 01/15/24.   The patient is currently on treatment with Darzalex  and Decadron . Velcade  and Revlimid  were discontinued due to side effects, cytopenias, and peripheral neuropathy.   He has stable neuropathy. He is no longer on velcade . He is not interested in medication for this at this time but understands it is available if he desires. Dr. Sherrod offered a treatment break but the patient is not interested in a treatment break.    No fevers or significant infections since his last visit. His appetite remains good, and he occasionally drinks Ensure.     He denies any abnormal bleeding besides normal bruising with his blood thinner.  He was given the option to start eliquis which he is thinking about. He denies any fever, chills, or night sweats.  He denies any signs and symptoms of infection.  Denies any abnormal bleeding. He nausea or  vomiting.  He is here today for evaluation and repeat blood work.   MEDICAL HISTORY: Past Medical History:  Diagnosis Date   Atrial fibrillation (HCC)    CAD (coronary artery disease)    a. s/p MI with LAD stent placement December, 1999 -last stress test November 2008 with no EKG evidence of ischemia  b. cath 07/23/2015 after abnormal ETT and nuc, occluded mid LAD stent with good distal collateral, medical therapy    Cardiomyopathy (HCC)    Ischemic cardiomyopathy with EF 40% by echo 2005   COVID 2021   mild case   Dyslipidemia    Erectile dysfunction    GERD (gastroesophageal reflux disease)    Hiatal hernia    with esophageal strictures and dilatations in the past - Dr. Princella Nida   History of nuclear stress test    Myoview 12/16: EF 47%, anterior, anteroseptal and inferoseptal defect suggesting infarct with peri-infarct ischemia, intermediate risk   HTN (hypertension)    Hx of seasonal allergies    Hyperlipidemia    Myocardial infarction (HCC) 08/01/1998   PAD (peripheral artery disease) (HCC)    Pre-diabetes    Prostate cancer (HCC)    with seed implant therapy following Lupron therapy in 2008 and 2009 - followed by Dr. Alline   Ringworm    Stroke (HCC) 04/20/2017   seen on a MRI   Vitamin D deficiency     ALLERGIES:  has no known allergies.  MEDICATIONS:  Current Outpatient Medications  Medication  Sig Dispense Refill   acyclovir  (ZOVIRAX ) 200 MG capsule Take 1 capsule (200 mg total) by mouth 2 (two) times daily. 60 capsule 5   atorvastatin  (LIPITOR) 40 MG tablet Take 40 mg by mouth at bedtime.     B Complex Vitamins (B COMPLEX 100) tablet Take 1 tablet by mouth daily. Balance     cetirizine  (ZYRTEC  ALLERGY) 10 MG tablet Take 1 tablet (10 mg total) by mouth daily. 30 tablet 0   Cholecalciferol (VITAMIN D3) 2000 UNITS TABS Take 2,000 Units by mouth daily.     ciclopirox  (PENLAC ) 8 % solution Apply topically at bedtime. Apply over nail and surrounding skin. Apply daily  over previous coat. After seven (7) days, may remove with alcohol and continue cycle. 6.6 mL 2   clindamycin (CLEOCIN T) 1 % lotion Apply 1 % topically 2 (two) times daily.     Coenzyme Q10 (CO Q-10) 100 MG CAPS Take 100 mg by mouth daily with supper.     cyanocobalamin (VITAMIN B12) 1000 MCG tablet Take 2,500 mcg by mouth daily.     dexamethasone  (DECADRON ) 4 MG tablet TAKE 5 tabS weekly, start with the first dose of chemotherapy. 40 tablet 3   fluticasone (CUTIVATE) 0.05 % cream Apply 1 Application topically 2 (two) times daily. As needed     furosemide  (LASIX ) 40 MG tablet Take 1 tablet (40 mg total) by mouth 2 (two) times daily. 60 tablet 11   ketoconazole (NIZORAL) 2 % cream Apply 1 Application topically daily as needed for irritation.     metoprolol  tartrate (LOPRESSOR ) 25 MG tablet Take 1 tablet (25 mg total) by mouth 2 (two) times daily. 90 tablet 3   Multiple Vitamins-Minerals (CENTRUM) tablet Take 1 tablet by mouth daily.     nitroGLYCERIN  (NITROSTAT ) 0.4 MG SL tablet PLACE 1 TABLET UNDER THE TONGUE IF NEEDED EVERY 5 MINUTES FOR CHEST PAIN FOR 3 DOSES IF NO RELIEF AFTER FIRST DOSE CALL 911. 25 tablet 9   potassium chloride  SA (KLOR-CON  M) 20 MEQ tablet Take 1 tablet (20 mEq total) by mouth daily. 30 tablet 11   warfarin (COUMADIN ) 5 MG tablet TAKE 1/2 TO 1 TABLET BY MOUTH AS DIRECTED by coumadin  clinic 30 tablet 5   No current facility-administered medications for this visit.    SURGICAL HISTORY:  Past Surgical History:  Procedure Laterality Date   BRONCHIAL BIOPSY  11/15/2022   Procedure: BRONCHIAL BIOPSIES;  Surgeon: Brenna Adine CROME, DO;  Location: [facility];  Service: Pulmonary;;   BRONCHIAL BRUSHINGS  11/15/2022   Procedure: BRONCHIAL BRUSHINGS;  Surgeon: Brenna Adine CROME, DO;  Location: [facility];  Service: Pulmonary;;   BRONCHIAL NEEDLE ASPIRATION BIOPSY  11/15/2022   Procedure: BRONCHIAL NEEDLE ASPIRATION BIOPSIES;  Surgeon: Brenna Adine CROME, DO;  Location: [facility] ENDOSCOPY;   Service: Pulmonary;;   CARDIAC CATHETERIZATION N/A 07/23/2015   Procedure: Left Heart Cath and Coronary Angiography;  Surgeon: Victory LELON Sharps, MD;  Location: [facility] INVASIVE CV LAB;  Service: Cardiovascular;  Laterality: N/A;   CARDIOVERSION N/A 06/03/2022   Procedure: CARDIOVERSION;  Surgeon: Okey Vina GAILS, MD;  Location: [facility] ENDOSCOPY;  Service: Cardiovascular;  Laterality: N/A;   COLONOSCOPY WITH PROPOFOL  N/A 11/24/2015   Procedure: COLONOSCOPY WITH PROPOFOL ;  Surgeon: Gladis MARLA Louder, MD;  Location: [facility] ENDOSCOPY;  Service: Endoscopy;  Laterality: N/A;   esophageal strictures with dilatations in the past per Dr. Princella Nida     last '98   FIDUCIAL MARKER PLACEMENT  11/15/2022   Procedure: FIDUCIAL MARKER PLACEMENT;  Surgeon: Brenna Adine CROME, DO;  Location: [facility] ENDOSCOPY;  Service: Pulmonary;;   HERNIA REPAIR Left    INTRAMEDULLARY (IM) NAIL INTERTROCHANTERIC Left 10/25/2022   Procedure: LEFT INTRAMEDULLARY (IM) NAIL INTERTROCHANTERIC;  Surgeon: Jerri Kay HERO, MD;  Location: [facility] OR;  Service: Orthopedics;  Laterality: Left;   IR BONE MARROW BIOPSY & ASPIRATION  11/17/2022   IR BONE TUMOR(S)RF ABLATION  10/11/2022   IR KYPHO THORACIC WITH BONE BIOPSY  10/11/2022   LAD stent placement - Dr. Victory Sharps     left inguinal hernia repair - Dr. Ozell Gander     prostatic radioactive seed implantation - Dr. Jason     '09    REVIEW OF SYSTEMS:   Review of Systems  Constitutional: Positive for stable fatigue. Negative for appetite change, chills,  fever and unexpected weight change.  HENT: Negative for mouth sores, nosebleeds, sore throat and trouble swallowing.   Eyes: Negative for eye problems and icterus.  Respiratory: Negative for cough, hemoptysis, shortness of breath and wheezing.   Cardiovascular: Negative for chest pain. Positive for stable mild bilateral lower extremity pitting edema (L>R).  Gastrointestinal: Negative for abdominal pain, constipation, diarrhea, nausea and vomiting.  Genitourinary:  Negative for bladder incontinence, difficulty urinating, dysuria, frequency and hematuria.   Musculoskeletal: Positive for occasional chronic back. Negative for gait problem, neck pain and neck stiffness.  Skin: Positive for folliculitis in left neck. Positive for dry skin.  Neurological: Negative for dizziness, extremity weakness, gait problem, headaches, light-headedness and seizures.  Hematological: Negative for adenopathy. Does not bleed easily. Positive for baseline bruising on upper extremities due to blood thinner use.  Psychiatric/Behavioral: Negative for confusion, depression and sleep disturbance. The patient is not nervous/anxious.    PHYSICAL EXAMINATION:  Blood pressure 108/75, pulse (!) 107, temperature (!) 97 F (36.1 C), resp. rate 18, weight 175 lb (79.4 kg), SpO2 96%.  ECOG PERFORMANCE STATUS: 1  Physical Exam  Constitutional: Oriented to person, place, and time and thin appearing male, and in no distress.  HENT:  Head: Normocephalic and atraumatic.  Mouth/Throat: Oropharynx is clear and moist. No oropharyngeal exudate.  Eyes: Conjunctivae are normal. Right eye exhibits no discharge. Left eye exhibits no discharge. No scleral icterus.  Neck: Normal range of motion. Neck supple.  Cardiovascular: Normal rate, regular rhythm, normal heart sounds and intact distal pulses.   Pulmonary/Chest: Effort normal and breath sounds normal. No respiratory distress. No wheezes. No rales.  Abdominal: Soft. Bowel sounds are normal. Exhibits no distension and no mass. There is no tenderness.  Musculoskeletal: Normal range of motion. Mild-moderate left leg pitting edema.   Lymphadenopathy:    No cervical adenopathy.  Neurological: Positive for tremor. Alert and oriented to person, place, and time. Exhibits normal muscle tone. Gait normal. Coordination normal.  Skin: Positive for few scattered skin lesions.  Positive for dry skin on lower extremity.  Positive for bruising.  Skin: Skin is  warm and dry. No rash noted. Not diaphoretic. No erythema. No pallor.  Psychiatric: Mood, memory and judgment normal.  Vitals reviewed.  LABORATORY DATA: Lab Results  Component Value Date   WBC 12.9 (H) 02/12/2024   HGB 14.9 02/12/2024   HCT 44.1 02/12/2024   MCV 99.3 02/12/2024   PLT 126 (L) 02/12/2024      Chemistry      Component Value Date/Time   NA 138 01/15/2024 0856   NA 143 12/27/2023 0000   K 4.1 01/15/2024 0856   CL 105 01/15/2024 0856   CO2 27  01/15/2024 0856   BUN 21 01/15/2024 0856   BUN 20 12/27/2023 0000   CREATININE 1.12 01/15/2024 0856   CREATININE 1.23 (H) 07/21/2015 0917      Component Value Date/Time   CALCIUM  9.3 01/15/2024 0856   ALKPHOS 86 01/15/2024 0856   AST 21 01/15/2024 0856   ALT 32 01/15/2024 0856   BILITOT 1.5 (H) 01/15/2024 0856       RADIOGRAPHIC STUDIES:  ECHOCARDIOGRAM COMPLETE Result Date: 02/08/2024    ECHOCARDIOGRAM REPORT   Patient Name:   Ronald Duran Date of Exam: 02/06/2024 Medical Rec #:  [id number]        Height:       72.0 in Accession #:    [id number]       Weight:       176.2 lb Date of Birth:  Jan 06, 1941       BSA:          2.019 m Patient Age:    82 years         BP:           110/62 mmHg Patient Gender: M                HR:           81 bpm. Exam Location:  [address] Procedure: 2D Echo, Cardiac Doppler, Color Doppler and Intracardiac            Opacification Agent (Both Spectral and Color Flow Doppler were            utilized during procedure). Indications:    I42.0 Dilated cardiomyopathy  History:        Patient has prior history of Echocardiogram examinations, most                 recent 01/25/2023. CHF, Previous Myocardial Infarction and CAD,                 Stroke and H/o apical mural thrombus, Dilated cardiomyopathy,                 Arrythmias:Atrial Fibrillation and Bradycardia; Risk                 Factors:Hypertension, Dyslipidemia and Metastatic cancer.  Sonographer:    Elsie Bohr RDCS Referring Phys: [id number]  MAUDE JAYSON EMMER  Sonographer Comments: Technically difficult study due to poor echo windows. IMPRESSIONS  1. Left ventricular ejection fraction, by estimation, is 30 to 35%. The left ventricle has moderately decreased function. The left ventricle demonstrates global hypokinesis. The left ventricular internal cavity size was moderately dilated. Left ventricular diastolic parameters are indeterminate. There is akinesis of the left ventricular, apical anterior wall, inferior wall, apical segment, inferoseptal wall, anterolateral wall and anteroseptal wall.  2. Right ventricular systolic function was not well visualized. The right ventricular size is normal.  3. Left atrial size was moderately dilated.  4. Right atrial size was mildly dilated.  5. The mitral valve is normal in structure. Moderate mitral valve regurgitation. No evidence of mitral stenosis.  6. The aortic valve has an indeterminant number of cusps. There is mild calcification of the aortic valve. Aortic valve regurgitation is mild. Aortic valve sclerosis is present, with no evidence of aortic valve stenosis. Comparison(s): Prior images reviewed side by side. Changes from prior study are noted. EF now 30-35%, apical akinesis noted previously (dates to 2016), moderate MR, mild AR now present. FINDINGS  Left Ventricle: Global hypokinesis with apical akinesis (noted previously), no LV  thrombus visualized. Left ventricular ejection fraction, by estimation, is 30 to 35%. The left ventricle has moderately decreased function. The left ventricle demonstrates  global hypokinesis. Definity  contrast agent was given IV to delineate the left ventricular endocardial borders. The left ventricular internal cavity size was moderately dilated. There is no left ventricular hypertrophy. Left ventricular diastolic parameters are indeterminate. Right Ventricle: The right ventricular size is normal. Right vetricular wall thickness was not well visualized. Right ventricular  systolic function was not well visualized. Left Atrium: Left atrial size was moderately dilated. Right Atrium: Right atrial size was mildly dilated. Pericardium: There is no evidence of pericardial effusion. Mitral Valve: The mitral valve is normal in structure. Moderate mitral valve regurgitation. No evidence of mitral valve stenosis. Tricuspid Valve: The tricuspid valve is normal in structure. Tricuspid valve regurgitation is mild . No evidence of tricuspid stenosis. Aortic Valve: The aortic valve has an indeterminant number of cusps. There is mild calcification of the aortic valve. Aortic valve regurgitation is mild. Aortic regurgitation PHT measures 570 msec. Aortic valve sclerosis is present, with no evidence of aortic valve stenosis. Pulmonic Valve: The pulmonic valve was grossly normal. Pulmonic valve regurgitation is not visualized. No evidence of pulmonic stenosis. Aorta: The aortic root was not well visualized and the aortic root, ascending aorta and aortic arch are all structurally normal, with no evidence of dilitation or obstruction. Venous: The inferior vena cava was not well visualized. IAS/Shunts: The atrial septum is grossly normal.  LEFT VENTRICLE PLAX 2D LVIDd:         5.90 cm   Diastology LVIDs:         4.90 cm   LV e' medial:    7.03 cm/s LV PW:         1.20 cm   LV E/e' medial:  14.8 LV IVS:        1.00 cm   LV e' lateral:   9.81 cm/s LVOT diam:     1.80 cm   LV E/e' lateral: 10.6 LV SV:         31 LV SV Index:   16 LVOT Area:     2.54 cm  RIGHT VENTRICLE RVSP:           28.6 mmHg LEFT ATRIUM             Index        RIGHT ATRIUM           Index LA diam:        5.20 cm 2.58 cm/m   RA Pressure: 3.00 mmHg LA Vol (A2C):   84.3 ml 41.76 ml/m  RA Area:     19.60 cm LA Vol (A4C):   72.8 ml 36.06 ml/m  RA Volume:   58.20 ml  28.83 ml/m LA Biplane Vol: 77.7 ml 38.49 ml/m  AORTIC VALVE LVOT Vmax:   83.96 cm/s LVOT Vmean:  53.880 cm/s LVOT VTI:    0.123 m AI PHT:      570 msec  AORTA Ao Root diam:  3.20 cm Ao Asc diam:  3.50 cm MITRAL VALVE                TRICUSPID VALVE MV Area (PHT): 4.01 cm     TR Peak grad:   25.6 mmHg MV Decel Time: 189 msec     TR Vmax:        253.00 cm/s MV E velocity: 104.10 cm/s  Estimated RAP:  3.00 mmHg  RVSP:           28.6 mmHg                              SHUNTS                             Systemic VTI:  0.12 m                             Systemic Diam: 1.80 cm Shelda Bruckner MD Electronically signed by Shelda Bruckner MD Signature Date/Time: 02/08/2024/9:19:27 PM    Final      ASSESSMENT/PLAN:  This is a very pleasant 83 year old Caucasian male with multiple myeloma presented with multiple lytic lesions involving T10, left femoral neck as well as T4, T9 and T11 vertebral bodies.    He also had right lower lobe lung nodule that was suspicious for non-small cell lung cancer.    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    he biopsy from the left hip was negative for malignancy. His myeloma panel showed no concerning findings except for elevated IgG but the serum light chains were normal. The patient underwent left hip fixation for impending fracture. He also underwent palliative radiotherapy under the care of Dr. Patrcia. His recent bone marrow biopsy and aspirate was consistent with plasma cell neoplasm with involvement of plasma cells 25% of the bone marrow.   The patient is currently on treatment with daratumumab , Velcade , Revlimid  and dexamethasone  started on 12/05/2022 status post 17 cycle. The patient has had treatment held periodically due to thrombocytopenia.  His dose of Revlimid  was reduced to 15 milligrams due to thrombocytopenia.  However, his Revlimid  was ultimately held starting from May/June 2024.    Velcade  was discontinued as the patient was recovering from diarrhea, hypotension, and dizziness.   He also was developing peripheral neuropathy.   Labs were reviewed.  Recommend he continue with dara every 4 weeks as scheduled today. He will proceed with treatment today as scheduled.    Will continue to receive Zometa  monthly.   We will see him back for a follow-up visit in 4 weeks before his next cycle of treatment.      Lung cancer Previously treated with SBRT. No symptoms of recurrence. Surveillance CT scans essential. - Order surveillance CT scan in three weeks before next appointment.   Edema Edema with leakage likely due to fluid retention. Cardiologist recommended Lasix  and weight loss. - Continue Lasix  as prescribed. - Monitor weight daily.   Easy bruising Easy bruising likely related to warfarin.  -They also discussed possibly changing to eliquis which he is considering.   The patient is not interested in starting medication for neuropathy at this time but knows it is available if needed.   The patient was advised to call immediately if she has any concerning symptoms in the interval. The patient voices understanding of current disease status and treatment options and is in agreement with the current care plan. All questions were answered. The patient knows to call the clinic with any problems, questions or concerns. We can certainly see the patient much sooner if necessary     No orders of the defined types were placed in this encounter.  The total time spent in the appointment was 20-29  Kailene Steinhart L Eller Sweis, PA-C 02/12/24

## 2024-02-12 ENCOUNTER — Inpatient Hospital Stay (HOSPITAL_BASED_OUTPATIENT_CLINIC_OR_DEPARTMENT_OTHER): Attending: Internal Medicine | Admitting: Physician Assistant

## 2024-02-12 ENCOUNTER — Inpatient Hospital Stay: Attending: Internal Medicine

## 2024-02-12 ENCOUNTER — Inpatient Hospital Stay

## 2024-02-12 VITALS — BP 108/75 | HR 107 | Temp 97.0°F | Resp 18 | Wt 175.0 lb

## 2024-02-12 DIAGNOSIS — C9000 Multiple myeloma not having achieved remission: Secondary | ICD-10-CM

## 2024-02-12 DIAGNOSIS — Z5112 Encounter for antineoplastic immunotherapy: Secondary | ICD-10-CM | POA: Diagnosis present

## 2024-02-12 DIAGNOSIS — Z79620 Long term (current) use of immunosuppressive biologic: Secondary | ICD-10-CM | POA: Diagnosis not present

## 2024-02-12 DIAGNOSIS — C3491 Malignant neoplasm of unspecified part of right bronchus or lung: Secondary | ICD-10-CM | POA: Diagnosis not present

## 2024-02-12 LAB — CBC WITH DIFFERENTIAL (CANCER CENTER ONLY)
Abs Immature Granulocytes: 0.06 K/uL (ref 0.00–0.07)
Basophils Absolute: 0.1 K/uL (ref 0.0–0.1)
Basophils Relative: 1 %
Eosinophils Absolute: 0.2 K/uL (ref 0.0–0.5)
Eosinophils Relative: 2 %
HCT: 44.1 % (ref 39.0–52.0)
Hemoglobin: 14.9 g/dL (ref 13.0–17.0)
Immature Granulocytes: 1 %
Lymphocytes Relative: 3 %
Lymphs Abs: 0.4 K/uL — ABNORMAL LOW (ref 0.7–4.0)
MCH: 33.6 pg (ref 26.0–34.0)
MCHC: 33.8 g/dL (ref 30.0–36.0)
MCV: 99.3 fL (ref 80.0–100.0)
Monocytes Absolute: 0.6 K/uL (ref 0.1–1.0)
Monocytes Relative: 5 %
Neutro Abs: 11.6 K/uL — ABNORMAL HIGH (ref 1.7–7.7)
Neutrophils Relative %: 88 %
Platelet Count: 126 K/uL — ABNORMAL LOW (ref 150–400)
RBC: 4.44 MIL/uL (ref 4.22–5.81)
RDW: 14.7 % (ref 11.5–15.5)
WBC Count: 12.9 K/uL — ABNORMAL HIGH (ref 4.0–10.5)
nRBC: 0 % (ref 0.0–0.2)

## 2024-02-12 LAB — CMP (CANCER CENTER ONLY)
ALT: 32 U/L (ref 0–44)
AST: 21 U/L (ref 15–41)
Albumin: 3.6 g/dL (ref 3.5–5.0)
Alkaline Phosphatase: 82 U/L (ref 38–126)
Anion gap: 7 (ref 5–15)
BUN: 20 mg/dL (ref 8–23)
CO2: 29 mmol/L (ref 22–32)
Calcium: 10.1 mg/dL (ref 8.9–10.3)
Chloride: 104 mmol/L (ref 98–111)
Creatinine: 1.17 mg/dL (ref 0.61–1.24)
GFR, Estimated: >60 mL/min (ref >60)
Glucose, Bld: 125 mg/dL — ABNORMAL HIGH (ref 70–99)
Potassium: 3.9 mmol/L (ref 3.5–5.1)
Sodium: 140 mmol/L (ref 135–145)
Total Bilirubin: 1 mg/dL (ref 0.0–1.2)
Total Protein: 5.8 g/dL — ABNORMAL LOW (ref 6.5–8.1)

## 2024-02-12 MED ORDER — DIPHENHYDRAMINE HCL 25 MG PO CAPS
50.0000 mg | ORAL_CAPSULE | Freq: Once | ORAL | Status: AC
Start: 2024-02-12 — End: 2024-02-12
  Administered 2024-02-12: 50 mg via ORAL
  Filled 2024-02-12: qty 2

## 2024-02-12 MED ORDER — SODIUM CHLORIDE 0.9% FLUSH
10.0000 mL | Freq: Once | INTRAVENOUS | Status: DC | PRN
Start: 2024-02-12 — End: 2024-02-12

## 2024-02-12 MED ORDER — ZOLEDRONIC ACID 4 MG/100ML IV SOLN
4.0000 mg | Freq: Once | INTRAVENOUS | Status: AC
  Administered 2024-02-12: 4 mg via INTRAVENOUS
  Filled 2024-02-12: qty 100

## 2024-02-12 MED ORDER — ACETAMINOPHEN 325 MG PO TABS
650.0000 mg | ORAL_TABLET | Freq: Once | ORAL | Status: AC
  Administered 2024-02-12: 650 mg via ORAL
  Filled 2024-02-12: qty 2

## 2024-02-12 MED ORDER — DARATUMUMAB-HYALURONIDASE-FIHJ 1800-30000 MG-UT/15ML ~~LOC~~ SOLN
1800.0000 mg | Freq: Once | SUBCUTANEOUS | Status: AC
  Administered 2024-02-12: 1800 mg via SUBCUTANEOUS
  Filled 2024-02-12: qty 15

## 2024-02-12 NOTE — Patient Instructions (Signed)
 [facility] - A DEPT OF [facility]   Discharge Instructions: Thank you for choosing [facility] to provide your oncology and hematology care.   If you have a lab appointment with the Cancer Center, please go directly to the Cancer Center and check in at the registration area.   Wear comfortable clothing and clothing appropriate for easy access to any Portacath or PICC line.   We strive to give you quality time with your provider. You may need to reschedule your appointment if you arrive late (15 or more minutes).  Arriving late affects you and other patients whose appointments are after yours.  Also, if you miss three or more appointments without notifying the office, you may be dismissed from the clinic at the provider's discretion.      For prescription refill requests, have your pharmacy contact our office and allow 72 hours for refills to be completed.    Today you received the following chemotherapy and/or immunotherapy agents: Daratumumab hyaluronidase (Darzalex faspro)      To help prevent nausea and vomiting after your treatment, we encourage you to take your nausea medication as directed.  BELOW ARE SYMPTOMS THAT SHOULD BE REPORTED IMMEDIATELY: *FEVER GREATER THAN 100.4 F (38 C) OR HIGHER *CHILLS OR SWEATING *NAUSEA AND VOMITING THAT IS NOT CONTROLLED WITH YOUR NAUSEA MEDICATION *UNUSUAL SHORTNESS OF BREATH *UNUSUAL BRUISING OR BLEEDING *URINARY PROBLEMS (pain or burning when urinating, or frequent urination) *BOWEL PROBLEMS (unusual diarrhea, constipation, pain near the anus) TENDERNESS IN MOUTH AND THROAT WITH OR WITHOUT PRESENCE OF ULCERS (sore throat, sores in mouth, or a toothache) UNUSUAL RASH, SWELLING OR PAIN  UNUSUAL VAGINAL DISCHARGE OR ITCHING   Items with * indicate a potential emergency and should be followed up as soon as possible or go to the Emergency Department if any problems should occur.  Please show the  CHEMOTHERAPY ALERT CARD or IMMUNOTHERAPY ALERT CARD at check-in to the Emergency Department and triage nurse.  Should you have questions after your visit or need to cancel or reschedule your appointment, please contact [facility] - A DEPT OF [facility]  Dept: [phone]  and follow the prompts.  Office hours are 8:00 a.m. to 4:30 p.m. Monday - Friday. Please note that voicemails left after 4:00 p.m. may not be returned until the following business day.  We are closed weekends and major holidays. You have access to a nurse at all times for urgent questions. Please call the main number to the clinic Dept: [phone] and follow the prompts.   For any non-urgent questions, you may also contact your provider using MyChart. We now offer e-Visits for anyone 60 and older to request care online for non-urgent symptoms. For details visit [email or web address].   Also download the MyChart app! Go to the app store, search "MyChart", open the app, select [facility], and log in with your MyChart username and password.

## 2024-02-13 ENCOUNTER — Other Ambulatory Visit: Payer: Self-pay

## 2024-02-15 ENCOUNTER — Ambulatory Visit: Attending: Cardiovascular Disease | Admitting: *Deleted

## 2024-02-15 DIAGNOSIS — Z79899 Other long term (current) drug therapy: Secondary | ICD-10-CM | POA: Diagnosis not present

## 2024-02-15 DIAGNOSIS — Z5181 Encounter for therapeutic drug level monitoring: Secondary | ICD-10-CM

## 2024-02-15 DIAGNOSIS — I4819 Other persistent atrial fibrillation: Secondary | ICD-10-CM | POA: Diagnosis not present

## 2024-02-15 DIAGNOSIS — I6349 Cerebral infarction due to embolism of other cerebral artery: Secondary | ICD-10-CM

## 2024-02-15 LAB — POCT INR: POC INR: 2.7

## 2024-02-15 NOTE — Patient Instructions (Addendum)
 Description   Continue taking warfarin 1/2 tablet daily EXCEPT 1 tablet on Sundays, Tuesdays and Thursdays.  Be consistent with your greens.  Recheck INR 4 weeks.  Anticoagulation [phone].

## 2024-02-15 NOTE — Progress Notes (Signed)
Please see anticoagulation encounter.

## 2024-03-12 ENCOUNTER — Other Ambulatory Visit: Payer: Self-pay | Admitting: Internal Medicine

## 2024-03-12 ENCOUNTER — Inpatient Hospital Stay

## 2024-03-12 ENCOUNTER — Inpatient Hospital Stay (HOSPITAL_BASED_OUTPATIENT_CLINIC_OR_DEPARTMENT_OTHER): Admitting: Internal Medicine

## 2024-03-12 ENCOUNTER — Inpatient Hospital Stay: Attending: Internal Medicine

## 2024-03-12 VITALS — BP 106/79 | HR 87 | Temp 97.2°F | Resp 16 | Ht 72.0 in | Wt 175.0 lb

## 2024-03-12 DIAGNOSIS — C9000 Multiple myeloma not having achieved remission: Secondary | ICD-10-CM

## 2024-03-12 DIAGNOSIS — Z5112 Encounter for antineoplastic immunotherapy: Secondary | ICD-10-CM | POA: Insufficient documentation

## 2024-03-12 DIAGNOSIS — Z79620 Long term (current) use of immunosuppressive biologic: Secondary | ICD-10-CM | POA: Diagnosis not present

## 2024-03-12 LAB — CMP (CANCER CENTER ONLY)
ALT: 24 U/L (ref 0–44)
AST: 19 U/L (ref 15–41)
Albumin: 3.7 g/dL (ref 3.5–5.0)
Alkaline Phosphatase: 84 U/L (ref 38–126)
Anion gap: 6 (ref 5–15)
BUN: 20 mg/dL (ref 8–23)
CO2: 30 mmol/L (ref 22–32)
Calcium: 9.5 mg/dL (ref 8.9–10.3)
Chloride: 104 mmol/L (ref 98–111)
Creatinine: 1.15 mg/dL (ref 0.61–1.24)
GFR, Estimated: >60 mL/min (ref >60)
Glucose, Bld: 112 mg/dL — ABNORMAL HIGH (ref 70–99)
Potassium: 4 mmol/L (ref 3.5–5.1)
Sodium: 140 mmol/L (ref 135–145)
Total Bilirubin: 1.3 mg/dL — ABNORMAL HIGH (ref 0.0–1.2)
Total Protein: 5.8 g/dL — ABNORMAL LOW (ref 6.5–8.1)

## 2024-03-12 LAB — CBC WITH DIFFERENTIAL (CANCER CENTER ONLY)
Abs Immature Granulocytes: 0.05 K/uL (ref 0.00–0.07)
Basophils Absolute: 0.1 K/uL (ref 0.0–0.1)
Basophils Relative: 1 %
Eosinophils Absolute: 0.2 K/uL (ref 0.0–0.5)
Eosinophils Relative: 2 %
HCT: 42.5 % (ref 39.0–52.0)
Hemoglobin: 14.3 g/dL (ref 13.0–17.0)
Immature Granulocytes: 1 %
Lymphocytes Relative: 3 %
Lymphs Abs: 0.3 K/uL — ABNORMAL LOW (ref 0.7–4.0)
MCH: 33.3 pg (ref 26.0–34.0)
MCHC: 33.6 g/dL (ref 30.0–36.0)
MCV: 98.8 fL (ref 80.0–100.0)
Monocytes Absolute: 0.4 K/uL (ref 0.1–1.0)
Monocytes Relative: 4 %
Neutro Abs: 10 K/uL — ABNORMAL HIGH (ref 1.7–7.7)
Neutrophils Relative %: 89 %
Platelet Count: 129 K/uL — ABNORMAL LOW (ref 150–400)
RBC: 4.3 MIL/uL (ref 4.22–5.81)
RDW: 15.1 % (ref 11.5–15.5)
WBC Count: 11 K/uL — ABNORMAL HIGH (ref 4.0–10.5)
nRBC: 0 % (ref 0.0–0.2)

## 2024-03-12 MED ORDER — ZOLEDRONIC ACID 4 MG/100ML IV SOLN
4.0000 mg | Freq: Once | INTRAVENOUS | Status: AC
  Administered 2024-03-12: 4 mg via INTRAVENOUS
  Filled 2024-03-12: qty 100

## 2024-03-12 MED ORDER — ACETAMINOPHEN 325 MG PO TABS
650.0000 mg | ORAL_TABLET | Freq: Once | ORAL | Status: AC
  Administered 2024-03-12: 650 mg via ORAL
  Filled 2024-03-12: qty 2

## 2024-03-12 MED ORDER — SODIUM CHLORIDE 0.9 % IV SOLN: INTRAVENOUS | Status: DC

## 2024-03-12 MED ORDER — DARATUMUMAB-HYALURONIDASE-FIHJ 1800-30000 MG-UT/15ML ~~LOC~~ SOLN
1800.0000 mg | Freq: Once | SUBCUTANEOUS | Status: AC
  Administered 2024-03-12: 1800 mg via SUBCUTANEOUS
  Filled 2024-03-12: qty 15

## 2024-03-12 MED ORDER — DIPHENHYDRAMINE HCL 25 MG PO CAPS
50.0000 mg | ORAL_CAPSULE | Freq: Once | ORAL | Status: AC
  Administered 2024-03-12: 50 mg via ORAL
  Filled 2024-03-12: qty 2

## 2024-03-12 NOTE — Progress Notes (Addendum)
 [facility] Telephone:([phone]   [phone]  OFFICE PROGRESS NOTE  Kip Righter, MD [address] [place] [address]  DIAGNOSIS:  1) Multiple myeloma, presented with plasmacytoma as well as other lytic lesions in the bone diagnosed in March 2024 2) right lower lobe pulmonary nodule suspicious for non-small cell lung cancer  PRIOR THERAPY: SBRT to the right lower lobe pulmonary nodule under the care of Dr. Patrcia.  CURRENT THERAPY:  1) Systemic chemotherapy with daratumumab  subcutaneously weekly in addition to Velcade  1.3 Mg/M2 on days 1, 4, 8, 11 every 3 weeks as well as Revlimid  25 mg p.o. daily for 14 days every 3 weeks and Decadron  20 mg p.o. weekly with chemotherapy.  First cycle expected on 12/05/2022.  Status post 18 cycles.  Velcade  and Revlimid  were discontinued secondary to significant peripheral neuropathy and pancytopenia.  He is currently on maintenance treatment with daratumumab  every 4 weeks in addition to Decadron  20 mg p.o. on weekly basis. 2) Zometa  4 mg IV infusion every 4 weeks.  Starting March 12, 2024 Zometa  will be giving every 12 weeks  INTERVAL HISTORY: Ronald Duran 83 y.o. male returns to the clinic today for follow-up visit accompanied by his wife. Discussed the use of AI scribe software for clinical note transcription with the patient, who gave verbal consent to proceed.  History of Present Illness Ronald Duran is an 83 year old male with multiple myeloma who presents for evaluation before starting cycle 19 of treatment. He is accompanied by his wife.  He was diagnosed with multiple myeloma in March 2024 and is currently undergoing treatment with daratumumab  subcutaneously and Decadron .  Neuropathy is his most significant symptom, though he experiences no pain associated with his myeloma. No nausea, vomiting, or bleeding. His diet remains stable without issues.  Previously, he experienced nausea  while on Revlimid  and Velcade , but these medications were discontinued, and he has not had nausea since. He is also on Lasix , prescribed by cardiology, which has helped reduce swelling and contributed to some weight loss.    MEDICAL HISTORY: Past Medical History:  Diagnosis Date   Atrial fibrillation (HCC)    CAD (coronary artery disease)    a. s/p MI with LAD stent placement December, 1999 -last stress test November 2008 with no EKG evidence of ischemia  b. cath 07/23/2015 after abnormal ETT and nuc, occluded mid LAD stent with good distal collateral, medical therapy    Cardiomyopathy (HCC)    Ischemic cardiomyopathy with EF 40% by echo 2005   COVID 2021   mild case   Dyslipidemia    Erectile dysfunction    GERD (gastroesophageal reflux disease)    Hiatal hernia    with esophageal strictures and dilatations in the past - Dr. Princella Nida   History of nuclear stress test    Myoview 12/16: EF 47%, anterior, anteroseptal and inferoseptal defect suggesting infarct with peri-infarct ischemia, intermediate risk   HTN (hypertension)    Hx of seasonal allergies    Hyperlipidemia    Myocardial infarction (HCC) 08/01/1998   PAD (peripheral artery disease) (HCC)    Pre-diabetes    Prostate cancer (HCC)    with seed implant therapy following Lupron therapy in 2008 and 2009 - followed by Dr. Alline   Ringworm    Stroke (HCC) 04/20/2017   seen on a MRI   Vitamin D deficiency     ALLERGIES:  has no known allergies.  MEDICATIONS:  Current Outpatient Medications  Medication Sig Dispense Refill   acyclovir  (ZOVIRAX ) 200 MG capsule Take 1 capsule (200 mg total) by mouth 2 (two) times daily. 60 capsule 5   atorvastatin  (LIPITOR) 40 MG tablet Take 40 mg by mouth at bedtime.     B Complex Vitamins (B COMPLEX 100) tablet Take 1 tablet by mouth daily. Balance     cetirizine  (ZYRTEC  ALLERGY) 10 MG tablet Take 1 tablet (10 mg total) by mouth daily. 30 tablet 0   Cholecalciferol (VITAMIN D3) 2000  UNITS TABS Take 2,000 Units by mouth daily.     ciclopirox  (PENLAC ) 8 % solution Apply topically at bedtime. Apply over nail and surrounding skin. Apply daily over previous coat. After seven (7) days, may remove with alcohol and continue cycle. 6.6 mL 2   clindamycin (CLEOCIN T) 1 % lotion Apply 1 % topically 2 (two) times daily.     Coenzyme Q10 (CO Q-10) 100 MG CAPS Take 100 mg by mouth daily with supper.     cyanocobalamin (VITAMIN B12) 1000 MCG tablet Take 2,500 mcg by mouth daily.     dexamethasone  (DECADRON ) 4 MG tablet TAKE 5 tabS weekly, start with the first dose of chemotherapy. 40 tablet 3   fluticasone (CUTIVATE) 0.05 % cream Apply 1 Application topically 2 (two) times daily. As needed     furosemide  (LASIX ) 40 MG tablet Take 1 tablet (40 mg total) by mouth 2 (two) times daily. 60 tablet 11   ketoconazole (NIZORAL) 2 % cream Apply 1 Application topically daily as needed for irritation.     metoprolol  tartrate (LOPRESSOR ) 25 MG tablet Take 1 tablet (25 mg total) by mouth 2 (two) times daily. 90 tablet 3   Multiple Vitamins-Minerals (CENTRUM) tablet Take 1 tablet by mouth daily.     nitroGLYCERIN  (NITROSTAT ) 0.4 MG SL tablet PLACE 1 TABLET UNDER THE TONGUE IF NEEDED EVERY 5 MINUTES FOR CHEST PAIN FOR 3 DOSES IF NO RELIEF AFTER FIRST DOSE CALL 911. 25 tablet 9   potassium chloride  SA (KLOR-CON  M) 20 MEQ tablet Take 1 tablet (20 mEq total) by mouth daily. 30 tablet 11   warfarin (COUMADIN ) 5 MG tablet TAKE 1/2 TO 1 TABLET BY MOUTH AS DIRECTED by coumadin  clinic 30 tablet 5   No current facility-administered medications for this visit.    SURGICAL HISTORY:  Past Surgical History:  Procedure Laterality Date   BRONCHIAL BIOPSY  11/15/2022   Procedure: BRONCHIAL BIOPSIES;  Surgeon: Brenna Adine CROME, DO;  Location: [facility];  Service: Pulmonary;;   BRONCHIAL BRUSHINGS  11/15/2022   Procedure: BRONCHIAL BRUSHINGS;  Surgeon: Brenna Adine CROME, DO;  Location: [facility];  Service: Pulmonary;;    BRONCHIAL NEEDLE ASPIRATION BIOPSY  11/15/2022   Procedure: BRONCHIAL NEEDLE ASPIRATION BIOPSIES;  Surgeon: Brenna Adine CROME, DO;  Location: [facility]OS[facility];  Service: Pulmonary;;   CARDIAC CATHETERIZATION N/A 07/23/2015   Procedure: Left Heart Cath and Coronary Angiography;  Surgeon: Victory LELON Sharps, MD;  Location: [facility] INVASIVE CV LAB;  Service: Cardiovascular;  Laterality: N/A;   CARDIOVERSION N/A 06/03/2022   Procedure: CARDIOVERSION;  Surgeon: Okey Vina GAILS, MD;  Location: [facility] ENDOSCOPY;  Service: Cardiovascular;  Laterality: N/A;   COLONOSCOPY WITH PROPOFOL  N/A 11/24/2015   Procedure: COLONOSCOPY WITH PROPOFOL ;  Surgeon: Gladis MARLA Louder, MD;  Location: [facility] ENDOSCOPY;  Service: Endoscopy;  Laterality: N/A;   esophageal strictures with dilatations in the past per Dr. Princella Nida     last '98   FIDUCIAL MARKER PLACEMENT  11/15/2022  Procedure: FIDUCIAL MARKER PLACEMENT;  Surgeon: Brenna Adine CROME, DO;  Location: [facility];  Service: Pulmonary;;   HERNIA REPAIR Left    INTRAMEDULLARY (IM) NAIL INTERTROCHANTERIC Left 10/25/2022   Procedure: LEFT INTRAMEDULLARY (IM) NAIL INTERTROCHANTERIC;  Surgeon: Jerri Kay HERO, MD;  Location: [facility] OR;  Service: Orthopedics;  Laterality: Left;   IR BONE MARROW BIOPSY & ASPIRATION  11/17/2022   IR BONE TUMOR(S)RF ABLATION  10/11/2022   IR KYPHO THORACIC WITH BONE BIOPSY  10/11/2022   LAD stent placement - Dr. Victory Sharps     left inguinal hernia repair - Dr. Ozell Gander     prostatic radioactive seed implantation - Dr. Jason     '09    REVIEW OF SYSTEMS:  A comprehensive review of systems was negative except for: Constitutional: positive for fatigue Neurological: positive for paresthesia   PHYSICAL EXAMINATION: General appearance: alert, cooperative, fatigued, and no distress Head: Normocephalic, without obvious abnormality, atraumatic Neck: no adenopathy, no JVD, supple, symmetrical, trachea midline, and thyroid not enlarged, symmetric, no  tenderness/mass/nodules Lymph nodes: Cervical, supraclavicular, and axillary nodes normal. Resp: clear to auscultation bilaterally Back: symmetric, no curvature. ROM normal. No CVA tenderness. Cardio: irregularly irregular rhythm GI: soft, non-tender; bowel sounds normal; no masses,  no organomegaly Extremities: extremities normal, atraumatic, no cyanosis or edema  ECOG PERFORMANCE STATUS: 1 - Symptomatic but completely ambulatory  Blood pressure 106/79, pulse 87, temperature (!) 97.2 F (36.2 C), temperature source Temporal, resp. rate 16, height 6' (1.829 m), weight 175 lb (79.4 kg), SpO2 98%.  LABORATORY DATA: Lab Results  Component Value Date   WBC 11.0 (H) 03/12/2024   HGB 14.3 03/12/2024   HCT 42.5 03/12/2024   MCV 98.8 03/12/2024   PLT 129 (L) 03/12/2024      Chemistry      Component Value Date/Time   NA 140 02/12/2024 0836   NA 143 12/27/2023 0000   K 3.9 02/12/2024 0836   CL 104 02/12/2024 0836   CO2 29 02/12/2024 0836   BUN 20 02/12/2024 0836   BUN 20 12/27/2023 0000   CREATININE 1.17 02/12/2024 0836   CREATININE 1.23 (H) 07/21/2015 0917      Component Value Date/Time   CALCIUM  10.1 02/12/2024 0836   ALKPHOS 82 02/12/2024 0836   AST 21 02/12/2024 0836   ALT 32 02/12/2024 0836   BILITOT 1.0 02/12/2024 0836       RADIOGRAPHIC STUDIES: No results found.   ASSESSMENT AND PLAN: This is a very pleasant 83 years old white male with multiple myeloma presented with multiple lytic lesions involving T10, left femoral neck as well as T4, T9 and T11 vertebral bodies. Also had right lower lobe lung nodule that was suspicious for non-small cell lung cancer. 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 The biopsy from the left hip was negative for malignancy. His myeloma panel showed no concerning findings except for elevated IgG but the serum light chains  were normal. The patient underwent left hip fixation for impending fracture. He also underwent palliative radiotherapy under the care of Dr. Patrcia. His recent bone marrow biopsy and aspirate was consistent with plasma cell neoplasm with involvement of plasma cells 25% of the bone marrow. The patient is currently on treatment with daratumumab  and dexamethasone  started on 12/05/2022 status post 18 cycles.  Velcade  and Revlimid  were discontinued secondary to adverse effect and intolerance.  He has been tolerating his  treatment with subcutaneous daratumumab  and Decadron  fairly well.   For the suspicious right lower lobe lung cancer, he underwent SBRT under the care of Dr. Patrcia. Assessment and Plan Assessment & Plan Multiple myeloma on active treatment Multiple myeloma diagnosed in March 2024, currently on daratumumab  and Decadron . Post eighteen cycles, he presents for evaluation before cycle nineteen. Reports no significant symptoms related to myeloma, including no pain, nausea, vomiting, or bleeding. Previous nausea from Revlimid  and Velcade  resolved after discontinuation. Lasix  prescribed by cardiology has reduced swelling and contributed to weight loss. - Proceed with cycle nineteen of daratumumab  and Decadron  treatment - Order myeloma panel one week before next visit  Peripheral neuropathy Reports neuropathy as the most significant symptom currently experienced. He was advised to call immediately if he has any concerning symptoms in the interval.  The patient voices understanding of current disease status and treatment options and is in agreement with the current care plan.  All questions were answered. The patient knows to call the clinic with any problems, questions or concerns. We can certainly see the patient much sooner if necessary. The total time spent in the appointment was 20 minutes including review of chart and various tests results, discussions about plan of care and coordination  of care plan .   Disclaimer: This note was dictated with voice recognition software. Similar sounding words can inadvertently be transcribed and may not be corrected upon review.

## 2024-03-12 NOTE — Patient Instructions (Signed)

## 2024-03-14 ENCOUNTER — Ambulatory Visit: Attending: Cardiology | Admitting: *Deleted

## 2024-03-14 ENCOUNTER — Other Ambulatory Visit (HOSPITAL_COMMUNITY): Payer: Self-pay

## 2024-03-14 DIAGNOSIS — I4819 Other persistent atrial fibrillation: Secondary | ICD-10-CM | POA: Diagnosis not present

## 2024-03-14 DIAGNOSIS — Z79899 Other long term (current) drug therapy: Secondary | ICD-10-CM

## 2024-03-14 DIAGNOSIS — I6349 Cerebral infarction due to embolism of other cerebral artery: Secondary | ICD-10-CM | POA: Diagnosis not present

## 2024-03-14 LAB — POCT INR: POC INR: 5

## 2024-03-14 NOTE — Progress Notes (Signed)
 INR 5.0  Please see anticoagulation encounter

## 2024-03-14 NOTE — Patient Instructions (Addendum)
 Description   Called and spoke to pt and instructed him to hold warfarin 8/7, 8/8 and 8/9  Take 1/2 a tablet of warfarin on Sunday.  Recheck INR on Monday 8/11. If Inr is less then 2 pt can switch to Eliquis.  Anticoagulation Clinic-[phone].

## 2024-03-18 ENCOUNTER — Ambulatory Visit: Attending: Internal Medicine

## 2024-03-18 ENCOUNTER — Ambulatory Visit: Admitting: Podiatry

## 2024-03-18 ENCOUNTER — Other Ambulatory Visit (HOSPITAL_COMMUNITY): Payer: Self-pay

## 2024-03-18 ENCOUNTER — Other Ambulatory Visit: Payer: Self-pay

## 2024-03-18 ENCOUNTER — Encounter: Payer: Self-pay | Admitting: Internal Medicine

## 2024-03-18 DIAGNOSIS — M79675 Pain in left toe(s): Secondary | ICD-10-CM

## 2024-03-18 DIAGNOSIS — L84 Corns and callosities: Secondary | ICD-10-CM | POA: Diagnosis not present

## 2024-03-18 DIAGNOSIS — I4819 Other persistent atrial fibrillation: Secondary | ICD-10-CM

## 2024-03-18 DIAGNOSIS — I6349 Cerebral infarction due to embolism of other cerebral artery: Secondary | ICD-10-CM | POA: Diagnosis not present

## 2024-03-18 DIAGNOSIS — M79674 Pain in right toe(s): Secondary | ICD-10-CM | POA: Diagnosis not present

## 2024-03-18 DIAGNOSIS — Z7901 Long term (current) use of anticoagulants: Secondary | ICD-10-CM

## 2024-03-18 DIAGNOSIS — B351 Tinea unguium: Secondary | ICD-10-CM | POA: Diagnosis not present

## 2024-03-18 DIAGNOSIS — Z79899 Other long term (current) drug therapy: Secondary | ICD-10-CM | POA: Diagnosis not present

## 2024-03-18 LAB — POCT INR: INR: 1.3 — AB (ref 2.0–3.0)

## 2024-03-18 MED ORDER — APIXABAN 5 MG PO TABS
5.0000 mg | ORAL_TABLET | Freq: Two times a day (BID) | ORAL | 0 refills | Status: AC
  Filled 2024-03-18: qty 60, 30d supply, fill #0

## 2024-03-18 NOTE — Progress Notes (Signed)
 INR 1.3 Please see anticoagulation encounter

## 2024-03-18 NOTE — Patient Instructions (Signed)
 Start Eliquis  5 mg bid 8/12 in the morning. Anticoagulation [phone].

## 2024-03-18 NOTE — Patient Instructions (Signed)
 Use a small amount of antibiotic ointment on the right foot under the big toe and cover with a bandage.  Monitor for any signs/symptoms of infection. Call the office immediately if any occur or go directly to the emergency room. Call with any questions/concerns.

## 2024-03-18 NOTE — Progress Notes (Signed)
 Subjective:   Patient ID: Ronald Duran, male   DOB: 83 y.o.   MRN: [id number]   HPI Chief Complaint  Patient presents with   Pre-ulcerative calluses    Pre-ulcerative calluses    83 year old male presents the office today with the above concerns.  He states his nails are also thickened elongated difficulty trimming them himself.  The calluses started to cause discomfort noting that the right foot under the big toe calluses been hurting more when that was hurting on the fifth toe.  Does not note any drainage or swelling or redness.   Review of Systems  All other systems reviewed and are negative.     Objective:  Physical Exam  General: AAO x3, NAD  Dermatological: Lesions noted right foot submetatarsal 1 and 5.  There is some dried blood present underneath submetatarsal 1.  Both lesions are preulcerative. Nails are hypertrophic, dystrophic, brittle, discolored, elongated 10. No surrounding redness or drainage. Tenderness nails 1-5 bilaterally.   Vascular: Dorsalis Pedis artery and Posterior Tibial artery pedal pulses are palpable bilateral with immedate capillary fill time. Chonic lower extremity edema   Neruologic: Grossly intact via light touch bilateral.   Musculoskeletal: Bunion deformity noted with second toe overlapping the hallux.  Prominent metatarsal head.     Assessment:   Preulcerative lesion right foot; symptomatic onychomycosis.  Plan:   Skin lesion right foot - Sharply debrided hyperkeratotic lesion x 2 on the right foot.  Submetatarsal 1.  Clean the area.  Antibiotic was applied followed by dressing.  Discussed daily dressing changes.  Monitor for signs or symptoms of infection.  Symptomatic onychomycosis - Sharply debrided nails x 10 without any complications or bleeding.  Return in about 4 weeks (around 04/15/2024).  Donnice JONELLE Fees DPM

## 2024-03-19 ENCOUNTER — Telehealth: Payer: Self-pay | Admitting: Cardiovascular Disease

## 2024-03-19 NOTE — Telephone Encounter (Signed)
  Patient is calling because he is trying to get his Eliquis  through the [facility] going forward and was told by the [facility] that they will need visit notes to support his need for the Eliquis  before they can fill it for him. Patient states he would like to pick the notes up so he can take them to the [facility]

## 2024-03-19 NOTE — Telephone Encounter (Signed)
 Spoke to patient. Informed patient he would like to pick up  notes - He will need  to contact  [facility] de[facility]  to do a  Medical release  for printed  material. Phone number  [phone].   RN informed  patient if he would like for the office to seed information - we  can route office note via mychart to [facility]. Will needd - fax # , provider's name, who to whom to  put their  #Attn , too. And any other thng to place on the f cover sheet. Patient and wife verbalized understanding.

## 2024-03-20 NOTE — Telephone Encounter (Signed)
 Documents faxed to [facility] at [phone] Attn: Jock Dry, RN   Will route to Dr Marjean RN to follow up if needed

## 2024-03-20 NOTE — Telephone Encounter (Signed)
 Pt is requesting a callback regarding him having the information that was requested for him to get yesterday when he spoke with nurse Reena. Please advise

## 2024-03-25 ENCOUNTER — Telehealth: Payer: Self-pay

## 2024-03-25 NOTE — Telephone Encounter (Signed)
 Spoke with patient in regards to myeloma lab panel prior to visit with Cassie, PA on 04/09/24.  Made patient lab appt on 04/02/24 @ 10:15.  Patient aware.

## 2024-04-02 ENCOUNTER — Inpatient Hospital Stay

## 2024-04-02 DIAGNOSIS — C9000 Multiple myeloma not having achieved remission: Secondary | ICD-10-CM

## 2024-04-02 DIAGNOSIS — Z5112 Encounter for antineoplastic immunotherapy: Secondary | ICD-10-CM | POA: Diagnosis not present

## 2024-04-02 LAB — CBC WITH DIFFERENTIAL (CANCER CENTER ONLY)
Abs Immature Granulocytes: 0.18 K/uL — ABNORMAL HIGH (ref 0.00–0.07)
Basophils Absolute: 0 K/uL (ref 0.0–0.1)
Basophils Relative: 0 %
Eosinophils Absolute: 0 K/uL (ref 0.0–0.5)
Eosinophils Relative: 0 %
HCT: 41.1 % (ref 39.0–52.0)
Hemoglobin: 14 g/dL (ref 13.0–17.0)
Immature Granulocytes: 1 %
Lymphocytes Relative: 2 %
Lymphs Abs: 0.4 K/uL — ABNORMAL LOW (ref 0.7–4.0)
MCH: 33.7 pg (ref 26.0–34.0)
MCHC: 34.1 g/dL (ref 30.0–36.0)
MCV: 98.8 fL (ref 80.0–100.0)
Monocytes Absolute: 0.7 K/uL (ref 0.1–1.0)
Monocytes Relative: 4 %
Neutro Abs: 18.2 K/uL — ABNORMAL HIGH (ref 1.7–7.7)
Neutrophils Relative %: 93 %
Platelet Count: 147 K/uL — ABNORMAL LOW (ref 150–400)
RBC: 4.16 MIL/uL — ABNORMAL LOW (ref 4.22–5.81)
RDW: 15.3 % (ref 11.5–15.5)
WBC Count: 19.5 K/uL — ABNORMAL HIGH (ref 4.0–10.5)
nRBC: 0 % (ref 0.0–0.2)

## 2024-04-02 LAB — CMP (CANCER CENTER ONLY)
ALT: 24 U/L (ref 0–44)
AST: 17 U/L (ref 15–41)
Albumin: 3.9 g/dL (ref 3.5–5.0)
Alkaline Phosphatase: 90 U/L (ref 38–126)
Anion gap: 9 (ref 5–15)
BUN: 28 mg/dL — ABNORMAL HIGH (ref 8–23)
CO2: 25 mmol/L (ref 22–32)
Calcium: 9.4 mg/dL (ref 8.9–10.3)
Chloride: 104 mmol/L (ref 98–111)
Creatinine: 1.28 mg/dL — ABNORMAL HIGH (ref 0.61–1.24)
GFR, Estimated: 57 mL/min — ABNORMAL LOW (ref >60)
Glucose, Bld: 150 mg/dL — ABNORMAL HIGH (ref 70–99)
Potassium: 3.9 mmol/L (ref 3.5–5.1)
Sodium: 138 mmol/L (ref 135–145)
Total Bilirubin: 1.3 mg/dL — ABNORMAL HIGH (ref 0.0–1.2)
Total Protein: 6 g/dL — ABNORMAL LOW (ref 6.5–8.1)

## 2024-04-03 LAB — BETA 2 MICROGLOBULIN, SERUM: Beta-2 Microglobulin: 2.1 mg/L (ref 0.6–2.4)

## 2024-04-03 LAB — KAPPA/LAMBDA LIGHT CHAINS
Kappa free light chain: 4 mg/L (ref 3.3–19.4)
Kappa, lambda light chain ratio: 1.82 — ABNORMAL HIGH (ref 0.26–1.65)
Lambda free light chains: 2.2 mg/L — ABNORMAL LOW (ref 5.7–26.3)

## 2024-04-03 LAB — IGG, IGA, IGM
IgA: <5 mg/dL — ABNORMAL LOW (ref 61–437)
IgG (Immunoglobin G), Serum: 227 mg/dL — ABNORMAL LOW (ref 603–1613)
IgM (Immunoglobulin M), Srm: 13 mg/dL — ABNORMAL LOW (ref 15–143)

## 2024-04-04 NOTE — Progress Notes (Signed)
 [facility] OFFICE PROGRESS NOTE  Kip Righter, MD [address] [place] [address]  DIAGNOSIS: 1) Multiple myeloma, presented with plasmacytoma as well as other lytic lesions in the bone diagnosed in March 2024 2) right lower lobe pulmonary nodule suspicious for non-small cell lung cancer  PRIOR THERAPY: SBRT to the right lower lobe pulmonary nodule under the care of Dr. Patrcia.   CURRENT THERAPY:  1) Systemic chemotherapy with daratumumab  subcutaneously weekly in addition to Velcade  1.3 Mg/M2 on days 1, 4, 8, 11 every 3 weeks as well as Revlimid  25 mg p.o. daily for 14 days every 3 weeks and Decadron  20 mg p.o. weekly with chemotherapy.  First cycle expected on 12/05/2022.  Status post 12 cycles. Revlimid  has been on hold since June/May 2024. Velcade  being held or cycle #7 due to PN and diarrhea.  2) Zometa  4 mg IV infusion.  Starting March 12, 2024 Zometa  will be giving every 12 weeks   INTERVAL HISTORY: Ronald Duran 83 y.o. male returns to the clinic today for a follow-up visit accompanied by his wife. The patient was last seen in the clinic by Dr. Sherrod on 03/12/24.   The patient is currently on treatment with Darzalex  and Decadron . Velcade  and Revlimid  were discontinued due to side effects, cytopenias, and peripheral neuropathy.    He has stable neuropathy. He is no longer on velcade .  He is currently undergoing treatment for a ringworm infection located on the back of his leg, extending from the buttocks down to the thigh. The treatment involves a 21-day course of medication.  He has recently transitioned from warfarin to Eliquis  for anticoagulation. He receives Eliquis  free from the [place] due to his disability status. No recent fevers, infections, night sweats, upper respiratory symptoms, gastrointestinal symptoms, abnormal bleeding, or changes in bruising patterns. Bruising is not as prominent as it has been in the past.  He maintains a stable  appetite and weight. He has a history of regular physical activity, including walking and playing tennis, which he believes has contributed to his overall health. He now uses a walker to maintain his cardiovascular health and to walk more efficiently.  He recently had a repeat myeloma panel performed. He is here today for evaluation and repeat blood work.      MEDICAL HISTORY: Past Medical History:  Diagnosis Date   Atrial fibrillation (HCC)    CAD (coronary artery disease)    a. s/p MI with LAD stent placement December, 1999 -last stress test November 2008 with no EKG evidence of ischemia  b. cath 07/23/2015 after abnormal ETT and nuc, occluded mid LAD stent with good distal collateral, medical therapy    Cardiomyopathy (HCC)    Ischemic cardiomyopathy with EF 40% by echo 2005   COVID 2021   mild case   Dyslipidemia    Erectile dysfunction    GERD (gastroesophageal reflux disease)    Hiatal hernia    with esophageal strictures and dilatations in the past - Dr. Princella Nida   History of nuclear stress test    Myoview 12/16: EF 47%, anterior, anteroseptal and inferoseptal defect suggesting infarct with peri-infarct ischemia, intermediate risk   HTN (hypertension)    Hx of seasonal allergies    Hyperlipidemia    Myocardial infarction (HCC) 08/01/1998   PAD (peripheral artery disease) (HCC)    Pre-diabetes    Prostate cancer (HCC)    with seed implant therapy following Lupron therapy in 2008 and 2009 - followed  by Dr. Alline Balding    Stroke [facility]) 04/20/2017   seen on a MRI   Vitamin D deficiency     ALLERGIES:  has no known allergies.  MEDICATIONS:  Current Outpatient Medications  Medication Sig Dispense Refill   acyclovir  (ZOVIRAX ) 200 MG capsule Take 1 capsule (200 mg total) by mouth 2 (two) times daily. 60 capsule 5   apixaban  (ELIQUIS ) 5 MG TABS tablet Take 1 tablet (5 mg total) by mouth 2 (two) times daily. 60 tablet 0   atorvastatin  (LIPITOR) 40 MG tablet Take 40  mg by mouth at bedtime.     B Complex Vitamins (B COMPLEX 100) tablet Take 1 tablet by mouth daily. Balance     cetirizine  (ZYRTEC  ALLERGY) 10 MG tablet Take 1 tablet (10 mg total) by mouth daily. 30 tablet 0   Cholecalciferol (VITAMIN D3) 2000 UNITS TABS Take 2,000 Units by mouth daily.     ciclopirox  (PENLAC ) 8 % solution Apply topically at bedtime. Apply over nail and surrounding skin. Apply daily over previous coat. After seven (7) days, may remove with alcohol and continue cycle. 6.6 mL 2   clindamycin (CLEOCIN T) 1 % lotion Apply 1 % topically 2 (two) times daily.     Coenzyme Q10 (CO Q-10) 100 MG CAPS Take 100 mg by mouth daily with supper.     cyanocobalamin (VITAMIN B12) 1000 MCG tablet Take 2,500 mcg by mouth daily.     dexamethasone  (DECADRON ) 4 MG tablet TAKE 5 tabS weekly, start with the first dose of chemotherapy. 40 tablet 3   fluticasone (CUTIVATE) 0.05 % cream Apply 1 Application topically 2 (two) times daily. As needed     furosemide  (LASIX ) 40 MG tablet Take 1 tablet (40 mg total) by mouth 2 (two) times daily. 60 tablet 11   ketoconazole (NIZORAL) 2 % cream Apply 1 Application topically daily as needed for irritation.     metoprolol  tartrate (LOPRESSOR ) 25 MG tablet Take 1 tablet (25 mg total) by mouth 2 (two) times daily. 90 tablet 3   Multiple Vitamins-Minerals (CENTRUM) tablet Take 1 tablet by mouth daily.     nitroGLYCERIN  (NITROSTAT ) 0.4 MG SL tablet PLACE 1 TABLET UNDER THE TONGUE IF NEEDED EVERY 5 MINUTES FOR CHEST PAIN FOR 3 DOSES IF NO RELIEF AFTER FIRST DOSE CALL 911. 25 tablet 9   potassium chloride  SA (KLOR-CON  M) 20 MEQ tablet Take 1 tablet (20 mEq total) by mouth daily. 30 tablet 11   warfarin (COUMADIN ) 5 MG tablet TAKE 1/2 TO 1 TABLET BY MOUTH AS DIRECTED by coumadin  clinic 30 tablet 5   No current facility-administered medications for this visit.    SURGICAL HISTORY:  Past Surgical History:  Procedure Laterality Date   BRONCHIAL BIOPSY  11/15/2022    Procedure: BRONCHIAL BIOPSIES;  Surgeon: Brenna Adine CROME, DO;  Location: [facility];  Service: Pulmonary;;   BRONCHIAL BRUSHINGS  11/15/2022   Procedure: BRONCHIAL BRUSHINGS;  Surgeon: Brenna Adine CROME, DO;  Location: [facility];  Service: Pulmonary;;   BRONCHIAL NEEDLE ASPIRATION BIOPSY  11/15/2022   Procedure: BRONCHIAL NEEDLE ASPIRATION BIOPSIES;  Surgeon: Brenna Adine CROME, DO;  Location: [facility];  Service: Pulmonary;;   CARDIAC CATHETERIZATION N/A 07/23/2015   Procedure: Left Heart Cath and Coronary Angiography;  Surgeon: Victory LELON Sharps, MD;  Location: [facility] INVASIVE CV LAB;  Service: Cardiovascular;  Laterality: N/A;   CARDIOVERSION N/A 06/03/2022   Procedure: CARDIOVERSION;  Surgeon: Okey Vina GAILS, MD;  Location: [facility] ENDOSCOPY;  Service: Cardiovascular;  Laterality: N/A;   COLONOSCOPY WITH PROPOFOL  N/A 11/24/2015   Procedure: COLONOSCOPY WITH PROPOFOL ;  Surgeon: Gladis MARLA Louder, MD;  Location: [facility] ENDOSCOPY;  Service: Endoscopy;  Laterality: N/A;   esophageal strictures with dilatations in the past per Dr. Princella Nida     last '98   FIDUCIAL MARKER PLACEMENT  11/15/2022   Procedure: FIDUCIAL MARKER PLACEMENT;  Surgeon: Brenna Adine CROME, DO;  Location: [facility];  Service: Pulmonary;;   HERNIA REPAIR Left    INTRAMEDULLARY (IM) NAIL INTERTROCHANTERIC Left 10/25/2022   Procedure: LEFT INTRAMEDULLARY (IM) NAIL INTERTROCHANTERIC;  Surgeon: Jerri Kay HERO, MD;  Location: [facility] OR;  Service: Orthopedics;  Laterality: Left;   IR BONE MARROW BIOPSY & ASPIRATION  11/17/2022   IR BONE TUMOR(S)RF ABLATION  10/11/2022   IR KYPHO THORACIC WITH BONE BIOPSY  10/11/2022   LAD stent placement - Dr. Victory Sharps     left inguinal hernia repair - Dr. Ozell Gander     prostatic radioactive seed implantation - Dr. Jason     '09    REVIEW OF SYSTEMS:   Review of Systems  Constitutional: Positive for stable fatigue. Negative for appetite change, chills,  fever and unexpected weight change.  HENT: Negative for  mouth sores, nosebleeds, sore throat and trouble swallowing.   Eyes: Negative for eye problems and icterus.  Respiratory: Negative for cough, hemoptysis, shortness of breath and wheezing.   Cardiovascular: Negative for chest pain. Positive for stable mild bilateral lower extremity pitting edema (L>R).  Gastrointestinal: Negative for abdominal pain, constipation, diarrhea, nausea and vomiting.  Genitourinary: Negative for bladder incontinence, difficulty urinating, dysuria, frequency and hematuria.   Musculoskeletal: Positive for occasional chronic back. Negative for gait problem, neck pain and neck stiffness.  Skin:  Positive for dry skin and rash on legs.  Neurological: Negative for dizziness, extremity weakness, gait problem, headaches, light-headedness and seizures.  Hematological: Negative for adenopathy. Does not bleed easily. Positive for baseline bruising on upper extremities due to blood thinner use.  Psychiatric/Behavioral: Negative for confusion, depression and sleep disturbance. The patient is not nervous/anxious.      PHYSICAL EXAMINATION:  There were no vitals taken for this visit.  ECOG PERFORMANCE STATUS: 1  Physical Exam  Constitutional: Oriented to person, place, and time and thin appearing male, and in no distress.  HENT:  Head: Normocephalic and atraumatic.  Mouth/Throat: Oropharynx is clear and moist. No oropharyngeal exudate.  Eyes: Conjunctivae are normal. Right eye exhibits no discharge. Left eye exhibits no discharge. No scleral icterus.  Neck: Normal range of motion. Neck supple.  Cardiovascular: Normal rate, regular rhythm, normal heart sounds and intact distal pulses.   Pulmonary/Chest: Effort normal and breath sounds normal. No respiratory distress. No wheezes. No rales.  Abdominal: Soft. Bowel sounds are normal. Exhibits no distension and no mass. There is no tenderness.  Musculoskeletal: Normal range of motion. Mild left leg pitting edema.    Lymphadenopathy:    No cervical adenopathy.  Neurological: Positive for tremor. Alert and oriented to person, place, and time. Exhibits normal muscle tone. Gait normal. Coordination normal. Uses a walker for ambulation Skin: Positive for bruising. Not diaphoretic. No pallor.  Psychiatric: Mood, memory and judgment normal.  Vitals reviewed.  LABORATORY DATA: Lab Results  Component Value Date   WBC 19.5 (H) 04/02/2024   HGB 14.0 04/02/2024   HCT 41.1 04/02/2024   MCV 98.8 04/02/2024   PLT 147 (L) 04/02/2024      Chemistry      Component Value Date/Time  NA 138 04/02/2024 1015   NA 143 12/27/2023 0000   K 3.9 04/02/2024 1015   CL 104 04/02/2024 1015   CO2 25 04/02/2024 1015   BUN 28 (H) 04/02/2024 1015   BUN 20 12/27/2023 0000   CREATININE 1.28 (H) 04/02/2024 1015   CREATININE 1.23 (H) 07/21/2015 0917      Component Value Date/Time   CALCIUM  9.4 04/02/2024 1015   ALKPHOS 90 04/02/2024 1015   AST 17 04/02/2024 1015   ALT 24 04/02/2024 1015   BILITOT 1.3 (H) 04/02/2024 1015       RADIOGRAPHIC STUDIES:  No results found.   ASSESSMENT/PLAN:  This is a very pleasant 83 year old Caucasian male with multiple myeloma presented with multiple lytic lesions involving T10, left femoral neck as well as T4, T9 and T11 vertebral bodies.    He also had right lower lobe lung nodule that was suspicious for non-small cell lung cancer.    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    he biopsy from the left hip was negative for malignancy. His myeloma panel showed no concerning findings except for elevated IgG but the serum light chains were normal. The patient underwent left hip fixation for impending fracture. He also underwent palliative radiotherapy under the care of Dr. Patrcia. His recent bone marrow biopsy and aspirate was consistent with plasma cell neoplasm  with involvement of plasma cells 25% of the bone marrow.   The patient is currently on treatment with daratumumab , Velcade , Revlimid  and dexamethasone  started on 12/05/2022 status post 17 cycle. The patient has had treatment held periodically due to thrombocytopenia.  His dose of Revlimid  was reduced to 15 milligrams due to thrombocytopenia.  However, his Revlimid  was ultimately held starting from May/June 2024.    Velcade  was discontinued as the patient was recovering from diarrhea, hypotension, and dizziness.  He also was developing peripheral neuropathy.   Labs were reviewed.  Recommend he continue with dara every 4 weeks as scheduled today. He will proceed with treatment today as scheduled.    Will continue to receive Zometa  every 3 months. His next zometa  is due on November 2025.   The patient recently had a repeat myeloma panel. The patient was seen with Dr. Sherrod. Dr. Sherrod reviewed the results which showed stable disease.   We will arrange for IVIG infusion next with with 1 g due to low protein.    We will see him back for a follow-up visit in 4 weeks before his next cycle of treatment.    Lung cancer Previously treated with SBRT. No symptoms of recurrence. Surveillance CT scans essential. - Order surveillance CT scan in three weeks before next appointment.   Easy bruising Easy bruising likely related to Eliquis . Bruising stable/slightly improved  The patient is not interested in starting medication for neuropathy at this time but knows it is available if needed.   Ringworm infection of lower extremity Ringworm infection on the back of the leg, extending from the buttocks to the thigh, is being treated. - Continue current antifungal treatment for 21 days  The patient was advised to call immediately if he has any concerning symptoms in the interval. The patient voices understanding of current disease status and treatment options and is in agreement with the current care  plan. All questions were answered. The patient knows to call the clinic with any problems, questions or concerns. We  can certainly see the patient much sooner if necessary  No orders of the defined types were placed in this encounter.    Ronald Logue L Lonald Troiani, PA-C 04/04/24  ADDENDUM: Hematology/Oncology Attending: I had a face-to-face encounter with the patient today.  I reviewed his records, lab and recommended his care plan.  This is a very pleasant 83 years old white male with multiple myeloma diagnosed in March 2024.  He has been on treatment with subcutaneous daratumumab  in addition to Velcade , Revlimid  and Decadron  but his treatment with Revlimid  and Velcade  has been discontinued secondary to peripheral neuropathy and diarrhea.  He is status post total of 12 cycles.  He is tolerating this treatment well and feeling well with no complaints.  He had repeat myeloma panel performed recently.  I discussed the lab result with the patient.  His IgG level is down to 227 and this could both The patient at risk for infection.  Will arrange for him to receive IVIG to keep his IgG level above 500. He will proceed with cycle #13 today as planned. Will see the patient back for follow-up visit in 4 weeks for evaluation before the next cycle of his treatment. He was advised to call immediately if he has any other concerning symptoms in the interval. The total time spent in the appointment was 30 minutes including review of chart and various tests results, discussions about plan of care and coordination of care plan . Disclaimer: This note was dictated with voice recognition software. Similar sounding words can inadvertently be transcribed and may be missed upon review. Sherrod MARLA Sherrod, MD

## 2024-04-09 ENCOUNTER — Inpatient Hospital Stay

## 2024-04-09 ENCOUNTER — Encounter: Payer: Self-pay | Admitting: Internal Medicine

## 2024-04-09 ENCOUNTER — Inpatient Hospital Stay: Attending: Internal Medicine | Admitting: Physician Assistant

## 2024-04-09 ENCOUNTER — Other Ambulatory Visit: Payer: Self-pay | Admitting: Physician Assistant

## 2024-04-09 VITALS — BP 96/66 | HR 68 | Temp 97.1°F | Resp 17 | Ht 72.0 in | Wt 176.1 lb

## 2024-04-09 DIAGNOSIS — B359 Dermatophytosis, unspecified: Secondary | ICD-10-CM | POA: Diagnosis not present

## 2024-04-09 DIAGNOSIS — Z79620 Long term (current) use of immunosuppressive biologic: Secondary | ICD-10-CM | POA: Insufficient documentation

## 2024-04-09 DIAGNOSIS — Z5112 Encounter for antineoplastic immunotherapy: Secondary | ICD-10-CM | POA: Diagnosis present

## 2024-04-09 DIAGNOSIS — C9000 Multiple myeloma not having achieved remission: Secondary | ICD-10-CM

## 2024-04-09 LAB — CMP (CANCER CENTER ONLY)
ALT: 27 U/L (ref 0–44)
AST: 19 U/L (ref 15–41)
Albumin: 3.5 g/dL (ref 3.5–5.0)
Alkaline Phosphatase: 87 U/L (ref 38–126)
Anion gap: 4 — ABNORMAL LOW (ref 5–15)
BUN: 16 mg/dL (ref 8–23)
CO2: 29 mmol/L (ref 22–32)
Calcium: 9.5 mg/dL (ref 8.9–10.3)
Chloride: 104 mmol/L (ref 98–111)
Creatinine: 1.18 mg/dL (ref 0.61–1.24)
GFR, Estimated: >60 mL/min (ref >60)
Glucose, Bld: 121 mg/dL — ABNORMAL HIGH (ref 70–99)
Potassium: 4 mmol/L (ref 3.5–5.1)
Sodium: 137 mmol/L (ref 135–145)
Total Bilirubin: 1.2 mg/dL (ref 0.0–1.2)
Total Protein: 5.7 g/dL — ABNORMAL LOW (ref 6.5–8.1)

## 2024-04-09 LAB — CBC WITH DIFFERENTIAL (CANCER CENTER ONLY)
Abs Immature Granulocytes: 0.09 K/uL — ABNORMAL HIGH (ref 0.00–0.07)
Basophils Absolute: 0 K/uL (ref 0.0–0.1)
Basophils Relative: 0 %
Eosinophils Absolute: 0.1 K/uL (ref 0.0–0.5)
Eosinophils Relative: 1 %
HCT: 40.5 % (ref 39.0–52.0)
Hemoglobin: 13.6 g/dL (ref 13.0–17.0)
Immature Granulocytes: 1 %
Lymphocytes Relative: 2 %
Lymphs Abs: 0.2 K/uL — ABNORMAL LOW (ref 0.7–4.0)
MCH: 33.5 pg (ref 26.0–34.0)
MCHC: 33.6 g/dL (ref 30.0–36.0)
MCV: 99.8 fL (ref 80.0–100.0)
Monocytes Absolute: 0.5 K/uL (ref 0.1–1.0)
Monocytes Relative: 4 %
Neutro Abs: 10.5 K/uL — ABNORMAL HIGH (ref 1.7–7.7)
Neutrophils Relative %: 92 %
Platelet Count: 146 K/uL — ABNORMAL LOW (ref 150–400)
RBC: 4.06 MIL/uL — ABNORMAL LOW (ref 4.22–5.81)
RDW: 15.4 % (ref 11.5–15.5)
WBC Count: 11.6 K/uL — ABNORMAL HIGH (ref 4.0–10.5)
nRBC: 0 % (ref 0.0–0.2)

## 2024-04-09 MED ORDER — DARATUMUMAB-HYALURONIDASE-FIHJ 1800-30000 MG-UT/15ML ~~LOC~~ SOLN
1800.0000 mg | Freq: Once | SUBCUTANEOUS | Status: AC
  Administered 2024-04-09: 1800 mg via SUBCUTANEOUS
  Filled 2024-04-09: qty 15

## 2024-04-09 MED ORDER — ACETAMINOPHEN 325 MG PO TABS
650.0000 mg | ORAL_TABLET | Freq: Once | ORAL | Status: AC
  Administered 2024-04-09: 650 mg via ORAL
  Filled 2024-04-09: qty 2

## 2024-04-09 MED ORDER — DIPHENHYDRAMINE HCL 25 MG PO CAPS
50.0000 mg | ORAL_CAPSULE | Freq: Once | ORAL | Status: AC
  Administered 2024-04-09: 50 mg via ORAL
  Filled 2024-04-09: qty 2

## 2024-04-09 NOTE — Progress Notes (Signed)
 Patient states he took dexamethasone  prior to his appointment today.

## 2024-04-10 ENCOUNTER — Other Ambulatory Visit: Payer: Self-pay | Admitting: Physician Assistant

## 2024-04-10 DIAGNOSIS — C9000 Multiple myeloma not having achieved remission: Secondary | ICD-10-CM

## 2024-04-11 ENCOUNTER — Telehealth: Payer: Self-pay | Admitting: Medical Oncology

## 2024-04-11 ENCOUNTER — Other Ambulatory Visit: Payer: Self-pay | Admitting: Internal Medicine

## 2024-04-11 NOTE — Telephone Encounter (Signed)
Returned pt's call from yesterday

## 2024-04-15 ENCOUNTER — Telehealth: Payer: Self-pay

## 2024-04-15 NOTE — Telephone Encounter (Signed)
 Ronald Duran, patient will be scheduled as soon as possible.  Auth Submission: APPROVED Site of care: Site of care: [facility] Payer: [employer] medicare Medication & CPT/J Code(s) submitted: Privigen (IVIG) J1459 Diagnosis Code:  Route of submission (phone, fax, portal): portal Phone # Fax # Auth type: Buy/Bill PB Units/visits requested: 1000mg /kg x 1 dose Reference number: [id number] Approval from: 04/11/24 to 04/11/25

## 2024-04-16 ENCOUNTER — Other Ambulatory Visit: Payer: Self-pay | Admitting: Internal Medicine

## 2024-04-21 ENCOUNTER — Other Ambulatory Visit: Payer: Self-pay

## 2024-04-22 ENCOUNTER — Ambulatory Visit (INDEPENDENT_AMBULATORY_CARE_PROVIDER_SITE_OTHER): Admitting: Podiatry

## 2024-04-22 DIAGNOSIS — L84 Corns and callosities: Secondary | ICD-10-CM | POA: Diagnosis not present

## 2024-04-22 DIAGNOSIS — M2041 Other hammer toe(s) (acquired), right foot: Secondary | ICD-10-CM

## 2024-04-22 DIAGNOSIS — M21619 Bunion of unspecified foot: Secondary | ICD-10-CM

## 2024-04-22 DIAGNOSIS — L97511 Non-pressure chronic ulcer of other part of right foot limited to breakdown of skin: Secondary | ICD-10-CM | POA: Diagnosis not present

## 2024-04-22 NOTE — Patient Instructions (Signed)
 Monitor for any signs/symptoms of infection. Call the office immediately if any occur or go directly to the emergency room. Call with any questions/concerns.

## 2024-04-22 NOTE — Progress Notes (Signed)
 Subjective:   Patient ID: Ronald Duran, male   DOB: 83 y.o.   MRN: [id number]   HPI Chief Complaint  Patient presents with   Pre-ulcerative calluses    Pt is here for a follow up for Pre-ulcerative calluses      83 year old male presents the office today with the above concerns.  He states that the callus is doing better.  He states that his big toe and second toe rub given hammertoe and he gets some calcium  buildup to this area.  He denies any drainage or pus.  He keeps a pad on the area the callus submetatarsal but no recent treatment for the area and second toe.  He does not report any fevers or chills.    Review of Systems  All other systems reviewed and are negative.     Objective:  Physical Exam  General: AAO x3, NAD  Dermatological: Lesions noted right foot submetatarsal 1 and 5.  There is some dried blood present underneath submetatarsal 1, although minimal today.  There is a superficial area of skin breakdown noted on the right second toe medial aspect to limit DIPJ.  This is from irritation of the rubbing between the big toe and second toe.  There is hyperkeratotic tissue on the periphery.  The wound is superficial measuring 0.4 x 0.3 cm with a granular base without any probing, undermining or tunneling.  There is no fluctuation or crepitation.  There is no malodor.   Vascular: Dorsalis Pedis artery and Posterior Tibial artery pedal pulses are palpable bilateral with immedate capillary fill time. Chonic lower extremity edema   Neruologic: Grossly intact via light touch bilateral.   Musculoskeletal: Bunion deformity noted with second toe overlapping the hallux.  Prominent metatarsal head.     Assessment:   Preulcerative lesion right foot; bunion, hammertoe deformity  Plan:   Skin lesion right foot - Sharply debrided hyperkeratotic lesion x 2 on the right foot as a courtesy as they were minimal.  Continue moisturizer, offloading.  Bunion, hammertoe resultant  ulceration - The rubbing of the toe separated the wound.  Medically necessary wound debridement is performed today.  Utilized [phone] by scalpel to sharply debride the hyperkeratotic tissue to reveal a superficial granular wound at the distal portion of the medial right second toe.  I debrided unhealthy, granular tissue.  No blood loss.  Tolerated well.  Antibiotic ointment was applied followed by dressing.  Swelling - He has not been on compression socks due to rubbing the toes.  Discussed open toed compression socks. Return in about 4 weeks (around 04/15/2024).  Donnice JONELLE Fees DPM

## 2024-04-25 NOTE — Progress Notes (Signed)
 CARDIOLOGY CONSULT NOTE       Patient ID: GILLIS BOARDLEY MRN: [id number] DOB/AGE: 1940/11/08 83 y.o.   Referring Physician: Croitoru Primary Physician: Kip Righter, MD Primary Cardiologist: Delford Reason for Consultation: CHF   HPI:  83 y.o. previously followed by Dr Claudene. I saw him in hospital for consult 01/26/23.  Hx of CAD, HLD, NSCLC s/p radiation therapy, chronic systolic heart failure with hypotension, PAF on OAC with coumadin  . MI with stent placement in the LAD 07/1998 with ICM and EF 40% on echo 2005. Heart cath 07/2015 after abnormal nuclear stress test showed total occlusion in the LAD at the site of the previously placed stent with well-developed collaterals form dominant right. EF 45-50% with apical akinesis. TTE done 02/08/24 EF 30-35% moderate MR    Afib incidentally found in Sept 2023 by PCP. DCCV on 06/03/22 with early return to Afib. Given history of cardiomyopathy, he was referred to EP for consideration of ablation. CT chest detected multiple pulmonary nodules suspicious for possible malignancy and ablation was canceled. Biopsy of lytic bone lesion revealed NSCLC. He has received palliative radiotherapy. Chemotherapy stopped due to thrombocytopenia. 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 Heme/onc felt it was safe to continue OAC. He was on coumadin  Not considered a failure with fluctuating INR and recent lovenox  bridge for hip procedure  Seen by Dr Santo May 2024 with rate controlled afib  Seen by Dr Francyne 12/06/23 with volume overload.   TTE 01/25/23 EF 35-40% ? Small mural apical thrombus INR Rx over last 12 months.  BNP 12/28/23 in 490.8 with normal Cr/K  Rx with lasix  40 mg daily and lopressor  12.5 mg bid BP has been borderline and not on ARB/ARNI.   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 Now on eliquis  through [place]  Discussed taking extra lasix  on Mondays when he takes decadron . Discussed increasing lopressor  for better rate control  No edema feels well Discussed cutting back lasix  to 20 mg PRN. He wants to see Dr Nancey again to discuss ablation that was canceled when he was diagnosed with cancer  [place] went [place] [employer] this [place]   ROS All other systems reviewed and negative except as noted above  Past Medical History:  Diagnosis Date   Atrial fibrillation (HCC)    CAD (coronary artery disease)    a. s/p MI with LAD stent placement December, 1999 -last stress test November 2008 with no EKG evidence of ischemia  b. cath 07/23/2015 after abnormal ETT and nuc, occluded mid LAD stent with good distal collateral, medical therapy    Cardiomyopathy (HCC)    Ischemic cardiomyopathy with EF 40% by echo 2005   COVID 2021   mild case   Dyslipidemia    Erectile dysfunction    GERD (gastroesophageal reflux disease)    Hiatal hernia    with esophageal strictures and dilatations in the past - Dr. Princella Nida   History of nuclear stress test    Myoview 12/16: EF 47%, anterior, anteroseptal and inferoseptal defect  suggesting infarct with peri-infarct ischemia, intermediate risk   HTN (hypertension)    Hx of seasonal allergies    Hyperlipidemia    Myocardial infarction (HCC) 08/01/1998   PAD (peripheral artery disease)    Pre-diabetes    Prostate cancer (HCC)    with seed implant therapy following Lupron therapy in 2008 and 2009 - followed by Dr. Alline   Ringworm    Stroke [facility]) 04/20/2017   seen on a MRI   Vitamin D deficiency     Family History  Problem Relation Age of Onset    CVA Mother    Diabetes Father    Heart disease Father    CVA Father 22   CAD Sister    Diabetes Sister     Social History   Socioeconomic History   Marital status: Married    Spouse name: ,Orlean   Number of children: 4   Years of education: Not on file   Highest education level: Not on file  Occupational History   Not on file  Tobacco Use   Smoking status: Never    Passive exposure: Past   Smokeless tobacco: Never  Vaping Use   Vaping status: Never Used  Substance and Sexual Activity   Alcohol use: Not Currently   Drug use: No   Sexual activity: Not Currently  Other Topics Concern   Not on file  Social History Narrative   Not on file   Social Drivers of Health   Financial Resource Strain: Not on file  Food Insecurity: No Food Insecurity (04/02/2023)   Hunger Vital Sign    Worried About Running Out of Food in the Last Year: Never true    Ran Out of Food in the Last Year: Never true  Transportation Needs: No Transportation Needs (04/02/2023)   PRAPARE - Administrator, Civil Service (Medical): No    Lack of Transportation (Non-Medical): No  Recent Concern: Transportation Needs - Unmet Transportation Needs (01/24/2023)   PRAPARE - Administrator, Civil Service (Medical): Yes    Lack of Transportation (Non-Medical): No  Physical Activity: Not on file  Stress: Not on file  Social Connections: Not on file  Intimate Partner Violence: Not At Risk (04/02/2023)   Humiliation, Afraid, Rape, and Kick questionnaire    Fear of Current or Ex-Partner: No    Emotionally Abused: No    Physically Abused: No    Sexually Abused: No    Past Surgical History:  Procedure Laterality Date   BRONCHIAL BIOPSY  11/15/2022   Procedure: BRONCHIAL BIOPSIES;  Surgeon: Brenna Adine CROME, DO;  Location: [facility] ENDOSCOPY;  Service: Pulmonary;;   BRONCHIAL BRUSHINGS  11/15/2022   Procedure: BRONCHIAL BRUSHINGS;  Surgeon: Brenna Adine CROME, DO;  Location: [facility] ENDOSCOPY;  Service:  Pulmonary;;   BRONCHIAL NEEDLE ASPIRATION BIOPSY  11/15/2022   Procedure: BRONCHIAL NEEDLE ASPIRATION BIOPSIES;  Surgeon: Brenna Adine CROME, DO;  Location: [facility] ENDOSCOPY;  Service: Pulmonary;;   CARDIAC CATHETERIZATION N/A 07/23/2015   Procedure: Left Heart Cath and Coronary Angiography;  Surgeon: Victory LELON Sharps, MD;  Location: [facility] INVASIVE CV LAB;  Service: Cardiovascular;  Laterality: N/A;   CARDIOVERSION N/A 06/03/2022   Procedure: CARDIOVERSION;  Surgeon: Okey Vina GAILS, MD;  Location: [facility] ENDOSCOPY;  Service: Cardiovascular;  Laterality: N/A;   COLONOSCOPY WITH PROPOFOL  N/A 11/24/2015   Procedure: COLONOSCOPY WITH PROPOFOL ;  Surgeon: Gladis MARLA Louder, MD;  Location: [facility] ENDOSCOPY;  Service: Endoscopy;  Laterality: N/A;   esophageal strictures with dilatations in  the past per Dr. Princella Nida     last '98   FIDUCIAL MARKER PLACEMENT  11/15/2022   Procedure: FIDUCIAL MARKER PLACEMENT;  Surgeon: Brenna Adine CROME, DO;  Location: [facility] ENDOSCOPY;  Service: Pulmonary;;   HERNIA REPAIR Left    INTRAMEDULLARY (IM) NAIL INTERTROCHANTERIC Left 10/25/2022   Procedure: LEFT INTRAMEDULLARY (IM) NAIL INTERTROCHANTERIC;  Surgeon: Jerri Kay HERO, MD;  Location: [facility] OR;  Service: Orthopedics;  Laterality: Left;   IR BONE MARROW BIOPSY & ASPIRATION  11/17/2022   IR BONE TUMOR(S)RF ABLATION  10/11/2022   IR KYPHO THORACIC WITH BONE BIOPSY  10/11/2022   LAD stent placement - Dr. Victory Sharps     left inguinal hernia repair - Dr. Ozell Gander     prostatic radioactive seed implantation - Dr. Jason     '09      Current Outpatient Medications:    acyclovir  (ZOVIRAX ) 200 MG capsule, Take 1 capsule (200 mg total) by mouth 2 (two) times daily., Disp: 60 capsule, Rfl: 5   apixaban  (ELIQUIS ) 5 MG TABS tablet, Take 1 tablet (5 mg total) by mouth 2 (two) times daily., Disp: 60 tablet, Rfl: 0   atorvastatin  (LIPITOR) 40 MG tablet, 1 tablet Orally Once a day; Duration: 90 days, Disp: , Rfl:    B Complex Vitamins (B COMPLEX 100) tablet,  Take 1 tablet by mouth daily. Balance, Disp: , Rfl:    cetirizine  (ZYRTEC  ALLERGY) 10 MG tablet, Take 1 tablet (10 mg total) by mouth daily., Disp: 30 tablet, Rfl: 0   Cholecalciferol (VITAMIN D3) 2000 UNITS TABS, Take 2,000 Units by mouth daily., Disp: , Rfl:    clindamycin (CLEOCIN T) 1 % lotion, Apply 1 % topically 2 (two) times daily., Disp: , Rfl:    Coenzyme Q10 (CO Q-10) 100 MG CAPS, Take 100 mg by mouth daily with supper., Disp: , Rfl:    cyanocobalamin (VITAMIN B12) 1000 MCG tablet, Take 2 tablets by mouth daily., Disp: , Rfl:    dexamethasone  (DECADRON ) 4 MG tablet, TAKE 5 tabS weekly, start with the first dose of chemotherapy., Disp: 40 tablet, Rfl: 3   furosemide  (LASIX ) 40 MG tablet, Take 1 tablet (40 mg total) by mouth 2 (two) times daily., Disp: 60 tablet, Rfl: 11   ketoconazole (NIZORAL) 2 % cream, Apply 1 Application topically daily as needed for irritation., Disp: , Rfl:    metoprolol  tartrate (LOPRESSOR ) 25 MG tablet, Take 1 tablet (25 mg total) by mouth 2 (two) times daily., Disp: 90 tablet, Rfl: 3   Multiple Vitamins-Minerals (CENTRUM) tablet, Take 1 tablet by mouth daily., Disp: , Rfl:    nitroGLYCERIN  (NITROSTAT ) 0.4 MG SL tablet, PLACE 1 TABLET UNDER THE TONGUE IF NEEDED EVERY 5 MINUTES FOR CHEST PAIN FOR 3 DOSES IF NO RELIEF AFTER FIRST DOSE CALL 911., Disp: 25 tablet, Rfl: 9   potassium chloride  SA (KLOR-CON  M) 20 MEQ tablet, Take 1 tablet (20 mEq total) by mouth daily., Disp: 30 tablet, Rfl: 11   atorvastatin  (LIPITOR) 40 MG tablet, Take 40 mg by mouth at bedtime. (Patient not taking: Reported on 05/08/2024), Disp: , Rfl:    ciclopirox  (PENLAC ) 8 % solution, Apply topically at bedtime. Apply over nail and surrounding skin. Apply daily over previous coat. After seven (7) days, may remove with alcohol and continue cycle. (Patient not taking: Reported on 05/08/2024), Disp: 6.6 mL, Rfl: 2   cyanocobalamin (VITAMIN B12) 1000 MCG tablet, Take 2,500 mcg by mouth daily. (Patient not  taking: Reported on 05/08/2024), Disp: ,  Rfl:    fluticasone (CUTIVATE) 0.05 % cream, Apply 1 Application topically 2 (two) times daily. As needed, Disp: , Rfl:    terbinafine (LAMISIL) 250 MG tablet, Take 250 mg by mouth daily. (Patient not taking: Reported on 05/08/2024), Disp: , Rfl:   TTE 02/08/24    1. Left ventricular ejection fraction, by estimation, is 30 to 35%. The  left ventricle has moderately decreased function. The left ventricle  demonstrates global hypokinesis. The left ventricular internal cavity size  was moderately dilated. Left  ventricular diastolic parameters are indeterminate. There is akinesis of  the left ventricular, apical anterior wall, inferior wall, apical segment,  inferoseptal wall, anterolateral wall and anteroseptal wall.   2. Right ventricular systolic function was not well visualized. The right  ventricular size is normal.   3. Left atrial size was moderately dilated.   4. Right atrial size was mildly dilated.   5. The mitral valve is normal in structure. Moderate mitral valve  regurgitation. No evidence of mitral stenosis.   6. The aortic valve has an indeterminant number of cusps. There is mild  calcification of the aortic valve. Aortic valve regurgitation is mild.  Aortic valve sclerosis is present, with no evidence of aortic valve  stenosis.   Comparison(s): Prior images reviewed side by side. Changes from prior  study are noted. EF now 30-35%, apical akinesis noted previously (dates to  2016), moderate MR, mild AR now present.    Physical Exam: Blood pressure (!) 74/50, pulse 72, height 6' (1.829 m), weight 172 lb (78 kg).    Elderly male Lungs clear PMI increased apical MR murmur   Abdomen benign LE edema bilateral plus one   Labs:   Lab Results  Component Value Date   WBC 11.4 (H) 05/06/2024   HGB 13.6 05/06/2024   HCT 41.0 05/06/2024   MCV 98.8 05/06/2024   PLT 175 05/06/2024     Radiology: No results found.  EKG: afib rate  95 poor R wave progression 12/06/23    ASSESSMENT AND PLAN:   CHF:  in setting of active chemo for myeloma Ischemic DCM with LAD infarct and apical mural thrombus. EF 30-35% by TTE 02/08/24 Lasix  20 mg PRN.  GDMT limited by low BP/age  K 4.0 Cr 1.18 labs 04/09/24  Afib: chronic rate control with lopressor   25 mg bid. Now on eliquis  for anticoagulation  Myeloma:  active Rx with 25% plasma cells in bone marrow and lytic bone lesions F/U Dr Agustin  The patient is currently on treatment with Darzalex  and Decadron . Velcade  and Revlimid  were discontinued due to side effects, cytopenias, and peripheral neuropathy.  NSCLC:  post XRT oncology CT 01/10/24 with RLL XRT changes no evidence of recurrent metastatic dx.  HLD:  on statin labs with primary  F/U EP Mealor to discuss ablation again  F/U in 6 months    Signed: Maude Emmer 05/08/2024, 8:39 AM

## 2024-04-29 ENCOUNTER — Telehealth: Payer: Self-pay | Admitting: Medical Oncology

## 2024-04-29 NOTE — Telephone Encounter (Signed)
 Pt asking if he needs to alter any home medications  before his IVIG infusion.I returned pt call and told him there is no need to alter any home medications and to take decadron  as prescribed.   Orlean was notified.

## 2024-05-01 ENCOUNTER — Ambulatory Visit

## 2024-05-01 VITALS — BP 97/63 | HR 85 | Temp 97.8°F | Resp 16 | Ht 72.0 in | Wt 172.8 lb

## 2024-05-01 DIAGNOSIS — C9000 Multiple myeloma not having achieved remission: Secondary | ICD-10-CM

## 2024-05-01 MED ORDER — DEXTROSE 5 % IV SOLN: INTRAVENOUS | Status: DC

## 2024-05-01 MED ORDER — ACETAMINOPHEN 325 MG PO TABS
650.0000 mg | ORAL_TABLET | Freq: Once | ORAL | Status: AC
  Administered 2024-05-01: 650 mg via ORAL
  Filled 2024-05-01: qty 2

## 2024-05-01 MED ORDER — DIPHENHYDRAMINE HCL 25 MG PO CAPS
25.0000 mg | ORAL_CAPSULE | Freq: Once | ORAL | Status: AC
  Administered 2024-05-01: 25 mg via ORAL
  Filled 2024-05-01: qty 1

## 2024-05-01 MED ORDER — IMMUNE GLOBULIN (HUMAN) 5 GM/50ML IV SOLN
1.0000 g/kg | Freq: Once | INTRAVENOUS | Status: AC
  Administered 2024-05-01: 80 g via INTRAVENOUS

## 2024-05-01 NOTE — Progress Notes (Signed)
 Diagnosis:   Multiple myeloma not having achieved remission   Provider:  Lonna Coder MD  Procedure: IV Infusion  IV Type: Peripheral, IV Location: L Forearm  IVIG (Immune Globulin ), Dose: 80 g  Infusion Start Time: 1001  Infusion Stop Time: 1456  Post Infusion IV Care: Observation period completed and Peripheral IV Discontinued  Discharge: Condition: Good, Destination: Home . AVS Provided  Performed by:  Leita FORBES Miles, LPN

## 2024-05-06 ENCOUNTER — Inpatient Hospital Stay

## 2024-05-06 ENCOUNTER — Inpatient Hospital Stay (HOSPITAL_BASED_OUTPATIENT_CLINIC_OR_DEPARTMENT_OTHER): Admitting: Internal Medicine

## 2024-05-06 VITALS — Temp 97.7°F

## 2024-05-06 VITALS — BP 98/83 | HR 83 | Temp 96.6°F | Resp 17 | Ht 72.0 in | Wt 172.9 lb

## 2024-05-06 DIAGNOSIS — C9000 Multiple myeloma not having achieved remission: Secondary | ICD-10-CM

## 2024-05-06 DIAGNOSIS — Z5112 Encounter for antineoplastic immunotherapy: Secondary | ICD-10-CM | POA: Diagnosis not present

## 2024-05-06 LAB — CBC WITH DIFFERENTIAL (CANCER CENTER ONLY)
Abs Immature Granulocytes: 0.04 K/uL (ref 0.00–0.07)
Basophils Absolute: 0.1 K/uL (ref 0.0–0.1)
Basophils Relative: 0 %
Eosinophils Absolute: 0.1 K/uL (ref 0.0–0.5)
Eosinophils Relative: 1 %
HCT: 41 % (ref 39.0–52.0)
Hemoglobin: 13.6 g/dL (ref 13.0–17.0)
Immature Granulocytes: 0 %
Lymphocytes Relative: 2 %
Lymphs Abs: 0.3 K/uL — ABNORMAL LOW (ref 0.7–4.0)
MCH: 32.8 pg (ref 26.0–34.0)
MCHC: 33.2 g/dL (ref 30.0–36.0)
MCV: 98.8 fL (ref 80.0–100.0)
Monocytes Absolute: 0.5 K/uL (ref 0.1–1.0)
Monocytes Relative: 5 %
Neutro Abs: 10.5 K/uL — ABNORMAL HIGH (ref 1.7–7.7)
Neutrophils Relative %: 92 %
Platelet Count: 175 K/uL (ref 150–400)
RBC: 4.15 MIL/uL — ABNORMAL LOW (ref 4.22–5.81)
RDW: 14.8 % (ref 11.5–15.5)
WBC Count: 11.4 K/uL — ABNORMAL HIGH (ref 4.0–10.5)
nRBC: 0 % (ref 0.0–0.2)

## 2024-05-06 LAB — CMP (CANCER CENTER ONLY)
ALT: 21 U/L (ref 0–44)
AST: 20 U/L (ref 15–41)
Albumin: 3.5 g/dL (ref 3.5–5.0)
Alkaline Phosphatase: 81 U/L (ref 38–126)
Anion gap: 5 (ref 5–15)
BUN: 16 mg/dL (ref 8–23)
CO2: 29 mmol/L (ref 22–32)
Calcium: 9.7 mg/dL (ref 8.9–10.3)
Chloride: 102 mmol/L (ref 98–111)
Creatinine: 1.31 mg/dL — ABNORMAL HIGH (ref 0.61–1.24)
GFR, Estimated: 55 mL/min — ABNORMAL LOW (ref >60)
Glucose, Bld: 140 mg/dL — ABNORMAL HIGH (ref 70–99)
Potassium: 4.4 mmol/L (ref 3.5–5.1)
Sodium: 136 mmol/L (ref 135–145)
Total Bilirubin: 1 mg/dL (ref 0.0–1.2)
Total Protein: 7 g/dL (ref 6.5–8.1)

## 2024-05-06 MED ORDER — ACETAMINOPHEN 325 MG PO TABS
650.0000 mg | ORAL_TABLET | Freq: Once | ORAL | Status: AC
  Administered 2024-05-06: 650 mg via ORAL
  Filled 2024-05-06: qty 2

## 2024-05-06 MED ORDER — DARATUMUMAB-HYALURONIDASE-FIHJ 1800-30000 MG-UT/15ML ~~LOC~~ SOLN
1800.0000 mg | Freq: Once | SUBCUTANEOUS | Status: AC
  Administered 2024-05-06: 1800 mg via SUBCUTANEOUS
  Filled 2024-05-06: qty 15

## 2024-05-06 MED ORDER — DIPHENHYDRAMINE HCL 25 MG PO CAPS
50.0000 mg | ORAL_CAPSULE | Freq: Once | ORAL | Status: AC
  Administered 2024-05-06: 50 mg via ORAL
  Filled 2024-05-06: qty 2

## 2024-05-06 NOTE — Progress Notes (Signed)
 [facility] [phone]   [phone]  OFFICE PROGRESS NOTE  Kip Righter, MD [address] [place] [address]  DIAGNOSIS:  1) Multiple myeloma, presented with plasmacytoma as well as other lytic lesions in the bone diagnosed in March 2024 2) right lower lobe pulmonary nodule suspicious for non-small cell lung cancer  PRIOR THERAPY: SBRT to the right lower lobe pulmonary nodule under the care of Dr. Patrcia.  CURRENT THERAPY:  1) Systemic chemotherapy with daratumumab  subcutaneously weekly in addition to Velcade  1.3 Mg/M2 on days 1, 4, 8, 11 every 3 weeks as well as Revlimid  25 mg p.o. daily for 14 days every 3 weeks and Decadron  20 mg p.o. weekly with chemotherapy.  First cycle expected on 12/05/2022.  Status post 20 cycles.  Velcade  and Revlimid  were discontinued secondary to significant peripheral neuropathy and pancytopenia.  He is currently on maintenance treatment with daratumumab  every 4 weeks in addition to Decadron  20 mg p.o. on weekly basis. 2) Zometa  4 mg IV infusion every 4 weeks.  Starting March 12, 2024 Zometa  will be giving every 12 weeks  INTERVAL HISTORY: Ronald Duran 83 y.o. male returns to the clinic today for follow-up visit accompanied by his wife. Discussed the use of AI scribe software for clinical note transcription with the patient, who gave verbal consent to proceed.  History of Present Illness Ronald Duran is an 83 year old male with multiple myeloma who presents for evaluation before starting cycle 21 of treatment. He is accompanied by his wife, who is his driver and general aid.  He was diagnosed with multiple myeloma in March 2024 and is currently undergoing treatment with subcutaneous daratumumab  and Decadron  every three weeks, having completed 20 cycles. He is here for evaluation before starting day one of cycle 21. Additionally, he receives Zometa  infusions every 12 weeks, with the next  scheduled for December.  He feels 'pretty stable' but experiences drowsiness, particularly after meals. He wonders if this is normal or related to his medications. He follows a routine of eating breakfast, typically Wheaties and fruit, and then resting in a [place]azy [place], often dozing off. No dizziness or other symptoms are present.  He is on metoprolol  for blood pressure management and has a scheduled appointment with his cardiologist.  He recently had an IVIG infusion last Wednesday and plans to receive COVID and flu vaccinations.    MEDICAL HISTORY: Past Medical History:  Diagnosis Date   Atrial fibrillation (HCC)    CAD (coronary artery disease)    a. s/p MI with LAD stent placement December, 1999 -last stress test November 2008 with no EKG evidence of ischemia  b. cath 07/23/2015 after abnormal ETT and nuc, occluded mid LAD stent with good distal collateral, medical therapy    Cardiomyopathy (HCC)    Ischemic cardiomyopathy with EF 40% by echo 2005   COVID 2021   mild case   Dyslipidemia    Erectile dysfunction    GERD (gastroesophageal reflux disease)    Hiatal hernia    with esophageal strictures and dilatations in the past - Dr. Princella Nida   History of nuclear stress test    Myoview 12/16: EF 47%, anterior, anteroseptal and inferoseptal defect suggesting infarct with peri-infarct ischemia, intermediate risk   HTN (hypertension)    Hx of seasonal allergies    Hyperlipidemia    Myocardial infarction (HCC) 08/01/1998   PAD (peripheral artery disease)    Pre-diabetes  Prostate cancer [facility])    with seed implant therapy following Lupron therapy in 2008 and 2009 - followed by Dr. Alline   Ringworm    Stroke [facility]) 04/20/2017   seen on a MRI   Vitamin D deficiency     ALLERGIES:  has no known allergies.  MEDICATIONS:  Current Outpatient Medications  Medication Sig Dispense Refill   acyclovir  (ZOVIRAX ) 200 MG capsule Take 1 capsule (200 mg total) by mouth 2 (two) times  daily. 60 capsule 5   apixaban  (ELIQUIS ) 5 MG TABS tablet Take 1 tablet (5 mg total) by mouth 2 (two) times daily. 60 tablet 0   atorvastatin  (LIPITOR) 40 MG tablet Take 40 mg by mouth at bedtime.     atorvastatin  (LIPITOR) 40 MG tablet 1 tablet Orally Once a day; Duration: 90 days     B Complex Vitamins (B COMPLEX 100) tablet Take 1 tablet by mouth daily. Balance     cetirizine  (ZYRTEC  ALLERGY) 10 MG tablet Take 1 tablet (10 mg total) by mouth daily. 30 tablet 0   Cholecalciferol (VITAMIN D3) 2000 UNITS TABS Take 2,000 Units by mouth daily.     ciclopirox  (PENLAC ) 8 % solution Apply topically at bedtime. Apply over nail and surrounding skin. Apply daily over previous coat. After seven (7) days, may remove with alcohol and continue cycle. 6.6 mL 2   clindamycin (CLEOCIN T) 1 % lotion Apply 1 % topically 2 (two) times daily.     Coenzyme Q10 (CO Q-10) 100 MG CAPS Take 100 mg by mouth daily with supper.     cyanocobalamin (VITAMIN B12) 1000 MCG tablet Take 2,500 mcg by mouth daily.     cyanocobalamin (VITAMIN B12) 1000 MCG tablet Take 2 tablets by mouth daily.     dexamethasone  (DECADRON ) 4 MG tablet TAKE 5 tabS weekly, start with the first dose of chemotherapy. 40 tablet 3   fluticasone (CUTIVATE) 0.05 % cream Apply 1 Application topically 2 (two) times daily. As needed     furosemide  (LASIX ) 40 MG tablet Take 1 tablet (40 mg total) by mouth 2 (two) times daily. 60 tablet 11   ketoconazole (NIZORAL) 2 % cream Apply 1 Application topically daily as needed for irritation.     metoprolol  tartrate (LOPRESSOR ) 25 MG tablet Take 1 tablet (25 mg total) by mouth 2 (two) times daily. 90 tablet 3   Multiple Vitamins-Minerals (CENTRUM) tablet Take 1 tablet by mouth daily.     nitroGLYCERIN  (NITROSTAT ) 0.4 MG SL tablet PLACE 1 TABLET UNDER THE TONGUE IF NEEDED EVERY 5 MINUTES FOR CHEST PAIN FOR 3 DOSES IF NO RELIEF AFTER FIRST DOSE CALL 911. 25 tablet 9   potassium chloride  SA (KLOR-CON  M) 20 MEQ tablet Take 1  tablet (20 mEq total) by mouth daily. 30 tablet 11   terbinafine (LAMISIL) 250 MG tablet Take 250 mg by mouth daily.     warfarin (COUMADIN ) 5 MG tablet TAKE 1/2 TO 1 TABLET BY MOUTH AS DIRECTED by coumadin  clinic 30 tablet 5   No current facility-administered medications for this visit.    SURGICAL HISTORY:  Past Surgical History:  Procedure Laterality Date   BRONCHIAL BIOPSY  11/15/2022   Procedure: BRONCHIAL BIOPSIES;  Surgeon: Brenna Adine CROME, DO;  Location: [facility] ENDOSCOPY;  Service: Pulmonary;;   BRONCHIAL BRUSHINGS  11/15/2022   Procedure: BRONCHIAL BRUSHINGS;  Surgeon: Brenna Adine CROME, DO;  Location: [facility] ENDOSCOPY;  Service: Pulmonary;;   BRONCHIAL NEEDLE ASPIRATION BIOPSY  11/15/2022   Procedure: BRONCHIAL NEEDLE ASPIRATION BIOPSIES;  Surgeon: Brenna Adine CROME, DO;  Location: [facility] ENDOSCOPY;  Service: Pulmonary;;   CARDIAC CATHETERIZATION N/A 07/23/2015   Procedure: Left Heart Cath and Coronary Angiography;  Surgeon: Victory LELON Sharps, MD;  Location: [facility] INVASIVE CV LAB;  Service: Cardiovascular;  Laterality: N/A;   CARDIOVERSION N/A 06/03/2022   Procedure: CARDIOVERSION;  Surgeon: Okey Vina GAILS, MD;  Location: [facility] ENDOSCOPY;  Service: Cardiovascular;  Laterality: N/A;   COLONOSCOPY WITH PROPOFOL  N/A 11/24/2015   Procedure: COLONOSCOPY WITH PROPOFOL ;  Surgeon: Gladis MARLA Louder, MD;  Location: [facility] ENDOSCOPY;  Service: Endoscopy;  Laterality: N/A;   esophageal strictures with dilatations in the past per Dr. Princella Nida     last '98   FIDUCIAL MARKER PLACEMENT  11/15/2022   Procedure: FIDUCIAL MARKER PLACEMENT;  Surgeon: Brenna Adine CROME, DO;  Location: [facility] ENDOSCOPY;  Service: Pulmonary;;   HERNIA REPAIR Left    INTRAMEDULLARY (IM) NAIL INTERTROCHANTERIC Left 10/25/2022   Procedure: LEFT INTRAMEDULLARY (IM) NAIL INTERTROCHANTERIC;  Surgeon: Jerri Kay HERO, MD;  Location: [facility] OR;  Service: Orthopedics;  Laterality: Left;   IR BONE MARROW BIOPSY & ASPIRATION  11/17/2022   IR BONE TUMOR(S)RF ABLATION   10/11/2022   IR KYPHO THORACIC WITH BONE BIOPSY  10/11/2022   LAD stent placement - Dr. Victory Sharps     left inguinal hernia repair - Dr. Ozell Gander     prostatic radioactive seed implantation - Dr. Jason     '09    REVIEW OF SYSTEMS:  Constitutional: positive for fatigue Eyes: negative Ears, nose, mouth, throat, and face: negative Respiratory: negative Cardiovascular: negative Gastrointestinal: negative Genitourinary:negative Integument/breast: negative Hematologic/lymphatic: negative Musculoskeletal:negative Neurological: positive for drowsy Behavioral/Psych: negative Endocrine: negative Allergic/Immunologic: negative   PHYSICAL EXAMINATION: General appearance: alert, cooperative, fatigued, and no distress Head: Normocephalic, without obvious abnormality, atraumatic Neck: no adenopathy, no JVD, supple, symmetrical, trachea midline, and thyroid not enlarged, symmetric, no tenderness/mass/nodules Lymph nodes: Cervical, supraclavicular, and axillary nodes normal. Resp: clear to auscultation bilaterally Back: symmetric, no curvature. ROM normal. No CVA tenderness. Cardio: irregularly irregular rhythm GI: soft, non-tender; bowel sounds normal; no masses,  no organomegaly Extremities: extremities normal, atraumatic, no cyanosis or edema Neurologic: Alert and oriented X 3, normal strength and tone. Normal symmetric reflexes. Normal coordination and gait  ECOG PERFORMANCE STATUS: 1 - Symptomatic but completely ambulatory  Blood pressure 98/83, pulse 83, temperature (!) 96.6 F (35.9 C), resp. rate 17, height 6' (1.829 m), weight 172 lb 14.4 oz (78.4 kg), SpO2 96%.  LABORATORY DATA: Lab Results  Component Value Date   WBC 11.4 (H) 05/06/2024   HGB 13.6 05/06/2024   HCT 41.0 05/06/2024   MCV 98.8 05/06/2024   PLT 175 05/06/2024      Chemistry      Component Value Date/Time   NA 137 04/09/2024 1034   NA 143 12/27/2023 0000   K 4.0 04/09/2024 1034   CL 104 04/09/2024 1034    CO2 29 04/09/2024 1034   BUN 16 04/09/2024 1034   BUN 20 12/27/2023 0000   CREATININE 1.18 04/09/2024 1034   CREATININE 1.23 (H) 07/21/2015 0917      Component Value Date/Time   CALCIUM  9.5 04/09/2024 1034   ALKPHOS 87 04/09/2024 1034   AST 19 04/09/2024 1034   ALT 27 04/09/2024 1034   BILITOT 1.2 04/09/2024 1034       RADIOGRAPHIC STUDIES: No results found.   ASSESSMENT AND PLAN: This is a very pleasant 83 years old white male with multiple myeloma presented with multiple lytic  lesions involving T10, left femoral neck as well as T4, T9 and T11 vertebral bodies. Also had right lower lobe lung nodule that was suspicious for non-small cell lung cancer. 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 The biopsy from the left hip was negative for malignancy. His myeloma panel showed no concerning findings except for elevated IgG but the serum light chains were normal. The patient underwent left hip fixation for impending fracture. He also underwent palliative radiotherapy under the care of Dr. Patrcia. His recent bone marrow biopsy and aspirate was consistent with plasma cell neoplasm with involvement of plasma cells 25% of the bone marrow. The patient is currently on treatment with daratumumab  and dexamethasone  started on 12/05/2022 status post 20 cycles.  Velcade  and Revlimid  were discontinued secondary to adverse effect and intolerance.  He has been tolerating his treatment with subcutaneous daratumumab  and Decadron  fairly well.   For the suspicious right lower lobe lung cancer, he underwent SBRT under the care of Dr. Patrcia. Assessment and Plan Assessment & Plan Multiple myeloma Multiple myeloma diagnosed in March 2024, currently on active treatment with subcutaneous daratumumab  and Decadron . He is asymptomatic, with no pain reported. He is on cycle 21 of treatment,  receiving daratumumab  and Decadron  every three weeks. Zometa  infusion is administered every 12 weeks, with the next dose due in December. 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 Recently received IVIG infusion last Wednesday. No adverse effects reported from the infusion, though he felt slightly more tired. - Monitor IgG levels with the next myeloma panel - Consider additional IVIG infusion based on IgG levels  Chronic hypotension Chronic hypotension possibly contributing to postprandial drowsiness. Currently taking metoprolol  and Lasix . Advised to discuss potential medication adjustments with his cardiologist, with an appointment scheduled for the day after tomorrow. - Discuss potential medication adjustments with cardiologist  Postprandial drowsiness Experiencing postprandial drowsiness, likely related to chronic hypotension and medication effects. Drowsiness occurs particularly after meals, with no associated dizziness or other symptoms. - Advise relaxing and hydrating more after meals He was advised to call immediately if he has any other concerning symptoms in the interval. The patient voices understanding of current disease status and treatment options and is in agreement with the current care plan.  All questions were answered. The patient knows to call the clinic with any problems, questions or concerns. We can certainly see the patient much sooner if necessary. The total time spent in the appointment was 30 minutes including review of chart and various tests results, discussions about plan of care and coordination of care plan .   Disclaimer: This note was dictated with voice recognition software. Similar sounding words can inadvertently be transcribed and may not be corrected upon  review.

## 2024-05-08 ENCOUNTER — Encounter: Payer: Self-pay | Admitting: Cardiovascular Disease

## 2024-05-08 ENCOUNTER — Other Ambulatory Visit: Payer: Self-pay

## 2024-05-08 ENCOUNTER — Ambulatory Visit: Attending: Internal Medicine | Admitting: Cardiovascular Disease

## 2024-05-08 VITALS — BP 74/50 | HR 72 | Ht 72.0 in | Wt 172.0 lb

## 2024-05-08 DIAGNOSIS — Z7901 Long term (current) use of anticoagulants: Secondary | ICD-10-CM | POA: Diagnosis not present

## 2024-05-08 DIAGNOSIS — C9000 Multiple myeloma not having achieved remission: Secondary | ICD-10-CM

## 2024-05-08 DIAGNOSIS — I255 Ischemic cardiomyopathy: Secondary | ICD-10-CM | POA: Diagnosis not present

## 2024-05-08 DIAGNOSIS — I4821 Permanent atrial fibrillation: Secondary | ICD-10-CM

## 2024-05-08 DIAGNOSIS — C3491 Malignant neoplasm of unspecified part of right bronchus or lung: Secondary | ICD-10-CM | POA: Diagnosis not present

## 2024-05-08 MED ORDER — FUROSEMIDE 40 MG PO TABS: 20.0000 mg | ORAL_TABLET | Freq: Every day | ORAL | Status: AC | PRN

## 2024-05-08 NOTE — Patient Instructions (Addendum)
 Medication Instructions:  Your physician recommends that you continue on your current medications as directed. Please refer to the Current Medication list given to you today.  Furosemide  (Lasix ) 20 mg changed to daily AS NEEDED for swelling/weight gain of 3+ lbs overnight or 5 lbs in 1 week.  *If you need a refill on your cardiac medications before your next appointment, please call your pharmacy*  Follow-Up: At [facility], you and your health needs are our priority.  As part of our continuing mission to provide you with exceptional heart care, our providers are all part of one team.  This team includes your primary Cardiologist (physician) and Advanced Practice Providers or APPs (Physician Assistants and Nurse Practitioners) who all work together to provide you with the care you need, when you need it.  Your next appointment:   3 months with Dr. Nancey 6 months with Dr. Delford  Provider:   Maude Delford, MD   We recommend signing up for the patient portal called MyChart.  Sign up information is provided on this After Visit Summary.  MyChart is used to connect with patients for Virtual Visits (Telemedicine).  Patients are able to view lab/test results, encounter notes, upcoming appointments, etc.  Non-urgent messages can be sent to your provider as well.    To learn more about what you can do with MyChart, go to [email or web address].

## 2024-05-09 MED ORDER — NITROGLYCERIN 0.4 MG SL SUBL: SUBLINGUAL_TABLET | SUBLINGUAL | 9 refills | Status: AC

## 2024-05-12 ENCOUNTER — Other Ambulatory Visit: Payer: Self-pay

## 2024-05-23 ENCOUNTER — Ambulatory Visit: Admitting: Podiatry

## 2024-05-23 DIAGNOSIS — Z7901 Long term (current) use of anticoagulants: Secondary | ICD-10-CM | POA: Diagnosis not present

## 2024-05-23 DIAGNOSIS — L84 Corns and callosities: Secondary | ICD-10-CM

## 2024-05-23 DIAGNOSIS — M79674 Pain in right toe(s): Secondary | ICD-10-CM

## 2024-05-23 DIAGNOSIS — B351 Tinea unguium: Secondary | ICD-10-CM | POA: Diagnosis not present

## 2024-05-23 DIAGNOSIS — M79675 Pain in left toe(s): Secondary | ICD-10-CM | POA: Diagnosis not present

## 2024-05-23 NOTE — Progress Notes (Signed)
 Subjective:   Patient ID: Gwenn VEAR Macadam, male   DOB: 83 y.o.   MRN: [id number]   HPI Chief Complaint  Patient presents with   RFC    Rm11 Routine foot care with ulceration on 2nd toe/patient says he is doing well.     83 year old male presents the office today with the above concerns.  He states the calluses are doing much better.  He does not report any ulcerations.  His nails are thick, elongated he cannot trim them himself and they cause discomfort.  He has no other concerns.   Review of Systems  All other systems reviewed and are negative.     Objective:  Physical Exam  General: AAO x3, NAD  Dermatological: There are minimal hyperkeratotic lesions noted right foot submetatarsal 1 and 5.  There is no underlying ulceration, drainage or any signs of infection. Toenails are hypertrophic, dystrophic, brittle, discolored, elongated 10. No surrounding redness or drainage. Tenderness nails 1-5 bilaterally.   Vascular: Dorsalis Pedis artery and Posterior Tibial artery pedal pulses are palpable bilateral with immedate capillary fill time. Chonic lower extremity edema   Neruologic: Grossly intact via light touch bilateral.   Musculoskeletal: Bunion deformity noted with second toe overlapping the hallux.  Prominent metatarsal head.     Assessment:   Preulcerative lesion right foot; symptomatic onychomycosis.  Plan:   Skin lesion right foot - Sharply debrided hyperkeratotic lesion x 2 on the right foot without any complications or bleeding.  Symptomatic onychomycosis - Sharply debrided nails x 10 without any complications or bleeding.  Return in about 2 months (around 07/23/2024).  Donnice JONELLE Fees DPM

## 2024-05-27 ENCOUNTER — Other Ambulatory Visit: Payer: Self-pay | Admitting: Internal Medicine

## 2024-05-27 DIAGNOSIS — C9000 Multiple myeloma not having achieved remission: Secondary | ICD-10-CM

## 2024-05-31 NOTE — Progress Notes (Signed)
 [facility] OFFICE PROGRESS NOTE  Kip Righter, MD [address] [place] [address]  DIAGNOSIS: 1) Multiple myeloma, presented with plasmacytoma as well as other lytic lesions in the bone diagnosed in March 2024 2) right lower lobe pulmonary nodule suspicious for non-small cell lung cancer  PRIOR THERAPY: SBRT to the right lower lobe pulmonary nodule under the care of Dr. Patrcia.   CURRENT THERAPY:  1) Systemic chemotherapy with daratumumab  subcutaneously weekly in addition to Velcade  1.3 Mg/M2 on days 1, 4, 8, 11 every 3 weeks as well as Revlimid  25 mg p.o. daily for 14 days every 3 weeks and Decadron  20 mg p.o. weekly with chemotherapy.  First cycle expected on 12/05/2022.  Status post 12 cycles. Revlimid  has been on hold since June/May 2024. Velcade  being held or cycle #7 due to PN and diarrhea.  2) Zometa  4 mg IV infusion.  Starting March 12, 2024 Zometa  will be giving every 12 weeks  3) IVIG PRN for hypogammaglobulinemia  INTERVAL HISTORY: Ronald Duran 83 y.o. male returns to the clinic today for a follow-up visit accompanied by his wife. The patient was last seen in the clinic by Dr. Sherrod on 05/06/24  The patient is currently on treatment with Darzalex  and Decadron . Velcade  and Revlimid  were discontinued due to side effects, cytopenias, and peripheral neuropathy.   He denies any major changes in his health since he was last seen. He has some questions about other upcoming appointments.   He is due for Zometa  today which he receives every 3 months.   He received IVIG last month. He is wondering if this is why his protein studies improved with his last CMP. He consumes a diet rich in protein, including salmon, but notes that his protein levels were low when relying solely on dietary intake.   He has stable neuropathy. He is no longer on velcade .  He is scheduled for a myeloma panel, with the last one conducted on August 26th, 2025. 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 He also has a future appointment with an electrophysiologist to discuss the possibility of an AFib ablation, previously postponed due to his cancer diagnosis.  He takes metoprolol , one whole tablet in the morning and half a tablet at night, as adjusted by his cardiologist. He was recently taken off Lasix , with instructions to resume if he experiences significant weight gain or swelling. He reports improved blood pressure (previously more hypotensive) and no current swelling issues. If he does get swelling, he wears his compression stockings.   He is on eliquis  for anticoagulation. He denies any recent fevers, infections, night sweats, upper respiratory symptoms, gastrointestinal symptoms, abnormal bleeding, or changes in bruising patterns.   He maintains a stable appetite and weight. He is up to date on his flu and covid vaccine.   He is here for evaluation and repeat blood work    MEDICAL HISTORY: Past Medical History:  Diagnosis Date   Atrial fibrillation (HCC)    CAD (coronary artery disease)    a. s/p MI with LAD stent placement December, 1999 -last stress test November 2008 with no EKG evidence of ischemia  b. cath 07/23/2015 after abnormal ETT and nuc, occluded mid LAD stent with good distal collateral, medical therapy    Cardiomyopathy (HCC)    Ischemic cardiomyopathy with EF 40% by echo 2005   COVID 2021  mild case   Dyslipidemia    Erectile dysfunction    GERD (gastroesophageal reflux disease)    Hiatal hernia    with esophageal strictures and dilatations in the past - Dr. Princella Nida   History of nuclear stress test    Myoview 12/16: EF 47%, anterior, anteroseptal and inferoseptal defect suggesting infarct with peri-infarct ischemia, intermediate risk   HTN (hypertension)    Hx of seasonal allergies    Hyperlipidemia    Myocardial  infarction (HCC) 08/01/1998   PAD (peripheral artery disease)    Pre-diabetes    Prostate cancer (HCC)    with seed implant therapy following Lupron therapy in 2008 and 2009 - followed by Dr. Alline   Ringworm    Stroke (HCC) 04/20/2017   seen on a MRI   Vitamin D deficiency     ALLERGIES:  has no known allergies.  MEDICATIONS:  Current Outpatient Medications  Medication Sig Dispense Refill   acyclovir  (ZOVIRAX ) 200 MG capsule Take 1 capsule (200 mg total) by mouth 2 (two) times daily. 60 capsule 5   apixaban  (ELIQUIS ) 5 MG TABS tablet Take 1 tablet (5 mg total) by mouth 2 (two) times daily. 60 tablet 0   atorvastatin  (LIPITOR) 40 MG tablet Take 40 mg by mouth at bedtime. (Patient not taking: Reported on 05/23/2024)     atorvastatin  (LIPITOR) 40 MG tablet 1 tablet Orally Once a day; Duration: 90 days     B Complex Vitamins (B COMPLEX 100) tablet Take 1 tablet by mouth daily. Balance     cetirizine  (ZYRTEC  ALLERGY) 10 MG tablet Take 1 tablet (10 mg total) by mouth daily. 30 tablet 0   Cholecalciferol (VITAMIN D3) 2000 UNITS TABS Take 2,000 Units by mouth daily.     ciclopirox  (PENLAC ) 8 % solution Apply topically at bedtime. Apply over nail and surrounding skin. Apply daily over previous coat. After seven (7) days, may remove with alcohol and continue cycle. (Patient not taking: Reported on 05/23/2024) 6.6 mL 2   clindamycin (CLEOCIN T) 1 % lotion Apply 1 % topically 2 (two) times daily.     Coenzyme Q10 (CO Q-10) 100 MG CAPS Take 100 mg by mouth daily with supper.     cyanocobalamin (VITAMIN B12) 1000 MCG tablet Take 2,500 mcg by mouth daily. (Patient not taking: Reported on 05/23/2024)     cyanocobalamin (VITAMIN B12) 1000 MCG tablet Take 2 tablets by mouth daily.     dexamethasone  (DECADRON ) 4 MG tablet TAKE 5 tabS weekly, start with the first dose of chemotherapy. 40 tablet 3   fluticasone (CUTIVATE) 0.05 % cream Apply 1 Application topically 2 (two) times daily. As needed      furosemide  (LASIX ) 40 MG tablet Take 0.5 tablets (20 mg total) by mouth daily as needed (swelling in legs/feet or weight gain of 3+ lbs overnight or 5 lbs in 1 week.).     ketoconazole (NIZORAL) 2 % cream Apply 1 Application topically daily as needed for irritation.     metoprolol  tartrate (LOPRESSOR ) 25 MG tablet Take 1 tablet (25 mg total) by mouth 2 (two) times daily. 90 tablet 3   Multiple Vitamins-Minerals (CENTRUM) tablet Take 1 tablet by mouth daily.     nitroGLYCERIN  (NITROSTAT ) 0.4 MG SL tablet PLACE 1 TABLET UNDER THE TONGUE IF NEEDED EVERY 5 MINUTES FOR CHEST PAIN FOR 3 DOSES IF NO RELIEF AFTER FIRST DOSE CALL 911. 25 tablet 9   potassium chloride  SA (KLOR-CON  M) 20 MEQ tablet Take 1 tablet (20  mEq total) by mouth daily. 30 tablet 11   terbinafine (LAMISIL) 250 MG tablet Take 250 mg by mouth daily. (Patient not taking: Reported on 05/23/2024)     No current facility-administered medications for this visit.    SURGICAL HISTORY:  Past Surgical History:  Procedure Laterality Date   BRONCHIAL BIOPSY  11/15/2022   Procedure: BRONCHIAL BIOPSIES;  Surgeon: Brenna Adine CROME, DO;  Location: [facility] ENDOSCOPY;  Service: Pulmonary;;   BRONCHIAL BRUSHINGS  11/15/2022   Procedure: BRONCHIAL BRUSHINGS;  Surgeon: Brenna Adine CROME, DO;  Location: [facility] ENDOSCOPY;  Service: Pulmonary;;   BRONCHIAL NEEDLE ASPIRATION BIOPSY  11/15/2022   Procedure: BRONCHIAL NEEDLE ASPIRATION BIOPSIES;  Surgeon: Brenna Adine CROME, DO;  Location: [facility] ENDOSCOPY;  Service: Pulmonary;;   CARDIAC CATHETERIZATION N/A 07/23/2015   Procedure: Left Heart Cath and Coronary Angiography;  Surgeon: Victory LELON Sharps, MD;  [facility] LAB;  Service: Cardiovascular;  Laterality: N/A;   CARDIOVERSION N/A 06/03/2022   Procedure: CARDIOVERSION;  Surgeon: Okey Vina GAILS, MD;  Location: [facility] ENDOSCOPY;  Service: Cardiovascular;  Laterality: N/A;   COLONOSCOPY WITH PROPOFOL  N/A 11/24/2015   Procedure: COLONOSCOPY WITH PROPOFOL ;  Surgeon: Gladis MARLA Louder, MD;  Location: [facility] ENDOSCOPY;  Service: Endoscopy;  Laterality: N/A;   esophageal strictures with dilatations in the past per Dr. Princella Nida     last '98   FIDUCIAL MARKER PLACEMENT  11/15/2022   Procedure: FIDUCIAL MARKER PLACEMENT;  Surgeon: Brenna Adine CROME, DO;  Location: [facility] ENDOSCOPY;  Service: Pulmonary;;   HERNIA REPAIR Left    INTRAMEDULLARY (IM) NAIL INTERTROCHANTERIC Left 10/25/2022   Procedure: LEFT INTRAMEDULLARY (IM) NAIL INTERTROCHANTERIC;  Surgeon: Jerri Kay HERO, MD;  Location: [facility] OR;  Service: Orthopedics;  Laterality: Left;   IR BONE MARROW BIOPSY & ASPIRATION  11/17/2022   IR BONE TUMOR(S)RF ABLATION  10/11/2022   IR KYPHO THORACIC WITH BONE BIOPSY  10/11/2022   LAD stent placement - Dr. Victory Sharps     left inguinal hernia repair - Dr. Ozell Gander     prostatic radioactive seed implantation - Dr. Jason     '09    REVIEW OF SYSTEMS:   Review of Systems  Constitutional: Positive for stable fatigue. Negative for appetite change, chills,  fever and unexpected weight change.  HENT: Negative for mouth sores, nosebleeds, sore throat and trouble swallowing.   Eyes: Negative for eye problems and icterus.  Respiratory: Negative for cough, hemoptysis, shortness of breath and wheezing.   Cardiovascular: Negative for chest pain. Improved lower extremity swelling.  Gastrointestinal: Negative for abdominal pain, constipation, diarrhea, nausea and vomiting.  Genitourinary: Negative for bladder incontinence, difficulty urinating, dysuria, frequency and hematuria.   Musculoskeletal: Positive for occasional chronic back. Negative for gait problem, neck pain and neck stiffness.  Skin: negative for rashes or itching.  Neurological: Negative for dizziness, extremity weakness, gait problem, headaches, light-headedness and seizures.  Hematological: Negative for adenopathy. Does not bleed easily. Positive for baseline bruising on upper extremities due to blood thinner use.   Psychiatric/Behavioral: Negative for confusion, depression and sleep disturbance. The patient is not nervous/anxious.    PHYSICAL EXAMINATION:  Blood pressure 97/80, pulse 83, temperature 98 F (36.7 C), temperature source Temporal, resp. rate 16, height 6' (1.829 m), weight 178 lb (80.7 kg), SpO2 100%.  ECOG PERFORMANCE STATUS: 1  Physical Exam  Constitutional: Oriented to person, place, and time and thin appearing male, and in no distress.  HENT:  Head: Normocephalic and atraumatic.  Mouth/Throat: Oropharynx is clear and  moist. No oropharyngeal exudate.  Eyes: Conjunctivae are normal. Right eye exhibits no discharge. Left eye exhibits no discharge. No scleral icterus.  Neck: Normal range of motion. Neck supple.  Cardiovascular: Normal rate, regular rhythm, normal heart sounds and intact distal pulses.   Pulmonary/Chest: Effort normal and breath sounds normal. No respiratory distress. No wheezes. No rales.  Abdominal: Soft. Bowel sounds are normal. Exhibits no distension and no mass. There is no tenderness.  Musculoskeletal: Normal range of motion. Lymphadenopathy:    No cervical adenopathy.  Neurological: Positive for tremor. Alert and oriented to person, place, and time. Exhibits normal muscle tone. Gait normal. Coordination normal. Uses a walker for ambulation Skin: Positive for bruising. Not diaphoretic. No pallor.  Psychiatric: Mood, memory and judgment normal.  Vitals reviewed.  LABORATORY DATA: Lab Results  Component Value Date   WBC 10.9 (H) 06/03/2024   HGB 13.4 06/03/2024   HCT 41.1 06/03/2024   MCV 98.8 06/03/2024   PLT 171 06/03/2024      Chemistry      Component Value Date/Time   NA 138 06/03/2024 1028   NA 143 12/27/2023 0000   K 4.1 06/03/2024 1028   CL 104 06/03/2024 1028   CO2 28 06/03/2024 1028   BUN 19 06/03/2024 1028   BUN 20 12/27/2023 0000   CREATININE 1.13 06/03/2024 1028   CREATININE 1.23 (H) 07/21/2015 0917      Component Value Date/Time    CALCIUM  9.4 06/03/2024 1028   ALKPHOS 77 06/03/2024 1028   AST 16 06/03/2024 1028   ALT 16 06/03/2024 1028   BILITOT 1.0 06/03/2024 1028       RADIOGRAPHIC STUDIES:  No results found.   ASSESSMENT/PLAN:  This is a very pleasant 83 year old Caucasian male with multiple myeloma presented with multiple lytic lesions involving T10, left femoral neck as well as T4, T9 and T11 vertebral bodies.    He also had right lower lobe lung nodule that was suspicious for non-small cell lung cancer.    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    he biopsy from the left hip was negative for malignancy. His myeloma panel showed no concerning findings except for elevated IgG but the serum light chains were normal. The patient underwent left hip fixation for impending fracture. He also underwent palliative radiotherapy under the care of Dr. Patrcia. His recent bone marrow biopsy and aspirate was consistent with plasma cell neoplasm with involvement of plasma cells 25% of the bone marrow.   The patient is currently on treatment with daratumumab , Velcade , Revlimid  and dexamethasone  started on 12/05/2022 status post 17 cycle. The patient has had treatment held periodically due to thrombocytopenia.  His dose of Revlimid  was reduced to 15 milligrams due to thrombocytopenia.  However, his Revlimid  was ultimately held starting from May/June 2024.    Velcade  was discontinued as the patient was recovering from diarrhea, hypotension, and dizziness.  He also was developing peripheral neuropathy.   Labs were reviewed.  Recommend he continue with dara every 4 weeks as scheduled today. He will proceed with treatment today as scheduled.    Will continue to receive Zometa  every 3 months. He is scheduled to receive this today.   I will add standing monthly QIG and we will use this to determine if he  requires IVIG.   I will arrange for repeat myeloma panel to be performed on 11/13 to  review at his next appointment next month.     We will see him back for a follow-up visit in 4 weeks before his next cycle of treatment.    Lung cancer Previously treated with SBRT. No symptoms of recurrence.  - Order surveillance CT scan every 6 months.    Easy bruising Easy bruising likely related to Eliquis . Bruising stable/slightly improved   Goals of Care Prioritizes myeloma treatment, cautious about additional procedures. - Coordinate with cardiologist and ophthalmologist on procedure necessity and timing. - Ensure procedures do not interfere with myeloma treatment schedule.  He will get the opinion of his cardiologist if they want to proceed with afib ablation which he was scheduled for in the past but it was cancelled due to his malignancy diagnosis.   The patient was advised to call immediately if she has any concerning symptoms in the interval. The patient voices understanding of current disease status and treatment options and is in agreement with the current care plan. All questions were answered. The patient knows to call the clinic with any problems, questions or concerns. We can certainly see the patient much sooner if necessary   Orders Placed This Encounter  Procedures   QIG  (Quant. immunoglobulins  - IgG, IgA, IgM)    Standing Status:   Standing    Number of Occurrences:   10    Expiration Date:   06/03/2025   QIG  (Quant. immunoglobulins  - IgG, IgA, IgM)    Standing Status:   Future    Expected Date:   06/20/2024    Expiration Date:   06/03/2025   Kappa/lambda light chains    Standing Status:   Future    Expected Date:   06/20/2024    Expiration Date:   06/03/2025   Beta 2 microglobulin, serum    Standing Status:   Future    Expected Date:   06/20/2024    Expiration Date:   06/03/2025   Lactate dehydrogenase (LDH)    Standing Status:   Future    Expected Date:    06/20/2024    Expiration Date:   06/03/2025    The total time spent in the appointment was 20-29 minutes  Mustapha Colson L Kerah Hardebeck, PA-C 06/03/24

## 2024-06-03 ENCOUNTER — Inpatient Hospital Stay

## 2024-06-03 ENCOUNTER — Inpatient Hospital Stay: Attending: Internal Medicine

## 2024-06-03 ENCOUNTER — Inpatient Hospital Stay: Admitting: Physician Assistant

## 2024-06-03 VITALS — BP 97/80 | HR 83 | Temp 98.0°F | Resp 16 | Ht 72.0 in | Wt 178.0 lb

## 2024-06-03 DIAGNOSIS — C9000 Multiple myeloma not having achieved remission: Secondary | ICD-10-CM

## 2024-06-03 DIAGNOSIS — Z79620 Long term (current) use of immunosuppressive biologic: Secondary | ICD-10-CM | POA: Insufficient documentation

## 2024-06-03 DIAGNOSIS — Z5112 Encounter for antineoplastic immunotherapy: Secondary | ICD-10-CM | POA: Insufficient documentation

## 2024-06-03 LAB — CBC WITH DIFFERENTIAL (CANCER CENTER ONLY)
Abs Immature Granulocytes: 0.08 K/uL — ABNORMAL HIGH (ref 0.00–0.07)
Basophils Absolute: 0.1 K/uL (ref 0.0–0.1)
Basophils Relative: 1 %
Eosinophils Absolute: 0.1 K/uL (ref 0.0–0.5)
Eosinophils Relative: 1 %
HCT: 41.1 % (ref 39.0–52.0)
Hemoglobin: 13.4 g/dL (ref 13.0–17.0)
Immature Granulocytes: 1 %
Lymphocytes Relative: 3 %
Lymphs Abs: 0.4 K/uL — ABNORMAL LOW (ref 0.7–4.0)
MCH: 32.2 pg (ref 26.0–34.0)
MCHC: 32.6 g/dL (ref 30.0–36.0)
MCV: 98.8 fL (ref 80.0–100.0)
Monocytes Absolute: 0.5 K/uL (ref 0.1–1.0)
Monocytes Relative: 5 %
Neutro Abs: 9.8 K/uL — ABNORMAL HIGH (ref 1.7–7.7)
Neutrophils Relative %: 89 %
Platelet Count: 171 K/uL (ref 150–400)
RBC: 4.16 MIL/uL — ABNORMAL LOW (ref 4.22–5.81)
RDW: 15.3 % (ref 11.5–15.5)
WBC Count: 10.9 K/uL — ABNORMAL HIGH (ref 4.0–10.5)
nRBC: 0 % (ref 0.0–0.2)

## 2024-06-03 LAB — CMP (CANCER CENTER ONLY)
ALT: 16 U/L (ref 0–44)
AST: 16 U/L (ref 15–41)
Albumin: 3.4 g/dL — ABNORMAL LOW (ref 3.5–5.0)
Alkaline Phosphatase: 77 U/L (ref 38–126)
Anion gap: 6 (ref 5–15)
BUN: 19 mg/dL (ref 8–23)
CO2: 28 mmol/L (ref 22–32)
Calcium: 9.4 mg/dL (ref 8.9–10.3)
Chloride: 104 mmol/L (ref 98–111)
Creatinine: 1.13 mg/dL (ref 0.61–1.24)
GFR, Estimated: >60 mL/min (ref >60)
Glucose, Bld: 115 mg/dL — ABNORMAL HIGH (ref 70–99)
Potassium: 4.1 mmol/L (ref 3.5–5.1)
Sodium: 138 mmol/L (ref 135–145)
Total Bilirubin: 1 mg/dL (ref 0.0–1.2)
Total Protein: 6.4 g/dL — ABNORMAL LOW (ref 6.5–8.1)

## 2024-06-03 MED ORDER — DARATUMUMAB-HYALURONIDASE-FIHJ 1800-30000 MG-UT/15ML ~~LOC~~ SOLN
1800.0000 mg | Freq: Once | SUBCUTANEOUS | Status: AC
  Administered 2024-06-03: 1800 mg via SUBCUTANEOUS
  Filled 2024-06-03: qty 15

## 2024-06-03 MED ORDER — ACETAMINOPHEN 325 MG PO TABS
650.0000 mg | ORAL_TABLET | Freq: Once | ORAL | Status: AC
  Administered 2024-06-03: 650 mg via ORAL
  Filled 2024-06-03: qty 2

## 2024-06-03 MED ORDER — ZOLEDRONIC ACID 4 MG/100ML IV SOLN
4.0000 mg | Freq: Once | INTRAVENOUS | Status: AC
  Administered 2024-06-03: 4 mg via INTRAVENOUS
  Filled 2024-06-03: qty 100

## 2024-06-03 MED ORDER — DIPHENHYDRAMINE HCL 25 MG PO CAPS
50.0000 mg | ORAL_CAPSULE | Freq: Once | ORAL | Status: AC
  Administered 2024-06-03: 50 mg via ORAL
  Filled 2024-06-03: qty 2

## 2024-06-03 MED ORDER — SODIUM CHLORIDE 0.9 % IV SOLN: INTRAVENOUS | Status: DC

## 2024-06-03 NOTE — Patient Instructions (Signed)
 [facility] - A DEPT OF [facility]  Discharge Instructions: Thank you for choosing [facility] to provide your oncology and hematology care.   If you have a lab appointment with the Cancer Center, please go directly to the Cancer Center and check in at the registration area.   Wear comfortable clothing and clothing appropriate for easy access to any Portacath or PICC line.   We strive to give you quality time with your provider. You may need to reschedule your appointment if you arrive late (15 or more minutes).  Arriving late affects you and other patients whose appointments are after yours.  Also, if you miss three or more appointments without notifying the office, you may be dismissed from the clinic at the provider's discretion.      For prescription refill requests, have your pharmacy contact our office and allow 72 hours for refills to be completed.    Today you received the following chemotherapy and/or immunotherapy agents: Darzalex Faspro, Zometa   To help prevent nausea and vomiting after your treatment, we encourage you to take your nausea medication as directed.  BELOW ARE SYMPTOMS THAT SHOULD BE REPORTED IMMEDIATELY: *FEVER GREATER THAN 100.4 F (38 C) OR HIGHER *CHILLS OR SWEATING *NAUSEA AND VOMITING THAT IS NOT CONTROLLED WITH YOUR NAUSEA MEDICATION *UNUSUAL SHORTNESS OF BREATH *UNUSUAL BRUISING OR BLEEDING *URINARY PROBLEMS (pain or burning when urinating, or frequent urination) *BOWEL PROBLEMS (unusual diarrhea, constipation, pain near the anus) TENDERNESS IN MOUTH AND THROAT WITH OR WITHOUT PRESENCE OF ULCERS (sore throat, sores in mouth, or a toothache) UNUSUAL RASH, SWELLING OR PAIN  UNUSUAL VAGINAL DISCHARGE OR ITCHING   Items with * indicate a potential emergency and should be followed up as soon as possible or go to the Emergency Department if any problems should occur.  Please show the CHEMOTHERAPY ALERT CARD or  IMMUNOTHERAPY ALERT CARD at check-in to the Emergency Department and triage nurse.  Should you have questions after your visit or need to cancel or reschedule your appointment, please contact [facility] - A DEPT OF [facility]  Dept: [phone]  and follow the prompts.  Office hours are 8:00 a.m. to 4:30 p.m. Monday - Friday. Please note that voicemails left after 4:00 p.m. may not be returned until the following business day.  We are closed weekends and major holidays. You have access to a nurse at all times for urgent questions. Please call the main number to the clinic Dept: [phone] and follow the prompts.   For any non-urgent questions, you may also contact your provider using MyChart. We now offer e-Visits for anyone 73 and older to request care online for non-urgent symptoms. For details visit [email or web address].   Also download the MyChart app! Go to the app store, search "MyChart", open the app, select [place], and log in with your MyChart username and password.

## 2024-06-04 ENCOUNTER — Telehealth: Payer: Self-pay | Admitting: Internal Medicine

## 2024-06-04 NOTE — Telephone Encounter (Signed)
 Scheduled patient for lab appointment 11/13. Called and spoke with the patient, he is aware.

## 2024-06-17 ENCOUNTER — Encounter: Payer: Self-pay | Admitting: Internal Medicine

## 2024-06-17 ENCOUNTER — Other Ambulatory Visit: Payer: Self-pay | Admitting: Internal Medicine

## 2024-06-19 ENCOUNTER — Other Ambulatory Visit: Payer: Self-pay | Admitting: Physician Assistant

## 2024-06-19 DIAGNOSIS — C9000 Multiple myeloma not having achieved remission: Secondary | ICD-10-CM

## 2024-06-20 ENCOUNTER — Inpatient Hospital Stay: Attending: Internal Medicine

## 2024-06-20 DIAGNOSIS — Z79620 Long term (current) use of immunosuppressive biologic: Secondary | ICD-10-CM | POA: Diagnosis not present

## 2024-06-20 DIAGNOSIS — Z5112 Encounter for antineoplastic immunotherapy: Secondary | ICD-10-CM | POA: Insufficient documentation

## 2024-06-20 DIAGNOSIS — D801 Nonfamilial hypogammaglobulinemia: Secondary | ICD-10-CM | POA: Diagnosis not present

## 2024-06-20 DIAGNOSIS — C9000 Multiple myeloma not having achieved remission: Secondary | ICD-10-CM | POA: Insufficient documentation

## 2024-06-20 LAB — CMP (CANCER CENTER ONLY)
ALT: 33 U/L (ref 0–44)
AST: 25 U/L (ref 15–41)
Albumin: 3.5 g/dL (ref 3.5–5.0)
Alkaline Phosphatase: 84 U/L (ref 38–126)
Anion gap: 5 (ref 5–15)
BUN: 20 mg/dL (ref 8–23)
CO2: 28 mmol/L (ref 22–32)
Calcium: 9.7 mg/dL (ref 8.9–10.3)
Chloride: 106 mmol/L (ref 98–111)
Creatinine: 1.19 mg/dL (ref 0.61–1.24)
GFR, Estimated: >60 mL/min (ref >60)
Glucose, Bld: 87 mg/dL (ref 70–99)
Potassium: 4 mmol/L (ref 3.5–5.1)
Sodium: 139 mmol/L (ref 135–145)
Total Bilirubin: 1 mg/dL (ref 0.0–1.2)
Total Protein: 6.2 g/dL — ABNORMAL LOW (ref 6.5–8.1)

## 2024-06-20 LAB — CBC WITH DIFFERENTIAL (CANCER CENTER ONLY)
Abs Immature Granulocytes: 0.07 K/uL (ref 0.00–0.07)
Basophils Absolute: 0.1 K/uL (ref 0.0–0.1)
Basophils Relative: 1 %
Eosinophils Absolute: 0.1 K/uL (ref 0.0–0.5)
Eosinophils Relative: 1 %
HCT: 41.8 % (ref 39.0–52.0)
Hemoglobin: 13.5 g/dL (ref 13.0–17.0)
Immature Granulocytes: 1 %
Lymphocytes Relative: 7 %
Lymphs Abs: 0.8 K/uL (ref 0.7–4.0)
MCH: 31.7 pg (ref 26.0–34.0)
MCHC: 32.3 g/dL (ref 30.0–36.0)
MCV: 98.1 fL (ref 80.0–100.0)
Monocytes Absolute: 1.1 K/uL — ABNORMAL HIGH (ref 0.1–1.0)
Monocytes Relative: 10 %
Neutro Abs: 8.8 K/uL — ABNORMAL HIGH (ref 1.7–7.7)
Neutrophils Relative %: 80 %
Platelet Count: 178 K/uL (ref 150–400)
RBC: 4.26 MIL/uL (ref 4.22–5.81)
RDW: 15.4 % (ref 11.5–15.5)
WBC Count: 10.9 K/uL — ABNORMAL HIGH (ref 4.0–10.5)
nRBC: 0 % (ref 0.0–0.2)

## 2024-06-20 LAB — LACTATE DEHYDROGENASE: LDH: 125 U/L (ref 105–235)

## 2024-06-21 LAB — BETA 2 MICROGLOBULIN, SERUM: Beta-2 Microglobulin: 2.2 mg/L (ref 0.6–2.4)

## 2024-06-21 LAB — KAPPA/LAMBDA LIGHT CHAINS
Kappa free light chain: 3.1 mg/L — ABNORMAL LOW (ref 3.3–19.4)
Kappa, lambda light chain ratio: 1.41 (ref 0.26–1.65)
Lambda free light chains: 2.2 mg/L — ABNORMAL LOW (ref 5.7–26.3)

## 2024-06-22 LAB — IGG, IGA, IGM
IgA: 9 mg/dL — ABNORMAL LOW (ref 61–437)
IgG (Immunoglobin G), Serum: 732 mg/dL (ref 603–1613)
IgM (Immunoglobulin M), Srm: 16 mg/dL (ref 15–143)

## 2024-06-28 NOTE — Progress Notes (Unsigned)
 [facility] OFFICE PROGRESS NOTE  Kip Righter, MD [address] [place] [address]  DIAGNOSIS:  1) Multiple myeloma, presented with plasmacytoma as well as other lytic lesions in the bone diagnosed in March 2024 2) right lower lobe pulmonary nodule suspicious for non-small cell lung cancer  PRIOR THERAPY: SBRT to the right lower lobe pulmonary nodule under the care of Dr. Patrcia. April 2024  CURRENT THERAPY:  1) Systemic chemotherapy with daratumumab  subcutaneously weekly in addition to Velcade  1.3 Mg/M2 on days 1, 4, 8, 11 every 3 weeks as well as Revlimid  25 mg p.o. daily for 14 days every 3 weeks and Decadron  20 mg p.o. weekly with chemotherapy.  First cycle expected on 12/05/2022.  Status post 22 cycles. Revlimid  has been on hold since June/May 2024. Velcade  being held or cycle #7 due to PN and diarrhea.  2) Zometa  4 mg IV infusion.  Zometa  will be giving every 12 weeks. Most recent dose on 06/03/24.  3) IVIG PRN for hypogammaglobulinemia  INTERVAL HISTORY: Ronald Duran 83 y.o. male returns to the clinic today for a follow-up visit accompanied by his wife. The patient was last seen in the clinic by myself on 06/03/24.    The patient is currently on treatment with Darzalex  and Decadron . Velcade  and Revlimid  were discontinued due to side effects, cytopenias, and peripheral neuropathy.    He denies any major changes in his health since he was last seen except he recently was found to have URI. He was given doxycycline  and mucinex .    The symptoms initially resolved but reappeared yesterday, presenting as clear nasal drainage and a mild cough. The symptoms are less severe than before. No fever, chills, or night sweats.   He previously received IVIG treatment, which improved his immunoglobulins as evidenced by lab results.   He receives Zometa  every 3 months. He is not due for this today.    He receives IVIG for hypoglobulinemia. He consumes a  diet rich in protein, including salmon, but notes that his protein levels were low when relying solely on dietary intake.   He has stable neuropathy. He is no longer on velcade .  He reports that he is scheduled to have cataract surgery in January 2026.  He also is wondering if he can have an ablation if recommended by his electrophysiologist.   He is on eliquis  for anticoagulation. He denies any recent fevers, infections (besides the nasal congestion), night sweats, gastrointestinal symptoms, abnormal bleeding, or changes in bruising patterns.   He maintains a stable appetite and weight. He recently had repeat myeloma labs performed.    He is here for evaluation and repeat blood work  MEDICAL HISTORY: Past Medical History:  Diagnosis Date   Atrial fibrillation (HCC)    CAD (coronary artery disease)    a. s/p MI with LAD stent placement December, 1999 -last stress test November 2008 with no EKG evidence of ischemia  b. cath 07/23/2015 after abnormal ETT and nuc, occluded mid LAD stent with good distal collateral, medical therapy    Cardiomyopathy (HCC)    Ischemic cardiomyopathy with EF 40% by echo 2005   COVID 2021   mild case   Dyslipidemia    Erectile dysfunction    GERD (gastroesophageal reflux disease)    Hiatal hernia    with esophageal strictures and dilatations in the past - Dr. Princella Nida   History of nuclear stress test    Myoview 12/16: EF 47%, anterior, anteroseptal and  inferoseptal defect suggesting infarct with peri-infarct ischemia, intermediate risk   HTN (hypertension)    Hx of seasonal allergies    Hyperlipidemia    Myocardial infarction (HCC) 08/01/1998   PAD (peripheral artery disease)    Pre-diabetes    Prostate cancer (HCC)    with seed implant therapy following Lupron therapy in 2008 and 2009 - followed by Dr. Alline   Ringworm    Stroke [facility]) 04/20/2017   seen on a MRI   Vitamin D deficiency     ALLERGIES:  has no known allergies.  MEDICATIONS:   Current Outpatient Medications  Medication Sig Dispense Refill   acyclovir  (ZOVIRAX ) 200 MG capsule TAKE ONE CAPSULE BY MOUTH TWICE DAILY 60 capsule 5   apixaban  (ELIQUIS ) 5 MG TABS tablet Take 1 tablet (5 mg total) by mouth 2 (two) times daily. 60 tablet 0   atorvastatin  (LIPITOR) 40 MG tablet Take 40 mg by mouth at bedtime. (Patient not taking: Reported on 05/23/2024)     atorvastatin  (LIPITOR) 40 MG tablet 1 tablet Orally Once a day; Duration: 90 days     B Complex Vitamins (B COMPLEX 100) tablet Take 1 tablet by mouth daily. Balance     cetirizine  (ZYRTEC  ALLERGY) 10 MG tablet Take 1 tablet (10 mg total) by mouth daily. 30 tablet 0   Cholecalciferol (VITAMIN D3) 2000 UNITS TABS Take 2,000 Units by mouth daily.     ciclopirox  (PENLAC ) 8 % solution Apply topically at bedtime. Apply over nail and surrounding skin. Apply daily over previous coat. After seven (7) days, may remove with alcohol and continue cycle. (Patient not taking: Reported on 05/23/2024) 6.6 mL 2   clindamycin (CLEOCIN T) 1 % lotion Apply 1 % topically 2 (two) times daily.     Coenzyme Q10 (CO Q-10) 100 MG CAPS Take 100 mg by mouth daily with supper.     cyanocobalamin (VITAMIN B12) 1000 MCG tablet Take 2,500 mcg by mouth daily. (Patient not taking: Reported on 05/23/2024)     cyanocobalamin (VITAMIN B12) 1000 MCG tablet Take 2 tablets by mouth daily.     dexamethasone  (DECADRON ) 4 MG tablet TAKE 5 tabS weekly, start with the first dose of chemotherapy. 40 tablet 3   fluticasone (CUTIVATE) 0.05 % cream Apply 1 Application topically 2 (two) times daily. As needed     furosemide  (LASIX ) 40 MG tablet Take 0.5 tablets (20 mg total) by mouth daily as needed (swelling in legs/feet or weight gain of 3+ lbs overnight or 5 lbs in 1 week.).     ketoconazole (NIZORAL) 2 % cream Apply 1 Application topically daily as needed for irritation.     metoprolol  tartrate (LOPRESSOR ) 25 MG tablet Take 1 tablet (25 mg total) by mouth 2 (two) times  daily. 90 tablet 3   Multiple Vitamins-Minerals (CENTRUM) tablet Take 1 tablet by mouth daily.     nitroGLYCERIN  (NITROSTAT ) 0.4 MG SL tablet PLACE 1 TABLET UNDER THE TONGUE IF NEEDED EVERY 5 MINUTES FOR CHEST PAIN FOR 3 DOSES IF NO RELIEF AFTER FIRST DOSE CALL 911. 25 tablet 9   potassium chloride  SA (KLOR-CON  M) 20 MEQ tablet Take 1 tablet (20 mEq total) by mouth daily. 30 tablet 11   terbinafine (LAMISIL) 250 MG tablet Take 250 mg by mouth daily. (Patient not taking: Reported on 05/23/2024)     No current facility-administered medications for this visit.    SURGICAL HISTORY:  Past Surgical History:  Procedure Laterality Date   BRONCHIAL BIOPSY  11/15/2022  Procedure: BRONCHIAL BIOPSIES;  Surgeon: Brenna Adine CROME, DO;  Location: [facility] ENDOSCOPY;  Service: Pulmonary;;   BRONCHIAL BRUSHINGS  11/15/2022   Procedure: BRONCHIAL BRUSHINGS;  Surgeon: Brenna Adine CROME, DO;  Location: [facility] ENDOSCOPY;  Service: Pulmonary;;   BRONCHIAL NEEDLE ASPIRATION BIOPSY  11/15/2022   Procedure: BRONCHIAL NEEDLE ASPIRATION BIOPSIES;  Surgeon: Brenna Adine CROME, DO;  Location: [facility] ENDOSCOPY;  Service: Pulmonary;;   CARDIAC CATHETERIZATION N/A 07/23/2015   Procedure: Left Heart Cath and Coronary Angiography;  Surgeon: Victory LELON Sharps, MD;  Location: [facility] INVASIVE CV LAB;  Service: Cardiovascular;  Laterality: N/A;   CARDIOVERSION N/A 06/03/2022   Procedure: CARDIOVERSION;  Surgeon: Okey Vina GAILS, MD;  Location: [facility] ENDOSCOPY;  Service: Cardiovascular;  Laterality: N/A;   COLONOSCOPY WITH PROPOFOL  N/A 11/24/2015   Procedure: COLONOSCOPY WITH PROPOFOL ;  Surgeon: Gladis MARLA Louder, MD;  Location: [facility] ENDOSCOPY;  Service: Endoscopy;  Laterality: N/A;   esophageal strictures with dilatations in the past per Dr. Princella Nida     last '98   FIDUCIAL MARKER PLACEMENT  11/15/2022   Procedure: FIDUCIAL MARKER PLACEMENT;  Surgeon: Brenna Adine CROME, DO;  Location: [facility] ENDOSCOPY;  Service: Pulmonary;;   HERNIA REPAIR Left    INTRAMEDULLARY (IM)  NAIL INTERTROCHANTERIC Left 10/25/2022   Procedure: LEFT INTRAMEDULLARY (IM) NAIL INTERTROCHANTERIC;  Surgeon: Jerri Kay HERO, MD;  Location: [facility] OR;  Service: Orthopedics;  Laterality: Left;   IR BONE MARROW BIOPSY & ASPIRATION  11/17/2022   IR BONE TUMOR(S)RF ABLATION  10/11/2022   IR KYPHO THORACIC WITH BONE BIOPSY  10/11/2022   LAD stent placement - Dr. Victory Sharps     left inguinal hernia repair - Dr. Ozell Gander     prostatic radioactive seed implantation - Dr. Jason     '09    REVIEW OF SYSTEMS:   Review of Systems  Constitutional: Positive for stable fatigue. Negative for appetite change, chills,  fever and unexpected weight change.  HENT: Positive for nasal congestion. Negative for mouth sores, nosebleeds, sore throat and trouble swallowing.   Eyes: Negative for eye problems and icterus.  Respiratory: Negative for cough, hemoptysis, shortness of breath and wheezing.   Cardiovascular: Negative for chest pain. Improved lower extremity swelling.  Gastrointestinal: Negative for abdominal pain, constipation, diarrhea, nausea and vomiting.  Genitourinary: Negative for bladder incontinence, difficulty urinating, dysuria, frequency and hematuria.   Musculoskeletal: Negative for gait problem, neck pain and neck stiffness.  Skin: negative for rashes or itching.  Neurological: Negative for dizziness, extremity weakness, gait problem, headaches, light-headedness and seizures.  Hematological: Negative for adenopathy. Does not bleed easily. Positive for baseline bruising on upper extremities due to blood thinner use.  Psychiatric/Behavioral: Negative for confusion, depression and sleep disturbance. The patient is not nervous/anxious.    PHYSICAL EXAMINATION:  There were no vitals taken for this visit.  ECOG PERFORMANCE STATUS: 1  Physical Exam  Constitutional: Oriented to person, place, and time and thin appearing male, and in no distress.  HENT:  Head: Normocephalic and atraumatic.   Mouth/Throat: Oropharynx is clear and moist. No oropharyngeal exudate.  Eyes: Conjunctivae are normal. Right eye exhibits no discharge. Left eye exhibits no discharge. No scleral icterus.  Neck: Normal range of motion. Neck supple.  Cardiovascular: Normal rate, regular rhythm, normal heart sounds and intact distal pulses.   Pulmonary/Chest: Effort normal and breath sounds normal. No respiratory distress. No wheezes. No rales.  Abdominal: Soft. Bowel sounds are normal. Exhibits no distension and no mass. There is no tenderness.  Musculoskeletal: Normal  range of motion. Lymphadenopathy:    No cervical adenopathy.  Neurological: Positive for tremor. Alert and oriented to person, place, and time. Exhibits normal muscle tone. Gait normal. Coordination normal. Uses a walker for ambulation Skin: Positive for bruising. Not diaphoretic. No pallor.  Psychiatric: Mood, memory and judgment normal.  Vitals reviewed.  LABORATORY DATA: Lab Results  Component Value Date   WBC 10.9 (H) 06/20/2024   HGB 13.5 06/20/2024   HCT 41.8 06/20/2024   MCV 98.1 06/20/2024   PLT 178 06/20/2024      Chemistry      Component Value Date/Time   NA 139 06/20/2024 1413   NA 143 12/27/2023 0000   K 4.0 06/20/2024 1413   CL 106 06/20/2024 1413   CO2 28 06/20/2024 1413   BUN 20 06/20/2024 1413   BUN 20 12/27/2023 0000   CREATININE 1.19 06/20/2024 1413   CREATININE 1.23 (H) 07/21/2015 0917      Component Value Date/Time   CALCIUM  9.7 06/20/2024 1413   ALKPHOS 84 06/20/2024 1413   AST 25 06/20/2024 1413   ALT 33 06/20/2024 1413   BILITOT 1.0 06/20/2024 1413       RADIOGRAPHIC STUDIES:  No results found.   ASSESSMENT/PLAN:  This is a very pleasant 83 year old Caucasian male with multiple myeloma presented with multiple lytic lesions involving T10, left femoral neck as well as T4, T9 and T11 vertebral bodies.    He also had right lower lobe lung nodule that was suspicious for non-small cell lung  cancer.    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    he biopsy from the left hip was negative for malignancy. His myeloma panel showed no concerning findings except for elevated IgG but the serum light chains were normal. The patient underwent left hip fixation for impending fracture. He also underwent palliative radiotherapy under the care of Dr. Patrcia. His recent bone marrow biopsy and aspirate was consistent with plasma cell neoplasm with involvement of plasma cells 25% of the bone marrow.   The patient is currently on treatment with daratumumab , Velcade , Revlimid  and dexamethasone  started on 12/05/2022 status post 17 cycle. The patient has had treatment held periodically due to thrombocytopenia.  His dose of Revlimid  was reduced to 15 milligrams due to thrombocytopenia.  However, his Revlimid  was ultimately held starting from May/June 2024.    Velcade  was discontinued as the patient was recovering from diarrhea, hypotension, and dizziness.  He also was developing peripheral neuropathy.  He recently had repeat myeloma labs performed. He was seen with Dr. Sherrod who personally and independently reviewed the myeloma panel. The labs showed no evidence of disease progression.    Labs were reviewed.  Recommend he continue with dara every 4 weeks as scheduled today. He will proceed with treatment today as scheduled.    Will continue to receive Zometa  every 3 months. He last received this on 10/27/  25 I will add standing monthly QIG and we will use this to determine if he requires IVIG. He does not need any at this time.    We will see him back for a follow-up visit in 4 weeks before his next cycle of treatment.    Lung cancer Previously treated with SBRT. No symptoms of recurrence.  - Order surveillance CT scan every 12 months per Dr. Sherrod.     Easy bruising Easy  bruising likely related  to Eliquis . Bruising stable.    We let the patient know that is okay to proceed with his cataract surgery.  Additionally if he is required to undergo any cardiac procedures for his A-fib that is okay from our standpoint.  The patient recently had sinusitis which improved with antibiotics.  He started having some nasal congestion for 1 day.  Overall it is pretty mild.  - Due to the upcoming holiday and offices being closed and the fact he may be immunocompromised, we will send doxycycline  to the pharmacy that he will only fill if he has worsening symptoms.  Otherwise he will continue to take Mucinex , antihistamines, and Flonase if needed.  The patient was advised to call immediately if he has any concerning symptoms in the interval. The patient voices understanding of current disease status and treatment options and is in agreement with the current care plan. All questions were answered. The patient knows to call the clinic with any problems, questions or concerns. We can certainly see the patient much sooner if necessary   No orders of the defined types were placed in this encounter.     Ronald Duran L Ronald Crosson, PA-C 06/28/24  ADDENDUM: Hematology/Oncology Attending: I had a face-to-face encounter with the patient today.  I reviewed his record, lab and recommended his care plan.  This is a very pleasant 83 years old white male with history of multiple myeloma diagnosed in March 2024.  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  He started systemic therapy for the multiple myeloma including subcutaneous daratumumab , Velcade , Revlimid  and Decadron  every 3 weeks status post 22 cycles his treatment with Velcade  and Revlimid  are currently on hold.  He continued his treatment with subcutaneous daratumumab  and Decadron  as maintenance.  He has been tolerating this treatment fairly well.  He had repeat myeloma panel  performed recently I discussed the lab result with the patient and his wife and that showed no concerning findings for disease progression. For the hypogammaglobinemia we will continue supporting the patient with IVIG on monthly basis depending on his IgG level to keep it above 500. The patient will proceed with cycle #23 today and he will come back for follow-up visit in 4 weeks for evaluation before the next cycle of his treatment. For the bone disease, he will continue his Zometa  infusion every 3 months. The patient was advised to call immediately if he has any other concerning symptoms in the interval. Disclaimer: This note was dictated with voice recognition software. Similar sounding words can inadvertently be transcribed and may be missed upon review. Sherrod MARLA Sherrod, MD

## 2024-07-01 ENCOUNTER — Inpatient Hospital Stay

## 2024-07-01 ENCOUNTER — Encounter: Payer: Self-pay | Admitting: Internal Medicine

## 2024-07-01 ENCOUNTER — Inpatient Hospital Stay: Admitting: Physician Assistant

## 2024-07-01 VITALS — BP 117/47 | HR 74 | Temp 97.0°F | Resp 16 | Wt 170.5 lb

## 2024-07-01 DIAGNOSIS — J329 Chronic sinusitis, unspecified: Secondary | ICD-10-CM | POA: Diagnosis not present

## 2024-07-01 DIAGNOSIS — C9000 Multiple myeloma not having achieved remission: Secondary | ICD-10-CM

## 2024-07-01 DIAGNOSIS — C3491 Malignant neoplasm of unspecified part of right bronchus or lung: Secondary | ICD-10-CM | POA: Diagnosis not present

## 2024-07-01 DIAGNOSIS — Z5112 Encounter for antineoplastic immunotherapy: Secondary | ICD-10-CM | POA: Diagnosis not present

## 2024-07-01 LAB — CBC WITH DIFFERENTIAL (CANCER CENTER ONLY)
Abs Immature Granulocytes: 0.05 K/uL (ref 0.00–0.07)
Basophils Absolute: 0.1 K/uL (ref 0.0–0.1)
Basophils Relative: 1 %
Eosinophils Absolute: 0.2 K/uL (ref 0.0–0.5)
Eosinophils Relative: 2 %
HCT: 41.8 % (ref 39.0–52.0)
Hemoglobin: 13.7 g/dL (ref 13.0–17.0)
Immature Granulocytes: 0 %
Lymphocytes Relative: 2 %
Lymphs Abs: 0.2 K/uL — ABNORMAL LOW (ref 0.7–4.0)
MCH: 32.2 pg (ref 26.0–34.0)
MCHC: 32.8 g/dL (ref 30.0–36.0)
MCV: 98.4 fL (ref 80.0–100.0)
Monocytes Absolute: 0.5 K/uL (ref 0.1–1.0)
Monocytes Relative: 4 %
Neutro Abs: 10.9 K/uL — ABNORMAL HIGH (ref 1.7–7.7)
Neutrophils Relative %: 91 %
Platelet Count: 157 K/uL (ref 150–400)
RBC: 4.25 MIL/uL (ref 4.22–5.81)
RDW: 15.7 % — ABNORMAL HIGH (ref 11.5–15.5)
WBC Count: 11.9 K/uL — ABNORMAL HIGH (ref 4.0–10.5)
nRBC: 0 % (ref 0.0–0.2)

## 2024-07-01 MED ORDER — DIPHENHYDRAMINE HCL 25 MG PO CAPS
50.0000 mg | ORAL_CAPSULE | Freq: Once | ORAL | Status: AC
  Administered 2024-07-01: 50 mg via ORAL
  Filled 2024-07-01: qty 2

## 2024-07-01 MED ORDER — ACETAMINOPHEN 325 MG PO TABS
650.0000 mg | ORAL_TABLET | Freq: Once | ORAL | Status: AC
  Administered 2024-07-01: 650 mg via ORAL
  Filled 2024-07-01: qty 2

## 2024-07-01 MED ORDER — DOXYCYCLINE HYCLATE 100 MG PO TABS: 100.0000 mg | ORAL_TABLET | Freq: Two times a day (BID) | ORAL | 0 refills | Status: AC

## 2024-07-01 MED ORDER — DARATUMUMAB-HYALURONIDASE-FIHJ 1800-30000 MG-UT/15ML ~~LOC~~ SOLN
1800.0000 mg | Freq: Once | SUBCUTANEOUS | Status: AC
  Administered 2024-07-01: 1800 mg via SUBCUTANEOUS
  Filled 2024-07-01: qty 15

## 2024-07-01 NOTE — Patient Instructions (Signed)

## 2024-07-01 NOTE — Progress Notes (Signed)
 Pt states he took his Dexamethasone  at 1100 today prior to appt.

## 2024-07-03 ENCOUNTER — Other Ambulatory Visit: Payer: Self-pay

## 2024-07-23 ENCOUNTER — Ambulatory Visit: Admitting: Podiatry

## 2024-07-23 DIAGNOSIS — M79675 Pain in left toe(s): Secondary | ICD-10-CM

## 2024-07-23 DIAGNOSIS — B351 Tinea unguium: Secondary | ICD-10-CM

## 2024-07-23 DIAGNOSIS — M79674 Pain in right toe(s): Secondary | ICD-10-CM | POA: Diagnosis not present

## 2024-07-23 DIAGNOSIS — Z7901 Long term (current) use of anticoagulants: Secondary | ICD-10-CM

## 2024-07-23 DIAGNOSIS — L84 Corns and callosities: Secondary | ICD-10-CM | POA: Diagnosis not present

## 2024-07-23 NOTE — Progress Notes (Signed)
 Subjective:   Patient ID: Ronald Duran, male   DOB: 83 y.o.   MRN: [id number]   HPI Chief Complaint  Patient presents with   Callouses    Rm13 Callouses and nail trim bilateral    83 year old male presents the office today with the above concerns.  He states the calluses are doing much better and not having nearly as much pain as he had been previously.  He does not report any ulcerations.  His nails are thick, elongated he cannot trim them himself and they cause discomfort.  He has no other concerns.   Review of Systems  All other systems reviewed and are negative.     Objective:  Physical Exam  General: AAO x3, NAD  Dermatological: There are minimal hyperkeratotic lesions noted right foot submetatarsal 5.  There is no significant callus formation submetatarsal 1 today.  There is no underlying ulceration, drainage or any signs of infection. Toenails are hypertrophic, dystrophic, brittle, discolored, elongated 10. No surrounding redness or drainage. Tenderness nails 1-5 bilaterally.   Vascular: Dorsalis Pedis artery and Posterior Tibial artery pedal pulses are palpable bilateral with immedate capillary fill time. Chonic lower extremity edema   Neruologic: Grossly intact via light touch bilateral.   Musculoskeletal: Bunion deformity noted with second toe overlapping the hallux.  Prominent metatarsal head.     Assessment:   Preulcerative lesion right foot; symptomatic onychomycosis.  Plan:   Skin lesion right foot - Sharply debrided hyperkeratotic lesion x 1 on the right foot with minimal bleeding.  Clean the area.  Antibiotic ointment applied followed by bandage.  Monitor for any signs or symptoms of infection.  Discussed with patient.  Symptomatic onychomycosis - Sharply debrided nails x 10 without any complications or bleeding.  Return in about 2 months (around 09/22/2024).  Donnice JONELLE Fees DPM

## 2024-07-29 ENCOUNTER — Inpatient Hospital Stay

## 2024-07-29 ENCOUNTER — Inpatient Hospital Stay: Attending: Internal Medicine

## 2024-07-29 ENCOUNTER — Inpatient Hospital Stay: Admitting: Internal Medicine

## 2024-07-29 VITALS — BP 105/71 | HR 72 | Temp 97.3°F | Resp 16 | Wt 171.7 lb

## 2024-07-29 DIAGNOSIS — C9000 Multiple myeloma not having achieved remission: Secondary | ICD-10-CM

## 2024-07-29 DIAGNOSIS — Z5112 Encounter for antineoplastic immunotherapy: Secondary | ICD-10-CM | POA: Insufficient documentation

## 2024-07-29 DIAGNOSIS — Z79620 Long term (current) use of immunosuppressive biologic: Secondary | ICD-10-CM | POA: Insufficient documentation

## 2024-07-29 LAB — CBC WITH DIFFERENTIAL (CANCER CENTER ONLY)
Abs Immature Granulocytes: 0.05 K/uL (ref 0.00–0.07)
Basophils Absolute: 0.1 K/uL (ref 0.0–0.1)
Basophils Relative: 1 %
Eosinophils Absolute: 0.3 K/uL (ref 0.0–0.5)
Eosinophils Relative: 3 %
HCT: 43.4 % (ref 39.0–52.0)
Hemoglobin: 14.4 g/dL (ref 13.0–17.0)
Immature Granulocytes: 1 %
Lymphocytes Relative: 5 %
Lymphs Abs: 0.5 K/uL — ABNORMAL LOW (ref 0.7–4.0)
MCH: 32.1 pg (ref 26.0–34.0)
MCHC: 33.2 g/dL (ref 30.0–36.0)
MCV: 96.7 fL (ref 80.0–100.0)
Monocytes Absolute: 0.9 K/uL (ref 0.1–1.0)
Monocytes Relative: 9 %
Neutro Abs: 8 K/uL — ABNORMAL HIGH (ref 1.7–7.7)
Neutrophils Relative %: 81 %
Platelet Count: 117 K/uL — ABNORMAL LOW (ref 150–400)
RBC: 4.49 MIL/uL (ref 4.22–5.81)
RDW: 16.3 % — ABNORMAL HIGH (ref 11.5–15.5)
WBC Count: 9.7 K/uL (ref 4.0–10.5)
nRBC: 0 % (ref 0.0–0.2)

## 2024-07-29 MED ORDER — ACETAMINOPHEN 325 MG PO TABS
650.0000 mg | ORAL_TABLET | Freq: Once | ORAL | Status: AC
  Administered 2024-07-29: 650 mg via ORAL
  Filled 2024-07-29: qty 2

## 2024-07-29 MED ORDER — DIPHENHYDRAMINE HCL 25 MG PO CAPS
50.0000 mg | ORAL_CAPSULE | Freq: Once | ORAL | Status: AC
  Administered 2024-07-29: 50 mg via ORAL
  Filled 2024-07-29: qty 2

## 2024-07-29 MED ORDER — DARATUMUMAB-HYALURONIDASE-FIHJ 1800-30000 MG-UT/15ML ~~LOC~~ SOLN
1800.0000 mg | Freq: Once | SUBCUTANEOUS | Status: AC
  Administered 2024-07-29: 1800 mg via SUBCUTANEOUS
  Filled 2024-07-29: qty 15

## 2024-07-29 NOTE — Progress Notes (Signed)
 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 Patrcia.  CURRENT THERAPY:  1) Systemic chemotherapy with daratumumab  subcutaneously weekly in addition to Velcade  1.3 Mg/M2 on days 1, 4, 8, 11 every 3 weeks as well as Revlimid  25 mg p.o. daily for 14 days every 3 weeks and Decadron  20 mg p.o. weekly with chemotherapy.  First cycle expected on 12/05/2022.  Status post 23 cycles.  Velcade  and Revlimid  were discontinued secondary to significant peripheral neuropathy and pancytopenia.  He is currently on maintenance treatment with daratumumab  every 4 weeks in addition to Decadron  20 mg p.o. on weekly basis. 2) Zometa  4 mg IV infusion every 4 weeks.  Starting March 12, 2024 Zometa  will be giving every 12 weeks  INTERVAL HISTORY: Ronald Duran 83 y.o. male returns to the clinic today for follow-up visit. Discussed the use of AI scribe software for clinical note transcription with the patient, who gave verbal consent to proceed.  History of Present Illness Ronald Duran is an 83 year old male with multiple myeloma who presents for evaluation prior to cycle 24 of daratumumab  and Decadron .  He is currently receiving systemic chemotherapy for multiple myeloma, with daratumumab  and Decadron  administered every four weeks. Velcade  and Revlimid  were discontinued after 23 cycles due to peripheral neuropathy. He also receives Zemaira infusions every twelve weeks.  He recently had a faintly positive home COVID-19 test following exposure to his wife's confirmed infection. He describes mild  upper respiratory symptoms including rhinorrhea with clear mucus and intermittent cough, but denies fever or any significant change in his overall condition.  He is scheduled for cataract surgery on January 5th and discussed delaying his Decadron  dose until the day after surgery as advised.    MEDICAL HISTORY: Past Medical History:  Diagnosis Date   Atrial fibrillation (HCC)    CAD (coronary artery disease)    a. s/p MI with LAD stent placement December, 1999 -last stress test November 2008 with no EKG evidence of ischemia  b. cath 07/23/2015 after abnormal ETT and nuc, occluded mid LAD stent with good distal collateral, medical therapy    Cardiomyopathy (HCC)    Ischemic cardiomyopathy with EF 40% by echo 2005   COVID 2021   mild case   Dyslipidemia    Erectile dysfunction    GERD (gastroesophageal reflux disease)    Hiatal hernia    with esophageal strictures and dilatations in the past - Dr. Princella Nida   History of nuclear stress test    Myoview 12/16: EF 47%, anterior, anteroseptal and inferoseptal defect suggesting infarct with peri-infarct ischemia, intermediate risk   HTN (hypertension)    Hx of seasonal allergies    Hyperlipidemia    Myocardial infarction (HCC) 08/01/1998   PAD (peripheral artery disease)    Pre-diabetes    Prostate cancer (HCC)    with seed implant therapy following Lupron therapy in 2008 and 2009 - followed by Dr. Alline   Ringworm    Stroke (HCC) 04/20/2017   seen on a MRI   Vitamin D deficiency     ALLERGIES:  has  no known allergies.  MEDICATIONS:  Current Outpatient Medications  Medication Sig Dispense Refill   acyclovir  (ZOVIRAX ) 200 MG capsule TAKE ONE CAPSULE BY MOUTH TWICE DAILY 60 capsule 5   apixaban  (ELIQUIS ) 5 MG TABS tablet Take 1 tablet (5 mg total) by mouth 2 (two) times daily. 60 tablet 0   atorvastatin  (LIPITOR) 40 MG tablet Take 40 mg by mouth at bedtime. (Patient not taking: Reported on 07/23/2024)     atorvastatin  (LIPITOR)  40 MG tablet 1 tablet Orally Once a day; Duration: 90 days     B Complex Vitamins (B COMPLEX 100) tablet Take 1 tablet by mouth daily. Balance     cetirizine  (ZYRTEC  ALLERGY) 10 MG tablet Take 1 tablet (10 mg total) by mouth daily. 30 tablet 0   Cholecalciferol (VITAMIN D3) 2000 UNITS TABS Take 2,000 Units by mouth daily.     ciclopirox  (PENLAC ) 8 % solution Apply topically at bedtime. Apply over nail and surrounding skin. Apply daily over previous coat. After seven (7) days, may remove with alcohol and continue cycle. (Patient not taking: Reported on 07/23/2024) 6.6 mL 2   clindamycin (CLEOCIN T) 1 % lotion Apply 1 % topically 2 (two) times daily.     Coenzyme Q10 (CO Q-10) 100 MG CAPS Take 100 mg by mouth daily with supper.     cyanocobalamin (VITAMIN B12) 1000 MCG tablet Take 2,500 mcg by mouth daily. (Patient not taking: Reported on 07/23/2024)     cyanocobalamin (VITAMIN B12) 1000 MCG tablet Take 2 tablets by mouth daily.     dexamethasone  (DECADRON ) 4 MG tablet TAKE 5 tabS weekly, start with the first dose of chemotherapy. 40 tablet 3   doxycycline  (VIBRA -TABS) 100 MG tablet Take 1 tablet (100 mg total) by mouth 2 (two) times daily. 20 tablet 0   fluticasone (CUTIVATE) 0.05 % cream Apply 1 Application topically 2 (two) times daily. As needed     furosemide  (LASIX ) 40 MG tablet Take 0.5 tablets (20 mg total) by mouth daily as needed (swelling in legs/feet or weight gain of 3+ lbs overnight or 5 lbs in 1 week.).     ketoconazole (NIZORAL) 2 % cream Apply 1 Application topically daily as needed for irritation.     metoprolol  tartrate (LOPRESSOR ) 25 MG tablet Take 1 tablet (25 mg total) by mouth 2 (two) times daily. 90 tablet 3   Multiple Vitamins-Minerals (CENTRUM) tablet Take 1 tablet by mouth daily.     nitroGLYCERIN  (NITROSTAT ) 0.4 MG SL tablet PLACE 1 TABLET UNDER THE TONGUE IF NEEDED EVERY 5 MINUTES FOR CHEST PAIN FOR 3 DOSES IF NO RELIEF AFTER FIRST DOSE CALL 911. 25 tablet 9   potassium  chloride SA (KLOR-CON  M) 20 MEQ tablet Take 1 tablet (20 mEq total) by mouth daily. 30 tablet 11   terbinafine (LAMISIL) 250 MG tablet Take 250 mg by mouth daily. (Patient not taking: Reported on 07/23/2024)     No current facility-administered medications for this visit.    SURGICAL HISTORY:  Past Surgical History:  Procedure Laterality Date   BRONCHIAL BIOPSY  11/15/2022   Procedure: BRONCHIAL BIOPSIES;  Surgeon: Brenna Adine CROME, DO;  Location: [facility];  Service: Pulmonary;;   BRONCHIAL BRUSHINGS  11/15/2022   Procedure: BRONCHIAL BRUSHINGS;  Surgeon: Brenna Adine CROME, DO;  Location: [facility];  Service: Pulmonary;;   BRONCHIAL NEEDLE ASPIRATION BIOPSY  11/15/2022   Procedure: BRONCHIAL NEEDLE ASPIRATION BIOPSIES;  Surgeon: Brenna Adine CROME, DO;  Location: [facility];  Service: Pulmonary;;   CARDIAC  CATHETERIZATION N/A 07/23/2015   Procedure: Left Heart Cath and Coronary Angiography;  Surgeon: Victory LELON Sharps, MD;  Location: [facility] INVASIVE CV LAB;  Service: Cardiovascular;  Laterality: N/A;   CARDIOVERSION N/A 06/03/2022   Procedure: CARDIOVERSION;  Surgeon: Okey Vina GAILS, MD;  Location: [facility] ENDOSCOPY;  Service: Cardiovascular;  Laterality: N/A;   COLONOSCOPY WITH PROPOFOL  N/A 11/24/2015   Procedure: COLONOSCOPY WITH PROPOFOL ;  Surgeon: Gladis MARLA Louder, MD;  Location: [facility] ENDOSCOPY;  Service: Endoscopy;  Laterality: N/A;   esophageal strictures with dilatations in the past per Dr. Princella Nida     last '98   FIDUCIAL MARKER PLACEMENT  11/15/2022   Procedure: FIDUCIAL MARKER PLACEMENT;  Surgeon: Brenna Adine CROME, DO;  Location: [facility];  Service: Pulmonary;;   HERNIA REPAIR Left    INTRAMEDULLARY (IM) NAIL INTERTROCHANTERIC Left 10/25/2022   Procedure: LEFT INTRAMEDULLARY (IM) NAIL INTERTROCHANTERIC;  Surgeon: Jerri Kay HERO, MD;  Location: [facility] OR;  Service: Orthopedics;  Laterality: Left;   IR BONE MARROW BIOPSY & ASPIRATION  11/17/2022   IR BONE TUMOR(S)RF ABLATION  10/11/2022   IR KYPHO  THORACIC WITH BONE BIOPSY  10/11/2022   LAD stent placement - Dr. Victory Sharps     left inguinal hernia repair - Dr. Ozell Gander     prostatic radioactive seed implantation - Dr. Jason     '09    REVIEW OF SYSTEMS:  A comprehensive review of systems was negative except for: Respiratory: positive for cough   PHYSICAL EXAMINATION: General appearance: alert, cooperative, fatigued, and no distress Head: Normocephalic, without obvious abnormality, atraumatic Neck: no adenopathy, no JVD, supple, symmetrical, trachea midline, and thyroid not enlarged, symmetric, no tenderness/mass/nodules Lymph nodes: Cervical, supraclavicular, and axillary nodes normal. Resp: clear to auscultation bilaterally Back: symmetric, no curvature. ROM normal. No CVA tenderness. Cardio: irregularly irregular rhythm GI: soft, non-tender; bowel sounds normal; no masses,  no organomegaly Extremities: extremities normal, atraumatic, no cyanosis or edema  ECOG PERFORMANCE STATUS: 1 - Symptomatic but completely ambulatory  Blood pressure 105/71, pulse 72, temperature (!) 97.3 F (36.3 C), temperature source Oral, resp. rate 16, weight 171 lb 11.2 oz (77.9 kg), SpO2 98%.  LABORATORY DATA: Lab Results  Component Value Date   WBC 9.7 07/29/2024   HGB 14.4 07/29/2024   HCT 43.4 07/29/2024   MCV 96.7 07/29/2024   PLT 117 (L) 07/29/2024      Chemistry      Component Value Date/Time   NA 139 06/20/2024 1413   NA 143 12/27/2023 0000   K 4.0 06/20/2024 1413   CL 106 06/20/2024 1413   CO2 28 06/20/2024 1413   BUN 20 06/20/2024 1413   BUN 20 12/27/2023 0000   CREATININE 1.19 06/20/2024 1413   CREATININE 1.23 (H) 07/21/2015 0917      Component Value Date/Time   CALCIUM  9.7 06/20/2024 1413   ALKPHOS 84 06/20/2024 1413   AST 25 06/20/2024 1413   ALT 33 06/20/2024 1413   BILITOT 1.0 06/20/2024 1413       RADIOGRAPHIC STUDIES: No results found.   ASSESSMENT AND PLAN: This is a very pleasant 83 years old white  male with multiple myeloma presented with multiple lytic lesions involving T10, left femoral neck as well as T4, T9 and T11 vertebral bodies. Also had right lower lobe lung nodule that was suspicious for non-small cell lung cancer. 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 The  biopsy from the left hip was negative for malignancy. His myeloma panel showed no concerning findings except for elevated IgG but the serum light chains were normal. The patient underwent left hip fixation for impending fracture. He also underwent palliative radiotherapy under the care of Dr. Patrcia. His recent bone marrow biopsy and aspirate was consistent with plasma cell neoplasm with involvement of plasma cells 25% of the bone marrow. The patient is currently on treatment with daratumumab  and dexamethasone  started on 12/05/2022 status post 23 cycles.  Velcade  and Revlimid  were discontinued secondary to adverse effect and intolerance.  He has been tolerating his treatment with subcutaneous daratumumab  and Decadron  fairly well.  Assessment and Plan Assessment & Plan Multiple myeloma He remains clinically stable on systemic chemotherapy with daratumumab  and dexamethasone , after discontinuation of bortezomib  and lenalidomide  due to chemotherapy-induced peripheral neuropathy. Hematologic parameters are within acceptable range. He is scheduled for cataract surgery and inquired about dexamethasone  timing in relation to the procedure. - Cleared to proceed with chemotherapy despite recent COVID-19 exposure, given minimal symptoms and absence of fever. - Advised to withhold dexamethasone  on the day of cataract surgery and resume the following day. - Scheduled follow-up in four weeks for next treatment cycle.  COVID-19 infection He had a faintly positive home COVID-19 test following exposure from his wife, with mild  upper respiratory symptoms (rhinorrhea, occasional cough), no fever, and no significant systemic symptoms. He remains afebrile and clinically stable. - Cleared to proceed with chemotherapy due to minimal symptoms and absence of fever.  Chemotherapy-induced peripheral neuropathy Peripheral neuropathy necessitated discontinuation of bortezomib  and lenalidomide . For the suspicious right lower lobe lung cancer, he underwent SBRT under the care of Dr. Patrcia. He was advised to call immediately if he has any concerning symptoms in the interval. The patient voices understanding of current disease status and treatment options and is in agreement with the current care plan.  All questions were answered. The patient knows to call the clinic with any problems, questions or concerns. We can certainly see the patient much sooner if necessary. The total time spent in the appointment was 20 minutes including review of chart and various tests results, discussions about plan of care and coordination of care plan .   Disclaimer: This note was dictated with voice recognition software. Similar sounding words can inadvertently be transcribed and may not be corrected upon review.        "

## 2024-07-29 NOTE — Progress Notes (Signed)
 Pt stated he tool steroid @ approx 0615.

## 2024-07-29 NOTE — Patient Instructions (Signed)

## 2024-07-30 ENCOUNTER — Ambulatory Visit: Admitting: Cardiovascular Disease

## 2024-07-30 LAB — IGG, IGA, IGM
IgA: 9 mg/dL — ABNORMAL LOW (ref 61–437)
IgG (Immunoglobin G), Serum: 501 mg/dL — ABNORMAL LOW (ref 603–1613)
IgM (Immunoglobulin M), Srm: 13 mg/dL — ABNORMAL LOW (ref 15–143)

## 2024-08-26 ENCOUNTER — Inpatient Hospital Stay: Attending: Internal Medicine

## 2024-08-26 ENCOUNTER — Inpatient Hospital Stay: Admitting: Internal Medicine

## 2024-08-26 ENCOUNTER — Other Ambulatory Visit: Payer: Self-pay | Admitting: Physician Assistant

## 2024-08-26 ENCOUNTER — Inpatient Hospital Stay

## 2024-08-26 VITALS — BP 110/78 | HR 81 | Resp 16

## 2024-08-26 VITALS — BP 120/86 | HR 83 | Temp 97.3°F | Resp 17 | Ht 72.0 in | Wt 171.6 lb

## 2024-08-26 DIAGNOSIS — Z5112 Encounter for antineoplastic immunotherapy: Secondary | ICD-10-CM | POA: Insufficient documentation

## 2024-08-26 DIAGNOSIS — C9000 Multiple myeloma not having achieved remission: Secondary | ICD-10-CM

## 2024-08-26 DIAGNOSIS — Z79620 Long term (current) use of immunosuppressive biologic: Secondary | ICD-10-CM | POA: Insufficient documentation

## 2024-08-26 LAB — CBC WITH DIFFERENTIAL (CANCER CENTER ONLY)
Abs Immature Granulocytes: 0.07 K/uL (ref 0.00–0.07)
Basophils Absolute: 0.1 K/uL (ref 0.0–0.1)
Basophils Relative: 0 %
Eosinophils Absolute: 0.1 K/uL (ref 0.0–0.5)
Eosinophils Relative: 0 %
HCT: 44.2 % (ref 39.0–52.0)
Hemoglobin: 14.8 g/dL (ref 13.0–17.0)
Immature Granulocytes: 1 %
Lymphocytes Relative: 2 %
Lymphs Abs: 0.3 K/uL — ABNORMAL LOW (ref 0.7–4.0)
MCH: 32.9 pg (ref 26.0–34.0)
MCHC: 33.5 g/dL (ref 30.0–36.0)
MCV: 98.2 fL (ref 80.0–100.0)
Monocytes Absolute: 0.3 K/uL (ref 0.1–1.0)
Monocytes Relative: 2 %
Neutro Abs: 13.6 K/uL — ABNORMAL HIGH (ref 1.7–7.7)
Neutrophils Relative %: 95 %
Platelet Count: 117 K/uL — ABNORMAL LOW (ref 150–400)
RBC: 4.5 MIL/uL (ref 4.22–5.81)
RDW: 16.6 % — ABNORMAL HIGH (ref 11.5–15.5)
WBC Count: 14.4 K/uL — ABNORMAL HIGH (ref 4.0–10.5)
nRBC: 0 % (ref 0.0–0.2)

## 2024-08-26 LAB — CMP (CANCER CENTER ONLY)
ALT: 38 U/L (ref 0–44)
AST: 26 U/L (ref 15–41)
Albumin: 4.1 g/dL (ref 3.5–5.0)
Alkaline Phosphatase: 90 U/L (ref 38–126)
Anion gap: 10 (ref 5–15)
BUN: 18 mg/dL (ref 8–23)
CO2: 29 mmol/L (ref 22–32)
Calcium: 9.7 mg/dL (ref 8.9–10.3)
Chloride: 104 mmol/L (ref 98–111)
Creatinine: 1.26 mg/dL — ABNORMAL HIGH (ref 0.61–1.24)
GFR, Estimated: 57 mL/min — ABNORMAL LOW
Glucose, Bld: 94 mg/dL (ref 70–99)
Potassium: 3.9 mmol/L (ref 3.5–5.1)
Sodium: 142 mmol/L (ref 135–145)
Total Bilirubin: 1.1 mg/dL (ref 0.0–1.2)
Total Protein: 5.8 g/dL — ABNORMAL LOW (ref 6.5–8.1)

## 2024-08-26 MED ORDER — DARATUMUMAB-HYALURONIDASE-FIHJ 1800-30000 MG-UT/15ML ~~LOC~~ SOLN
1800.0000 mg | Freq: Once | SUBCUTANEOUS | Status: AC
  Administered 2024-08-26: 1800 mg via SUBCUTANEOUS
  Filled 2024-08-26: qty 15

## 2024-08-26 MED ORDER — ACETAMINOPHEN 325 MG PO TABS
650.0000 mg | ORAL_TABLET | Freq: Once | ORAL | Status: AC
  Administered 2024-08-26: 650 mg via ORAL
  Filled 2024-08-26: qty 2

## 2024-08-26 MED ORDER — DIPHENHYDRAMINE HCL 25 MG PO CAPS
50.0000 mg | ORAL_CAPSULE | Freq: Once | ORAL | Status: AC
  Administered 2024-08-26: 50 mg via ORAL
  Filled 2024-08-26: qty 2

## 2024-08-26 MED ORDER — SODIUM CHLORIDE 0.9 % IV SOLN: INTRAVENOUS | Status: DC

## 2024-08-26 MED ORDER — ZOLEDRONIC ACID 4 MG/100ML IV SOLN
4.0000 mg | Freq: Once | INTRAVENOUS | Status: AC
  Administered 2024-08-26: 4 mg via INTRAVENOUS
  Filled 2024-08-26: qty 100

## 2024-08-26 NOTE — Progress Notes (Signed)
 "     [facility] [phone]   [phone]  OFFICE PROGRESS NOTE  Ronald Righter, MD 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 Patrcia.  CURRENT THERAPY:  1) Systemic chemotherapy with daratumumab  subcutaneously weekly in addition to Velcade  1.3 Mg/M2 on days 1, 4, 8, 11 every 3 weeks as well as Revlimid  25 mg p.o. daily for 14 days every 3 weeks and Decadron  20 mg p.o. weekly with chemotherapy.  First cycle expected on 12/05/2022.  Status post 24 cycles.  Velcade  and Revlimid  were discontinued secondary to significant peripheral neuropathy and pancytopenia.  He is currently on maintenance treatment with daratumumab  every 4 weeks in addition to Decadron  20 mg p.o. on weekly basis. 2) Zometa  4 mg IV infusion every 4 weeks.  Starting March 12, 2024 Zometa  will be giving every 12 weeks  INTERVAL HISTORY: Ronald Duran 84 y.o. male returns to the clinic today for follow-up visit. Discussed the use of AI scribe software for clinical note transcription with the patient, who gave verbal consent to proceed.  History of Present Illness Ronald Duran is an 84 year old male with multiple myeloma who presents for evaluation prior to initiating cycle 25 of daratumumab  and Decadron .  He is preparing to begin cycle 25 of daratumumab  and Decadron . He denies nausea, vomiting, or diarrhea, and reports feeling unchanged from prior cycles.  His most bothersome symptom is persistent chemotherapy-induced peripheral neuropathy, which has remained stable without new or worsening neurological symptoms.  He contracted COVID-19 over the Christmas holiday but has since recovered  fully without residual symptoms.  He recently underwent bilateral cataract surgery, with the first eye operated on two weeks prior and the second four days prior to this visit. He reports significant improvement in vision and increased brightness.    MEDICAL HISTORY: Past Medical History:  Diagnosis Date   Atrial fibrillation (HCC)    CAD (coronary artery disease)    a. s/p MI with LAD stent placement December, 1999 -last stress test November 2008 with no EKG evidence of ischemia  b. cath 07/23/2015 after abnormal ETT and nuc, occluded mid LAD stent with good distal collateral, medical therapy    Cardiomyopathy (HCC)    Ischemic cardiomyopathy with EF 40% by echo 2005   COVID 2021   mild case   Dyslipidemia    Erectile dysfunction    GERD (gastroesophageal reflux disease)    Hiatal hernia    with esophageal strictures and dilatations in the past - Dr. Princella Nida   History of nuclear stress test    Myoview 12/16: EF 47%, anterior, anteroseptal and inferoseptal defect suggesting infarct with peri-infarct ischemia, intermediate risk   HTN (hypertension)    Hx of seasonal allergies    Hyperlipidemia    Myocardial infarction (HCC) 08/01/1998   PAD (peripheral artery disease)    Pre-diabetes    Prostate cancer (HCC)    with seed implant therapy following Lupron therapy in 2008 and 2009 - followed by Dr. Alline   Ringworm    Stroke (HCC) 04/20/2017   seen on a MRI   Vitamin D deficiency     ALLERGIES:  has no known allergies.  MEDICATIONS:  Current  Outpatient Medications  Medication Sig Dispense Refill   acyclovir  (ZOVIRAX ) 200 MG capsule TAKE ONE CAPSULE BY MOUTH TWICE DAILY 60 capsule 5   apixaban  (ELIQUIS ) 5 MG TABS tablet Take 1 tablet (5 mg total) by mouth 2 (two) times daily. 60 tablet 0   atorvastatin  (LIPITOR) 40 MG tablet Take 40 mg by mouth at bedtime. (Patient not taking: Reported on 07/23/2024)     atorvastatin  (LIPITOR) 40 MG tablet 1 tablet Orally Once a day;  Duration: 90 days     B Complex Vitamins (B COMPLEX 100) tablet Take 1 tablet by mouth daily. Balance     cetirizine  (ZYRTEC  ALLERGY) 10 MG tablet Take 1 tablet (10 mg total) by mouth daily. 30 tablet 0   Cholecalciferol (VITAMIN D3) 2000 UNITS TABS Take 2,000 Units by mouth daily.     ciclopirox  (PENLAC ) 8 % solution Apply topically at bedtime. Apply over nail and surrounding skin. Apply daily over previous coat. After seven (7) days, may remove with alcohol and continue cycle. (Patient not taking: Reported on 07/23/2024) 6.6 mL 2   clindamycin (CLEOCIN T) 1 % lotion Apply 1 % topically 2 (two) times daily.     Coenzyme Q10 (CO Q-10) 100 MG CAPS Take 100 mg by mouth daily with supper.     cyanocobalamin (VITAMIN B12) 1000 MCG tablet Take 2,500 mcg by mouth daily. (Patient not taking: Reported on 07/23/2024)     cyanocobalamin (VITAMIN B12) 1000 MCG tablet Take 2 tablets by mouth daily.     dexamethasone  (DECADRON ) 4 MG tablet TAKE 5 tabS weekly, start with the first dose of chemotherapy. 40 tablet 3   doxycycline  (VIBRA -TABS) 100 MG tablet Take 1 tablet (100 mg total) by mouth 2 (two) times daily. 20 tablet 0   fluticasone (CUTIVATE) 0.05 % cream Apply 1 Application topically 2 (two) times daily. As needed     furosemide  (LASIX ) 40 MG tablet Take 0.5 tablets (20 mg total) by mouth daily as needed (swelling in legs/feet or weight gain of 3+ lbs overnight or 5 lbs in 1 week.).     ketoconazole (NIZORAL) 2 % cream Apply 1 Application topically daily as needed for irritation.     metoprolol  tartrate (LOPRESSOR ) 25 MG tablet Take 1 tablet (25 mg total) by mouth 2 (two) times daily. 90 tablet 3   Multiple Vitamins-Minerals (CENTRUM) tablet Take 1 tablet by mouth daily.     nitroGLYCERIN  (NITROSTAT ) 0.4 MG SL tablet PLACE 1 TABLET UNDER THE TONGUE IF NEEDED EVERY 5 MINUTES FOR CHEST PAIN FOR 3 DOSES IF NO RELIEF AFTER FIRST DOSE CALL 911. 25 tablet 9   potassium chloride  SA (KLOR-CON  M) 20 MEQ tablet Take  1 tablet (20 mEq total) by mouth daily. 30 tablet 11   terbinafine (LAMISIL) 250 MG tablet Take 250 mg by mouth daily. (Patient not taking: Reported on 07/23/2024)     No current facility-administered medications for this visit.    SURGICAL HISTORY:  Past Surgical History:  Procedure Laterality Date   BRONCHIAL BIOPSY  11/15/2022   Procedure: BRONCHIAL BIOPSIES;  Surgeon: Brenna Adine CROME, DO;  Location: [facility];  Service: Pulmonary;;   BRONCHIAL BRUSHINGS  11/15/2022   Procedure: BRONCHIAL BRUSHINGS;  Surgeon: Brenna Adine CROME, DO;  Location: [facility];  Service: Pulmonary;;   BRONCHIAL NEEDLE ASPIRATION BIOPSY  11/15/2022   Procedure: BRONCHIAL NEEDLE ASPIRATION BIOPSIES;  Surgeon: Brenna Adine CROME, DO;  Location: [facility];  Service: Pulmonary;;   CARDIAC CATHETERIZATION N/A 07/23/2015   Procedure: Left  Heart Cath and Coronary Angiography;  Surgeon: Victory LELON Sharps, MD;  Location: [facility] INVASIVE CV LAB;  Service: Cardiovascular;  Laterality: N/A;   CARDIOVERSION N/A 06/03/2022   Procedure: CARDIOVERSION;  Surgeon: Okey Vina GAILS, MD;  Location: [facility] ENDOSCOPY;  Service: Cardiovascular;  Laterality: N/A;   COLONOSCOPY WITH PROPOFOL  N/A 11/24/2015   Procedure: COLONOSCOPY WITH PROPOFOL ;  Surgeon: Gladis MARLA Louder, MD;  Location: [facility] ENDOS[facility];  Service: Endoscopy;  Laterality: N/A;   esophageal strictures with dilatations in the past per Dr. Princella Nida     last '98   FIDUCIAL MARKER PLACEMENT  11/15/2022   Procedure: FIDUCIAL MARKER PLACEMENT;  Surgeon: Brenna Adine CROME, DO;  Location: [facility];  Service: Pulmonary;;   HERNIA REPAIR Left    INTRAMEDULLARY (IM) NAIL INTERTROCHANTERIC Left 10/25/2022   Procedure: LEFT INTRAMEDULLARY (IM) NAIL INTERTROCHANTERIC;  Surgeon: Jerri Kay HERO, MD;  Location: [facility] OR;  Service: Orthopedics;  Laterality: Left;   IR BONE MARROW BIOPSY & ASPIRATION  11/17/2022   IR BONE TUMOR(S)RF ABLATION  10/11/2022   IR KYPHO THORACIC WITH BONE BIOPSY  10/11/2022   LAD  stent placement - Dr. Victory Sharps     left inguinal hernia repair - Dr. Ozell Gander     prostatic radioactive seed implantation - Dr. Jason     '09    REVIEW OF SYSTEMS:  A comprehensive review of systems was negative.   PHYSICAL EXAMINATION: General appearance: alert, cooperative, fatigued, and no distress Head: Normocephalic, without obvious abnormality, atraumatic Neck: no adenopathy, no JVD, supple, symmetrical, trachea midline, and thyroid not enlarged, symmetric, no tenderness/mass/nodules Lymph nodes: Cervical, supraclavicular, and axillary nodes normal. Resp: clear to auscultation bilaterally Back: symmetric, no curvature. ROM normal. No CVA tenderness. Cardio: irregularly irregular rhythm GI: soft, non-tender; bowel sounds normal; no masses,  no organomegaly Extremities: extremities normal, atraumatic, no cyanosis or edema  ECOG PERFORMANCE STATUS: 1 - Symptomatic but completely ambulatory  Blood pressure 120/86, pulse 83, temperature (!) 97.3 F (36.3 C), temperature source Temporal, resp. rate 17, height 6' (1.829 m), weight 171 lb 9.6 oz (77.8 kg), SpO2 99%.  LABORATORY DATA: Lab Results  Component Value Date   WBC 9.7 07/29/2024   HGB 14.4 07/29/2024   HCT 43.4 07/29/2024   MCV 96.7 07/29/2024   PLT 117 (L) 07/29/2024      Chemistry      Component Value Date/Time   NA 139 06/20/2024 1413   NA 143 12/27/2023 0000   K 4.0 06/20/2024 1413   CL 106 06/20/2024 1413   CO2 28 06/20/2024 1413   BUN 20 06/20/2024 1413   BUN 20 12/27/2023 0000   CREATININE 1.19 06/20/2024 1413   CREATININE 1.23 (H) 07/21/2015 0917      Component Value Date/Time   CALCIUM  9.7 06/20/2024 1413   ALKPHOS 84 06/20/2024 1413   AST 25 06/20/2024 1413   ALT 33 06/20/2024 1413   BILITOT 1.0 06/20/2024 1413       RADIOGRAPHIC STUDIES: No results found.   ASSESSMENT AND PLAN: This is a very pleasant 84 years old white male with multiple myeloma presented with multiple lytic  lesions involving T10, left femoral neck as well as T4, T9 and T11 vertebral bodies. Also had right lower lobe lung nodule that was suspicious for non-small cell lung cancer. 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 The biopsy from the left hip was negative for malignancy.  His myeloma panel showed no concerning findings except for elevated IgG but the serum light chains were normal. The patient underwent left hip fixation for impending fracture. He also underwent palliative radiotherapy under the care of Dr. Patrcia. His recent bone marrow biopsy and aspirate was consistent with plasma cell neoplasm with involvement of plasma cells 25% of the bone marrow. The patient is currently on treatment with daratumumab  and dexamethasone  started on 12/05/2022 status post 24 cycles.  Velcade  and Revlimid  were discontinued secondary to adverse effect and intolerance.  He has been tolerating his treatment with subcutaneous daratumumab  and Decadron  fairly well.  Assessment and Plan Assessment & Plan Multiple myeloma Disease remains clinically stable and well-controlled on ongoing therapy with subcutaneous daratumumab  and Decadron , status post 24 cycles. No evidence of progression or significant gastrointestinal toxicity. Remission has not been achieved, but the regimen is well tolerated. He is aware of the option for treatment breaks if side effects or fatigue develop, and the need for ongoing monitoring to detect recurrence. - Proceed with cycle 25 of daratumumab  and Decadron  as scheduled. - Administered zoledronic  acid (Zometa ) today per three-month schedule. - Ordered myeloma panel to be performed one week prior to next visit. - Schedule next follow-up visit in four weeks.  Chemotherapy-induced peripheral neuropathy Chronic neuropathy persists as the most bothersome symptom related to therapy.  No new management strategies discussed at this visit. The patient was advised to call immediately if he has any concerning symptoms in the interval.   The patient voices understanding of current disease status and treatment options and is in agreement with the current care plan.  All questions were answered. The patient knows to call the clinic with any problems, questions or concerns. We can certainly see the patient much sooner if necessary. The total time spent in the appointment was 20 minutes including review of chart and various tests results, discussions about plan of care and coordination of care plan .   Disclaimer: This note was dictated with voice recognition software. Similar sounding words can inadvertently be transcribed and may not be corrected upon review.        "

## 2024-08-26 NOTE — Patient Instructions (Addendum)
 [facility] - A DEPT OF [facility]  Discharge Instructions: Thank you for choosing [facility] to provide your oncology and hematology care.   If you have a lab appointment with the Cancer Center, please go directly to the Cancer Center and check in at the registration area.   Wear comfortable clothing and clothing appropriate for easy access to any Portacath or PICC line.   We strive to give you quality time with your provider. You may need to reschedule your appointment if you arrive late (15 or more minutes).  Arriving late affects you and other patients whose appointments are after yours.  Also, if you miss three or more appointments without notifying the office, you may be dismissed from the clinic at the provider's discretion.      For prescription refill requests, have your pharmacy contact our office and allow 72 hours for refills to be completed.    Today you received the following chemotherapy and/or immunotherapy agents: Darzalex Faspro      To help prevent nausea and vomiting after your treatment, we encourage you to take your nausea medication as directed.  BELOW ARE SYMPTOMS THAT SHOULD BE REPORTED IMMEDIATELY: *FEVER GREATER THAN 100.4 F (38 C) OR HIGHER *CHILLS OR SWEATING *NAUSEA AND VOMITING THAT IS NOT CONTROLLED WITH YOUR NAUSEA MEDICATION *UNUSUAL SHORTNESS OF BREATH *UNUSUAL BRUISING OR BLEEDING *URINARY PROBLEMS (pain or burning when urinating, or frequent urination) *BOWEL PROBLEMS (unusual diarrhea, constipation, pain near the anus) TENDERNESS IN MOUTH AND THROAT WITH OR WITHOUT PRESENCE OF ULCERS (sore throat, sores in mouth, or a toothache) UNUSUAL RASH, SWELLING OR PAIN  UNUSUAL VAGINAL DISCHARGE OR ITCHING   Items with * indicate a potential emergency and should be followed up as soon as possible or go to the Emergency Department if any problems should occur.  Please show the CHEMOTHERAPY ALERT CARD or  IMMUNOTHERAPY ALERT CARD at check-in to the Emergency Department and triage nurse.  Should you have questions after your visit or need to cancel or reschedule your appointment, please contact [facility] - A DEPT OF [facility]  Dept: [phone]  and follow the prompts.  Office hours are 8:00 a.m. to 4:30 p.m. Monday - Friday. Please note that voicemails left after 4:00 p.m. may not be returned until the following business day.  We are closed weekends and major holidays. You have access to a nurse at all times for urgent questions. Please call the main number to the clinic Dept: [phone] and follow the prompts.   For any non-urgent questions, you may also contact your provider using MyChart. We now offer e-Visits for anyone 78 and older to request care online for non-urgent symptoms. For details visit [email or web address].   Also download the MyChart app! Go to the app store, search "MyChart", open the app, select [facility], and log in with your MyChart username and password.  Zoledronic Acid Injection (Cancer) What is this medication? ZOLEDRONIC ACID (ZOE le dron ik AS id) treats high calcium levels in the blood caused by cancer. It may also be used with chemotherapy to treat weakened bones caused by cancer. It works by slowing down the release of calcium from bones. This lowers calcium levels in your blood. It also makes your bones stronger and less likely to break (fracture). It belongs to a group of medications called bisphosphonates. This medicine may be used for other purposes; ask your health care  provider or pharmacist if you have questions. COMMON BRAND NAME(S): Zometa, Zometa Powder What should I tell my care team before I take this medication? They need to know if you have any of these conditions: Dehydration Dental disease Kidney disease Liver disease Low levels of calcium in the blood Lung or breathing disease, such as asthma Receiving  steroids, such as dexamethasone or prednisone An unusual or allergic reaction to zoledronic acid, other medications, foods, dyes, or preservatives Pregnant or trying to get pregnant Breast-feeding How should I use this medication? This medication is injected into a vein. It is given by your care team in a hospital or clinic setting. Talk to your care team about the use of this medication in children. Special care may be needed. Overdosage: If you think you have taken too much of this medicine contact a poison control center or emergency room at once. NOTE: This medicine is only for you. Do not share this medicine with others. What if I miss a dose? Keep appointments for follow-up doses. It is important not to miss your dose. Call your care team if you are unable to keep an appointment. What may interact with this medication? Certain antibiotics given by injection Diuretics, such as bumetanide, furosemide NSAIDs, medications for pain and inflammation, such as ibuprofen or naproxen Teriparatide Thalidomide This list may not describe all possible interactions. Give your health care provider a list of all the medicines, herbs, non-prescription drugs, or dietary supplements you use. Also tell them if you smoke, drink alcohol, or use illegal drugs. Some items may interact with your medicine. What should I watch for while using this medication? Visit your care team for regular checks on your progress. It may be some time before you see the benefit from this medication. Some people who take this medication have severe bone, joint, or muscle pain. This medication may also increase your risk for jaw problems or a broken thigh bone. Tell your care team right away if you have severe pain in your jaw, bones, joints, or muscles. Tell you care team if you have any pain that does not go away or that gets worse. Tell your dentist and dental surgeon that you are taking this medication. You should not have major  dental surgery while on this medication. See your dentist to have a dental exam and fix any dental problems before starting this medication. Take good care of your teeth while on this medication. Make sure you see your dentist for regular follow-up appointments. You should make sure you get enough calcium and vitamin D while you are taking this medication. Discuss the foods you eat and the vitamins you take with your care team. Check with your care team if you have severe diarrhea, nausea, and vomiting, or if you sweat a lot. The loss of too much body fluid may make it dangerous for you to take this medication. You may need bloodwork while taking this medication. Talk to your care team if you wish to become pregnant or think you might be pregnant. This medication can cause serious birth defects. What side effects may I notice from receiving this medication? Side effects that you should report to your care team as soon as possible: Allergic reactions--skin rash, itching, hives, swelling of the face, lips, tongue, or throat Kidney injury--decrease in the amount of urine, swelling of the ankles, hands, or feet Low calcium level--muscle pain or cramps, confusion, tingling, or numbness in the hands or feet Osteonecrosis of the jaw--pain, swelling, or  redness in the mouth, numbness of the jaw, poor healing after dental work, unusual discharge from the mouth, visible bones in the mouth Severe bone, joint, or muscle pain Side effects that usually do not require medical attention (report to your care team if they continue or are bothersome): Constipation Fatigue Fever Loss of appetite Nausea Stomach pain This list may not describe all possible side effects. Call your doctor for medical advice about side effects. You may report side effects to FDA at 1-800-FDA-1088. Where should I keep my medication? This medication is given in a hospital or clinic. It will not be stored at home. NOTE: This sheet is a  summary. It may not cover all possible information. If you have questions about this medicine, talk to your doctor, pharmacist, or health care provider.  2024 Elsevier/Gold Standard (2021-09-17 00:00:00)

## 2024-08-26 NOTE — Progress Notes (Signed)
 Patient took his steroid at about 10:30 this morning.

## 2024-08-27 ENCOUNTER — Telehealth: Payer: Self-pay | Admitting: Internal Medicine

## 2024-08-27 LAB — IGG, IGA, IGM
IgA: 15 mg/dL — ABNORMAL LOW (ref 61–437)
IgG (Immunoglobin G), Serum: 507 mg/dL — ABNORMAL LOW (ref 603–1613)
IgM (Immunoglobulin M), Srm: 23 mg/dL (ref 15–143)

## 2024-08-27 NOTE — Telephone Encounter (Signed)
 Scheduled appointments per 1/19 los. Called and left a VM with the appointment details for the patient.

## 2024-08-28 ENCOUNTER — Other Ambulatory Visit: Payer: Self-pay

## 2024-09-02 ENCOUNTER — Other Ambulatory Visit: Payer: Self-pay

## 2024-09-08 ENCOUNTER — Other Ambulatory Visit: Payer: Self-pay

## 2024-09-10 ENCOUNTER — Other Ambulatory Visit: Payer: Self-pay | Admitting: Cardiovascular Disease

## 2024-09-16 ENCOUNTER — Inpatient Hospital Stay: Attending: Internal Medicine

## 2024-09-23 ENCOUNTER — Inpatient Hospital Stay: Admitting: Internal Medicine

## 2024-09-23 ENCOUNTER — Inpatient Hospital Stay

## 2024-09-26 ENCOUNTER — Ambulatory Visit: Admitting: Podiatry

## 2024-10-09 ENCOUNTER — Ambulatory Visit: Admitting: Cardiovascular Disease

## 2024-10-21 ENCOUNTER — Inpatient Hospital Stay

## 2024-10-21 ENCOUNTER — Inpatient Hospital Stay: Admitting: Internal Medicine

## 2024-10-21 ENCOUNTER — Inpatient Hospital Stay: Attending: Internal Medicine
